# Patient Record
Sex: Male | Born: 1960 | Race: White | Hispanic: No | Marital: Married | Smoking: Never smoker
Health system: Southern US, Community
[De-identification: ages and names within clinical notes are randomized; demographics above are authoritative.]

## PROBLEM LIST (undated history)

## (undated) DIAGNOSIS — K2270 Barrett's esophagus without dysplasia: Secondary | ICD-10-CM

## (undated) DIAGNOSIS — I514 Myocarditis, unspecified: Secondary | ICD-10-CM

## (undated) DIAGNOSIS — F909 Attention-deficit hyperactivity disorder, unspecified type: Secondary | ICD-10-CM

## (undated) DIAGNOSIS — I10 Essential (primary) hypertension: Secondary | ICD-10-CM

## (undated) DIAGNOSIS — K449 Diaphragmatic hernia without obstruction or gangrene: Secondary | ICD-10-CM

## (undated) DIAGNOSIS — E785 Hyperlipidemia, unspecified: Secondary | ICD-10-CM

## (undated) DIAGNOSIS — D649 Anemia, unspecified: Secondary | ICD-10-CM

## (undated) HISTORY — PX: EYE SURGERY: SHX253

## (undated) HISTORY — PX: OTHER SURGICAL HISTORY: SHX169

## (undated) HISTORY — DX: Anemia, unspecified: D64.9

## (undated) HISTORY — PX: ABSCESS DRAIN LIVER PERC (ARMC HX): HXRAD1235

## (undated) HISTORY — PX: CARDIAC CATHETERIZATION: SHX172

## (undated) HISTORY — PX: RETINAL DETACHMENT SURGERY: SHX105

## (undated) HISTORY — DX: Hyperlipidemia, unspecified: E78.5

## (undated) HISTORY — DX: Myocarditis, unspecified: I51.4

## (undated) HISTORY — DX: Barrett's esophagus without dysplasia: K22.70

## (undated) HISTORY — DX: Diaphragmatic hernia without obstruction or gangrene: K44.9

## (undated) HISTORY — DX: Essential (primary) hypertension: I10

## (undated) HISTORY — PX: HYDROCELE EXCISION / REPAIR: SUR1145

## (undated) HISTORY — DX: Attention-deficit hyperactivity disorder, unspecified type: F90.9

---

## 1998-09-30 ENCOUNTER — Ambulatory Visit (HOSPITAL_BASED_OUTPATIENT_CLINIC_OR_DEPARTMENT_OTHER): Admission: RE | Admit: 1998-09-30 | Discharge: 1998-09-30 | Payer: Self-pay | Admitting: Urology

## 1999-04-28 ENCOUNTER — Inpatient Hospital Stay (HOSPITAL_COMMUNITY): Admission: EM | Admit: 1999-04-28 | Discharge: 1999-04-30 | Payer: Self-pay | Admitting: Emergency Medicine

## 1999-04-28 ENCOUNTER — Encounter: Payer: Self-pay | Admitting: Emergency Medicine

## 2003-10-31 ENCOUNTER — Ambulatory Visit (HOSPITAL_COMMUNITY): Admission: RE | Admit: 2003-10-31 | Discharge: 2003-11-01 | Payer: Self-pay | Admitting: Ophthalmology

## 2003-12-16 ENCOUNTER — Emergency Department (HOSPITAL_COMMUNITY): Admission: EM | Admit: 2003-12-16 | Discharge: 2003-12-16 | Payer: Self-pay | Admitting: Emergency Medicine

## 2003-12-26 ENCOUNTER — Encounter: Payer: Self-pay | Admitting: Internal Medicine

## 2004-01-14 ENCOUNTER — Encounter (INDEPENDENT_AMBULATORY_CARE_PROVIDER_SITE_OTHER): Payer: Self-pay | Admitting: *Deleted

## 2004-01-14 ENCOUNTER — Ambulatory Visit (HOSPITAL_COMMUNITY): Admission: RE | Admit: 2004-01-14 | Discharge: 2004-01-14 | Payer: Self-pay | Admitting: Internal Medicine

## 2004-01-14 ENCOUNTER — Encounter: Payer: Self-pay | Admitting: Internal Medicine

## 2004-03-31 ENCOUNTER — Ambulatory Visit: Payer: Self-pay | Admitting: Internal Medicine

## 2004-04-08 ENCOUNTER — Ambulatory Visit: Payer: Self-pay

## 2005-08-03 ENCOUNTER — Ambulatory Visit: Payer: Self-pay | Admitting: Internal Medicine

## 2006-05-09 ENCOUNTER — Ambulatory Visit: Payer: Self-pay | Admitting: Internal Medicine

## 2006-05-09 LAB — CONVERTED CEMR LAB
ALT: 31 units/L (ref 0–40)
AST: 24 units/L (ref 0–37)
Albumin: 3.5 g/dL (ref 3.5–5.2)
Alkaline Phosphatase: 51 units/L (ref 39–117)
BUN: 21 mg/dL (ref 6–23)
CO2: 31 meq/L (ref 19–32)
Calcium: 8.7 mg/dL (ref 8.4–10.5)
Chloride: 100 meq/L (ref 96–112)
Chol/HDL Ratio, serum: 4.9
Cholesterol: 138 mg/dL (ref 0–200)
Creatinine, Ser: 1 mg/dL (ref 0.4–1.5)
Creatinine,U: 168.1 mg/dL
GFR calc non Af Amer: 86 mL/min
Glomerular Filtration Rate, Af Am: 104 mL/min/{1.73_m2}
Glucose, Bld: 111 mg/dL — ABNORMAL HIGH (ref 70–99)
HCT: 41.1 % (ref 39.0–52.0)
HDL: 28.4 mg/dL — ABNORMAL LOW (ref >39.0)
Hemoglobin: 13.7 g/dL (ref 13.0–17.0)
Hgb A1c MFr Bld: 7.5 % — ABNORMAL HIGH (ref 4.6–6.0)
LDL Cholesterol: 82 mg/dL (ref 0–99)
MCHC: 33.2 g/dL (ref 30.0–36.0)
MCV: 90.6 fL (ref 78.0–100.0)
Microalb Creat Ratio: 2.4 mg/g (ref 0.0–30.0)
Microalb, Ur: 0.4 mg/dL (ref 0.0–1.9)
Platelets: 347 10*3/uL (ref 150–400)
Potassium: 3.9 meq/L (ref 3.5–5.1)
RBC: 4.54 M/uL (ref 4.22–5.81)
RDW: 13.1 % (ref 11.5–14.6)
Sodium: 139 meq/L (ref 135–145)
TSH: 1.48 microintl units/mL (ref 0.35–5.50)
Total Bilirubin: 0.5 mg/dL (ref 0.3–1.2)
Total Protein: 6.6 g/dL (ref 6.0–8.3)
Triglyceride fasting, serum: 136 mg/dL (ref 0–149)
VLDL: 27 mg/dL (ref 0–40)
WBC: 9.8 10*3/uL (ref 4.5–10.5)

## 2006-07-14 ENCOUNTER — Ambulatory Visit: Payer: Self-pay | Admitting: Internal Medicine

## 2006-11-02 ENCOUNTER — Telehealth: Payer: Self-pay | Admitting: Internal Medicine

## 2006-11-03 DIAGNOSIS — I10 Essential (primary) hypertension: Secondary | ICD-10-CM | POA: Insufficient documentation

## 2006-11-14 ENCOUNTER — Encounter (INDEPENDENT_AMBULATORY_CARE_PROVIDER_SITE_OTHER): Payer: Self-pay | Admitting: *Deleted

## 2007-01-13 ENCOUNTER — Telehealth: Payer: Self-pay | Admitting: Internal Medicine

## 2007-02-06 ENCOUNTER — Encounter (INDEPENDENT_AMBULATORY_CARE_PROVIDER_SITE_OTHER): Payer: Self-pay | Admitting: *Deleted

## 2007-03-13 ENCOUNTER — Ambulatory Visit: Payer: Self-pay | Admitting: Internal Medicine

## 2007-03-13 DIAGNOSIS — IMO0002 Reserved for concepts with insufficient information to code with codable children: Secondary | ICD-10-CM | POA: Insufficient documentation

## 2007-03-13 DIAGNOSIS — E1165 Type 2 diabetes mellitus with hyperglycemia: Secondary | ICD-10-CM

## 2007-03-13 DIAGNOSIS — D239 Other benign neoplasm of skin, unspecified: Secondary | ICD-10-CM | POA: Insufficient documentation

## 2007-03-13 DIAGNOSIS — E1151 Type 2 diabetes mellitus with diabetic peripheral angiopathy without gangrene: Secondary | ICD-10-CM

## 2007-03-31 ENCOUNTER — Encounter (INDEPENDENT_AMBULATORY_CARE_PROVIDER_SITE_OTHER): Payer: Self-pay | Admitting: *Deleted

## 2007-04-04 ENCOUNTER — Encounter: Payer: Self-pay | Admitting: Internal Medicine

## 2007-04-05 ENCOUNTER — Ambulatory Visit: Payer: Self-pay | Admitting: Internal Medicine

## 2007-04-05 DIAGNOSIS — I428 Other cardiomyopathies: Secondary | ICD-10-CM

## 2007-04-07 LAB — CONVERTED CEMR LAB
ALT: 32 units/L (ref 0–53)
AST: 22 units/L (ref 0–37)
Albumin: 3.4 g/dL — ABNORMAL LOW (ref 3.5–5.2)
Alkaline Phosphatase: 65 units/L (ref 39–117)
BUN: 16 mg/dL (ref 6–23)
Basophils Absolute: 0.1 10*3/uL (ref 0.0–0.1)
Basophils Relative: 0.8 % (ref 0.0–1.0)
Bilirubin, Direct: 0.1 mg/dL (ref 0.0–0.3)
CO2: 30 meq/L (ref 19–32)
Calcium: 9.2 mg/dL (ref 8.4–10.5)
Chloride: 104 meq/L (ref 96–112)
Cholesterol: 121 mg/dL (ref 0–200)
Creatinine, Ser: 0.9 mg/dL (ref 0.4–1.5)
Creatinine,U: 139.5 mg/dL
Eosinophils Absolute: 0.5 10*3/uL (ref 0.0–0.6)
Eosinophils Relative: 5.9 % — ABNORMAL HIGH (ref 0.0–5.0)
GFR calc Af Amer: 117 mL/min
GFR calc non Af Amer: 97 mL/min
Glucose, Bld: 164 mg/dL — ABNORMAL HIGH (ref 70–99)
HCT: 39.5 % (ref 39.0–52.0)
HDL: 25 mg/dL — ABNORMAL LOW (ref >39.0)
Hemoglobin: 13.5 g/dL (ref 13.0–17.0)
Hgb A1c MFr Bld: 8.3 % — ABNORMAL HIGH (ref 4.6–6.0)
Iron: 71 ug/dL (ref 42–165)
LDL Cholesterol: 77 mg/dL (ref 0–99)
Lymphocytes Relative: 24.4 % (ref 12.0–46.0)
MCHC: 34.1 g/dL (ref 30.0–36.0)
MCV: 88.3 fL (ref 78.0–100.0)
Microalb Creat Ratio: 2.9 mg/g (ref 0.0–30.0)
Microalb, Ur: 0.4 mg/dL (ref 0.0–1.9)
Monocytes Absolute: 0.6 10*3/uL (ref 0.2–0.7)
Monocytes Relative: 7.6 % (ref 3.0–11.0)
Neutro Abs: 4.9 10*3/uL (ref 1.4–7.7)
Neutrophils Relative %: 61.3 % (ref 43.0–77.0)
Platelets: 296 10*3/uL (ref 150–400)
Potassium: 4 meq/L (ref 3.5–5.1)
RBC: 4.47 M/uL (ref 4.22–5.81)
RDW: 14.2 % (ref 11.5–14.6)
Sodium: 143 meq/L (ref 135–145)
TSH: 1.36 microintl units/mL (ref 0.35–5.50)
Total Bilirubin: 0.5 mg/dL (ref 0.3–1.2)
Total CHOL/HDL Ratio: 4.8
Total Protein: 6.7 g/dL (ref 6.0–8.3)
Triglycerides: 94 mg/dL (ref 0–149)
VLDL: 19 mg/dL (ref 0–40)
WBC: 8.1 10*3/uL (ref 4.5–10.5)

## 2007-06-08 ENCOUNTER — Ambulatory Visit: Payer: Self-pay | Admitting: Internal Medicine

## 2007-06-20 ENCOUNTER — Encounter (INDEPENDENT_AMBULATORY_CARE_PROVIDER_SITE_OTHER): Payer: Self-pay | Admitting: *Deleted

## 2007-06-21 ENCOUNTER — Telehealth (INDEPENDENT_AMBULATORY_CARE_PROVIDER_SITE_OTHER): Payer: Self-pay | Admitting: *Deleted

## 2007-07-04 ENCOUNTER — Ambulatory Visit: Payer: Self-pay | Admitting: Internal Medicine

## 2007-07-07 ENCOUNTER — Telehealth (INDEPENDENT_AMBULATORY_CARE_PROVIDER_SITE_OTHER): Payer: Self-pay | Admitting: *Deleted

## 2007-07-07 LAB — CONVERTED CEMR LAB: Hgb A1c MFr Bld: 8.4 % — ABNORMAL HIGH (ref 4.6–6.0)

## 2007-07-19 ENCOUNTER — Telehealth (INDEPENDENT_AMBULATORY_CARE_PROVIDER_SITE_OTHER): Payer: Self-pay | Admitting: *Deleted

## 2007-07-26 ENCOUNTER — Encounter: Payer: Self-pay | Admitting: Internal Medicine

## 2007-08-10 ENCOUNTER — Telehealth (INDEPENDENT_AMBULATORY_CARE_PROVIDER_SITE_OTHER): Payer: Self-pay | Admitting: *Deleted

## 2007-08-10 ENCOUNTER — Emergency Department (HOSPITAL_COMMUNITY): Admission: EM | Admit: 2007-08-10 | Discharge: 2007-08-10 | Payer: Self-pay | Admitting: Emergency Medicine

## 2007-08-14 ENCOUNTER — Ambulatory Visit: Payer: Self-pay | Admitting: Internal Medicine

## 2007-08-14 DIAGNOSIS — F909 Attention-deficit hyperactivity disorder, unspecified type: Secondary | ICD-10-CM | POA: Insufficient documentation

## 2007-08-30 ENCOUNTER — Ambulatory Visit: Payer: Self-pay | Admitting: *Deleted

## 2007-08-30 ENCOUNTER — Encounter: Payer: Self-pay | Admitting: Internal Medicine

## 2007-09-07 ENCOUNTER — Telehealth: Payer: Self-pay | Admitting: Internal Medicine

## 2007-10-03 ENCOUNTER — Ambulatory Visit: Payer: Self-pay | Admitting: Internal Medicine

## 2007-10-10 LAB — CONVERTED CEMR LAB
BUN: 17 mg/dL (ref 6–23)
CO2: 30 meq/L (ref 19–32)
Calcium: 8.9 mg/dL (ref 8.4–10.5)
Chloride: 104 meq/L (ref 96–112)
Creatinine, Ser: 1.1 mg/dL (ref 0.4–1.5)
GFR calc Af Amer: 93 mL/min
GFR calc non Af Amer: 77 mL/min
Glucose, Bld: 193 mg/dL — ABNORMAL HIGH (ref 70–99)
Hgb A1c MFr Bld: 8 % — ABNORMAL HIGH (ref 4.6–6.0)
Potassium: 3.4 meq/L — ABNORMAL LOW (ref 3.5–5.1)
Sodium: 141 meq/L (ref 135–145)

## 2007-11-06 ENCOUNTER — Telehealth (INDEPENDENT_AMBULATORY_CARE_PROVIDER_SITE_OTHER): Payer: Self-pay | Admitting: *Deleted

## 2008-02-22 ENCOUNTER — Telehealth: Payer: Self-pay | Admitting: Internal Medicine

## 2008-02-22 ENCOUNTER — Telehealth (INDEPENDENT_AMBULATORY_CARE_PROVIDER_SITE_OTHER): Payer: Self-pay | Admitting: *Deleted

## 2008-03-19 ENCOUNTER — Telehealth (INDEPENDENT_AMBULATORY_CARE_PROVIDER_SITE_OTHER): Payer: Self-pay | Admitting: *Deleted

## 2008-04-29 ENCOUNTER — Telehealth (INDEPENDENT_AMBULATORY_CARE_PROVIDER_SITE_OTHER): Payer: Self-pay | Admitting: *Deleted

## 2008-04-29 ENCOUNTER — Ambulatory Visit: Payer: Self-pay | Admitting: Family Medicine

## 2008-07-01 ENCOUNTER — Telehealth (INDEPENDENT_AMBULATORY_CARE_PROVIDER_SITE_OTHER): Payer: Self-pay | Admitting: *Deleted

## 2008-07-25 ENCOUNTER — Encounter (INDEPENDENT_AMBULATORY_CARE_PROVIDER_SITE_OTHER): Payer: Self-pay | Admitting: *Deleted

## 2008-09-03 ENCOUNTER — Encounter (INDEPENDENT_AMBULATORY_CARE_PROVIDER_SITE_OTHER): Payer: Self-pay | Admitting: *Deleted

## 2008-09-10 ENCOUNTER — Telehealth (INDEPENDENT_AMBULATORY_CARE_PROVIDER_SITE_OTHER): Payer: Self-pay | Admitting: *Deleted

## 2008-10-07 ENCOUNTER — Encounter (INDEPENDENT_AMBULATORY_CARE_PROVIDER_SITE_OTHER): Payer: Self-pay | Admitting: *Deleted

## 2008-10-10 ENCOUNTER — Telehealth (INDEPENDENT_AMBULATORY_CARE_PROVIDER_SITE_OTHER): Payer: Self-pay | Admitting: *Deleted

## 2008-10-15 ENCOUNTER — Encounter (INDEPENDENT_AMBULATORY_CARE_PROVIDER_SITE_OTHER): Payer: Self-pay | Admitting: *Deleted

## 2008-10-22 ENCOUNTER — Encounter (INDEPENDENT_AMBULATORY_CARE_PROVIDER_SITE_OTHER): Payer: Self-pay | Admitting: *Deleted

## 2008-10-30 ENCOUNTER — Telehealth (INDEPENDENT_AMBULATORY_CARE_PROVIDER_SITE_OTHER): Payer: Self-pay | Admitting: *Deleted

## 2008-11-04 ENCOUNTER — Ambulatory Visit: Payer: Self-pay | Admitting: Internal Medicine

## 2008-11-04 DIAGNOSIS — E785 Hyperlipidemia, unspecified: Secondary | ICD-10-CM

## 2008-11-11 ENCOUNTER — Telehealth (INDEPENDENT_AMBULATORY_CARE_PROVIDER_SITE_OTHER): Payer: Self-pay | Admitting: *Deleted

## 2008-11-14 ENCOUNTER — Encounter: Payer: Self-pay | Admitting: Internal Medicine

## 2009-02-13 ENCOUNTER — Telehealth (INDEPENDENT_AMBULATORY_CARE_PROVIDER_SITE_OTHER): Payer: Self-pay | Admitting: *Deleted

## 2009-02-13 ENCOUNTER — Ambulatory Visit: Payer: Self-pay | Admitting: Internal Medicine

## 2009-05-20 ENCOUNTER — Telehealth: Payer: Self-pay | Admitting: Internal Medicine

## 2009-05-21 ENCOUNTER — Telehealth (INDEPENDENT_AMBULATORY_CARE_PROVIDER_SITE_OTHER): Payer: Self-pay | Admitting: *Deleted

## 2009-07-22 ENCOUNTER — Ambulatory Visit: Payer: Self-pay | Admitting: Internal Medicine

## 2009-07-24 ENCOUNTER — Ambulatory Visit: Payer: Self-pay | Admitting: Internal Medicine

## 2009-07-28 LAB — CONVERTED CEMR LAB
ALT: 25 units/L (ref 0–53)
AST: 25 units/L (ref 0–37)
BUN: 16 mg/dL (ref 6–23)
CO2: 30 meq/L (ref 19–32)
Calcium: 8.7 mg/dL (ref 8.4–10.5)
Chloride: 102 meq/L (ref 96–112)
Cholesterol: 153 mg/dL (ref 0–200)
Creatinine, Ser: 0.7 mg/dL (ref 0.4–1.5)
Creatinine,U: 293.3 mg/dL
GFR calc non Af Amer: 127.69 mL/min (ref >60)
Glucose, Bld: 134 mg/dL — ABNORMAL HIGH (ref 70–99)
HDL: 41.8 mg/dL (ref >39.00)
Hgb A1c MFr Bld: 7.1 % — ABNORMAL HIGH (ref 4.6–6.5)
LDL Cholesterol: 81 mg/dL (ref 0–99)
Microalb Creat Ratio: 7.8 mg/g (ref 0.0–30.0)
Microalb, Ur: 2.3 mg/dL — ABNORMAL HIGH (ref 0.0–1.9)
Potassium: 3.6 meq/L (ref 3.5–5.1)
Sodium: 138 meq/L (ref 135–145)
Total CHOL/HDL Ratio: 4
Triglycerides: 150 mg/dL — ABNORMAL HIGH (ref 0.0–149.0)
VLDL: 30 mg/dL (ref 0.0–40.0)

## 2009-10-22 ENCOUNTER — Ambulatory Visit: Payer: Self-pay | Admitting: Internal Medicine

## 2009-10-23 ENCOUNTER — Encounter (INDEPENDENT_AMBULATORY_CARE_PROVIDER_SITE_OTHER): Payer: Self-pay | Admitting: *Deleted

## 2009-11-04 ENCOUNTER — Telehealth (INDEPENDENT_AMBULATORY_CARE_PROVIDER_SITE_OTHER): Payer: Self-pay | Admitting: *Deleted

## 2009-11-06 ENCOUNTER — Encounter (INDEPENDENT_AMBULATORY_CARE_PROVIDER_SITE_OTHER): Payer: Self-pay | Admitting: *Deleted

## 2009-11-06 ENCOUNTER — Ambulatory Visit: Payer: Self-pay | Admitting: Internal Medicine

## 2009-11-07 ENCOUNTER — Ambulatory Visit: Payer: Self-pay | Admitting: Internal Medicine

## 2009-11-07 LAB — CONVERTED CEMR LAB
Basophils Absolute: 0.1 10*3/uL (ref 0.0–0.1)
Basophils Relative: 1 % (ref 0–1)
Eosinophils Absolute: 0.5 10*3/uL (ref 0.0–0.7)
Eosinophils Relative: 5 % (ref 0–5)
HCT: 36.2 % — ABNORMAL LOW (ref 39.0–52.0)
Hemoglobin: 11.4 g/dL — ABNORMAL LOW (ref 13.0–17.0)
Hgb A1c MFr Bld: 7.1 % — ABNORMAL HIGH (ref <5.7)
Lymphocytes Relative: 25 % (ref 12–46)
Lymphs Abs: 2.7 10*3/uL (ref 0.7–4.0)
MCHC: 31.5 g/dL (ref 30.0–36.0)
MCV: 81.3 fL (ref 78.0–100.0)
Monocytes Absolute: 0.6 10*3/uL (ref 0.1–1.0)
Monocytes Relative: 5 % (ref 3–12)
Neutro Abs: 6.8 10*3/uL (ref 1.7–7.7)
Neutrophils Relative %: 64 % (ref 43–77)
Platelets: 361 10*3/uL (ref 150–400)
RBC: 4.45 M/uL (ref 4.22–5.81)
RDW: 16.4 % — ABNORMAL HIGH (ref 11.5–15.5)
WBC: 10.5 10*3/uL (ref 4.0–10.5)

## 2009-11-12 ENCOUNTER — Telehealth (INDEPENDENT_AMBULATORY_CARE_PROVIDER_SITE_OTHER): Payer: Self-pay | Admitting: *Deleted

## 2009-11-13 ENCOUNTER — Ambulatory Visit: Payer: Self-pay | Admitting: Internal Medicine

## 2009-11-13 LAB — CONVERTED CEMR LAB
Basophils Absolute: 0 10*3/uL (ref 0.0–0.1)
Basophils Relative: 0.4 % (ref 0.0–3.0)
Eosinophils Absolute: 0.5 10*3/uL (ref 0.0–0.7)
Iron: 36 ug/dL — ABNORMAL LOW (ref 42–165)
Lymphocytes Relative: 34.4 % (ref 12.0–46.0)
MCHC: 32.7 g/dL (ref 30.0–36.0)
MCV: 80.3 fL (ref 78.0–100.0)
Monocytes Absolute: 0.7 10*3/uL (ref 0.1–1.0)
Neutrophils Relative %: 51.1 % (ref 43.0–77.0)
RBC: 4.34 M/uL (ref 4.22–5.81)
RDW: 17.5 % — ABNORMAL HIGH (ref 11.5–14.6)

## 2009-11-16 ENCOUNTER — Telehealth (INDEPENDENT_AMBULATORY_CARE_PROVIDER_SITE_OTHER): Payer: Self-pay | Admitting: *Deleted

## 2009-11-16 DIAGNOSIS — D509 Iron deficiency anemia, unspecified: Secondary | ICD-10-CM | POA: Insufficient documentation

## 2009-11-18 ENCOUNTER — Encounter (INDEPENDENT_AMBULATORY_CARE_PROVIDER_SITE_OTHER): Payer: Self-pay | Admitting: *Deleted

## 2009-12-11 ENCOUNTER — Encounter: Payer: Self-pay | Admitting: Internal Medicine

## 2009-12-30 ENCOUNTER — Telehealth: Payer: Self-pay | Admitting: Internal Medicine

## 2010-01-12 ENCOUNTER — Telehealth: Payer: Self-pay | Admitting: Internal Medicine

## 2010-02-09 ENCOUNTER — Telehealth: Payer: Self-pay | Admitting: Internal Medicine

## 2010-04-09 ENCOUNTER — Telehealth: Payer: Self-pay | Admitting: Internal Medicine

## 2010-06-23 NOTE — Procedures (Signed)
Summary: EGD   EGD  Procedure date:  01/14/2004  Findings:      Location: [facility]   Patient Name: Raymond Pugh, Raymond Pugh MRN: [id number] Procedure Procedures: Panendoscopy (EGD) CPT: 43235.    with multiple biopsies. CPT: T6462574. There were greater than 10 biopsies taken.    with [facility] Dilation of the Esophagus Personnel: Endoscopist: Iva Boop, MD, [facility].  Exam Location: Exam performed in Endoscopy Suite. Outpatient  Patient Consent: Procedure, Alternatives, Risks and Benefits discussed, consent obtained,  Indications Symptoms: Dysphagia.  History  Current Medications: Patient is not currently taking Coumadin.  Pre-Exam Physical: Performed Jan 14, 2004  Cardio-pulmonary exam, HEENT exam, Abdominal exam, Extremity exam, Mental status exam WNL.  Exam Exam Info: Maximum depth of insertion Duodenum, intended Duodenum. Patient position: on left side. Gastric retroflexion performed. Images taken. ASA Classification: III. Tolerance: excellent.  Sedation Meds: Patient assessed and found to be appropriate for moderate (conscious) sedation. Cetacaine Spray 2 sprays given aerosolized. Versed 8 mg. given IV. Fentanyl 100 mcg. given IV.  Monitoring: BP and pulse monitoring done. Oximetry used. Supplemental O2 given  Fluoroscopy: Fluoroscopy was not used.  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 Proximal margin 26 cm from mouth,  Z Line 38 cm from mouth, Length of Barrett's 12 cm. No inflammation present. A retroflex image was taken. Biopsy/Barrett's taken. ICD9: Barrett's: 530.2. Comment: 4 quadrant biopsies every 2 cm from 38 to 26 cm.  - Dilation: Distal Esophagus. for dysphagia without stricture. Maloney dilator used, Diameter: 18 mm, Minimal Resistance, Minimal Heme present on extraction. Patient tolerance excellent.  HIATAL HERNIA: Diaphragm 40 cm from mouth. Z-line/GE Junction 38 cm from mouth. 2 cms. in length. ICD9: Hernia, Hiatal: 553.3. -  Normal: Fundus to Duodenal 2nd Portion.   Assessment Abnormal examination, see findings above.  Diagnoses: 530.2: Barrett's.  553.3: Hernia, Hiatal.   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 NO OBVIOUS STRICTURE SEEN. HIS HEARTBURN IS GONE ON PROTONIX 40 MG/DAY Events  Unplanned Intervention: No unplanned interventions were required.  Plans Medication(s): Continue current medications.  Patient Education: Patient given standard instructions for: Barrett's. Reflux.  Comments: CONTINUE PROTONIX  Disposition: After procedure patient sent to recovery.  Scheduling: Call office for appointment, to Iva Boop, MD, [facility], 4 WEEKS    cc: The Patient     Delorse Lek, MD    This report was created from the original endoscopy report, which was reviewed and signed by the above listed endoscopist.

## 2010-06-23 NOTE — Progress Notes (Signed)
Summary: refill  Phone Note Refill Request Call back at [phone] Message from:  Fax from Pharmacy on [facility] [address]   Refills Requested: Medication #1:  ALPRAZOLAM 1 MG  TABS 1/2 to 1 by mouth once daily as needed anxiety   Last Refilled: 07/22/2009 Initial call taken by: Army Fossa CMA,  December 30, 2009 12:42 PM  Follow-up for Phone Call        ok 90 and 1 RF Jose E. Paz MD  December 30, 2009 4:01 PM     Prescriptions: ALPRAZOLAM 1 MG  TABS (ALPRAZOLAM) 1/2 to 1 by mouth once daily as needed anxiety  #90 x 1   Entered by:   Army Fossa CMA   Authorized by:   Nolon Rod. Paz MD   Signed by:   Army Fossa CMA on 12/30/2009   Method used:   Printed then faxed to ...       [facility] [address] #[id number]* (retail)       [address]       [place], [place]  [address]       Ph: [phone]       Fax: [phone]   RxID:   [id number]

## 2010-06-23 NOTE — Assessment & Plan Note (Signed)
Summary: roa per pt..lh   Vital Signs:  Patient profile:   50 year old male Height:      71 inches Weight:      249 pounds BMI:     34.85 Pulse rate:   76 / minute BP sitting:   122 / 80  Vitals Entered By: Shary Decamp (July 22, 2009 2:55 PM) CC: rov Comments  - blood sugar average at home 80-120  - ? increase adderall Shary Decamp  July 22, 2009 3:03 PM    History of Present Illness: we change several of his medications to generic at the time of the last office visit here for follow-up, doing well  Hypertension--  rarely gets ambulatory BPs but usually ok  High Cholesterol-- good medication compliance , no s/e that he can tell   Diabetes-- good medication compliance , diet improved, lost wt    ADHD-feel like his symptoms are not completely well controlled, still easily distracted; increase dose of medication?  Current Medications (verified): 1)  Fenofibrate Micronized 134 Mg Caps (Fenofibrate Micronized) .Marland Kitchen.. 1 By Mouth Once Daily 2)  Pravachol 40 Mg Tabs (Pravastatin Sodium) .Marland Kitchen.. 1 By Mouth Once Daily 3)  Glipizide 5 Mg Tb24 (Glipizide) .... Take 1 Tablet By Mouth Twice A Day 4)  Glucophage 500 Mg Tabs (Metformin Hcl) .... 2 By Mouth Two Times A Day 5)  Januvia 100 Mg Tabs (Sitagliptin Phosphate) .Marland Kitchen.. 1 By Mouth Qd 6)  Actos 45 Mg Tabs (Pioglitazone Hcl) .... Take 1 Tablet By Mouth Once A Day 7)  Altace 10 Mg Caps (Ramipril) .Marland Kitchen.. 1 By Mouth Two Times A Day 8)  Hydrochlorothiazide 25 Mg Tabs (Hydrochlorothiazide) .Marland Kitchen.. 1 By Mouth Once Daily 9)  Viagra 50 Mg Tabs (Sildenafil Citrate) 10)  Omeprazole 40 Mg Cpdr (Omeprazole) .Marland Kitchen.. 1 By Mouth Once Daily 11)  Alprazolam 1 Mg  Tabs (Alprazolam) .... 1/2 To 1 By Mouth Once Daily As Needed Anxiety 12)  Metoprolol Tartrate 100 Mg  Tabs (Metoprolol Tartrate) .Marland Kitchen.. 1 By Mouth Bid 13)  Adderall Xr 30 Mg  Cp24 (Amphetamine-Dextroamphetamine) .Marland Kitchen.. 1 By Mouth A Day  As Needed  Allergies (verified): No Known Drug Allergies  Past  History:  Past Medical History: Hypertension High Cholesterol Diabetes mellitus, type II Radiokeratotomy 50 y/o  retinal detachment , s/p repair 2008  Hyperlipidemia  Past Surgical History: Reviewed history from 11/04/2008 and no changes required. Hydrocele repair Radiokeratotomy 50 y/o  retinal detachment , s/p repair 2008   Family History:  Diabetes-- F  Hypertension-- F M  Family History Other cancer-Bone Family History of Cardiovascular disorder colon ca--no prostate ca--no  Social History: Reviewed history from 11/04/2008 and no changes required. Married Alcohol use--yes-occ Tobacco-- no 4  kids currently unemployed going to school school [employer]  Review of Systems CV:  Denies chest pain or discomfort and swelling of feet. Resp:  Denies cough and shortness of breath. GI:  Denies bloody stools.  Physical Exam  General:  alert and well-developed.   Lungs:  normal respiratory effort, no intercostal retractions, no accessory muscle use, and normal breath sounds.   Heart:  normal rate, regular rhythm, and no murmur.   Pulses:  normal pedal pulses bilaterally Extremities:  no extremity edema  Diabetes Management Exam:    Foot Exam (with socks and/or shoes not present):       Sensory-Pinprick/Light touch:          Left medial foot (L-4): normal  Left dorsal foot (L-5): normal          Left lateral foot (S-1): normal          Right medial foot (L-4): normal          Right dorsal foot (L-5): normal          Right lateral foot (S-1): normal       Sensory-Monofilament:          Left foot: normal          Right foot: normal       Inspection:          Left foot: normal          Right foot: normal       Nails:          Left foot: too long          Right foot: too long   Impression & Recommendations:  Problem # 1:  HYPERLIPIDEMIA (ICD-272.4) on generics, apparently doing well labs His updated medication list for this problem includes:    Fenofibrate  Micronized 134 Mg Caps (Fenofibrate micronized) .Marland Kitchen... 1 by mouth once daily    Pravachol 40 Mg Tabs (Pravastatin sodium) .Marland Kitchen... 1 by mouth once daily  Labs Reviewed: SGOT: 22 (04/05/2007)   SGPT: 32 (04/05/2007)   HDL:25.0 (04/05/2007), 28.4 (05/09/2006)  LDL:77 (04/05/2007), 82 (05/09/2006)  Chol:121 (04/05/2007), 138 (05/09/2006)  Trig:94 (04/05/2007), 136 (05/09/2006)  Problem # 2:  ADHD (ICD-314.01) increased dose from adderall 30 to  40 mg daily  Problem # 3:  DIABETES MELLITUS, TYPE II (ICD-250.00) apparently well controlled feet and eye care  discussed labs prraised for losing weight and eating better His updated medication list for this problem includes:    Glipizide 5 Mg Tb24 (Glipizide) .Marland Kitchen... Take 1 tablet by mouth twice a day    Glucophage 500 Mg Tabs (Metformin hcl) .Marland Kitchen... 2 by mouth two times a day    Januvia 100 Mg Tabs (Sitagliptin phosphate) .Marland Kitchen... 1 by mouth qd    Actos 45 Mg Tabs (Pioglitazone hcl) .Marland Kitchen... Take 1 tablet by mouth once a day    Altace 10 Mg Caps (Ramipril) .Marland Kitchen... 1 by mouth two times a day  Labs Reviewed: Creat: 1.1 (10/03/2007)    Reviewed HgBA1c results: 8.0 (10/03/2007)  8.4 (07/04/2007)  Problem # 4:  HYPERTENSION (ICD-401.9) well-controlled His updated medication list for this problem includes:    Altace 10 Mg Caps (Ramipril) .Marland Kitchen... 1 by mouth two times a day    Hydrochlorothiazide 25 Mg Tabs (Hydrochlorothiazide) .Marland Kitchen... 1 by mouth once daily    Metoprolol Tartrate 100 Mg Tabs (Metoprolol tartrate) .Marland Kitchen... 1 by mouth bid  BP today: 122/80 Prior BP: 112/70 (11/04/2008)  Labs Reviewed: K+: 3.4 (10/03/2007) Creat: : 1.1 (10/03/2007)   Chol: 121 (04/05/2007)   HDL: 25.0 (04/05/2007)   LDL: 77 (04/05/2007)   TG: 94 (04/05/2007)  Problem # 5:  he comes for office visit sporadically strongly encouraged to come back in 3 months and have his labs done   Complete Medication List: 1)  Fenofibrate Micronized 134 Mg Caps (Fenofibrate micronized) .Marland Kitchen.. 1 by  mouth once daily 2)  Pravachol 40 Mg Tabs (Pravastatin sodium) .Marland Kitchen.. 1 by mouth once daily 3)  Glipizide 5 Mg Tb24 (Glipizide) .... Take 1 tablet by mouth twice a day 4)  Glucophage 500 Mg Tabs (Metformin hcl) .... 2 by mouth two times a day 5)  Januvia 100 Mg Tabs (Sitagliptin phosphate) .Marland Kitchen.. 1 by mouth qd  6)  Actos 45 Mg Tabs (Pioglitazone hcl) .... Take 1 tablet by mouth once a day 7)  Altace 10 Mg Caps (Ramipril) .Marland Kitchen.. 1 by mouth two times a day 8)  Hydrochlorothiazide 25 Mg Tabs (Hydrochlorothiazide) .Marland Kitchen.. 1 by mouth once daily 9)  Viagra 50 Mg Tabs (Sildenafil citrate) 10)  Omeprazole 40 Mg Cpdr (Omeprazole) .Marland Kitchen.. 1 by mouth once daily 11)  Alprazolam 1 Mg Tabs (Alprazolam) .... 1/2 to 1 by mouth once daily as needed anxiety 12)  Metoprolol Tartrate 100 Mg Tabs (Metoprolol tartrate) .Marland Kitchen.. 1 by mouth bid 13)  Adderall Xr 20 Mg Xr24h-cap (Amphetamine-dextroamphetamine) .... 2  by mouth daily  Patient Instructions: 1)  came back fasting: 2)  FLP AST ALT  Dx High chol 3)  A1C microalb.  dx DM 4)  BMP  dx HTN 5)  Please schedule a follow-up appointment in 3 months .  Prescriptions: ADDERALL XR 20 MG XR24H-CAP (AMPHETAMINE-DEXTROAMPHETAMINE) 2  by mouth daily  #60 x 0   Entered and Authorized by:   Nolon Rod. Keyontay Stolz MD   Signed by:   Nolon Rod. Aleysha Meckler MD on 07/22/2009   Method used:   Print then Give to Patient   RxID:   [id number] METOPROLOL TARTRATE 100 MG  TABS (METOPROLOL TARTRATE) 1 by mouth bid  #180 x 1   Entered by:   Shary Decamp   Authorized by:   Nolon Rod. Curley Hogen MD   Signed by:   Shary Decamp on 07/22/2009   Method used:   Print then Give to Patient   RxID:   [id number] ALPRAZOLAM 1 MG  TABS (ALPRAZOLAM) 1/2 to 1 by mouth once daily as needed anxiety  #90 x 0   Entered by:   Shary Decamp   Authorized by:   Nolon Rod. Nolin Grell MD   Signed by:   Shary Decamp on 07/22/2009   Method used:   Print then Give to Patient   RxID:   [id number] OMEPRAZOLE 40 MG CPDR (OMEPRAZOLE) 1 by mouth  once daily  #90 x 1   Entered by:   Shary Decamp   Authorized by:   Nolon Rod. Emre Stock MD   Signed by:   Shary Decamp on 07/22/2009   Method used:   Print then Give to Patient   RxID:   [id number] HYDROCHLOROTHIAZIDE 25 MG TABS (HYDROCHLOROTHIAZIDE) 1 by mouth once daily  #90 x 1   Entered by:   Shary Decamp   Authorized by:   Nolon Rod. Liesl Simons MD   Signed by:   Shary Decamp on 07/22/2009   Method used:   Print then Give to Patient   RxID:   [id number] ALTACE 10 MG CAPS (RAMIPRIL) 1 by mouth two times a day  #180 x 1   Entered by:   Shary Decamp   Authorized by:   Nolon Rod. Pacey Altizer MD   Signed by:   Shary Decamp on 07/22/2009   Method used:   Print then Give to Patient   RxID:   [id number] ACTOS 45 MG TABS (PIOGLITAZONE HCL) Take 1 tablet by mouth once a day  #90 x 1   Entered by:   Shary Decamp   Authorized by:   Nolon Rod. Laylynn Campanella MD   Signed by:   Shary Decamp on 07/22/2009   Method used:   Print then Give to Patient   RxID:   [id number] JANUVIA 100 MG TABS (SITAGLIPTIN PHOSPHATE) 1 by mouth qd  #90 x 1   Entered by:  Shary Decamp   Authorized by:   Nolon Rod. Vignesh Willert MD   Signed by:   Shary Decamp on 07/22/2009   Method used:   Print then Give to Patient   RxID:   [id number] GLUCOPHAGE 500 MG TABS (METFORMIN HCL) 2 by mouth two times a day  #360 x 1   Entered by:   Shary Decamp   Authorized by:   Nolon Rod. Laneya Gasaway MD   Signed by:   Shary Decamp on 07/22/2009   Method used:   Print then Give to Patient   RxID:   [id number] GLIPIZIDE 5 MG TB24 (GLIPIZIDE) Take 1 tablet by mouth twice a day  #180 x 1   Entered by:   Shary Decamp   Authorized by:   Nolon Rod. Ladaysha Soutar MD   Signed by:   Shary Decamp on 07/22/2009   Method used:   Print then Give to Patient   RxID:   [id number] PRAVACHOL 40 MG TABS (PRAVASTATIN SODIUM) 1 by mouth once daily  #90 x 1   Entered by:   Shary Decamp   Authorized by:   Nolon Rod. Claudius Mich MD   Signed by:   Shary Decamp on 07/22/2009   Method used:   Print  then Give to Patient   RxID:   [id number] FENOFIBRATE MICRONIZED 134 MG CAPS (FENOFIBRATE MICRONIZED) 1 by mouth once daily  #90 x 1   Entered by:   Shary Decamp   Authorized by:   Nolon Rod. Jlynn Langille MD   Signed by:   Shary Decamp on 07/22/2009   Method used:   Print then Give to Patient   RxID:   [id number]

## 2010-06-23 NOTE — Progress Notes (Signed)
Summary: Labs  Phone Note Outgoing Call   Summary of Call: advise patient He has developed iron deficiency anemia, definitely needs to be evaluated by GI.  Please arrange a referral His diabetes is almost at goal.  Increase glipizide 5 mg to 2 tablets twice a day Jose E. Paz MD  November 16, 2009 10:51 AM   left message to call office................Marland KitchenFelecia Deloach CMA  November 17, 2009 11:58 AM   Follow-up for Phone Call        Pt is aware. Army Fossa CMA  November 18, 2009 3:54 PM   New Problems: ANEMIA (ICD-285.9)   New Problems: ANEMIA (ICD-285.9) New/Updated Medications: GLIPIZIDE 5 MG TB24 (GLIPIZIDE) 2 tabs by mouth two times a day. Prescriptions: GLIPIZIDE 5 MG TB24 (GLIPIZIDE) 2 tabs by mouth two times a day.  #120 x 0   Entered by:   Army Fossa CMA   Authorized by:   Nolon Rod. Paz MD   Signed by:   Army Fossa CMA on 11/18/2009   Method used:   Electronically to        [facility] #[id number]* (retail)       [address]       [place], [place]  [address]       Ph: [phone]       Fax: [phone]   RxID:   [id number]

## 2010-06-23 NOTE — Letter (Signed)
Summary: [place] No Show Letter  [facility] [place]  [address] [place], [place] [address]   Phone: [phone]  Fax: [phone]    10/23/2009 MRN: [id number]  [facility] [address] [place], [place]  [address]   Dear Mr. Temples,   Our records indicate that you missed your scheduled appointment with Dr. Drue Novel on 10-22-09 @ 9:20am.  Please contact this office to reschedule your appointment as soon as possible.  It is important that you keep your scheduled appointments with your physician, so we can provide you the best care possible.  Please be advised that there may be a charge for "no show" appointments.    Sincerely,    at [facility]

## 2010-06-23 NOTE — Progress Notes (Signed)
Summary: Alternative med-cannot afford  Phone Note Call from Patient Call back at Home Phone [phone] Call back at [phone](wife)   Caller: Spouse Summary of Call: Pts wife called and states that he is unemployed and they cannot afford the Actos. Would like to know if there is an alternative for him? Army Fossa CMA  February 09, 2010 10:09 AM   Follow-up for Phone Call        discontinue Actos increase glipizide to 10 mg ---2 tablets in the morning one in the afternoon , call #90, 3 refills Watch for low sugars office visit in 2 months Jose E. Paz MD  February 09, 2010 4:41 PM   Additional Follow-up for Phone Call Additional follow up Details #1::        Pt is aware of change, aware to watch for low BS.  Additional Follow-up by: Army Fossa CMA,  February 09, 2010 4:47 PM    New/Updated Medications: GLIPIZIDE 10 MG TABS (GLIPIZIDE) 2 tablets by mouth in the am, 1 tablet by mouth in the afternoon. Prescriptions: GLIPIZIDE 10 MG TABS (GLIPIZIDE) 2 tablets by mouth in the am, 1 tablet by mouth in the afternoon.  #90 x 3   Entered by:   Army Fossa CMA   Authorized by:   Nolon Rod. Paz MD   Signed by:   Army Fossa CMA on 02/09/2010   Method used:   Electronically to        [facility] #[id number]* (retail)       [address]       [place], [place]  [address]       Ph: [phone]       Fax: [phone]   RxID:   [id number]

## 2010-06-23 NOTE — Progress Notes (Signed)
Summary: REFILL   Phone Note Refill Request Message from:  Fax from Pharmacy on January 12, 2010 4:39 PM  Refills Requested: Medication #1:  ALPRAZOLAM 1 MG  TABS 1/2 to 1 by mouth once daily as needed anxiety [facility] [address] [facility] [id number]  Initial call taken by: Okey Regal [place],  January 12, 2010 4:39 PM  Follow-up for Phone Call        we just did [place] E. Paz MD  January 13, 2010 12:43 PM   Additional Follow-up for Phone Call Additional follow up Details #1::        Left message for pt to call back. Army Fossa CMA  January 13, 2010 12:52 PM  [email or web address] Barban  January 14, 2010 11:38 AM     Additional Follow-up for Phone Call Additional follow up Details #2::    Pt is aware- he will pick up rx from Pharmacy. Army Fossa CMA  January 15, 2010 11:51 AM  he also states he lost his Glipized bottle will send in new rx. Army Fossa CMA  January 15, 2010 11:52 AM    Prescriptions: GLIPIZIDE 5 MG TB24 (GLIPIZIDE) 2 tabs by mouth two times a day.  #120 x 0   Entered by:   Army Fossa CMA   Authorized by:   Nolon Rod. Paz MD   Signed by:   Army Fossa CMA on 01/15/2010   Method used:   Electronically to        [facility] #[id number]* (retail)       [address]       [place], [place]  [address]       Ph: [phone]       Fax: [phone]   RxID:   [id number]

## 2010-06-23 NOTE — Letter (Signed)
Summary: New Patient letter  [facility]  [address] [place], [place] [address]   Phone: [phone]  Fax: [phone]       11/18/2009 MRN: [id number]  Raymond Pugh [address] [place], [place]  [address]  Dear Raymond Pugh,  Welcome to the Gastroenterology Division at [facility].    You are scheduled to see Dr.  Jarold Motto on 12-12-09 at 10:00a.m. on the 3rd floor at [facility], [address]  We ask that you try to arrive at our office 15 minutes prior to your appointment time to allow for check-in.  We would like you to complete the enclosed self-administered evaluation form prior to your visit and bring it with you on the day of your appointment.  We will review it with you.  Also, please bring a complete list of all your medications or, if you prefer, bring the medication bottles and we will list them.  Please bring your insurance card so that we may make a copy of it.  If your insurance requires a referral to see a specialist, please bring your referral form from your primary care physician.  Co-payments are due at the time of your visit and may be paid by cash, check or credit card.     Your office visit will consist of a consult with your physician (includes a physical exam), any laboratory testing he/she may order, scheduling of any necessary diagnostic testing (e.g. x-ray, ultrasound, CT-scan), and scheduling of a procedure (e.g. Endoscopy, Colonoscopy) if required.  Please allow enough time on your schedule to allow for any/all of these possibilities.    If you cannot keep your appointment, please call [phone] to cancel or reschedule prior to your appointment date.  This allows [place] the opportunity to schedule an appointment for another patient in need of care.  If you do not cancel or reschedule by 5 p.m. the business day prior to your appointment date, you will be charged a $50.00 late cancellation/no-show fee.    Thank you for  choosing [facility] for your medical needs.  We appreciate the opportunity to care for you.  Please visit [place] at our website  to learn more about our practice.                     Sincerely,                                                             The Gastroenterology Division

## 2010-06-23 NOTE — Assessment & Plan Note (Signed)
Summary: 3 month roa/cdj   Vital Signs:  Patient profile:   50 year old male Height:      71 inches Weight:      249 pounds Temp:     97.8 degrees F oral Pulse rate:   83 / minute BP sitting:   110 / 68  (left arm)  Vitals Entered By: Jeremy Johann CMA (November 07, 2009 4:02 PM) CC: 3 month Comments --refills REVIEWED MED LIST, PATIENT AGREED DOSE AND INSTRUCTION CORRECT    History of Present Illness: routine office visit feels well  diet still good active , still going to school    Allergies (verified): No Known Drug Allergies  Past History:  Past Medical History: Reviewed history from 07/22/2009 and no changes required. Hypertension High Cholesterol Diabetes mellitus, type II Radiokeratotomy 50 y/o  retinal detachment , s/p repair 2008  Hyperlipidemia  Past Surgical History: Reviewed history from 11/04/2008 and no changes required. Hydrocele repair Radiokeratotomy 50 y/o  retinal detachment , s/p repair 2008   Social History: Reviewed history from 07/22/2009 and no changes required. Married Alcohol use--yes-occ Tobacco-- no 4  kids currently unemployed going to school school [employer]  Review of Systems       Hypertension-- ambulatory BPs great  High Cholesterol-- good medication compliance  Diabetes-- no recent ambulatory CBGs , one time he took double meds (by mistake) and his CBG was 65 , he was symptomatic. no other episodes     Physical Exam  General:  alert, well-developed, and overweight-appearing.   Lungs:  normal respiratory effort, no intercostal retractions, no accessory muscle use, and normal breath sounds.   Heart:  normal rate, regular rhythm, and no murmur.   Extremities:  no extremity edema   Impression & Recommendations:  Problem # 1:  ADHD (ICD-314.01) symptoms well controlled, he takes Adderall XR 20 twice a day, on the weekends usually takes one. I did a prescription for 60 but then he asked for a 90 day supply. I destroy the  prescription for 60 tablets.  Problem # 2:  DIABETES MELLITUS, TYPE II (ICD-250.00) due for labs Samples of Januvia and Actos provided His updated medication list for this problem includes:    Glipizide 5 Mg Tb24 (Glipizide) .Marland Kitchen... 2 tablets every am, 1 tablet every pm    Glucophage 500 Mg Tabs (Metformin hcl) .Marland Kitchen... 2 by mouth two times a day    Januvia 100 Mg Tabs (Sitagliptin phosphate) .Marland Kitchen... 1 by mouth qd    Actos 45 Mg Tabs (Pioglitazone hcl) .Marland Kitchen... Take 1 tablet by mouth once a day    Altace 10 Mg Caps (Ramipril) .Marland Kitchen... 1 by mouth two times a day  Labs Reviewed: Creat: 0.7 (07/24/2009)    Reviewed HgBA1c results: 7.1 (07/24/2009)  8.0 (10/03/2007)  Problem # 3:  HYPERLIPIDEMIA (ICD-272.4) well controlled the last time we checked His updated medication list for this problem includes:    Fenofibrate Micronized 134 Mg Caps (Fenofibrate micronized) .Marland Kitchen... 1 by mouth once daily    Pravachol 40 Mg Tabs (Pravastatin sodium) .Marland Kitchen... 1 by mouth once daily  Labs Reviewed: SGOT: 25 (07/24/2009)   SGPT: 25 (07/24/2009)   HDL:41.80 (07/24/2009), 25.0 (04/05/2007)  LDL:81 (07/24/2009), 77 (04/05/2007)  Chol:153 (07/24/2009), 121 (04/05/2007)  Trig:150.0 (07/24/2009), 94 (04/05/2007)  Problem # 4:  HYPERTENSION (ICD-401.9)  under excellent control His updated medication list for this problem includes:    Altace 10 Mg Caps (Ramipril) .Marland Kitchen... 1 by mouth two times a day    Hydrochlorothiazide 25  Mg Tabs (Hydrochlorothiazide) .Marland Kitchen... 1 by mouth once daily    Metoprolol Tartrate 100 Mg Tabs (Metoprolol tartrate) .Marland Kitchen... 1 by mouth bid  BP today: 110/68 Prior BP: 122/80 (07/22/2009)  Labs Reviewed: K+: 3.6 (07/24/2009) Creat: : 0.7 (07/24/2009)   Chol: 153 (07/24/2009)   HDL: 41.80 (07/24/2009)   LDL: 81 (07/24/2009)   TG: 150.0 (07/24/2009)  Orders: Venipuncture (06269)  Complete Medication List: 1)  Fenofibrate Micronized 134 Mg Caps (Fenofibrate micronized) .Marland Kitchen.. 1 by mouth once daily 2)   Pravachol 40 Mg Tabs (Pravastatin sodium) .Marland Kitchen.. 1 by mouth once daily 3)  Glipizide 5 Mg Tb24 (Glipizide) .... 2 tablets every am, 1 tablet every pm 4)  Glucophage 500 Mg Tabs (Metformin hcl) .... 2 by mouth two times a day 5)  Januvia 100 Mg Tabs (Sitagliptin phosphate) .Marland Kitchen.. 1 by mouth qd 6)  Actos 45 Mg Tabs (Pioglitazone hcl) .... Take 1 tablet by mouth once a day 7)  Altace 10 Mg Caps (Ramipril) .Marland Kitchen.. 1 by mouth two times a day 8)  Hydrochlorothiazide 25 Mg Tabs (Hydrochlorothiazide) .Marland Kitchen.. 1 by mouth once daily 9)  Viagra 50 Mg Tabs (Sildenafil citrate) 10)  Omeprazole 40 Mg Cpdr (Omeprazole) .Marland Kitchen.. 1 by mouth once daily 11)  Alprazolam 1 Mg Tabs (Alprazolam) .... 1/2 to 1 by mouth once daily as needed anxiety 12)  Metoprolol Tartrate 100 Mg Tabs (Metoprolol tartrate) .Marland Kitchen.. 1 by mouth bid 13)  Adderall Xr 20 Mg Xr24h-cap (Amphetamine-dextroamphetamine) .... 2  by mouth daily And  Patient Instructions: 1)  Please schedule a follow-up appointment in 4 months .  Prescriptions: ADDERALL XR 20 MG XR24H-CAP (AMPHETAMINE-DEXTROAMPHETAMINE) 2  by mouth daily  #180 x 0   Entered and Authorized by:   Nolon Rod. Mahkai Fangman MD   Signed by:   Nolon Rod. Sahasra Belue MD on 11/07/2009   Method used:   Print then Give to Patient   RxID:   [id number] ADDERALL XR 20 MG XR24H-CAP (AMPHETAMINE-DEXTROAMPHETAMINE) 2  by mouth daily  #60 x 0   Entered and Authorized by:   Nolon Rod. Refugia Laneve MD   Signed by:   Nolon Rod. Wallis Vancott MD on 11/07/2009   Method used:   Print then Give to Patient   RxID:   [id number]

## 2010-06-23 NOTE — Progress Notes (Signed)
Summary: ADD LABS  Phone Note Outgoing Call   Details for Reason: PLEASE CALL PT TO SCHEDULE LAB  APPT FOR LABS IRON AND FERRITIN DX ANEMIA.Marland KitchenFelecia Deloach CMA  November 12, 2009 3:13 PM    Follow-up for Phone Call        Appointment scheduled 11/13/09 @ 2PM. Follow-up by: Lavell Islam,  November 12, 2009 3:45 PM

## 2010-06-23 NOTE — Progress Notes (Signed)
Summary: RX  Phone Note Refill Request Call back at [phone]--PT Message from:  Patient on April 09, 2010 11:45 AM  Refills Requested: Medication #1:  ALPRAZOLAM 1 MG  TABS 1/2 to 1 by mouth once daily as needed anxiety   Dosage confirmed as above?Dosage Confirmed   Supply Requested: 3 months [facility] [address] PLEASE FILL TODAY PT IS OUT.  Initial call taken by: Freddy Jaksch,  April 09, 2010 11:46 AM  Follow-up for Phone Call        [facility] patient, please advise. Lucious Groves CMA  April 09, 2010 2:19 PM   Additional Follow-up for Phone Call Additional follow up Details #1::        I can only Rx 30 pills , not 90 in Dr Leta Jungling absence Additional Follow-up by: Marga Melnick MD,  April 09, 2010 2:37 PM    Additional Follow-up for Phone Call Additional follow up Details #2::    Patient notified. Lucious Groves CMA  April 09, 2010 3:41 PM   Prescriptions: ALPRAZOLAM 1 MG  TABS (ALPRAZOLAM) 1/2 to 1 by mouth once daily as needed anxiety  #30 x 0   Entered by:   Lucious Groves CMA   Authorized by:   Marga Melnick MD   Signed by:   Lucious Groves CMA on 04/09/2010   Method used:   Telephoned to ...       [facility] [address] #[id number]* (retail)       [address]       [place], [place]  [address]       Ph: [phone]       Fax: [phone]   RxID:   [id number]

## 2010-06-23 NOTE — Letter (Signed)
Summary: [place] No Show Letter  [facility] [place]  [address] [place], [place] [address]   Phone: [phone]  Fax: [phone]    11/06/2009 MRN: [id number]  Raymond Pugh [address] [place], [place]  [address]   Dear [name],   Our records indicate that you missed your scheduled appointment with __Dr. Paz__ on ___6-16-11.  Please contact this office to reschedule your appointment as soon as possible.  It is important that you keep your scheduled appointments with your physician, so we can provide you the best care possible.  Please be advised that there may be a charge for "no show" appointments.    Sincerely,   [facility] [place] [facility]

## 2010-06-23 NOTE — Progress Notes (Signed)
Summary: blood sugar  Phone Note Call from Patient   Caller: Patient Summary of Call: spoke w/ patient wife says he was having some dizziness and weakness bp was 149/66 and blood sugar was at 300 patient says he hadn't taken medication but has since taken meds and is feeling better now has f/u appt scheduled thurs. to f/u ........Marland KitchenDoristine Devoid  November 04, 2009 3:36 PM

## 2010-06-29 ENCOUNTER — Telehealth: Payer: Self-pay | Admitting: Internal Medicine

## 2010-07-03 ENCOUNTER — Ambulatory Visit (INDEPENDENT_AMBULATORY_CARE_PROVIDER_SITE_OTHER): Payer: Self-pay | Admitting: Internal Medicine

## 2010-07-03 ENCOUNTER — Telehealth: Payer: Self-pay | Admitting: Family Medicine

## 2010-07-03 ENCOUNTER — Encounter: Payer: Self-pay | Admitting: Internal Medicine

## 2010-07-03 DIAGNOSIS — K2270 Barrett's esophagus without dysplasia: Secondary | ICD-10-CM | POA: Insufficient documentation

## 2010-07-03 DIAGNOSIS — D649 Anemia, unspecified: Secondary | ICD-10-CM

## 2010-07-03 DIAGNOSIS — E119 Type 2 diabetes mellitus without complications: Secondary | ICD-10-CM

## 2010-07-03 DIAGNOSIS — I10 Essential (primary) hypertension: Secondary | ICD-10-CM

## 2010-07-06 ENCOUNTER — Encounter: Payer: Self-pay | Admitting: Internal Medicine

## 2010-07-09 LAB — CONVERTED CEMR LAB
ALT: 34 units/L (ref 0–53)
BUN: 15 mg/dL (ref 6–23)
CO2: 25 meq/L (ref 19–32)
Calcium: 8.9 mg/dL (ref 8.4–10.5)
Chloride: 101 meq/L (ref 96–112)
Creatinine, Ser: 0.85 mg/dL (ref 0.40–1.50)
Hemoglobin: 11.5 g/dL — ABNORMAL LOW (ref 13.0–17.0)

## 2010-07-09 NOTE — Assessment & Plan Note (Signed)
Summary: followup---med refill///sph   Vital Signs:  Patient profile:   50 year old male Weight:      257.8 pounds BMI:     36.09 Pulse rate:   80 / minute BP sitting:   130 / 80  (left arm) Cuff size:   large  Vitals Entered By: Shonna Chock CMA (July 03, 2010 2:17 PM) CC: Renew medications    History of Present Illness: ROV needs Rfs ,feeling well  ROS Good medication compliance except for the last few days when he ran out of some of his medicines amb CBGs  from  80 to 120 blood pressure well controlled, with a normal when checked Denies any nausea, vomiting, diarrhea No dysphagia or odynophagia  Current Medications (verified): 1)  Fenofibrate Micronized 134 Mg Caps (Fenofibrate Micronized) .Marland Kitchen.. 1 By Mouth Once Daily 2)  Pravachol 40 Mg Tabs (Pravastatin Sodium) .Marland Kitchen.. 1 By Mouth Once Daily. Due For Office Visit Before Additional Refills. 3)  Glipizide 10 Mg Tabs (Glipizide) .... 2 Tablets By Mouth in The Am, 1 Tablet By Mouth in The Afternoon. 4)  Glucophage 500 Mg Tabs (Metformin Hcl) .... 2 By Mouth Two Times A Day. Due For Office Visit. 5)  Altace 10 Mg Caps (Ramipril) .Marland Kitchen.. 1 By Mouth Two Times A Day. Due For Office Visit Before Additional Refills. 6)  Hydrochlorothiazide 25 Mg Tabs (Hydrochlorothiazide) .Marland Kitchen.. 1 By Mouth Once Daily. Due For Office Visit Before Additional Refills. 7)  Viagra 50 Mg Tabs (Sildenafil Citrate) 8)  Omeprazole 40 Mg Cpdr (Omeprazole) .Marland Kitchen.. 1 By Mouth Once Daily 9)  Alprazolam 1 Mg  Tabs (Alprazolam) .... 1/2 To 1 By Mouth Once Daily As Needed Anxiety 10)  Metoprolol Tartrate 100 Mg  Tabs (Metoprolol Tartrate) .Marland Kitchen.. 1 By Mouth Bid 11)  Adderall Xr 20 Mg Xr24h-Cap (Amphetamine-Dextroamphetamine) .... 2  By Mouth Daily  Allergies (verified): No Known Drug Allergies  Past History:  Past Medical History: Hypertension High Cholesterol Diabetes mellitus, type II Radiokeratotomy 50 y/o  retinal detachment , s/p repair 2008  Hyperlipidemia  Barrett's esophagus (by EGD ,  biopsy 2005)   Past Surgical History: Reviewed history from 11/04/2008 and no changes required. Hydrocele repair Radiokeratotomy 50 y/o  retinal detachment , s/p repair 2008   Physical Exam  General:  alert, well-developed, and overweight-appearing.   Lungs:  normal respiratory effort, no intercostal retractions, no accessory muscle use, and normal breath sounds.   Heart:  normal rate, regular rhythm, and no murmur.   Abdomen:  soft, non-tender, no distention, no masses, no guarding, and no rigidity.     Impression & Recommendations:  Problem # 1:  ANEMIA (ICD-285.9) the patient was found to have iron deficiency anemia, was referred to GI but did not go He is currently uninsure He has a history of Barrett's esophagus by EGD 2005 I told the patient that I'm very concerned, this could  represent cancer some were in he is GI tract, he definitely needs a GI workup. I will refer him again to GI, at the same time, asked him to call me if he is unable to go to our GI Department, I will try to find another way to help him  Orders: TLB-Hemoglobin (Hgb) (85018-HGB)  Hgb: 11.4 (11/13/2009)   Hct: 34.9 (11/13/2009)   Platelets: 315.0 (11/13/2009) RBC: 4.34 (11/13/2009)   RDW: 17.5 (11/13/2009)   WBC: 8.7 (11/13/2009) MCV: 80.3 (11/13/2009)   MCHC: 32.7 (11/13/2009) Ferritin: 8.5 (11/13/2009) Iron: 36 (11/13/2009)   TSH: 1.36 (04/05/2007)  Problem #  2:  HYPERLIPIDEMIA (ICD-272.4)  His updated medication list for this problem includes:    Fenofibrate Micronized 134 Mg Caps (Fenofibrate micronized) .Marland Kitchen... 1 by mouth once daily    Pravachol 40 Mg Tabs (Pravastatin sodium) .Marland Kitchen... 1 by mouth once daily  Labs Reviewed: SGOT: 25 (07/24/2009)   SGPT: 25 (07/24/2009)   HDL:41.80 (07/24/2009), 25.0 (04/05/2007)  LDL:81 (07/24/2009), 77 (04/05/2007)  Chol:153 (07/24/2009), 121 (04/05/2007)  Trig:150.0 (07/24/2009), 94 (04/05/2007)  Problem # 3:  DIABETES MELLITUS, TYPE  II (ICD-250.00)  The following medications were removed from the medication list:    Januvia 100 Mg Tabs (Sitagliptin phosphate) .Marland Kitchen... 1 by mouth qd His updated medication list for this problem includes:    Glipizide 10 Mg Tabs (Glipizide) .Marland Kitchen... 2 tablets by mouth in the am, 1 tablet by mouth in the afternoon.    Glucophage 500 Mg Tabs (Metformin hcl) .Marland Kitchen... 2 by mouth two times a day    Altace 10 Mg Caps (Ramipril) .Marland Kitchen... 1 by mouth two times a day.  Orders: TLB-ALT (SGPT) (84460-ALT) TLB-AST (SGOT) (84450-SGOT) TLB-A1C / Hgb A1C (Glycohemoglobin) (83036-A1C) Specimen Handling (78295)  Labs Reviewed: Creat: 0.7 (07/24/2009)    Reviewed HgBA1c results: 7.1 (11/07/2009)  7.1 (07/24/2009)  Problem # 4:  HYPERTENSION (ICD-401.9)  His updated medication list for this problem includes:    Altace 10 Mg Caps (Ramipril) .Marland Kitchen... 1 by mouth two times a day.    Hydrochlorothiazide 25 Mg Tabs (Hydrochlorothiazide) .Marland Kitchen... 1 by mouth once daily    Metoprolol Tartrate 100 Mg Tabs (Metoprolol tartrate) .Marland Kitchen... 1 by mouth bid  Orders: Venipuncture (62130) TLB-BMP (Basic Metabolic Panel-BMET) (80048-METABOL) Specimen Handling (86578)  BP today: 130/80 Prior BP: 110/68 (11/07/2009)  Labs Reviewed: K+: 3.6 (07/24/2009) Creat: : 0.7 (07/24/2009)   Chol: 153 (07/24/2009)   HDL: 41.80 (07/24/2009)   LDL: 81 (07/24/2009)   TG: 150.0 (07/24/2009)  Complete Medication List: 1)  Fenofibrate Micronized 134 Mg Caps (Fenofibrate micronized) .Marland Kitchen.. 1 by mouth once daily 2)  Pravachol 40 Mg Tabs (Pravastatin sodium) .Marland Kitchen.. 1 by mouth once daily 3)  Glipizide 10 Mg Tabs (Glipizide) .... 2 tablets by mouth in the am, 1 tablet by mouth in the afternoon. 4)  Glucophage 500 Mg Tabs (Metformin hcl) .... 2 by mouth two times a day 5)  Altace 10 Mg Caps (Ramipril) .Marland Kitchen.. 1 by mouth two times a day. 6)  Hydrochlorothiazide 25 Mg Tabs (Hydrochlorothiazide) .Marland Kitchen.. 1 by mouth once daily 7)  Viagra 50 Mg Tabs (Sildenafil  citrate) 8)  Omeprazole 40 Mg Cpdr (Omeprazole) .Marland Kitchen.. 1 by mouth once daily 9)  Alprazolam 1 Mg Tabs (Alprazolam) .... 1/2 to 1 by mouth once daily as needed anxiety 10)  Metoprolol Tartrate 100 Mg Tabs (Metoprolol tartrate) .Marland Kitchen.. 1 by mouth bid 11)  Adderall Xr 20 Mg Xr24h-cap (Amphetamine-dextroamphetamine) .... 2  by mouth daily  Other Orders: Gastroenterology Referral (GI)  Patient Instructions: 1)  Please schedule a follow-up appointment in 4 months .  Prescriptions: ADDERALL XR 20 MG XR24H-CAP (AMPHETAMINE-DEXTROAMPHETAMINE) 2  by mouth daily  #60 x 0   Entered and Authorized by:   Nolon Rod. Yussef Jorge MD   Signed by:   Nolon Rod. Earnestine Tuohey MD on 07/03/2010   Method used:   Print then Give to Patient   RxID:   [id number] METOPROLOL TARTRATE 100 MG  TABS (METOPROLOL TARTRATE) 1 by mouth bid  #60 x 4   Entered and Authorized by:   Nolon Rod. Trigger Frasier MD   Signed by:   Elita Quick  Elisabeth Cara MD on 07/03/2010   Method used:   Print then Give to Patient   RxID:   [id number] ALPRAZOLAM 1 MG  TABS (ALPRAZOLAM) 1/2 to 1 by mouth once daily as needed anxiety  #30 x 1   Entered and Authorized by:   Elita Quick E. Shaunae Sieloff MD   Signed by:   Nolon Rod. Kalev Temme MD on 07/03/2010   Method used:   Print then Give to Patient   RxID:   [id number] OMEPRAZOLE 40 MG CPDR (OMEPRAZOLE) 1 by mouth once daily  #30 x 4   Entered and Authorized by:   Nolon Rod. Sabas Frett MD   Signed by:   Nolon Rod. Jarriel Papillion MD on 07/03/2010   Method used:   Print then Give to Patient   RxID:   [id number] HYDROCHLOROTHIAZIDE 25 MG TABS (HYDROCHLOROTHIAZIDE) 1 by mouth once daily  #30 x 4   Entered and Authorized by:   Nolon Rod. Inga Noller MD   Signed by:   Nolon Rod. Shandale Malak MD on 07/03/2010   Method used:   Print then Give to Patient   RxID:   [id number] ALTACE 10 MG CAPS (RAMIPRIL) 1 by mouth two times a day.  #30 x 4   Entered and Authorized by:   Nolon Rod. Makenize Messman MD   Signed by:   Nolon Rod. Mehmet Scally MD on 07/03/2010   Method used:   Print then Give to Patient   RxID:    [id number] GLUCOPHAGE 500 MG TABS (METFORMIN HCL) 2 by mouth two times a day  #120 x 4   Entered and Authorized by:   Nolon Rod. Jorene Kaylor MD   Signed by:   Nolon Rod. Danaja Lasota MD on 07/03/2010   Method used:   Print then Give to Patient   RxID:   [id number] GLIPIZIDE 10 MG TABS (GLIPIZIDE) 2 tablets by mouth in the am, 1 tablet by mouth in the afternoon.  #90 x 4   Entered and Authorized by:   Nolon Rod. Candee Hoon MD   Signed by:   Nolon Rod. Benjiman Sedgwick MD on 07/03/2010   Method used:   Print then Give to Patient   RxID:   [id number] PRAVACHOL 40 MG TABS (PRAVASTATIN SODIUM) 1 by mouth once daily  #30 x 4   Entered and Authorized by:   Nolon Rod. Monicia Tse MD   Signed by:   Nolon Rod. Delmi Fulfer MD on 07/03/2010   Method used:   Print then Give to Patient   RxID:   [id number] FENOFIBRATE MICRONIZED 134 MG CAPS (FENOFIBRATE MICRONIZED) 1 by mouth once daily  #30 x 4   Entered and Authorized by:   Nolon Rod. Nyxon Strupp MD   Signed by:   Nolon Rod. Sheresa Cullop MD on 07/03/2010   Method used:   Print then Give to Patient   RxID:   [id number]    Orders Added: 1)  Venipuncture [09381] 2)  TLB-BMP (Basic Metabolic Panel-BMET) [80048-METABOL] 3)  TLB-ALT (SGPT) [84460-ALT] 4)  TLB-AST (SGOT) [84450-SGOT] 5)  TLB-A1C / Hgb A1C (Glycohemoglobin) [83036-A1C] 6)  TLB-Hemoglobin (Hgb) [85018-HGB] 7)  Specimen Handling [99000] 8)  Gastroenterology Referral [GI]

## 2010-07-09 NOTE — Progress Notes (Signed)
Summary: rx  Phone Note Refill Request   Refills Requested: Medication #1:  PRAVACHOL 40 MG TABS 1 by mouth once daily. DUE FOR OFFICE VISIT BEFORE ADDITIONAL REFILLS.  Medication #2:  GLIPIZIDE 10 MG TABS 2 tablets by mouth in the am  Medication #3:  GLUCOPHAGE 500 MG TABS 2 by mouth two times a day. DUE FOR OFFICE VISIT.  Medication #4:  METOPROLOL TARTRATE 100 MG  TABS 1 by mouth bid hctz 25 mg,alprazolam, fenofibrate,omeprazole-- [facility] on [address]  Initial call taken by: Freddy Jaksch,  July 03, 2010 2:01 PM  Follow-up for Phone Call        Okay to feel Xanax for 1 month- pts appt got bumped due to Dr.Paz not being here.  Follow-up by: Army Fossa CMA,  July 03, 2010 2:06 PM    Prescriptions: ALPRAZOLAM 1 MG  TABS (ALPRAZOLAM) 1/2 to 1 by mouth once daily as needed anxiety  #30 x 0   Entered and Authorized by:   Neena Rhymes MD   Signed by:   Neena Rhymes MD on 07/03/2010   Method used:   Print then Give to Patient   RxID:   [id number] OMEPRAZOLE 40 MG CPDR (OMEPRAZOLE) 1 by mouth once daily  #30 x 0   Entered by:   Army Fossa CMA   Authorized by:   Nolon Rod. Paz MD   Signed by:   Army Fossa CMA on 07/03/2010   Method used:   Electronically to        [facility] #[id number]* (retail)       [address]       [place], [place]  [address]       Ph: [phone]       Fax: [phone]   RxID:   [id number] FENOFIBRATE MICRONIZED 134 MG CAPS (FENOFIBRATE MICRONIZED) 1 by mouth once daily  #30 x 0   Entered by:   Army Fossa CMA   Authorized by:   Nolon Rod. Paz MD   Signed by:   Army Fossa CMA on 07/03/2010   Method used:   Electronically to        [facility] #[id number]* (retail)       [address]       [place], [place]  [address]       Ph: [phone]       Fax: [phone]   RxID:   [id number] HYDROCHLOROTHIAZIDE 25 MG TABS  (HYDROCHLOROTHIAZIDE) 1 by mouth once daily. DUE FOR OFFICE VISIT BEFORE ADDITIONAL REFILLS.  #30 x 0   Entered by:   Army Fossa CMA   Authorized by:   Nolon Rod. Paz MD   Signed by:   Army Fossa CMA on 07/03/2010   Method used:   Electronically to        [facility] #[id number]* (retail)       [address]       [place], [place]  [address]       Ph: [phone]       Fax: [phone]   RxID:   [id number] GLUCOPHAGE 500 MG TABS (METFORMIN HCL) 2 by mouth two times a day. DUE FOR OFFICE VISIT.  #120 x 0   Entered by:   Army Fossa CMA   Authorized by:   Wanda Plump  MD   Signed by:   Army Fossa CMA on 07/03/2010   Method used:   Electronically to        [facility] [phone]* (retail)       [address]       [place], [place]  [address]       Ph: [phone]       Fax: [phone]   RxID:   [id number] GLIPIZIDE 10 MG TABS (GLIPIZIDE) 2 tablets by mouth in the am, 1 tablet by mouth in the afternoon.  #90 x 0   Entered by:   Army Fossa CMA   Authorized by:   Nolon Rod. Paz MD   Signed by:   Army Fossa CMA on 07/03/2010   Method used:   Electronically to        [facility] #[id number]* (retail)       [address]       [place], [place]  [address]       Ph: [phone]       Fax: [phone]   RxID:   [id number] PRAVACHOL 40 MG TABS (PRAVASTATIN SODIUM) 1 by mouth once daily. DUE FOR OFFICE VISIT BEFORE ADDITIONAL REFILLS.  #30 x 0   Entered by:   Army Fossa CMA   Authorized by:   Nolon Rod. Paz MD   Signed by:   Army Fossa CMA on 07/03/2010   Method used:   Electronically to        [facility] #[id number]* (retail)       [address]       [place], [place]  [address]       Ph: [phone]       Fax: [phone]   RxID:   [id number]

## 2010-07-09 NOTE — Progress Notes (Signed)
Summary: 3 refills ---pt has appt on 2/10  Phone Note Refill Request Message from:  Patient on June 29, 2010 10:07 AM  Refills Requested: Medication #1:  ALTACE 10 MG CAPS 1 by mouth two times a day. DUE FOR OFFICE VISIT BEFORE ADDITIONAL REFILLS.  Medication #2:  GLIPIZIDE 10 MG TABS 2 tablets by mouth in the am  Medication #3:  ALPRAZOLAM 1 MG  TABS 1/2 to 1 by mouth once daily as needed anxiety [facility] ---[address]---patient has appointment for Fri 20/02/2011 AT 2:00  Next Appointment Scheduled: Fri 2/10     Yianna Tersigni Initial call taken by: Jerolyn Shin,  June 29, 2010 10:10 AM  Follow-up for Phone Call        Okay to refill? Pt has pending appt for Friday the 10th? Follow-up by: Army Fossa CMA,  June 29, 2010 10:16 AM  Additional Follow-up for Phone Call Additional follow up Details #1::        alprazolam 30 tablets, zero refills Other meds one month supply, zero refills Additional Follow-up by: Treysean Petruzzi E. Lavontay Kirk MD,  June 29, 2010 5:21 PM    Prescriptions: ALPRAZOLAM 1 MG  TABS (ALPRAZOLAM) 1/2 to 1 by mouth once daily as needed anxiety  #30 x 0   Entered by:   Army Fossa CMA   Authorized by:   Nolon Rod. Darryle Dennie MD   Signed by:   Army Fossa CMA on 06/30/2010   Method used:   Printed then faxed to ...       [facility] [address] [id number]* (retail)       [address]       [place], [place]  [address]       Ph: [phone]       Fax: [phone]   RxID:   [id number] ALTACE 10 MG CAPS (RAMIPRIL) 1 by mouth two times a day. DUE FOR OFFICE VISIT BEFORE ADDITIONAL REFILLS.  #60 x 0   Entered by:   Army Fossa CMA   Authorized by:   Nolon Rod. Horrace Hanak MD   Signed by:   Army Fossa CMA on 06/30/2010   Method used:   Printed then faxed to ...       [facility] [address] [phone]* (retail)       [address]       [place], [place]  [address]       Ph: [phone]    Fax: [phone]   RxID:   [id number] GLIPIZIDE 10 MG TABS (GLIPIZIDE) 2 tablets by mouth in the am, 1 tablet by mouth in the afternoon.  #90 x 0   Entered by:   Army Fossa CMA   Authorized by:   Nolon Rod. Pecolia Marando MD   Signed by:   Army Fossa CMA on 06/30/2010   Method used:   Printed then faxed to ...       [facility] [address] #[id number]* (retail)       [address]       [place], [place]  [address]       Ph: [phone]       Fax: [phone]   RxID:   [id number]

## 2010-07-09 NOTE — Progress Notes (Signed)
Summary: Call a nurse/RX  Phone Note Outgoing Call   Details for Reason: [facility] Triage Call Report Triage Record Num: [id number] Operator: Martie Lee Long Patient Name: Raymond Pugh Call Date & Time: 06/26/2010 9:31:47PM Patient Phone: [phone] PCP: Nolon Rod. Judit Awad Patient Gender: Male PCP Fax : Patient DOB: Dec 31, 1960 Practice Name: [facility] Reason for Call: [place]/Patient calling because he is out of his Pravastatin 40 mg, HCTZ 25mg , Fenofibrate , Alaprazam. Pt is calling for a refill. Pt says that he called in the Pravastatin and Fenofibrate and HCTZ but it was not filled. Pt has not taken his medication today. Called 3 days supply(not alprazolam) into [facility] spoke with Chris/Pharmacist [facility] [phone]).Instructed patient to call offcie during businss hrs to follow up. No triage. Protocol(s) Used: Office Note Recommended Outcome per Protocol: Information Noted and Sent to Office Reason for Outcome: Caller information to office Care Advice:  ~ 02 Summary of Call: I left message for pt to call back- per last rx Dr Drue Novel stated he needed a f/u appt before addtional refills. Army Fossa CMA  June 29, 2010 8:59 AM   Follow-up for Phone Call        This is being handled via other phone note. Follow-up by: Lucious Groves CMA,  June 29, 2010 11:50 AM

## 2010-07-10 ENCOUNTER — Telehealth: Payer: Self-pay | Admitting: Internal Medicine

## 2010-07-15 NOTE — Letter (Signed)
Summary: New Patient letter  [facility]  [address] [place], [place] [address]   Phone: [phone]  Fax: [phone]       07/06/2010 MRN: [id number]  Raymond Pugh [address] [place], [place]  [address]  [place]  Dear Raymond Pugh,  Welcome to the Gastroenterology Division at [facility].    You are scheduled to see Dr.  Leone Payor on 08-11-2010 at 2:30pm on the 3rd floor at [facility], [address]  We ask that you try to arrive at our office 15 minutes prior to your appointment time to allow for check-in.  We would like you to complete the enclosed self-administered evaluation form prior to your visit and bring it with you on the day of your appointment.  We will review it with you.  Also, please bring a complete list of all your medications or, if you prefer, bring the medication bottles and we will list them.  Please bring your insurance card so that we may make a copy of it.  If your insurance requires a referral to see a specialist, please bring your referral form from your primary care physician.  Co-payments are due at the time of your visit and may be paid by cash, check or credit card.     Your office visit will consist of a consult with your physician (includes a physical exam), any laboratory testing he/she may order, scheduling of any necessary diagnostic testing (e.g. x-ray, ultrasound, CT-scan), and scheduling of a procedure (e.g. Endoscopy, Colonoscopy) if required.  Please allow enough time on your schedule to allow for any/all of these possibilities.    If you cannot keep your appointment, please call [phone] to cancel or reschedule prior to your appointment date.  This allows [place] the opportunity to schedule an appointment for another patient in need of care.  If you do not cancel or reschedule by 5 p.m. the business day prior to your appointment date, you will be charged a $50.00 late cancellation/no-show fee.    Thank you for  choosing [facility] for your medical needs.  We appreciate the opportunity to care for you.  Please visit [place] at our website  to learn more about our practice.                     Sincerely,                                                             The Gastroenterology Division

## 2010-07-16 ENCOUNTER — Encounter: Payer: Self-pay | Admitting: Internal Medicine

## 2010-07-21 NOTE — Letter (Signed)
Summary: Generic Letter  [facility]  [address] [place], [place] [address]   Phone: [phone]  Fax: [phone]       07/16/2010     [name] [address] [place], [place]  [address]  [place]    TO WHOM IT MAY CONCERN   this letter is in reference to one of my patients Mr Raymond Pugh ,  he has a number of medical problems  including the following   Hypertension High Cholesterol Diabetes mellitus, type II Radiokeratotomy 50 y/o  retinal detachment , s/p repair 2008  Hyperlipidemia  Barrett's esophagus   he needs to take a number of medicines to control his medical problems and some of them are very expensive.  I think he will benefit tremendously from any medical assistance that he could get.  Sincerely,        Willow Ora MD

## 2010-07-30 NOTE — Progress Notes (Signed)
Summary: needs letter from doctor   Phone Note Call from Patient   Caller: spouse Misty Stanley cell [phone] Summary of Call: Patient needs a letter from Dr Drue Novel confirming his high risk diagnosis for diabetes and high blood pressure---this will allow him to qualify for Federal program for insurance called "[employer]"  please call Misty Stanley on her cell at [phone] when letter is ready Initial call taken by: Jerolyn Shin,  July 10, 2010 9:05 AM  Follow-up for Phone Call        Please advise. Lucious Groves CMA  July 10, 2010 9:20 AM  Follow-up by: Lucious Groves CMA,  July 10, 2010 9:20 AM  Additional Follow-up for Phone Call Additional follow up Details #1::        letter done, please be sure we get consent from the patient to release the letter  Additional Follow-up by: [facility] E. Thijs Brunton MD,  July 16, 2010 5:19 PM    Additional Follow-up for Phone Call Additional follow up Details #2::    Left message for pt to call back-- waiting on him before calling Misty Stanley. Army Fossa CMA  July 17, 2010 8:55 AM   Left pts spouse a message stating we needed pts constent to release letter. Army Fossa CMA  July 17, 2010 1:03 PM  Left message for pt on home # to call back.Army Fossa CMA  July 20, 2010 10:14 AM   Additional Follow-up for Phone Call Additional follow up Details #3:: Details for Additional Follow-up Action Taken: Pt stopped by and pick up letter-- [place] spoke w/ pt and gave to pt. Army Fossa CMA  July 20, 2010 3:53 PM

## 2010-08-03 ENCOUNTER — Encounter: Payer: Self-pay | Admitting: Internal Medicine

## 2010-08-03 ENCOUNTER — Telehealth: Payer: Self-pay | Admitting: Internal Medicine

## 2010-08-03 ENCOUNTER — Ambulatory Visit: Payer: Self-pay | Admitting: Internal Medicine

## 2010-08-04 NOTE — Procedures (Signed)
Summary: EGD   EGD  Procedure date:  01/14/2004  Findings:      Location: [facility]   Patient Name: Raymond Pugh, Hackman MRN: [id number] Procedure Procedures: Panendoscopy (EGD) CPT: 43235.    with multiple biopsies. CPT: T6462574. There were greater than 10 biopsies taken.    with [facility] Dilation of the Esophagus Personnel: Endoscopist: Iva Boop, MD, [facility]  Exam Location: Exam performed in Endoscopy Suite. Outpatient  Patient Consent: Procedure, Alternatives, Risks and Benefits discussed, consent obtained,  Indications Symptoms: Dysphagia.  History  Current Medications: Patient is not currently taking Coumadin.  Pre-Exam Physical: Performed Jan 14, 2004  Cardio-pulmonary exam, HEENT exam, Abdominal exam, Extremity exam, Mental status exam WNL.  Exam Exam Info: Maximum depth of insertion Duodenum, intended Duodenum. Patient position: on left side. Gastric retroflexion performed. Images taken. ASA Classification: III. Tolerance: excellent.  Sedation Meds: Patient assessed and found to be appropriate for moderate (conscious) sedation. Cetacaine Spray 2 sprays given aerosolized. Versed 8 mg. given IV. Fentanyl 100 mcg. given IV.  Monitoring: BP and pulse monitoring done. Oximetry used. Supplemental O2 given  Fluoroscopy: Fluoroscopy was not used.  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 Proximal margin 26 cm from mouth,  Z Line 38 cm from mouth, Length of Barrett's 12 cm. No inflammation present. A retroflex image was taken. Biopsy/Barrett's taken. ICD9: Barrett's: 530.2. Comment: 4 quadrant biopsies every 2 cm from 38 to 26 cm.  - Dilation: Distal Esophagus. for dysphagia without stricture. Maloney dilator used, Diameter: 18 mm, Minimal Resistance, Minimal Heme present on extraction. Patient tolerance excellent.  HIATAL HERNIA: Diaphragm 40 cm from mouth. Z-line/GE Junction 38 cm from mouth. 2 cms. in length. ICD9: Hernia, Hiatal: 553.3. -  Normal: Fundus to Duodenal 2nd Portion.   Assessment Abnormal examination, see findings above.  Diagnoses: 530.2: Barrett's.  553.3: Hernia, Hiatal.   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 NO OBVIOUS STRICTURE SEEN. HIS HEARTBURN IS GONE ON PROTONIX 40 MG/DAY Events  Unplanned Intervention: No unplanned interventions were required.  Plans Medication(s): Continue current medications.  Patient Education: Patient given standard instructions for: Barrett's. Reflux.  Comments: CONTINUE PROTONIX  Disposition: After procedure patient sent to recovery.  Scheduling: Call office for appointment, to Iva Boop, MD, [facility], 4 WEEKS   cc: The Patient     Fuller Mandril, MD  This report was created from the original endoscopy report, which was reviewed and signed by the above listed endoscopist.

## 2010-08-04 NOTE — Assessment & Plan Note (Signed)
Summary: Gastroenterology  [place]   MR#:  [id number] Page #  NAME:  Raymond Pugh, Raymond Pugh    OFFICE NO:  [id number]  DATE:  12/26/03  DOB:  10-26-2060  REFERRING PHYSICIAN:  [facility]   PRIMARY CARE PHYSICIAN:  [facility]   PROBLEMS:  Intermittent choking.  HISTORY: The patient is a pleasant 50 year old white male with a history of non-insulin-dependent diabetes mellitus and hypertension. He also has a diagnosis of a nonischemic cardiomyopathy with ejection fraction of 25% to 30% in 2000. He has had cardiac catheterization with normal coronaries.   The patient reports that he has been having intermittent episodes of dysphagia for several years and had an episode about 6 months ago, at which time he choked on some hamburger. He says he was not able to regurgitate or cough the hamburger up, and one of his friends had to call EMS. By the time they arrived, he actually was able to regurgitate it. He was told at that time that he should have GI evaluation. He had another episode December 16, 2003, which took him to the emergency room. At that time, he had made some pork ribs and had a piece of meat lodge. Again, he has a sensation of choking higher up in his esophagus but says that he tried to choke, cough, and regurgitate but was unable to dislodge the meat. He was given glucagon in the emergency room, at which time it passed into his stomach. He says since that time he has been afraid to eat meat and has been eating a lot of soft foods, toasted cheese, etc.  He has had problems with heartburn intermittently for years. Does sleep with the head of his bed elevated. Tries to have an empty stomach at bedtime, etc. He had been placed on Protonix at one point, which did help his heartburn symptoms, but he has not been on any proton pump inhibitor chronically. He complains of a lot of belching and gas, which he attributes to his diabetic medications. He has no current complaint of abdominal  pain. No problems with his bowels.  CURRENT MEDICATIONS: Hydrochlorothiazide 25 mg q.d.; TriCor 145 mg p.o. q.d.; metformin 500 mg b.i.d.; glipizide 1 p.o.  b.i.d.; Lanoxin 0.125 mg q.d.; Altace 10 mg b.i.d.; Toprol XL 1 p.o. q.d. (uncertain of mg); Avandia 4 mg  1 p.o. b.i.d.; Ecotrin daily, which he says that he often forgets.  ALLERGIES:  No known drug allergies.  PAST MEDICAL HISTORY:  As outlined above with non-insulin-dependent diabetes mellitus, hypertension, nonischemic cardiomyopathy.  FAMILY HISTORY:  Pertinent for diabetes in his father. Father also with colon polyps in his 78s and history of bone cancer.  SOCIAL HISTORY:  The patient is married. Has 4 children. He is employed as a job Glass blower/designer. He is a nonsmoker. Drinks 4 to 5 beers 2 to 3 times per month.  REVIEW OF SYSTEMS:  Negative for chest pain or anginal symptoms. He denies any cough, shortness of breath, or sputum production. He says he has been working with exercise to strengthen his heart. He has noticed some vision changes over the past year or so.  PHYSICAL EXAMINATION:  This is a well-developed white male in no acute distress.  Height is 5 feet 11 inches. Weight is 225. Blood pressure 104/68. Pulse is 68. HEENT:  Nontraumatic; normocephalic.  Extraocular movements are intact.  Pupils are equal, round and reactive to light and accommodation.  Sclerae are anicteric.  Neck is supple without nodes.  Cardiovascular:  Regular rate and rhythm with S1 and S2; no significant murmur, rub, or gallop.  Pulmonary:  Clear to auscultation and percussion.  Abdomen:  Soft. Bowel sounds are active. He is nontender.  There is no palpable mass or hepatosplenomegaly.  Rectal exam is not done at this time.  IMPRESSION:   1. A 50 year old male with 2 episodes of food impactions associated with frequent heartburn and intermittent solid-food dysphagia, all consistent with chronic gastroesophageal reflux disease and peptic stricture.  2.  Nonischemic cardiomyopathy, non-insulin-dependent diabetes mellitus, and hypertension.  PLAN:   1. Schedule esophagogastroduodenoscopy with [facility]. 2. Protonix 40 mg p.o. q. a.m. 3. Also discussed and provided him with information regarding an antireflux regimen.     Sammuel Cooper, P.A.-C. Iva Boop, M.D.   ASE/cal002/lw cc:  [facility]  D:  12/26/03; T:  ; Job [phone]

## 2010-08-11 ENCOUNTER — Ambulatory Visit: Payer: Self-pay | Admitting: Internal Medicine

## 2010-08-11 NOTE — Letter (Signed)
Summary: Appt Reminder 2  [facility]  [address] [place], [place] [address]   Phone: [phone]  Fax: [phone]        August 03, 2010 MRN: [id number]    PEDRAM GOODCHILD [address] [place], [place]  [address]    Dear Mr. Laviolette,   You have a return appointment with Dr. Leone Payor on April 3,2012 at 1130am. Please bring medicines you are taking, your insurance card and your co-pay.  If you have to cancel or reschedule this appointment, please call before 5:00 pm the evening before to avoid a cancellation fee.  If you have any questions or concerns, please call [phone].    Sincerely,    Graciella Freer RN

## 2010-08-11 NOTE — Progress Notes (Signed)
Summary: Appt "switch"   Phone Note Call from Patient Call back at Home Phone [phone]   Caller: Patient Call For: Dr. Leone Payor Reason for Call: Talk to Nurse Summary of Call: Patient had an appt scheduled for today but he had a meeting at work so his wife came in his place to be seen by Dr. Leone Payor, the wife had an appt 08/11/10 and wanted to switch appts but the appt was taken before I could "switch" these appt times, there are no more new patient appts open for Jazarah Capili at this time and wife wants husbands appt rescheduled, his symptoms are BARRETTS ESOPHAGUS,IRON DEF ANEMIA this appt was originally scheduled by his primary care provider  Initial call taken by: [place] Johnson,  August 03, 2010 10:38 AM  Follow-up for Phone Call        Patient needed a Tuesday or Thursday appointment so he was given August 25, 2010 at 1130am. Follow-up by: Graciella Freer RN,  August 03, 2010 1:30 PM

## 2010-08-13 ENCOUNTER — Other Ambulatory Visit: Payer: Self-pay | Admitting: Internal Medicine

## 2010-08-13 NOTE — Telephone Encounter (Signed)
Plan does not cover this medication.  Please call plan at [phone] to initiate prior authorization or call/fax pharmacy to change medication.  Patient ID# [id number]

## 2010-08-14 ENCOUNTER — Other Ambulatory Visit: Payer: Self-pay | Admitting: Internal Medicine

## 2010-08-14 MED ORDER — AMPHETAMINE-DEXTROAMPHET ER 20 MG PO CP24: ORAL_CAPSULE | ORAL | Status: DC

## 2010-08-14 NOTE — Telephone Encounter (Signed)
Call insurance awaiting paperwork to be faxed.

## 2010-08-14 NOTE — Telephone Encounter (Signed)
Duplicate request already in process

## 2010-08-14 NOTE — Telephone Encounter (Signed)
Plan does not cover this medication.  Please call plan at [phone] to initiate prior authorization or call/fax pharmacy to change medication.  Patient ID# [id number]

## 2010-08-17 ENCOUNTER — Telehealth: Payer: Self-pay | Admitting: Internal Medicine

## 2010-08-17 NOTE — Telephone Encounter (Signed)
Error/ Duplicate

## 2010-08-19 NOTE — Telephone Encounter (Signed)
Form completed and faxed awaiting approval status.

## 2010-08-19 NOTE — Telephone Encounter (Signed)
Medication has been approved

## 2010-08-25 ENCOUNTER — Other Ambulatory Visit (INDEPENDENT_AMBULATORY_CARE_PROVIDER_SITE_OTHER): Payer: PRIVATE HEALTH INSURANCE

## 2010-08-25 ENCOUNTER — Encounter: Payer: Self-pay | Admitting: Internal Medicine

## 2010-08-25 ENCOUNTER — Ambulatory Visit (INDEPENDENT_AMBULATORY_CARE_PROVIDER_SITE_OTHER): Payer: PRIVATE HEALTH INSURANCE | Admitting: Internal Medicine

## 2010-08-25 VITALS — BP 124/76 | HR 60 | Ht 71.0 in | Wt 245.0 lb

## 2010-08-25 DIAGNOSIS — E669 Obesity, unspecified: Secondary | ICD-10-CM

## 2010-08-25 DIAGNOSIS — D509 Iron deficiency anemia, unspecified: Secondary | ICD-10-CM

## 2010-08-25 DIAGNOSIS — E6609 Other obesity due to excess calories: Secondary | ICD-10-CM | POA: Insufficient documentation

## 2010-08-25 DIAGNOSIS — D649 Anemia, unspecified: Secondary | ICD-10-CM

## 2010-08-25 DIAGNOSIS — K2270 Barrett's esophagus without dysplasia: Secondary | ICD-10-CM

## 2010-08-25 MED ORDER — PEG-KCL-NACL-NASULF-NA ASC-C 100 G PO SOLR: 1.0000 | Freq: Once | ORAL | Status: AC

## 2010-08-25 NOTE — Assessment & Plan Note (Signed)
He has not had a followup exam since his initial diagnosis 2005. Extensive disease from 38 cm to 26 cm but no dysplasia at that time. He was without insurance and did not followup until now. He's had only one brief episode of dysphagia.  EGD will be performed. Risks benefits and indications explained he understands and agrees to proceed. I have advised him again that there is a potential cancer risk in Barrett's esophagus. He is without heartburn on a PPI, currently using omeprazole 40 mg daily.

## 2010-08-25 NOTE — Assessment & Plan Note (Addendum)
Etiology is not clear at this time. He has not had visible bleeding but occult blood loss is certainly possible from the Barrett's esophagus were occult colonic lesion. Colonoscopy and EGD will be performed. Risks benefits and indications explained he understands and agrees to proceed.  He is also on metformin long-term which puts him at risk for B12 deficiency, the chronic PPI therapy may do the same and I'm going to check a B12 level as well.

## 2010-08-25 NOTE — Progress Notes (Signed)
Subjective:    Patient ID: Raymond Pugh, male    DOB: 04-Feb-1961, 50 y.o.   MRN: [id number]  HPI Comments: 50 year old white man not seen since 2005, when he was diagnosed with Barrett's esophagus, 12 cm. Base he did not followup, it he indicates he was uninsured and unable to do so. He has had one brief episode of dysphagia this states in the interim and that was recently. As long as he takes a PPI he says he has no heartburn or indigestion or swallowing problems except as noted. He is intentionally losing some weight lately about 10 pounds in [facility] He was found last summer with a hemoglobin in the 11 range and an MCV of about 80 and his ferritin was 8. Recent followup labs show a similar hemoglobin. He has not noted any rectal bleeding or hematochezia except for a brief spell where he had a swollen protruding hemorrhoid that bled but that was transient and self-limited. That was last year, it seems. Bowel habits are regular without abdominal pain nausea vomiting or other GI symptoms. He has not felt weak or tired at work particularly fatigued. He is not on iron therapy at this point.  Anemia   Current outpatient prescriptions:ALPRAZolam (XANAX) 1 MG tablet, Take 1 mg by mouth at bedtime as needed. 1/2 to one by mouth as needed , Disp: , Rfl: ;  amphetamine-dextroamphetamine (ADDERALL XR, 20MG ,) 20 MG 24 hr capsule, Take 2 Capsules by mouth every day, Disp: 60 capsule, Rfl: 0;  fenofibrate micronized (LOFIBRA) 134 MG capsule, Take 134 mg by mouth daily before breakfast.  , Disp: , Rfl:  glipiZIDE (GLUCOTROL) 10 MG tablet, Take 10 mg by mouth 2 (two) times daily before a meal.  , Disp: , Rfl: ;  hydrochlorothiazide 25 MG tablet, Take 25 mg by mouth daily.  , Disp: , Rfl: ;  metFORMIN (GLUCOPHAGE) 500 MG tablet, Take 500 mg by mouth. Take 2 tablets twice daily, Disp: , Rfl: ;  metoprolol (TOPROL-XL) 100 MG 24 hr tablet, Take 100 mg by mouth 2 (two) times daily.  , Disp: , Rfl:   omeprazole (PRILOSEC) 40 MG capsule, Take 40 mg by mouth daily.  , Disp: , Rfl: ;  pravastatin (PRAVACHOL) 40 MG tablet, Take 40 mg by mouth daily.  , Disp: , Rfl: ;  ramipril (ALTACE) 10 MG capsule, Take 10 mg by mouth 2 (two) times daily.  , Disp: , Rfl: ;  sildenafil (VIAGRA) 50 MG tablet, Take 50 mg by mouth daily as needed.  , Disp: , Rfl:  Past Medical History  Diagnosis Date  . Hypertension   . Hyperlipemia   . Diabetes mellitus   . Barrett's esophagus   . Hiatal hernia   . CAD (coronary artery disease)    Past Surgical History  Procedure Date  . Hydrocele excision / repair   . Retinal detachment surgery   . Radiokeratotomy     reports that he has never smoked. He has never used smokeless tobacco. He reports that he drinks alcohol. He reports that he does not use illicit drugs. family history includes Asthma in his mother; Colon polyps in his father; Diabetes in his father; and Hypertension in his father and mother. No Known Allergies      Review of Systems [facility] [employer] and is losing some weight intentionally. The remainder of the of the entire review of systems is reviewed and is negative.  Objective:   Physical Exam  Constitutional: He is oriented to person, place, and time. He appears well-developed and well-nourished.       Obese  HENT:  Head: Normocephalic and atraumatic.  Mouth/Throat: Oropharynx is clear and moist. No oropharyngeal exudate.  Eyes: Conjunctivae are normal. Pupils are equal, round, and reactive to light. No scleral icterus.  Neck: Normal range of motion. Neck supple. No thyromegaly present.       No mass or supraclavicular adenopathy  Cardiovascular: Normal rate, regular rhythm and normal heart sounds.  Exam reveals no gallop and no friction rub.   No murmur heard. Pulmonary/Chest: Effort normal and breath sounds normal. He has no wheezes. He has no rales.  Abdominal: Soft. Bowel sounds are normal. He exhibits no  distension and no mass. There is no tenderness.       No hernia, hepatosplenomegaly  Genitourinary:       Rectal exam is deferred at this time until colonoscopy  Musculoskeletal: He exhibits no edema.  Lymphadenopathy:    He has no cervical adenopathy.  Neurological: He is alert and oriented to person, place, and time.  Skin: Skin is warm and dry.  Psychiatric: He has a normal mood and affect.          Assessment & Plan:

## 2010-08-25 NOTE — Patient Instructions (Signed)
Colonoscopy A colonoscopy is an exam to evaluate your entire colon. In this exam, your colon is cleansed. A long fiberoptic tube is inserted through your rectum and into your colon. The fiberoptic scope (endoscope) is a long bundle of enclosed and very flexible fibers. These fibers transmit light to the area examined and send images from that area to your caregiver. Discomfort is usually minimal. You may be given a drug to help you sleep (sedative) during or prior to the procedure. This exam helps to detect lumps (tumors), polyps, inflammation, and areas of bleeding. Your caregiver may also take a small piece of tissue (biopsy) that will be examined under a microscope. BEFORE THE PROCEDURE  A clear liquid diet may be required for 2 days before the exam.   Liquid injections (enemas) or laxatives may be required.   A large amount of electrolyte solution may be given to you to drink over a short period of time. This solution is used to clean out your colon.   You should be present 1  prior to your procedure or as directed by your caregiver.   Check in at the admissions desk to fill out necessary forms if not preregistered. There will be consent forms to sign prior to the procedure. If accompanied by friends or family, there is a waiting area for them while you are having your procedure.  LET YOUR CAREGIVER KNOW ABOUT:  Allergies to food or medicine.  Medicines taken, including vitamins, herbs, eyedrops, over-the-counter medicines, and creams.   Use of steroids (by mouth or creams).   Previous problems with anesthetics or numbing medicines.   History of bleeding problems or blood clots.  Previous surgery.   Other health problems, including diabetes and kidney problems.   Possibility of pregnancy, if this applies.   AFTER THE PROCEDURE  If you received a sedative and/or pain medicine, you will need to arrange for someone to drive you home.   Occasionally, there is a little blood passed  with the first bowel movement. DO NOT be concerned.  HOME CARE INSTRUCTIONS  It is not unusual to pass moderate amounts of gas and experience mild abdominal cramping following the procedure. This is due to air being used to inflate your colon during the exam. Walking or a warm pack on your belly (abdomen) may help.   You may resume all normal meals and activities after sedatives and medicines have worn off.   Only take over-the-counter or prescription medicines for pain, discomfort, or fever as directed by your caregiver. DO NOT use aspirin or blood thinners if a biopsy was taken. Consult your caregiver for medicine usage if biopsies were taken.  FINDING OUT THE RESULTS OF YOUR TEST Not all test results are available during your visit. If your test results are not back during the visit, make an appointment with your caregiver to find out the results. Do not assume everything is normal if you have not heard from your caregiver or the medical facility. It is important for you to follow up on all of your test results. SEEK IMMEDIATE MEDICAL CARE IF:  You pass large blood clots or fill a toilet with blood following the procedure. This may also occur 10 to 14 days following the procedure. This is more likely if a biopsy was taken.   You develop abdominal pain that keeps getting worse and cannot be relieved with medicine.  Document Released: 05/07/2000 Document Re-Released: 08/04/2009 [facility] Patient Information 2011 [place], [place]. Your Colon/Endo is scheduled on 09/17/2010  at 3:30pm We will send your MoviPrep to your pharmacy today Go to the basement for labs today

## 2010-08-26 ENCOUNTER — Encounter: Payer: Self-pay | Admitting: Internal Medicine

## 2010-08-26 ENCOUNTER — Telehealth: Payer: Self-pay

## 2010-08-26 NOTE — Progress Notes (Signed)
Quick Note:  Let him know B12 level ok ______

## 2010-08-26 NOTE — Telephone Encounter (Signed)
Pt aware of lab results per Dr. Leone Payor.

## 2010-08-26 NOTE — Telephone Encounter (Signed)
Message copied by Chrystie Nose on Wed Aug 26, 2010  2:35 PM ------      Message from: Stan Head      Created: Wed Aug 26, 2010  1:08 PM       Let him know B12 level ok

## 2010-09-07 ENCOUNTER — Other Ambulatory Visit: Payer: Self-pay | Admitting: *Deleted

## 2010-09-07 MED ORDER — RAMIPRIL 10 MG PO CAPS: 10.0000 mg | ORAL_CAPSULE | Freq: Two times a day (BID) | ORAL | Status: DC

## 2010-09-07 NOTE — Telephone Encounter (Signed)
Pt needs a 90 day supply

## 2010-09-16 ENCOUNTER — Encounter: Payer: Self-pay | Admitting: Internal Medicine

## 2010-09-17 ENCOUNTER — Ambulatory Visit (AMBULATORY_SURGERY_CENTER): Payer: PRIVATE HEALTH INSURANCE | Admitting: Internal Medicine

## 2010-09-17 ENCOUNTER — Encounter: Payer: Self-pay | Admitting: Internal Medicine

## 2010-09-17 DIAGNOSIS — Z1211 Encounter for screening for malignant neoplasm of colon: Secondary | ICD-10-CM

## 2010-09-17 DIAGNOSIS — K2270 Barrett's esophagus without dysplasia: Secondary | ICD-10-CM

## 2010-09-17 DIAGNOSIS — K219 Gastro-esophageal reflux disease without esophagitis: Secondary | ICD-10-CM

## 2010-09-17 DIAGNOSIS — D509 Iron deficiency anemia, unspecified: Secondary | ICD-10-CM

## 2010-09-17 DIAGNOSIS — K449 Diaphragmatic hernia without obstruction or gangrene: Secondary | ICD-10-CM

## 2010-09-17 MED ORDER — PANTOPRAZOLE SODIUM 40 MG PO TBEC: 40.0000 mg | DELAYED_RELEASE_TABLET | Freq: Every day | ORAL | Status: DC

## 2010-09-17 MED ORDER — SODIUM CHLORIDE 0.9 % IV SOLN: 500.0000 mL | INTRAVENOUS | Status: DC

## 2010-09-17 NOTE — Patient Instructions (Signed)
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 If needs any further anemia evaluation--will decide after biopsy reviewed You will receive a letter from Dr Leone Payor in 1-2 weeks and this will have your pathology results and his recommendations for further evaluation and procedures 10 year recall for colonoscopy--we will send you a letter to remind you to schedule this  Blue and green sheets discussed with pt and care partner, copies made and original to care partner

## 2010-09-18 ENCOUNTER — Telehealth: Payer: Self-pay

## 2010-09-18 NOTE — Telephone Encounter (Signed)
Left message on pt's hm# to call if any questions or concerns.  It was the same # for work and cell. MAW

## 2010-09-23 NOTE — Progress Notes (Signed)
Quick Note:  Call from office 1) biopsies show Barrett's but stable, no dysplasia (not changing toward cancer) - routine repeat 3 years 2) anemia not clearly explained - do home guaiac cards x 3 , wait another 2 weeks to do to make sure biopsies all healed  [facility] 3 year egd recall Barrett's ______

## 2010-09-24 ENCOUNTER — Telehealth: Payer: Self-pay | Admitting: Internal Medicine

## 2010-09-24 NOTE — Telephone Encounter (Signed)
Left message for patient to call back  

## 2010-09-25 NOTE — Telephone Encounter (Signed)
Reviewed results of pathology and Dr Marvell Fuller recommendations for hemoccult cards.  He will wait two weeks to do cards.

## 2010-10-06 NOTE — Assessment & Plan Note (Signed)
[facility]                         ELECTROPHYSIOLOGY OFFICE NOTE   NAME:Raymond Pugh, Raymond Pugh                      MRN:          [id number]  DATE:06/08/2007                            DOB:          May 03, 1961    HISTORY:  Mr. Oehlert returns today for followup.  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  The patient over  the last several years has gradually gained weight.  When I  initially  met him back in 2000, he weighed 210 pounds and now he weighs almost  270.  The patient has recently developed an abdominal hernia and has  seen a surgeon, and is considering having surgery, but has been  recommended to lose weight before any surgical therapy has been  recommended.  To this end[facility] and is [facility]  several times a week.  He is also trying to cut back on his p.o. intake.   PHYSICAL EXAMINATION:  GENERAL:  He is a pleasant, middle-aged, obese  man in no acute distress.  VITAL SIGNS:  Blood pressure was 125/78, pulse 90 and regular,  respirations were 18.  Weight was 267 pounds.  NECK:  Revealed no jugular venous distention.  LUNGS:  Clear bilaterally to auscultation.  There were no wheezes, rales  or rhonchi.  CARDIOVASCULAR:  Revealed a regular rate and rhythm.  Normal S1-S2.  There are no murmurs, rubs or gallops.  ABDOMEN:  Obese, nontender, nondistended.  EXTREMITIES:  Demonstrate no edema.   IMPRESSION:  1. Diabetes.  2. Hypertension.  3. 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  4. 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  5. Diabetes.  6. Hypertension.   DISCUSSION:  The patient is stable.  I have recommended that he continue  exercising.  Overall if he requires surgery, he would be at low risk  unless symptoms change.  We will plan to see him back in the office in  several  months.     Doylene Canning. Ladona Ridgel, MD  Electronically Signed    GWT/MedQ  DD: 06/08/2007  DT: 06/08/2007  Job #: [id number]

## 2010-10-09 NOTE — Assessment & Plan Note (Signed)
HEALTHCARE                        [facility] OFFICE NOTE   NAME:Raymond Pugh                      MRN:          [id number]  DATE:05/09/2006                            DOB:          [birth year]    CHIEF COMPLAINT:  Here to establish.   HISTORY OF PRESENT ILLNESS:  Mr. Raymond Pugh is a 50 year old white male who  came to the office for the first time to establish and to get a complete  physical examination.  He used to see a doctor in another town but this  location is more convenient.  In general, he is doing well.  He did  complain of anxiety.  He states that he has 3 teenage kids at home and  he is getting very stressed and he is losing him temper very often.  He  self uses some Xanax that a friend gave him, took it once a day and it  worked very well for him, without causing excessive drowsiness.  In  addition to that, he complains of pain at the right foot.  He points to  the heel.   PAST MEDICAL HISTORY:  1. Hypertension diagnosed in 1997.  2. Apparently at some point in his life he was told to have CHF back      in 1997.  He sees Dr. Ladona Ridgel ever since routinely.  3. Diabetes diagnosed in 2002.  4. High cholesterol diagnosed in 2005.   FAMILY HISTORY:  1. No history of prostate or colon cancer.  2. Father has diabetes, hypertension, and heart disease, as well as a      stroke and bone cancer.  3. Mother has hypertension.   SOCIAL HISTORY:  Does not smoke.  He drinks very seldom.  He is married  and has again 3 teenager kids at home.   REVIEW OF SYSTEMS:  Denies any chest pain, shortness of breath, nausea  or diarrhea, blood in the stools, no dysuria or hematuria.  He admits  that he has not been active lately and when asked about his diet he said  that is my biggest problem.  He admits that he has seen a dietitian  once and he knows what to do but it is very hard for him to stick to a  diet.  When he checks his sugars, they are  within normal limits.Marland Kitchen   MEDICATION LIST:  1. The patient knows all the names of his medicines but does not know      the strength.  2. Toprol.  3. Glipizide.  4. Altace.  5. Metformin.  6. Hydrochlorothiazide.  7. Avandia.  8. Crestor.   ALLERGIES:  No known drug allergies.   PHYSICAL EXAMINATION:  The patient is alert, oriented and not in  distress.  He is 5 feet 8.5 inches tall.  He weighs 253 pounds.  Afebrile.  Pulse 60, respirations 16, blood pressure 110/80.  NECK:  No thyromegaly.  LUNGS:  Clear to auscultation bilaterally, without JVD or bruit.  HEART:  Regular rate and rhythm, without murmur.  ABDOMEN:  Obese, soft, nontender, no mass.  EXTREMITIES:  No edema.  There is a slight tenderness at the right heel  plantar area.  No mass or inflammation.  NEUROLOGIC:  Speech clear, strength and motor are intact.   LABORATORY DATA AND X-RAYS:  EKG shows sinus rhythm.  There is poor R-  wave progression and incomplete RBBB.  I have no old EKGs to compare.   ASSESSMENT AND PLAN:  1. The patient is here for a complete physical examination.  I went      ahead and gave him a flu shot.  We also got an EKG.  He was      counseled extensively about diet, exercise as well as his blood      sugar goals.  We will also do blood work.  2. The patient has diabetes, hypertension, high cholesterol.  Again, I      reviewed with the patient the need of diet and exercise and will      check a CBC, comprehensive metabolic panel, A1c, micro albumin and      TSH.  3. The patient has anxiety and I think at this point it is reasonable      to supply Xanax 0.5 1/2 to 1 p.o. daily, #30 with 3 refills.  4. He does have heel pain that sounds like plantar fasciatus.  I went      ahead and showed him how to do stretchings.  5. We will get the records from Dr. Ladona Ridgel.  6. I asked the patient to get the strength of the medications that he      is on.  7. Office visit in 3 months or p.r.n.     Willow Ora, MD  Electronically Signed    JP/MedQ  DD: 05/09/2006  DT: 05/09/2006  Job #: [id number]

## 2010-10-09 NOTE — Assessment & Plan Note (Signed)
[facility]                                 ON-CALL NOTE   NAME:Raymond Pugh, Raymond Pugh                        MRN:          [id number]  DATE:11/13/2006                            DOB:          May 01, 1961    A patient of Dr. Drue Novel.  His wife, Misty Stanley, called from [phone] stating he  has been trying to get his metformin since the 11th of June and the  pharmacist states they have not received anything from Dr. Atlee Abide  regarding the prescription and that he is totally out.  Metformin 500 mg  two twice a day, one month supply, was called in to [facility] at [phone].  The patient called on November 13, 2006.     Lelon Perla, DO  Electronically Signed    Shawnie Dapper  DD: 11/13/2006  DT: 11/13/2006  Job #: [id number]   cc:   Willow Ora, MD

## 2010-10-09 NOTE — Op Note (Signed)
NAME:  Raymond Pugh, Raymond Pugh                         ACCOUNT NO.:  [id number]   MEDICAL RECORD NO.:  [id number]                   PATIENT TYPE:  OIB   LOCATION:  2899                                 FACILITY:  [facility]   PHYSICIAN:  Beulah Gandy. Ashley Royalty, M.D.              DATE OF BIRTH:  03-21-1961   DATE OF PROCEDURE:  10/31/2003  DATE OF DISCHARGE:                                 OPERATIVE REPORT   PREOPERATIVE DIAGNOSIS:  Retinal break, right eye, rhegmatogenous retinal  detachment, left eye.   POSTOPERATIVE DIAGNOSIS:  Retinal break, right eye, rhegmatogenous retinal  detachment, left eye.   OPERATION PERFORMED:  Laser for retinal break, right eye.  Scleral buckle,  left eye.  Retinal photocoagulation, left eye.  Perfluoropropane injections,  left eye.   SURGEON:  Beulah Gandy. Ashley Royalty, M.D.   ASSISTANT:  Bryan Lemma. Lundquist, P.A.   ANESTHESIA:  General.   DESCRIPTION OF PROCEDURE:  After proper endotracheal anesthesia was  instituted, the indirect ophthalmoscope laser was moved into place.  The  power was 400 mW, 1000 microns and 0.1 seconds each.  281 burns were placed  around lesions at 12 o'clock in the superior retinal periphery of the right  eye.  Ointment was placed in the eye and the eye was closed.  Attention was  carried to the left eye where usual prep and drape was performed.  360  degree limbal peritomy, isolation of four rectus muscles on 2-0 silk.  Localization of break at 12 o'clock.  Scleral dissection for 360 degrees to  admit a #279 intrascleral implant.  Diathermy placed in the bed.  A 279  implant was placed around the eye with the joint at 8 o'clock.  A 240 band  was placed around the eye with a 270 sleeve at 8 o'clock.  The perforation  site was chosen at 10 o'clock and a large amount of thick, clear, colorless  subretinal fluid came forth.  It stopped suddenly and indirect  ophthalmoscopy showed there to be some fluid in the 2 o'clock region.  A  second perforation  site was chosen at 2 o'clock and a moderate amount of  clear, colorless subretinal fluid came forth.  A 508 G radial segment was  placed beneath the break at 12 o'clock.  Indirect ophthalmoscopy showed the  retina to be lying nicely on the scleral buckle.  The indirect  ophthalmoscope laser was moved into place and 570 burns were placed on the  scleral buckle around the retinal breaks.  Power was 600 mW, 1000 microns  each and 0.07 seconds each.  Once this was completed, the instruments were  removed from the eye.  The buckle was adjusted and trimmed.  The band was  adjusted and trimmed.  The band was adjusted and trimmed.  Sutures were  knotted and the free ends trimmed.  The conjunctiva was reposited with 7-0  chromic suture.  Polymyxin and gentamicin  were irrigated into Tenon's space.  Atropine solution was applied.  Decadron 10 mg was injected into the lower  subconjunctival space.  Marcaine was injected around the globe for  postoperative pain.  The closing tension was 15 with a Barraquer tonometer.   COMPLICATIONS:  None.   DURATION:  Two hours.   Polysporin, a patch and shield were placed.  The patient was awakened and  taken to recovery in satisfactory condition.                                               Beulah Gandy. Ashley Royalty, M.D.    JDM/MEDQ  D:  10/31/2003  T:  11/01/2003  Job:  [id number]

## 2010-10-14 ENCOUNTER — Telehealth: Payer: Self-pay | Admitting: Internal Medicine

## 2010-10-14 MED ORDER — AMPHETAMINE-DEXTROAMPHET ER 20 MG PO CP24: ORAL_CAPSULE | ORAL | Status: DC

## 2010-10-14 MED ORDER — ALPRAZOLAM 1 MG PO TABS: ORAL_TABLET | ORAL | Status: DC

## 2010-10-14 NOTE — Telephone Encounter (Signed)
Please advise 

## 2010-10-14 NOTE — Telephone Encounter (Signed)
Ok 90 days: adderrall  # 180, no RF Alprazolam #90, no RF

## 2010-10-14 NOTE — Telephone Encounter (Signed)
Needs to pick up prescription for dextro-amphet (generic Adderall XR 20mg ), would also like to pick up  prescription for Alprazolam at same time---would like this to be a 90 day prescription if possible--  please call [phone] when ready for pickup  Going out of town--can he pick this up today??

## 2010-10-14 NOTE — Telephone Encounter (Signed)
Pt is aware.  

## 2010-10-30 ENCOUNTER — Encounter: Payer: Self-pay | Admitting: Internal Medicine

## 2010-11-02 ENCOUNTER — Ambulatory Visit: Payer: Self-pay | Admitting: Internal Medicine

## 2010-11-02 DIAGNOSIS — Z0289 Encounter for other administrative examinations: Secondary | ICD-10-CM

## 2010-12-11 ENCOUNTER — Other Ambulatory Visit: Payer: Self-pay | Admitting: Internal Medicine

## 2010-12-11 MED ORDER — AMPHETAMINE-DEXTROAMPHET ER 20 MG PO CP24: ORAL_CAPSULE | ORAL | Status: DC

## 2010-12-11 NOTE — Telephone Encounter (Signed)
Ok 90, no RF 

## 2010-12-14 MED ORDER — ALPRAZOLAM 1 MG PO TABS: ORAL_TABLET | ORAL | Status: DC

## 2010-12-28 ENCOUNTER — Other Ambulatory Visit: Payer: Self-pay | Admitting: Internal Medicine

## 2011-01-01 ENCOUNTER — Other Ambulatory Visit: Payer: Self-pay | Admitting: Internal Medicine

## 2011-01-01 MED ORDER — METOPROLOL SUCCINATE ER 100 MG PO TB24: 100.0000 mg | ORAL_TABLET | Freq: Two times a day (BID) | ORAL | Status: DC

## 2011-01-01 NOTE — Telephone Encounter (Signed)
Request for Metoprolol [Lopresssor] 100 mg EMR/Epic chart shows patient taking Metoprolol [Toprol] 100 mg Please advise.

## 2011-01-01 NOTE — Telephone Encounter (Signed)
Rx Done . 

## 2011-01-06 ENCOUNTER — Other Ambulatory Visit: Payer: Self-pay | Admitting: Internal Medicine

## 2011-01-07 NOTE — Telephone Encounter (Signed)
Rx Done . 

## 2011-01-15 ENCOUNTER — Telehealth: Payer: Self-pay | Admitting: Internal Medicine

## 2011-01-15 NOTE — Telephone Encounter (Signed)
Patient wants a  

## 2011-01-15 NOTE — Telephone Encounter (Signed)
error 

## 2011-01-15 NOTE — Telephone Encounter (Signed)
Patient wants a note stating it is ok for him to take a swimming class & that he is diabetic - he would like to pick up today

## 2011-01-15 NOTE — Telephone Encounter (Signed)
LMOM to inform patient that we would need more information: who the letter is going to [i.e. Employer,etc] and what needs to be stated [i.e. Why it is [facility] for swimming; what is needed about his diabetic Dx]

## 2011-01-18 ENCOUNTER — Encounter: Payer: Self-pay | Admitting: *Deleted

## 2011-01-18 NOTE — Telephone Encounter (Signed)
Note Done. LMOM ready to p/u.

## 2011-01-19 ENCOUNTER — Other Ambulatory Visit: Payer: Self-pay | Admitting: Internal Medicine

## 2011-01-19 NOTE — Telephone Encounter (Signed)
Rx Done . 

## 2011-01-21 ENCOUNTER — Other Ambulatory Visit: Payer: Self-pay | Admitting: Internal Medicine

## 2011-02-05 ENCOUNTER — Other Ambulatory Visit: Payer: Self-pay | Admitting: Internal Medicine

## 2011-02-05 NOTE — Telephone Encounter (Signed)
Last OV 07/03/10

## 2011-02-05 NOTE — Telephone Encounter (Signed)
Ok 1 month, 1 RF Tell pt no further FR w/o OV

## 2011-03-01 ENCOUNTER — Other Ambulatory Visit: Payer: Self-pay | Admitting: Internal Medicine

## 2011-03-01 NOTE — Telephone Encounter (Signed)
Done

## 2011-04-09 ENCOUNTER — Other Ambulatory Visit: Payer: Self-pay | Admitting: Internal Medicine

## 2011-04-09 NOTE — Telephone Encounter (Signed)
Needs office visit.

## 2011-04-21 ENCOUNTER — Other Ambulatory Visit: Payer: Self-pay | Admitting: Internal Medicine

## 2011-04-22 NOTE — Telephone Encounter (Signed)
Last OV 07/03/2010. Last filled 03/15/11

## 2011-04-26 ENCOUNTER — Other Ambulatory Visit: Payer: Self-pay | Admitting: Internal Medicine

## 2011-04-26 NOTE — Telephone Encounter (Signed)
Rx refill denied office visit needed. 

## 2011-04-26 NOTE — Telephone Encounter (Signed)
Denied, needs OV 

## 2011-04-27 NOTE — Telephone Encounter (Signed)
Last OV 07/03/10. Last fill date 03/15/11

## 2011-04-28 ENCOUNTER — Other Ambulatory Visit: Payer: Self-pay | Admitting: Internal Medicine

## 2011-04-28 MED ORDER — RAMIPRIL 10 MG PO CAPS: 10.0000 mg | ORAL_CAPSULE | Freq: Two times a day (BID) | ORAL | Status: DC

## 2011-05-07 ENCOUNTER — Ambulatory Visit (INDEPENDENT_AMBULATORY_CARE_PROVIDER_SITE_OTHER): Payer: PRIVATE HEALTH INSURANCE | Admitting: Internal Medicine

## 2011-05-07 ENCOUNTER — Ambulatory Visit: Payer: PRIVATE HEALTH INSURANCE | Admitting: Internal Medicine

## 2011-05-07 VITALS — BP 110/62 | HR 87 | Temp 98.0°F | Ht 69.0 in | Wt 230.0 lb

## 2011-05-07 DIAGNOSIS — I10 Essential (primary) hypertension: Secondary | ICD-10-CM

## 2011-05-07 DIAGNOSIS — K2270 Barrett's esophagus without dysplasia: Secondary | ICD-10-CM

## 2011-05-07 DIAGNOSIS — E785 Hyperlipidemia, unspecified: Secondary | ICD-10-CM

## 2011-05-07 DIAGNOSIS — D509 Iron deficiency anemia, unspecified: Secondary | ICD-10-CM

## 2011-05-07 DIAGNOSIS — E119 Type 2 diabetes mellitus without complications: Secondary | ICD-10-CM

## 2011-05-07 NOTE — Assessment & Plan Note (Signed)
EGD 4-12, stable

## 2011-05-07 NOTE — Assessment & Plan Note (Signed)
S/p EGD (Barrett's) and Cscope (normal ) 4-12 Was recommended to do hemocults, done? Labs  iFOB provided

## 2011-05-07 NOTE — Assessment & Plan Note (Signed)
Seems  well-controlled, labs 

## 2011-05-07 NOTE — Progress Notes (Signed)
  Subjective:    Patient ID: Raymond Pugh, male    DOB: June 13, 1960, 50 y.o.   MRN: [id number]  HPI Routine followup Diabetes--patient gave up sodas, has lost 25 pounds, he is swimming and is more active. Hypertension--good medication compliance infrequent ambulatory BPs but usually normal Hyperlipidemia--good medication compliance Anemia--status post GI evaluation, chart reviewed. See assessment and plan.  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     Objective:   Physical Exam  Constitutional: He is oriented to person, place, and time. He appears well-developed. No distress.  Cardiovascular: Normal rate, regular rhythm and normal heart sounds.   No murmur heard. Pulmonary/Chest: Effort normal and breath sounds normal. No respiratory distress. He has no wheezes. He has no rales.  Musculoskeletal:       DIABETIC FEET EXAM: No lower extremity edema Normal pedal pulses bilaterally Skin and nails are normal without calluses Pinprick examination of the feet normal.   Neurological: He is alert and oriented to person, place, and time.  Skin: He is not diaphoretic.  Psychiatric: He has a normal mood and affect. His behavior is normal. Judgment and thought content normal.       Assessment & Plan:  Declined to get a flu shot

## 2011-05-07 NOTE — Assessment & Plan Note (Signed)
Due for labs, reports good med compliance

## 2011-05-07 NOTE — Patient Instructions (Signed)
Came back fasting: FLP AST ALT---- dx hyperlipidemia A1C--- dx DM CBC, iron, ferritin--  Dx anemia Mail the iFOB back

## 2011-05-07 NOTE — Assessment & Plan Note (Signed)
Doing great! Has lost 25 pounds Labs

## 2011-05-10 ENCOUNTER — Other Ambulatory Visit: Payer: Self-pay | Admitting: Internal Medicine

## 2011-05-10 DIAGNOSIS — Z0000 Encounter for general adult medical examination without abnormal findings: Secondary | ICD-10-CM

## 2011-05-10 NOTE — Telephone Encounter (Signed)
Last OV 05-07-11, last filled 12-11-10 #90

## 2011-05-11 ENCOUNTER — Other Ambulatory Visit: Payer: PRIVATE HEALTH INSURANCE

## 2011-05-11 ENCOUNTER — Encounter: Payer: Self-pay | Admitting: *Deleted

## 2011-05-12 ENCOUNTER — Other Ambulatory Visit: Payer: PRIVATE HEALTH INSURANCE

## 2011-05-28 ENCOUNTER — Other Ambulatory Visit: Payer: Self-pay | Admitting: Internal Medicine

## 2011-05-28 NOTE — Telephone Encounter (Signed)
rx sent to pharmacy by e-script  

## 2011-06-13 ENCOUNTER — Other Ambulatory Visit: Payer: Self-pay | Admitting: Internal Medicine

## 2011-06-14 NOTE — Telephone Encounter (Signed)
rx sent to pharmacy by e-script  

## 2011-06-16 ENCOUNTER — Other Ambulatory Visit: Payer: Self-pay | Admitting: Internal Medicine

## 2011-06-16 NOTE — Telephone Encounter (Signed)
Done

## 2011-07-19 ENCOUNTER — Other Ambulatory Visit: Payer: Self-pay | Admitting: Internal Medicine

## 2011-08-16 ENCOUNTER — Other Ambulatory Visit: Payer: Self-pay | Admitting: *Deleted

## 2011-08-16 MED ORDER — RAMIPRIL 10 MG PO CAPS: ORAL_CAPSULE | ORAL | Status: DC

## 2011-09-06 ENCOUNTER — Ambulatory Visit: Payer: PRIVATE HEALTH INSURANCE | Admitting: Internal Medicine

## 2011-09-06 DIAGNOSIS — Z0289 Encounter for other administrative examinations: Secondary | ICD-10-CM

## 2011-09-08 ENCOUNTER — Other Ambulatory Visit: Payer: Self-pay | Admitting: Internal Medicine

## 2011-09-08 NOTE — Telephone Encounter (Signed)
Refill done.  

## 2011-10-10 ENCOUNTER — Other Ambulatory Visit: Payer: Self-pay | Admitting: Internal Medicine

## 2011-10-11 NOTE — Telephone Encounter (Signed)
Refill done.  

## 2011-10-13 ENCOUNTER — Other Ambulatory Visit: Payer: Self-pay | Admitting: *Deleted

## 2011-10-13 NOTE — Telephone Encounter (Signed)
Pt wife left VM that he has been trying to get refill on Protonix for several days and he is leaving to go out of town in morning. No request on system for refill. Med was last filled by GI ok to refill med..Please advise

## 2011-10-13 NOTE — Telephone Encounter (Signed)
Please send him a letter that we have noticed he did not follow-up with testing as advised. The letter should advise him to call back and arrange for lab tests that are pending and to follow-up in the office.

## 2011-10-14 ENCOUNTER — Other Ambulatory Visit: Payer: Self-pay

## 2011-10-14 DIAGNOSIS — D500 Iron deficiency anemia secondary to blood loss (chronic): Secondary | ICD-10-CM

## 2011-10-14 MED ORDER — PANTOPRAZOLE SODIUM 40 MG PO TBEC: 40.0000 mg | DELAYED_RELEASE_TABLET | Freq: Every day | ORAL | Status: DC

## 2011-10-14 NOTE — Telephone Encounter (Signed)
Yes, RF meds

## 2011-10-14 NOTE — Telephone Encounter (Signed)
Rx sent 

## 2011-10-20 ENCOUNTER — Other Ambulatory Visit: Payer: Self-pay | Admitting: Internal Medicine

## 2011-10-20 NOTE — Telephone Encounter (Signed)
Refill done.  

## 2012-01-08 ENCOUNTER — Other Ambulatory Visit: Payer: Self-pay | Admitting: Internal Medicine

## 2012-01-09 ENCOUNTER — Other Ambulatory Visit: Payer: Self-pay | Admitting: Internal Medicine

## 2012-01-10 NOTE — Telephone Encounter (Signed)
Ok to refill 

## 2012-01-10 NOTE — Telephone Encounter (Signed)
Refill done.  

## 2012-01-10 NOTE — Telephone Encounter (Signed)
Refill xanax x1 only

## 2012-01-21 ENCOUNTER — Other Ambulatory Visit: Payer: Self-pay | Admitting: Internal Medicine

## 2012-01-25 NOTE — Telephone Encounter (Signed)
Refill done.  

## 2012-02-09 ENCOUNTER — Other Ambulatory Visit: Payer: Self-pay | Admitting: Internal Medicine

## 2012-02-10 NOTE — Telephone Encounter (Signed)
Refill done.  

## 2012-03-10 ENCOUNTER — Other Ambulatory Visit: Payer: Self-pay | Admitting: Internal Medicine

## 2012-03-10 NOTE — Telephone Encounter (Signed)
Refill done.  

## 2012-04-01 ENCOUNTER — Other Ambulatory Visit: Payer: Self-pay | Admitting: Internal Medicine

## 2012-04-03 NOTE — Telephone Encounter (Signed)
Refill done.  

## 2012-04-13 ENCOUNTER — Other Ambulatory Visit: Payer: Self-pay | Admitting: Internal Medicine

## 2012-04-13 NOTE — Telephone Encounter (Signed)
Refill done.  

## 2012-05-10 ENCOUNTER — Telehealth: Payer: Self-pay | Admitting: Internal Medicine

## 2012-05-10 NOTE — Telephone Encounter (Signed)
Call pt: Schedule a ROV within 1 month. OK RF x 1 month No further RF w/o OV.

## 2012-05-10 NOTE — Telephone Encounter (Signed)
Pt has not been seen within a year. OK to refill? 

## 2012-05-11 NOTE — Telephone Encounter (Signed)
Discussed with pt & scheduled appt.  Refill done.   [place], please scheduled pt for Monday 1.6.13 @3pm  for CPE.

## 2012-05-12 NOTE — Telephone Encounter (Signed)
Pt scheduled  

## 2012-05-14 ENCOUNTER — Other Ambulatory Visit: Payer: Self-pay | Admitting: Internal Medicine

## 2012-05-15 NOTE — Telephone Encounter (Signed)
Refill done.  

## 2012-05-26 ENCOUNTER — Encounter: Payer: Self-pay | Admitting: Lab

## 2012-05-29 ENCOUNTER — Ambulatory Visit (INDEPENDENT_AMBULATORY_CARE_PROVIDER_SITE_OTHER): Payer: BC Managed Care – PPO | Admitting: Internal Medicine

## 2012-05-29 ENCOUNTER — Encounter: Payer: Self-pay | Admitting: Internal Medicine

## 2012-05-29 VITALS — BP 128/78 | HR 68 | Temp 97.5°F | Ht 68.0 in | Wt 225.0 lb

## 2012-05-29 DIAGNOSIS — F909 Attention-deficit hyperactivity disorder, unspecified type: Secondary | ICD-10-CM

## 2012-05-29 DIAGNOSIS — D509 Iron deficiency anemia, unspecified: Secondary | ICD-10-CM

## 2012-05-29 DIAGNOSIS — E785 Hyperlipidemia, unspecified: Secondary | ICD-10-CM

## 2012-05-29 DIAGNOSIS — K2270 Barrett's esophagus without dysplasia: Secondary | ICD-10-CM

## 2012-05-29 DIAGNOSIS — E119 Type 2 diabetes mellitus without complications: Secondary | ICD-10-CM

## 2012-05-29 DIAGNOSIS — I10 Essential (primary) hypertension: Secondary | ICD-10-CM

## 2012-05-29 DIAGNOSIS — H029 Unspecified disorder of eyelid: Secondary | ICD-10-CM | POA: Insufficient documentation

## 2012-05-29 MED ORDER — AMPHETAMINE-DEXTROAMPHET ER 20 MG PO CP24: ORAL_CAPSULE | ORAL | Status: DC

## 2012-05-29 MED ORDER — ALPRAZOLAM 1 MG PO TABS: 1.0000 mg | ORAL_TABLET | Freq: Every day | ORAL | Status: DC | PRN

## 2012-05-29 NOTE — Assessment & Plan Note (Signed)
Has not been seen in a year, last A1c 11. Only in the last 3 months he has been taking medications regularly. I notice that he is taking glipizide 10 mg 3 tablets daily. He is doing better with diet, has lost weight. Plan: Decreased glipizide 10 mg to 2 tablets daily Labs

## 2012-05-29 NOTE — Patient Instructions (Addendum)
Please go to the lab and sign a controlled substance agreement --- Please come back fasting for labs : Diabetes --- A1c Hypertension --- CMP, TSH High cholesterol --- FLP Anemia --- CBC, iron, ferritin ----- Next visit with me in 3 months

## 2012-05-29 NOTE — Assessment & Plan Note (Signed)
Reports good medication compliance in the last few months, labs

## 2012-05-29 NOTE — Assessment & Plan Note (Addendum)
Needs a refill on adderal, has been out of medications for months due to  cost.

## 2012-05-29 NOTE — Assessment & Plan Note (Signed)
Next EGD 08/2013

## 2012-05-29 NOTE — Assessment & Plan Note (Signed)
Colonoscopy 08-2010 was negative. EGD 08/2010 showed Barrett esophagus. GI liked to do Hemoccults, I don't think that ever happened. Plan: Redo labs, if he still has anemia will need further workup.

## 2012-05-29 NOTE — Progress Notes (Signed)
  Subjective:    Patient ID: Raymond Pugh, male    DOB: Oct 01, 1960, 52 y.o.   MRN: [id number]  HPI Here today for a checkup,. i haven't seen him in a year. For a while had a difficulty time getting  his medication but in the last 3 months he has been taking them routinely.  Diabetes, good medication compliance for the last 3 months, has been loosing some weight on purpose, better diet. No ambulatory CBGs. ADD, has been unable to afford his medication but at this point he has insurance I likes a refill. Anxiety, on Xanax when necessary, hardly ever uses it. Hypertension, reports good medication compliance. Has an eye lesions likes to be removed.  Past Medical History  Diagnosis Date  . Hypertension   . Hyperlipemia   . Diabetes mellitus   . Barrett's esophagus   . Hiatal hernia   . ADHD (attention deficit hyperactivity disorder)   . Anemia   . Myocarditis     h/o mypocarditis, caused cardiomyopathy-- resolved    Past Surgical History  Procedure Date  . Hydrocele excision / repair   . Retinal detachment surgery   . Radiokeratotomy    History   Social History  . Marital Status: Married    Spouse Name: N/A    Number of Children: 2  . Years of Education: N/A   Occupational History  . student, part time job     [employer], Holiday representative   Social History Main Topics  . Smoking status: Never Smoker   . Smokeless tobacco: Never Used  . Alcohol Use: 0.6 oz/week    1 Cans of beer per week     Comment: socially   . Drug Use: No  . Sexually Active: Not on file     Comment: not asked   Other Topics Concern  . Not on file   Social History Narrative   2 children + 2 step children --- Has a part time job, student     Review of Systems No chest pain or shortness of breath No nausea, vomiting, diarrhea. Admits to feeling thirsty sometimes and also increase appetite. GERD symptoms well controlled as long as he takes PPIs. No dysphagia or odynophagia    Objective:   Physical  Exam  Eyes:     General -- alert, well-developed,  BMI 34  Lungs -- normal respiratory effort, no intercostal retractions, no accessory muscle use, and normal breath sounds.   Heart-- normal rate, regular rhythm, no murmur, and no gallop.   Extremities-- no pretibial edema bilaterally Neurologic-- alert & oriented X3 and strength normal in all extremities. Psych-- Cognition and judgment appear intact. Alert and cooperative with normal attention span and concentration.  not anxious appearing and not depressed appearing.       Assessment & Plan:

## 2012-05-29 NOTE — Assessment & Plan Note (Signed)
Symptoms well controlled 

## 2012-05-29 NOTE — Assessment & Plan Note (Signed)
-   Refer to ophthalmology

## 2012-06-06 ENCOUNTER — Other Ambulatory Visit (INDEPENDENT_AMBULATORY_CARE_PROVIDER_SITE_OTHER): Payer: BC Managed Care – PPO

## 2012-06-06 DIAGNOSIS — E119 Type 2 diabetes mellitus without complications: Secondary | ICD-10-CM

## 2012-06-06 DIAGNOSIS — D500 Iron deficiency anemia secondary to blood loss (chronic): Secondary | ICD-10-CM

## 2012-06-06 DIAGNOSIS — I10 Essential (primary) hypertension: Secondary | ICD-10-CM

## 2012-06-06 DIAGNOSIS — D649 Anemia, unspecified: Secondary | ICD-10-CM

## 2012-06-06 DIAGNOSIS — E7800 Pure hypercholesterolemia, unspecified: Secondary | ICD-10-CM

## 2012-06-06 LAB — COMPREHENSIVE METABOLIC PANEL
ALT: 54 U/L — ABNORMAL HIGH (ref 0–53)
CO2: 27 mEq/L (ref 19–32)
Calcium: 8.8 mg/dL (ref 8.4–10.5)
Chloride: 97 mEq/L (ref 96–112)
GFR: 95.66 mL/min (ref >60.00)
Sodium: 134 mEq/L — ABNORMAL LOW (ref 135–145)
Total Bilirubin: 0.6 mg/dL (ref 0.3–1.2)
Total Protein: 7.1 g/dL (ref 6.0–8.3)

## 2012-06-06 LAB — CBC WITH DIFFERENTIAL/PLATELET
Basophils Absolute: 0 10*3/uL (ref 0.0–0.1)
Basophils Relative: 0.5 % (ref 0.0–3.0)
Eosinophils Absolute: 0.4 10*3/uL (ref 0.0–0.7)
HCT: 44.6 % (ref 39.0–52.0)
Lymphocytes Relative: 33.1 % (ref 12.0–46.0)
Monocytes Absolute: 0.6 10*3/uL (ref 0.1–1.0)
Monocytes Relative: 6.5 % (ref 3.0–12.0)
Neutro Abs: 5.5 10*3/uL (ref 1.4–7.7)
Platelets: 239 10*3/uL (ref 150.0–400.0)
WBC: 9.9 10*3/uL (ref 4.5–10.5)

## 2012-06-06 LAB — LIPID PANEL
HDL: 37.8 mg/dL — ABNORMAL LOW (ref >39.00)
LDL Cholesterol: 89 mg/dL (ref 0–99)
Total CHOL/HDL Ratio: 4

## 2012-06-06 LAB — HEMOGLOBIN A1C: Hgb A1c MFr Bld: 13.3 % — ABNORMAL HIGH (ref 4.6–6.5)

## 2012-06-07 ENCOUNTER — Telehealth: Payer: Self-pay | Admitting: *Deleted

## 2012-06-07 NOTE — Telephone Encounter (Signed)
Prior Auth approved 05-08-12 until 06-07-13, pharmacy faxed Pt notified via call left VM.

## 2012-06-15 ENCOUNTER — Other Ambulatory Visit: Payer: Self-pay | Admitting: Internal Medicine

## 2012-06-16 NOTE — Telephone Encounter (Signed)
Refill done.  

## 2012-06-27 ENCOUNTER — Other Ambulatory Visit: Payer: Self-pay | Admitting: Internal Medicine

## 2012-06-27 NOTE — Telephone Encounter (Signed)
Refill done.  

## 2012-06-28 ENCOUNTER — Ambulatory Visit: Payer: BC Managed Care – PPO | Admitting: Internal Medicine

## 2012-06-29 ENCOUNTER — Ambulatory Visit: Payer: BC Managed Care – PPO | Admitting: Internal Medicine

## 2012-07-03 ENCOUNTER — Other Ambulatory Visit: Payer: Self-pay | Admitting: *Deleted

## 2012-07-05 ENCOUNTER — Ambulatory Visit (INDEPENDENT_AMBULATORY_CARE_PROVIDER_SITE_OTHER): Payer: BC Managed Care – PPO | Admitting: Internal Medicine

## 2012-07-05 VITALS — BP 124/76 | HR 80 | Ht 70.0 in | Wt 219.0 lb

## 2012-07-05 DIAGNOSIS — E119 Type 2 diabetes mellitus without complications: Secondary | ICD-10-CM

## 2012-07-05 MED ORDER — "PEN NEEDLES 5/16"" 31G X 8 MM MISC": 1.0000 | Freq: Every day | Status: DC

## 2012-07-05 MED ORDER — INSULIN GLARGINE 100 UNIT/ML ~~LOC~~ SOLN: 15.0000 [IU] | Freq: Every day | SUBCUTANEOUS | Status: DC

## 2012-07-05 NOTE — Progress Notes (Signed)
Subjective:     Patient ID: Raymond Pugh, male   DOB: Oct 22, 1960, 52 y.o.   MRN: [id number]  HPI Raymond Pugh is a pleasant 52 year old man, referred by his PCP, Dr. Drue Novel, for management of DM2, uncontrolled, non-insulin-dependent, with complications (peripheral neuropathy).  Dx with DM2 in 2008. He has not been on Insulin in the past. He has been dieting, mentions he lost ~60 lbs in the last few years. Pt has been absent from Dr. Leta Jungling office for the last 2 years, the last hemoglobin A1c being 11.6% (07/03/2010). At that time he did not have insurance. He was not compliant with his diabetes medication since his last visit, however he became compliant in the last 4 months. At his last visit with Dr. Drue Novel, in 06/06/2012, she was taking metformin 1000 mg po bid and glipizide 20 mg in a.m. and 10 mg in p.m. A hemoglobin A1c returned elevated, as 13.3%. His glipizide was changed to 10 mg bid. He was also referred to me. He now has insurance, and would like to continue to follow with me.   He is now on: - Metformin 1000 mg bid - Glipizide 10 mg bid No lows. He has hypoglycemia awareness at <90. He started to check his sugars at home. He did not check before as he thought he was doing well. Checks in am: low 200, but approximately twice a week. Last time he checked was 4 days ago. He sometimes checks immediately after a meal and noticed sugars in the high 200s. [facility] [employer] and [employer]" - both exercise and education classes. He lost 7 lbs in last 6 weeks. In this class, he is escalating the degree of exercise. He tells me he was "addicted" to regular Coca Cola, drinking 2-3 a day, now only drinks Seltzer water.   He has numbness in his toes >> improved since running and exercising. No kidney dysfunction. Last eye exam: 2007, at the time of his retinal detachment surgery. He is going to see ophthalmology again next week - Dr. Hazle Quant.  He is taking classes -  Printmaker, Ventilation, Sales executive. 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  I reviewed his chart including office notes, telephone notes, labs, and available scanned records. His last TSH obtained on 06/06/2012 was normal. A CMP obtained at the same time was normal, except an increased ALT of 54 (upper limit of normal 53), and the sugar of 393. His kidney function was normal. His CBC was normal, lipid panel 164/184/38/89. He is on pravastatin and fenofibrate.  Review of Systems Constitutional: no weight gain/loss, no fatigue, no subjective hyperthermia/hypothermia, increased thirst Eyes: no blurry vision, no xerophthalmia ENT: no sore throat, no nodules palpated in throat, no dysphagia/odynophagia, no hoarseness Cardiovascular: no CP/SOB/palpitations/leg swelling Respiratory: no cough/SOB Gastrointestinal: no N/V/D/C Musculoskeletal: no muscle/+ joint aches Skin: no rashes Neurological: no tremors/numbness/tingling/dizziness Psychiatric: no depression/anxiety  Past Medical History  Diagnosis Date  . Hypertension   . Hyperlipemia   . Diabetes mellitus   . Barrett's esophagus   . Hiatal hernia   . ADHD (attention deficit hyperactivity disorder)   . Anemia   . Myocarditis     h/o mypocarditis, caused cardiomyopathy-- resolved    Past Surgical History  Procedure Laterality Date  . Hydrocele excision / repair    . Retinal detachment surgery    . Radiokeratotomy     History   Social  History  . Marital Status: Married    Spouse Name: N/A    Number of Children: 2  . Years of Education: N/A   Occupational History  . student, part time job     [employer], Holiday representative   Social History Main Topics  . Smoking status: Never Smoker   . Smokeless tobacco: Never Used  . Alcohol Use: 0.6 oz/week    1 Cans of beer per week     Comment: socially   . Drug Use: No  . Sexually Active: Not on file     Comment: not asked   Other Topics  Concern  . Not on file   Social History Narrative   2 children + 2 step children ---    Has a part time job, student     Current Outpatient Prescriptions on File Prior to Visit  Medication Sig Dispense Refill  . ALPRAZolam (XANAX) 1 MG tablet Take 1 tablet (1 mg total) by mouth daily as needed (for anxiety).  30 tablet  0  . amphetamine-dextroamphetamine (ADDERALL XR) 20 MG 24 hr capsule Take 2 Capsules by mouth every day  60 capsule  0  . fenofibrate micronized (LOFIBRA) 134 MG capsule TAKE ONE CAPSULE BY MOUTH ONCE DAILY  30 capsule  0  . fenofibrate micronized (LOFIBRA) 134 MG capsule TAKE 1 CAPSULE BY MOUTH DAILY  30 capsule  6  . glipiZIDE (GLUCOTROL) 10 MG tablet       . glipiZIDE (GLUCOTROL) 10 MG tablet TAKE 2 TABLETS BY MOUTH EVERY MORNING AND TAKE 1 TABLET BY MOUTH EVERY DAY IN THE AFTERNOON  90 tablet  3  . hydrochlorothiazide (HYDRODIURIL) 25 MG tablet TAKE 1 TABLET BY MOUTH DAILY  30 tablet  6  . metFORMIN (GLUCOPHAGE) 500 MG tablet TAKE 2 TABLETS BY MOUTH TWICE DAILY  120 tablet  3  . metoprolol (LOPRESSOR) 100 MG tablet TAKE 1 TABLET BY MOUTH TWICE DAILY  60 tablet  1  . metoprolol (LOPRESSOR) 100 MG tablet TAKE 1 TABLET BY MOUTH TWICE DAILY  60 tablet  6  . pantoprazole (PROTONIX) 40 MG tablet TAKE 1 TABLET BY MOUTH DAILY 30 MINUTES BEFORE BREAKFAST  30 tablet  6  . pravastatin (PRAVACHOL) 40 MG tablet TAKE 1 TABLET BY MOUTH DAILY  30 tablet  6  . ramipril (ALTACE) 10 MG capsule TAKE 1 CAPSULE BY MOUTH TWICE DAILY  60 capsule  3  . sildenafil (VIAGRA) 50 MG tablet Take 50 mg by mouth daily as needed.         No current facility-administered medications on file prior to visit.   No Known Allergies  Family History  Problem Relation Age of Onset  . Diabetes Father   . Hypertension Father   . Hypertension Mother   . Colon polyps Father   . Asthma Mother   . Prostate cancer Neg Hx     Objective:   Physical Exam BP 124/76  Pulse 80  Ht 5\' 10"  (1.778 m)  Wt 219 lb  (99.338 kg)  BMI 31.42 kg/m2  SpO2 96% Wt Readings from Last 3 Encounters:  07/05/12 219 lb (99.338 kg)  05/29/12 225 lb (102.059 kg)  05/07/11 230 lb (104.327 kg)   Constitutional: overweight, in NAD Eyes: PERRLA, EOMI, no exophthalmos ENT: moist mucous membranes, no thyromegaly, no cervical lymphadenopathy Cardiovascular: RRR, No MRG Respiratory: CTA B Gastrointestinal: abdomen soft, NT, ND, BS+ Musculoskeletal: no deformities, strength intact in all 4 Skin: moist, warm, no rashes Neurological: no tremor with  outstretched hands, DTR normal in all 4     Assessment:     1.  DM2, uncontrolled, non-insulin-dependent, with complications - peripheral neuropathy     Plan:     1. Pt with long-standing diabetes, with increasing HbA1c in the last 2 years, and with medication noncompliance during this period due to finances. He started to institute healthy dietary changes and also to exercise consistently and to take his medications as prescribed. He lost 6 lbs since last visit with PCP and I congratulated him for this. Despite these changes, his sugars are still in the 200s, with the sugar checked today in the office (approximately 4 hours after breakfast) of 250. -  We discussed about the importance of checking his sugars every day, twice a day, both fasting and before lunch dinner and bedtime, rotating times. I explained how to do this, and I gave him CBG logs. - given general instructions for DM2, proper foot care handout, hypoglycemia management handout and brochures about healthy eating, injection sites, sick day rules. - we'll continue his metformin at 1000 mg twice a day, and continue glipizide at 10 mg twice a day - however, he has been on this regimen for a month and his sugars are still in the 200s, while improving his diet and exercising pattern; therefore, I believe that he needs the addition of basal insulin at this time, and I will start him on Lantus 15 units at bedtime. I gave  him 2 Lantus pens and I advised him how to do the injections. He tells me that he will switch from getting his prescription from a regular pharmacy to a mail-order pharmacy, he does not have all the details about this change yet, but I would see him back in a month and we'll send a prescription for Lantus to his pharmacy then. For the next month, he should be fine on his 2 Lantus pens. - He agrees with the plan, and he will return in a month with his log for reevaluation - He tells me that his goal is to get off the insulin, which is a reasonable goal, but for now we'll continue with this - to help him with planning his meals and understanding nutrition goals, we'll refer him to the diabetes education [facility]ieger Reavis - nutrition) - he will see ophthalmology next week - no labs today - RTC in 1 month

## 2012-07-05 NOTE — Patient Instructions (Addendum)
Please return in 1 month with your log. Please start Lantus 15 units under skin each night.  Patient instructions for Diabetes mellitus type 2:  DIET AND EXERCISE Diet and exercise is an important part of diabetic treatment.  We recommended aerobic exercise in the form of brisk walking (working between 40-60% of maximal aerobic capacity, similar to brisk walking) for 150 minutes per week (such as 30 minutes five days per week) along with 3 times per week performing 'resistance' training (using various gauge rubber tubes with handles) 5-10 exercises involving the major muscle groups (upper body, lower body and core) performing 10-15 repetitions (or near fatigue) each exercise. Start at half the above goal but build slowly to reach the above goals. If limited by weight, joint pain, or disability, we recommend daily walking in a swimming pool with water up to waist to reduce pressure from joints while allow for adequate exercise.    BLOOD GLUCOSES Monitoring your blood glucoses is important for continued management of your diabetes. Please check your blood glucoses 3-4 times a day: fasting, before meals and at bedtime (you can rotate these measurements - e.g. one day check before the 3 meals, the next day check before 2 of the meals and before bedtime, etc.   HYPOGLYCEMIA (low blood sugar) Hypoglycemia is usually a reaction to not eating, exercising, or taking too much insulin/ other diabetes drugs.  Symptoms include tremors, sweating, hunger, confusion, headache, etc. Treat IMMEDIATELY with 15 grams of Carbs:   4 glucose tablets    cup regular juice/soda   2 tablespoons raisins   4 teaspoons sugar   1 tablespoon honey Recheck blood glucose in 15 mins and repeat above if still symptomatic/blood glucose <100. Please contact our office at [phone] if you have questions about how to next handle your insulin.  RECOMMENDATIONS TO REDUCE YOUR RISK OF DIABETIC COMPLICATIONS: * Take your  prescribed MEDICATION(S). * Follow a DIABETIC diet: Complex carbs, fiber rich foods, heart healthy fish twice weekly, (monounsaturated and polyunsaturated) fats * AVOID saturated/trans fats, high fat foods, >2,300 mg salt per day. * EXERCISE at least 5 times a week for 30 minutes or preferably daily.  * DO NOT SMOKE OR DRINK more than 1 drink a day. * Check your FEET every day. Do not wear tightfitting shoes. Contact us if you develop an ulcer * See your EYE doctor once a year or more if needed * Get a FLU shot once a year * Get a PNEUMONIA vaccine every 5 years and once after age 36 years  GOALS:  * Your Hemoglobin A1c of 7%  * Your Systolic BP should be 130 or lower  * Your Diastolic BP should be 80 or lower  * Your HDL (Good Cholesterol) should be 40 or higher  * Your LDL (Bad Cholesterol) should be 100 or lower  * Your Triglycerides should be 150 or lower  * Your Urine microalbumin (kidney function) of <30 * Your Body Mass Index should be 25 or lower  We will be glad to help you achieve these goals. Our telephone number is: [phone].

## 2012-07-08 ENCOUNTER — Encounter: Payer: Self-pay | Admitting: Internal Medicine

## 2012-07-12 ENCOUNTER — Telehealth: Payer: Self-pay | Admitting: *Deleted

## 2012-07-12 ENCOUNTER — Other Ambulatory Visit: Payer: Self-pay | Admitting: *Deleted

## 2012-07-12 MED ORDER — GLUCOSE BLOOD VI STRP: ORAL_STRIP | Status: DC

## 2012-07-12 NOTE — Telephone Encounter (Signed)
Patient notified of Freestyle Freedom Test Strips sent to  [facility].

## 2012-07-12 NOTE — Telephone Encounter (Signed)
Pt states that he is now using [facility] for some of his med and we should have received some forms from them.

## 2012-07-13 MED ORDER — METOPROLOL TARTRATE 100 MG PO TABS: ORAL_TABLET | ORAL | Status: DC

## 2012-07-13 MED ORDER — METFORMIN HCL 500 MG PO TABS: ORAL_TABLET | ORAL | Status: DC

## 2012-07-13 MED ORDER — HYDROCHLOROTHIAZIDE 25 MG PO TABS: ORAL_TABLET | ORAL | Status: DC

## 2012-07-13 MED ORDER — FENOFIBRATE MICRONIZED 134 MG PO CAPS: ORAL_CAPSULE | ORAL | Status: DC

## 2012-07-13 MED ORDER — GLIPIZIDE 10 MG PO TABS: ORAL_TABLET | ORAL | Status: DC

## 2012-07-13 MED ORDER — PRAVASTATIN SODIUM 40 MG PO TABS: ORAL_TABLET | ORAL | Status: DC

## 2012-07-13 MED ORDER — RAMIPRIL 10 MG PO CAPS: ORAL_CAPSULE | ORAL | Status: DC

## 2012-07-13 MED ORDER — PANTOPRAZOLE SODIUM 40 MG PO TBEC: DELAYED_RELEASE_TABLET | ORAL | Status: DC

## 2012-07-13 NOTE — Telephone Encounter (Signed)
Refill request for: Metformin hcl 500mg  Fenofibrate 134mg  Ramipril 10mg  Metoprolol tart 100mg  Pravastatin 40mg  Glipizide 10mg  hctz 25mg   pantoprazole 40mg   Refills sent to new pharmacy.

## 2012-07-20 ENCOUNTER — Encounter: Payer: Self-pay | Admitting: Internal Medicine

## 2012-07-25 ENCOUNTER — Ambulatory Visit: Payer: BC Managed Care – PPO | Admitting: *Deleted

## 2012-08-03 ENCOUNTER — Ambulatory Visit: Payer: BC Managed Care – PPO | Admitting: Internal Medicine

## 2012-08-15 ENCOUNTER — Ambulatory Visit: Payer: BC Managed Care – PPO | Admitting: *Deleted

## 2012-08-24 ENCOUNTER — Other Ambulatory Visit: Payer: Self-pay | Admitting: *Deleted

## 2012-08-24 DIAGNOSIS — E119 Type 2 diabetes mellitus without complications: Secondary | ICD-10-CM

## 2012-08-24 MED ORDER — INSULIN GLARGINE 100 UNIT/ML ~~LOC~~ SOLN: 15.0000 [IU] | Freq: Every day | SUBCUTANEOUS | Status: DC

## 2012-08-24 NOTE — Telephone Encounter (Signed)
Patient missed his appt on 08/03/12. Needs a refill of his Lantus solostar sent to [facility]. In the meantime, pt states he is going to run out of insulin. I advised pt to come by the office and pick up a couple of samples to get by until his rx arrives in the mail.  Pt's appt is re-scheduled for 08/28/12 at 4:00 pm.

## 2012-08-28 ENCOUNTER — Ambulatory Visit (INDEPENDENT_AMBULATORY_CARE_PROVIDER_SITE_OTHER): Payer: BC Managed Care – PPO | Admitting: Internal Medicine

## 2012-08-28 ENCOUNTER — Ambulatory Visit: Payer: BC Managed Care – PPO | Admitting: Internal Medicine

## 2012-08-28 ENCOUNTER — Encounter: Payer: Self-pay | Admitting: Internal Medicine

## 2012-08-28 VITALS — BP 118/64 | HR 72 | Temp 98.4°F | Resp 10 | Wt 226.0 lb

## 2012-08-28 DIAGNOSIS — E119 Type 2 diabetes mellitus without complications: Secondary | ICD-10-CM

## 2012-08-28 DIAGNOSIS — Z0289 Encounter for other administrative examinations: Secondary | ICD-10-CM

## 2012-08-28 MED ORDER — INSULIN GLARGINE 100 UNIT/ML ~~LOC~~ SOLN: 20.0000 [IU] | Freq: Every day | SUBCUTANEOUS | Status: DC

## 2012-08-28 NOTE — Patient Instructions (Addendum)
Please return in 2 months with your sugar log.  Please increase the Lantus to 20 units. Please join MyChart and you can send me your sugars through there.

## 2012-08-28 NOTE — Progress Notes (Signed)
Subjective:     Patient ID: Raymond Pugh, male   DOB: [birth year], 52 y.o.   MRN: [id number]  HPI Raymond Pugh is a pleasant 52 year old man,52 returning for f/u for management of DM2, dx 2008, uncontrolled, non-insulin-dependent, with complications (peripheral neuropathy).  He has been dieting, mentions he lost ~60 lbs in the last few years. Pt has been absent from Raymond Pugh office for the last 2 years, the last hemoglobin A1c being 11.6% (07/03/2010). At that time he did not have insurance. He was not compliant with his diabetes medication since his last visit, however he became compliant in the last 4 months. At his last visit with Raymond Pugh, in 06/06/2012, she was taking metformin 1000 mg po bid and glipizide 20 mg in a.m. and 10 mg in p.m. A hemoglobin A1c returned elevated, as 13.3%. His glipizide was changed to 10 mg bid. He was also referred to me. At last visit, we added Lantus 15 units in at bedtime, as his sugars were still in the 200 despite increased exercise and instituting healthy changes in his diet. E will see Raymond Pugh with Nutrition tomorrow.   He is now on: - Metformin 1000 mg bid - Glipizide 10 mg bid - Lantus 15 units in at bedtime No lows. He has hypoglycemia awareness at <90.  He checks his sugars approximately one time a day, some days with more measurements but also he has days in a row without taking, which he attributes to winter storm. His sugars in a.m. are between 190 and 250 and stay high throughout the day, usually <250.  He has numbness in his toes >> improved since running and exercising. No kidney dysfunction. Last eye exam: 2007, at the time of his retinal detachment surgery. He saw ophthalmology - Raymond Pugh on 08/21/2012.  [facility] [employer] and [employer]" - both exercise and education classes. He lost 7 lbs in last 6 weeks. In this class, he is escalating the degree of exercise. He goes there 2-3 x a week, but tries to  go every morning. He is also following the [email or web address] recommendations.   He is taking classes - Printmaker, Ventilation, Sales executive. 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  His last TSH obtained on 06/06/2012 was normal. A CMP obtained at the same time was normal, except an increased ALT of 54 (upper limit of normal 53), and the sugar of 393. His kidney function was normal. His CBC was normal, lipid panel 164/184/38/89. He is on pravastatin and fenofibrate.  Review of Systems Constitutional: + weight gain, no fatigue, no subjective hyperthermia/hypothermia, increased thirst Eyes: no blurry vision, no xerophthalmia ENT: no sore throat, no nodules palpated in throat, no dysphagia/odynophagia, no hoarseness Cardiovascular: no CP/SOB/palpitations/leg swelling Respiratory: no cough/SOB Gastrointestinal: no N/V/D/C Musculoskeletal: no muscle/+ joint aches Skin: no rashes Neurological: no tremors/numbness/tingling/dizziness Psychiatric: no depression/anxiety  I reviewed pt's medications, allergies, PMH, social hx, family hx and no changes required.  Objective:   Physical Exam BP 118/64  Pulse 72  Temp(Src) 98.4 F (36.9 C) (Oral)  Resp 10  Wt 226 lb (102.513 kg)  BMI 32.43 kg/m2  SpO2 96% Wt Readings from Last 3 Encounters:  08/28/12 226 lb (102.513 kg)  07/05/12 219 lb (99.338 kg)  05/29/12 225 lb (102.059 kg)  Constitutional: overweight, in NAD Eyes: PERRLA, EOMI, no exophthalmos ENT: moist mucous membranes, no thyromegaly, no cervical lymphadenopathy Cardiovascular: RRR, No  MRG Respiratory: CTA B Gastrointestinal: abdomen soft, NT, ND, BS+ Musculoskeletal: no deformities, strength intact in all 4 Skin: moist, warm, no rashes Neurological: no tremor with outstretched hands, DTR normal in all 4   Assessment:     1.  DM2, uncontrolled, non-insulin-dependent, with complications - peripheral neuropathy  Plan:     1. Pt  with long-standing diabetes, with increasing HbA1c in the last 2 years, and with medication noncompliance during this period due to finances. He started to institute healthy dietary changes and also to exercise consistently and to take his medications as prescribed. Despite these changes, his sugars were still in the 200s at last visit, so we had to add Lantus 15 units at bedtime. Now, his sugars are a little better, but still high in a.m., between 190 and 250, and they stay high throughout the day. - I discussed with the patient to increase the number of CBG checks a day, as he only has a few in the last 4 weeks - we'll increase the Lantus to 20 units at bedtime - given sample Lantus pen - we'll continue his metformin at 1000 mg twice a day, and continue glipizide at 10 mg twice a day - I advised him to join my chart, and he appears interested in this - I advised him to send me his sugars through my chart in 1 or 2 weeks - he has an appointment with nutrition tomorrow - no labs today - RTC in 2 months with his sugar log

## 2012-08-29 ENCOUNTER — Ambulatory Visit: Payer: BC Managed Care – PPO | Admitting: *Deleted

## 2012-10-09 ENCOUNTER — Telehealth: Payer: Self-pay | Admitting: Internal Medicine

## 2012-10-09 NOTE — Telephone Encounter (Signed)
Patient called requesting new rx for adderall 20mg  XR and alprazolam 1mg . Patient states he takes both of these prn but would like the same qty as last time. Patient uses [facility] on [address] Call [phone] when ready for pick up.

## 2012-10-10 ENCOUNTER — Other Ambulatory Visit: Payer: Self-pay | Admitting: *Deleted

## 2012-10-10 DIAGNOSIS — E119 Type 2 diabetes mellitus without complications: Secondary | ICD-10-CM

## 2012-10-10 MED ORDER — INSULIN GLARGINE 100 UNIT/ML ~~LOC~~ SOLN: 20.0000 [IU] | Freq: Every day | SUBCUTANEOUS | Status: DC

## 2012-10-10 NOTE — Telephone Encounter (Signed)
Last OV 05-29-12, last refilled # 30 alprazolam, #60 adderall

## 2012-10-10 NOTE — Telephone Encounter (Signed)
Okay adderall #60 and alprazolam #30

## 2012-10-10 NOTE — Telephone Encounter (Signed)
Rx did not go through electronically to [facility]. Rx sent to [facility], too.

## 2012-10-11 MED ORDER — AMPHETAMINE-DEXTROAMPHET ER 20 MG PO CP24: ORAL_CAPSULE | ORAL | Status: DC

## 2012-10-11 MED ORDER — ALPRAZOLAM 1 MG PO TABS: 1.0000 mg | ORAL_TABLET | Freq: Every day | ORAL | Status: DC | PRN

## 2012-10-11 NOTE — Telephone Encounter (Signed)
Refill done.  

## 2012-10-12 ENCOUNTER — Other Ambulatory Visit: Payer: Self-pay | Admitting: *Deleted

## 2012-10-12 MED ORDER — INSULIN GLARGINE 100 UNIT/ML SOLOSTAR PEN: PEN_INJECTOR | SUBCUTANEOUS | Status: DC

## 2012-10-12 NOTE — Telephone Encounter (Signed)
Rx sent for vials. Pt gets pens. Resending to [facility].

## 2012-10-30 ENCOUNTER — Encounter: Payer: Self-pay | Admitting: Internal Medicine

## 2012-10-30 ENCOUNTER — Ambulatory Visit (INDEPENDENT_AMBULATORY_CARE_PROVIDER_SITE_OTHER): Payer: BC Managed Care – PPO | Admitting: Internal Medicine

## 2012-10-30 VITALS — BP 112/76 | HR 92 | Temp 97.8°F | Resp 12 | Ht 70.0 in | Wt 221.0 lb

## 2012-10-30 DIAGNOSIS — E119 Type 2 diabetes mellitus without complications: Secondary | ICD-10-CM

## 2012-10-30 MED ORDER — GLUCOSE BLOOD VI STRP: ORAL_STRIP | Status: DC

## 2012-10-30 MED ORDER — ONETOUCH ULTRASOFT LANCETS MISC: Status: DC

## 2012-10-30 MED ORDER — "INSULIN SYRINGE-NEEDLE U-100 30G X 1/2"" 0.5 ML MISC": Status: DC

## 2012-10-30 MED ORDER — ONETOUCH ULTRA SYSTEM W/DEVICE KIT: PACK | Status: DC

## 2012-10-30 NOTE — Patient Instructions (Addendum)
Please return in 1 month with your sugar log.

## 2012-10-30 NOTE — Progress Notes (Signed)
Subjective:     Patient ID: Raymond Pugh, male   DOB: [birth year], 52 y.o.   MRN: [id number]  HPI Raymond Pugh is a pleasant 52 year old man,returning for f/u for management of DM2, dx 2008, uncontrolled, insulin-dependent, with complications (peripheral neuropathy). Last visit 2 mo ago.  Last A hemoglobin A1c: Lab Results  Component Value Date   HGBA1C 13.3* 06/06/2012   In 06/2012 we added Lantus 15 units  at bedtime, as his sugars were still in the 200 despite increased exercise and instituting healthy changes in his diet. He had an appt with Denny Levy with Nutrition, but he missed it.  He is now on: - Metformin 1000 mg bid - Glipizide 10 mg bid - Lantus 20 units in at bedtime, increased form 15 at last visit No lows. He has hypoglycemia awareness at <90.  He was checking his sugars approximately one time a day, but not in the last 2 months as he lost his meter. At last visit, his sugars were: - a.m.:190-250  - later in the day: <250.  He has numbness in his toes >> improved since running and exercising. No kidney dysfunction. He is on Ramipril. Last eye exam: 09/2012, Dr. Hazle Quant. He has had retinal detachment surgery.   [facility] [employer] - both exercise and education classes. He lost 7 lbs in last 6 weeks. He was going there 2-3 x a week, not lately, since he worked out of town and was very busy. 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  His last TSH obtained on 06/06/2012 was normal. A CMP obtained at the same time was normal, except an increased ALT of 54 (upper limit of normal 53), and the sugar of 393. His kidney function was normal. His CBC was normal, lipid panel 164/184/38/89. He is on pravastatin and fenofibrate.  Review of Systems Constitutional: + weight gain, no fatigue, no subjective hyperthermia/hypothermia, increased thirst Eyes: no blurry vision, no xerophthalmia, anisocoria - surgical pupil on the  left ENT: no sore throat, no nodules palpated in throat, no dysphagia/odynophagia, no hoarseness Cardiovascular: no CP/SOB/palpitations/leg swelling Respiratory: no cough/SOB Gastrointestinal: no N/V/D/C Musculoskeletal: no muscle/+ joint aches Skin: no rashes Neurological: no tremors/numbness/tingling/dizziness Psychiatric: no depression/anxiety  I reviewed pt's medications, allergies, PMH, social hx, family hx and no changes required.  Objective:   Physical Exam BP 112/76  Pulse 92  Temp(Src) 97.8 F (36.6 C) (Oral)  Resp 12  Ht 5\' 10"  (1.778 m)  Wt 221 lb (100.245 kg)  BMI 31.71 kg/m2  SpO2 97% Wt Readings from Last 3 Encounters:  10/30/12 221 lb (100.245 kg)  08/28/12 226 lb (102.513 kg)  07/05/12 219 lb (99.338 kg)  Constitutional: overweight, in NAD Eyes: PERRLA, EOMI, no exophthalmos ENT: moist mucous membranes, no thyromegaly, no cervical lymphadenopathy Cardiovascular: RRR, No MRG Respiratory: CTA B Gastrointestinal: abdomen soft, NT, ND, BS+ Musculoskeletal: no deformities, strength intact in all 4 Skin: moist, warm, no rashes Neurological: no tremor with outstretched hands, DTR normal in all 4   Assessment:     1.  DM2, uncontrolled, insulin-dependent, with complications - peripheral neuropathy  Plan:     1. Pt with long-standing diabetes, with increasing HbA1c in the last 2 years, and with medication noncompliance during this period due to finances. He was doing well this spring, however for the last 2 visits, he fell off the widened for his diet and exercise and also in the last  2 months he did not write his sugars down as he was working out of town and lost his meter. He continues on Lantus, increased to 20 units at last visit. - I discussed with the patient that we absolutely need CBG checks to adjust his insulin doses - Given your sugar logs - centimeters in for a meter, lancets, and strips (One Touch ultra) to his pharmacy - Given Lantus sample pens  x2 - he got Lantus vials for 3 months from his mail-order pharmacy, so I sent syringes to his pharmacy and will resume the pens after he runs out of his Lantus vials - we'll continue the Lantus to 20 units at bedtime - given sample Lantus pen - we'll continue his metformin at 1000 mg twice a day, and continue glipizide at 10 mg twice a day - I advised him to join my chart, given new code - I advised him to send me his sugars through my chart - Will check Hba1C today - RTC in 3 months with his sugar log  Office Visit on 10/30/2012  Component Date Value Range Status  . Hemoglobin A1C 10/30/2012 10.9* 4.6 - 6.5 % Final   Glycemic Control Guidelines for People with Diabetes:Non Diabetic:  <6%Goal of Therapy: <7%Additional Action Suggested:  >8%    Improved, but still far from goal. Will call pt.

## 2012-10-31 ENCOUNTER — Telehealth: Payer: Self-pay | Admitting: *Deleted

## 2012-10-31 ENCOUNTER — Encounter: Payer: Self-pay | Admitting: Internal Medicine

## 2012-10-31 LAB — HEMOGLOBIN A1C: Hgb A1c MFr Bld: 10.9 % — ABNORMAL HIGH (ref 4.6–6.5)

## 2012-10-31 NOTE — Telephone Encounter (Signed)
Called pt and lvm that his HgbA1C is better, down to 10.9 from 13.3, but still far from goal. Continue on the same medication regiment and watch your diet. If you have any problems or questions give [place] a call.

## 2012-11-28 ENCOUNTER — Ambulatory Visit: Payer: BC Managed Care – PPO | Admitting: Internal Medicine

## 2012-11-28 DIAGNOSIS — Z0289 Encounter for other administrative examinations: Secondary | ICD-10-CM

## 2012-12-01 ENCOUNTER — Other Ambulatory Visit: Payer: Self-pay | Admitting: *Deleted

## 2012-12-01 ENCOUNTER — Other Ambulatory Visit: Payer: Self-pay | Admitting: Internal Medicine

## 2012-12-01 ENCOUNTER — Encounter: Payer: Self-pay | Admitting: *Deleted

## 2012-12-01 MED ORDER — RAMIPRIL 10 MG PO CAPS: ORAL_CAPSULE | ORAL | Status: DC

## 2012-12-01 MED ORDER — PANTOPRAZOLE SODIUM 40 MG PO TBEC: DELAYED_RELEASE_TABLET | ORAL | Status: DC

## 2012-12-01 MED ORDER — FENOFIBRATE MICRONIZED 134 MG PO CAPS: ORAL_CAPSULE | ORAL | Status: DC

## 2012-12-01 NOTE — Telephone Encounter (Signed)
Pt left VM that he is leaving to go out of town and his mail order will not get to him in time. Pt request Rx sent into local pharmacy. Pt aware.

## 2012-12-01 NOTE — Telephone Encounter (Signed)
Refill done.  Letter mailed to pt to make aware he is due for an OV.

## 2012-12-28 ENCOUNTER — Ambulatory Visit: Payer: BC Managed Care – PPO | Admitting: Internal Medicine

## 2013-01-06 ENCOUNTER — Other Ambulatory Visit: Payer: Self-pay | Admitting: Internal Medicine

## 2013-01-08 NOTE — Telephone Encounter (Signed)
Ok to refill glipizide, metformin and metoprolol for 6 months per protocol? Last OV with Dr.Paz. 1.6.14 due for OV with you per protocol however,  Pt. Has been followed by Dr. Lafe Garin with Endo last OV 6.9.14 and had A1c checked at that time.  We last sent 6 month supplies of all three medications on 07/13/2012 Please advise.

## 2013-01-08 NOTE — Telephone Encounter (Signed)
Okay metoprolol 90 days Glipizide and metformin to be refilled by  endocrinology. Needs to schedule a  office visit

## 2013-01-09 NOTE — Telephone Encounter (Signed)
Will forward glipizide and metformin to endocrinology per MD request.

## 2013-01-11 ENCOUNTER — Other Ambulatory Visit: Payer: Self-pay | Admitting: Internal Medicine

## 2013-01-11 NOTE — Telephone Encounter (Signed)
Ok 90, no RF, no further RF w/o OV

## 2013-01-11 NOTE — Telephone Encounter (Signed)
Please advise if ok to refill Hydrochlorothiazide? Last OV 1.6.14 Last filled 07/13/12 #90 and 1 refill.  Pt was mailed letter on 12/01/12 to make pt. Aware of need for appt. Pt. Canceled appointment on 12/28/12. No Pending appointment scheduled.  Pt. Requests 90 days supply

## 2013-01-11 NOTE — Telephone Encounter (Signed)
Refill done per order. 

## 2013-01-15 ENCOUNTER — Other Ambulatory Visit: Payer: Self-pay | Admitting: *Deleted

## 2013-01-15 MED ORDER — INSULIN GLARGINE 100 UNIT/ML SOLOSTAR PEN: PEN_INJECTOR | SUBCUTANEOUS | Status: DC

## 2013-01-15 NOTE — Telephone Encounter (Signed)
Rx refill

## 2013-01-23 ENCOUNTER — Other Ambulatory Visit: Payer: Self-pay | Admitting: Endocrinology

## 2013-01-23 ENCOUNTER — Telehealth: Payer: Self-pay | Admitting: Internal Medicine

## 2013-01-23 NOTE — Telephone Encounter (Signed)
Opened in error

## 2013-01-24 ENCOUNTER — Telehealth: Payer: Self-pay | Admitting: Internal Medicine

## 2013-01-24 NOTE — Telephone Encounter (Signed)
[facility], please call about Glipizide tabs 10mg . CB# [phone]. Reff# [id number]

## 2013-01-24 NOTE — Telephone Encounter (Signed)
Called [facility], per pt. Clarified the sig for the pharmacist.

## 2013-01-25 ENCOUNTER — Other Ambulatory Visit: Payer: Self-pay | Admitting: Internal Medicine

## 2013-01-25 NOTE — Telephone Encounter (Signed)
Med filled. Pt NEEDs an OV>

## 2013-02-15 ENCOUNTER — Other Ambulatory Visit: Payer: Self-pay | Admitting: General Practice

## 2013-02-15 NOTE — Telephone Encounter (Signed)
Alprazolam Last filled 10-11-12 #30 with 0 refills Adderall last filled 10-11-12 #60 with 0 refills Last OV 05/2012 (appt made for 03/12/13)  Moderate Risk

## 2013-02-20 ENCOUNTER — Telehealth: Payer: Self-pay | Admitting: General Practice

## 2013-02-20 MED ORDER — ALPRAZOLAM 1 MG PO TABS: 1.0000 mg | ORAL_TABLET | Freq: Every day | ORAL | Status: DC | PRN

## 2013-02-20 MED ORDER — AMPHETAMINE-DEXTROAMPHET ER 20 MG PO CP24: ORAL_CAPSULE | ORAL | Status: DC

## 2013-02-20 NOTE — Telephone Encounter (Signed)
Ok RF x 1 month (xanax #30, no RF, adderall #60, no RF) Needs UDS ASAP Needs OV--- please arrange, no further Rx w/o visit be sure pt knows

## 2013-02-20 NOTE — Telephone Encounter (Signed)
Med filled and placed on Paz's ledge for signature. Pt notified will be available today.

## 2013-02-20 NOTE — Telephone Encounter (Signed)
Pt called today to check on the status of his adderall and alprazolam refills. Request was sent to paz on 02/15/2013.    Last OV 05-29-12 (appt for Oct) Med filled: alprazolam- 10-11-2012 #30 with 0 refills                  adderall- 10-11-2012 #60 with 0 refills   Moderate Risk, +THC

## 2013-02-27 ENCOUNTER — Telehealth: Payer: Self-pay | Admitting: Internal Medicine

## 2013-02-27 NOTE — Telephone Encounter (Signed)
Call was escalated due to patient's concerns and inability to calm down with the CMA. Patient was using inappropriate language to staff.  When spoken with the patient was loud in tone and was denying any use of illicit drugs. Patient states that he wants it removed from his record.  Advised patient that we could not remove. Advised patient that if he felt that his results were in error that he should have a repeat UDS. States -that he is not avilable to come by and have the test repeated. Referred patient to [facility] and gave the hours of  M-TH 8-830 and F-Su 8-6 to repeat the UDS. Patient states that he will go by and have it done. Apologized for his behavior because he does not use illegal drugs and  he is very upset that this could happen. Requested to speak with Dr. Drue Novel but advised that he was unavailable.

## 2013-02-27 NOTE — Telephone Encounter (Signed)
Spoke with patient concerning his UDS results from 02/22/2013. Patient was very upset when I told him that the UDS was positive for [facility] and cocaine. Patient said he wanted it removed from his medical record because "it was not correct". He became hostile and began to use profanity. Call was handle by RN.

## 2013-02-27 NOTE — Telephone Encounter (Signed)
Called and left message for patient to return our call at his earliest convenience.

## 2013-02-27 NOTE — Telephone Encounter (Signed)
UDS from 02/22/2013 was negative for xanax-adderall, positive for THC and cocaine. Although it is possible that he is taking Adderall and Xanax as needed, I feel very uncomfortable prescribing such medications then given results. Please call patient: I won't Rx any other controlled substances . The testing showed illegal drugs in his systems, I recommend counseling for substance abuse --->  if the patient is agreeable please let me know

## 2013-03-12 ENCOUNTER — Ambulatory Visit: Payer: BC Managed Care – PPO | Admitting: Internal Medicine

## 2013-03-12 DIAGNOSIS — Z0289 Encounter for other administrative examinations: Secondary | ICD-10-CM

## 2013-03-27 ENCOUNTER — Other Ambulatory Visit: Payer: Self-pay | Admitting: Internal Medicine

## 2013-04-09 ENCOUNTER — Other Ambulatory Visit: Payer: Self-pay | Admitting: Internal Medicine

## 2013-04-09 ENCOUNTER — Telehealth: Payer: Self-pay | Admitting: Internal Medicine

## 2013-04-09 NOTE — Telephone Encounter (Signed)
Patient wife called and requested refill's for her husband Medication List 1)Metoprolol (100mg ) 2)Fenofilbrate (134mg ) 3)Pantoprazole (40mg ) 4)hydrochlorothiazide 5)ramipril (40mg ) 6)metformin (500mg ) if possible to change to (1000mg ) because he takes them twice a day. 7)Glipizide (10mg )  Pharmacy [facility] home delivery

## 2013-04-09 NOTE — Telephone Encounter (Signed)
Hydrochlorothiazide and Metoprolol refilled per protocol. OV needed

## 2013-04-10 ENCOUNTER — Other Ambulatory Visit: Payer: Self-pay | Admitting: *Deleted

## 2013-04-10 MED ORDER — PANTOPRAZOLE SODIUM 40 MG PO TBEC: DELAYED_RELEASE_TABLET | ORAL | Status: DC

## 2013-04-10 MED ORDER — GLIPIZIDE 10 MG PO TABS: 10.0000 mg | ORAL_TABLET | Freq: Two times a day (BID) | ORAL | Status: DC

## 2013-04-10 MED ORDER — FENOFIBRATE MICRONIZED 134 MG PO CAPS: 134.0000 mg | ORAL_CAPSULE | Freq: Every day | ORAL | Status: DC

## 2013-04-10 MED ORDER — RAMIPRIL 10 MG PO CAPS: ORAL_CAPSULE | ORAL | Status: DC

## 2013-04-10 MED ORDER — METFORMIN HCL 1000 MG PO TABS: 1000.0000 mg | ORAL_TABLET | Freq: Every day | ORAL | Status: DC

## 2013-04-10 NOTE — Telephone Encounter (Signed)
Fenofibrate, pantoprazole, Ramipril, Metformin, and Glipizide refilled per protocol. OV due.

## 2013-04-10 NOTE — Telephone Encounter (Signed)
Meds refilled. Only 30 day supply because patient is due for an office visit.

## 2013-04-25 ENCOUNTER — Encounter: Payer: Self-pay | Admitting: Internal Medicine

## 2013-06-04 ENCOUNTER — Telehealth: Payer: Self-pay | Admitting: Internal Medicine

## 2013-06-04 NOTE — Telephone Encounter (Signed)
Patient is calling for refills on his medications. I asked him which ones and he states "all of my medications need to be refilled." States that he is completely out of Metroprolol. Patient uses [facility]. Please advise.

## 2013-06-05 ENCOUNTER — Other Ambulatory Visit: Payer: Self-pay | Admitting: *Deleted

## 2013-06-05 MED ORDER — METOPROLOL TARTRATE 100 MG PO TABS: ORAL_TABLET | ORAL | Status: DC

## 2013-06-05 MED ORDER — PANTOPRAZOLE SODIUM 40 MG PO TBEC: DELAYED_RELEASE_TABLET | ORAL | Status: DC

## 2013-06-05 MED ORDER — METFORMIN HCL 1000 MG PO TABS: 1000.0000 mg | ORAL_TABLET | Freq: Every day | ORAL | Status: DC

## 2013-06-05 MED ORDER — RAMIPRIL 10 MG PO CAPS: ORAL_CAPSULE | ORAL | Status: DC

## 2013-06-05 MED ORDER — HYDROCHLOROTHIAZIDE 25 MG PO TABS: ORAL_TABLET | ORAL | Status: DC

## 2013-06-05 MED ORDER — FENOFIBRATE MICRONIZED 134 MG PO CAPS: ORAL_CAPSULE | ORAL | Status: DC

## 2013-06-05 NOTE — Telephone Encounter (Signed)
Medications were refilled for one month only. Pt is overdue for an OV. JG//CMA

## 2013-07-16 ENCOUNTER — Other Ambulatory Visit: Payer: Self-pay | Admitting: Internal Medicine

## 2013-07-20 ENCOUNTER — Other Ambulatory Visit: Payer: Self-pay | Admitting: Internal Medicine

## 2013-08-14 ENCOUNTER — Other Ambulatory Visit: Payer: Self-pay | Admitting: Internal Medicine

## 2013-08-19 ENCOUNTER — Other Ambulatory Visit: Payer: Self-pay | Admitting: Internal Medicine

## 2013-08-30 ENCOUNTER — Encounter: Payer: Self-pay | Admitting: Internal Medicine

## 2013-09-03 ENCOUNTER — Other Ambulatory Visit: Payer: Self-pay | Admitting: Internal Medicine

## 2013-09-14 ENCOUNTER — Other Ambulatory Visit: Payer: Self-pay | Admitting: *Deleted

## 2013-09-14 MED ORDER — HYDROCHLOROTHIAZIDE 25 MG PO TABS: ORAL_TABLET | ORAL | Status: DC

## 2013-09-14 MED ORDER — METFORMIN HCL 500 MG PO TABS: ORAL_TABLET | ORAL | Status: DC

## 2013-09-14 MED ORDER — FENOFIBRATE MICRONIZED 134 MG PO CAPS: 134.0000 mg | ORAL_CAPSULE | Freq: Every day | ORAL | Status: DC

## 2013-09-14 MED ORDER — RAMIPRIL 10 MG PO CAPS: ORAL_CAPSULE | ORAL | Status: DC

## 2013-09-14 MED ORDER — METOPROLOL TARTRATE 100 MG PO TABS: ORAL_TABLET | ORAL | Status: DC

## 2013-09-18 ENCOUNTER — Other Ambulatory Visit: Payer: Self-pay | Admitting: Internal Medicine

## 2013-09-28 ENCOUNTER — Ambulatory Visit: Payer: BC Managed Care – PPO | Admitting: Internal Medicine

## 2013-09-28 DIAGNOSIS — Z0289 Encounter for other administrative examinations: Secondary | ICD-10-CM

## 2013-10-04 ENCOUNTER — Other Ambulatory Visit: Payer: Self-pay | Admitting: Internal Medicine

## 2013-10-04 NOTE — Telephone Encounter (Signed)
Rx sent to the pharmacy by [facility].  Pt needs office visit for DM.//AB/CMA

## 2013-10-08 ENCOUNTER — Encounter: Payer: Self-pay | Admitting: *Deleted

## 2013-10-08 ENCOUNTER — Telehealth: Payer: Self-pay | Admitting: *Deleted

## 2013-10-08 NOTE — Telephone Encounter (Signed)
Message copied by Chilton Greathouse on Mon Oct 08, 2013  7:27 AM ------      Message from: Kathlene November E      Created: Fri Sep 28, 2013 10:31 AM      Regarding: no show       I believe this is his second no show, if that is the case start the process to discharge him from the office ------

## 2013-10-08 NOTE — Telephone Encounter (Signed)
Dismissal letter printed per Dr. Larose Kells recommendations. See note below.

## 2013-10-19 ENCOUNTER — Other Ambulatory Visit: Payer: Self-pay | Admitting: Internal Medicine

## 2013-10-20 ENCOUNTER — Other Ambulatory Visit: Payer: Self-pay | Admitting: Internal Medicine

## 2013-11-16 ENCOUNTER — Telehealth: Payer: Self-pay | Admitting: *Deleted

## 2013-11-16 ENCOUNTER — Other Ambulatory Visit: Payer: Self-pay | Admitting: *Deleted

## 2013-11-16 ENCOUNTER — Telehealth: Payer: Self-pay | Admitting: Internal Medicine

## 2013-11-16 DIAGNOSIS — E119 Type 2 diabetes mellitus without complications: Secondary | ICD-10-CM

## 2013-11-16 MED ORDER — INSULIN PEN NEEDLE 32G X 4 MM MISC: Status: DC

## 2013-11-16 NOTE — Telephone Encounter (Signed)
Spoke with patient who called the office demanding that "all his medicines be refilled." After reviewing the chart it appears that the patient has been dismissed from the practice for multiple no show's. Patient stated that he never received a letter from [place] stating that. I advised that the letter would be resent. Patient advised to seek treatment in a local urgent care until he is able to get established with a new provider.

## 2013-11-16 NOTE — Telephone Encounter (Signed)
Pt needs the BD ultra fine needles called in to [facility] please

## 2013-11-17 ENCOUNTER — Other Ambulatory Visit: Payer: Self-pay | Admitting: Internal Medicine

## 2013-12-01 ENCOUNTER — Other Ambulatory Visit: Payer: Self-pay | Admitting: Internal Medicine

## 2013-12-17 ENCOUNTER — Other Ambulatory Visit: Payer: Self-pay | Admitting: Internal Medicine

## 2013-12-17 NOTE — Telephone Encounter (Signed)
Patient did not receive dismissal letter. Letter resent per site Freight forwarder. Meds refilled for 30 days only.

## 2013-12-18 ENCOUNTER — Telehealth: Payer: Self-pay | Admitting: Internal Medicine

## 2013-12-18 NOTE — Telephone Encounter (Signed)
Dismissal Letter sent Certified Mail [phone]  Received the Return Receipt showing someone picked up the Dismissal 12/24/2013

## 2013-12-24 ENCOUNTER — Other Ambulatory Visit: Payer: Self-pay | Admitting: Internal Medicine

## 2014-01-04 ENCOUNTER — Telehealth: Payer: Self-pay | Admitting: Internal Medicine

## 2014-01-04 DIAGNOSIS — E119 Type 2 diabetes mellitus without complications: Secondary | ICD-10-CM

## 2014-01-04 MED ORDER — FENOFIBRATE MICRONIZED 134 MG PO CAPS: ORAL_CAPSULE | ORAL | Status: DC

## 2014-01-04 MED ORDER — RAMIPRIL 10 MG PO CAPS: ORAL_CAPSULE | ORAL | Status: DC

## 2014-01-04 MED ORDER — METOPROLOL TARTRATE 100 MG PO TABS: ORAL_TABLET | ORAL | Status: DC

## 2014-01-04 MED ORDER — METFORMIN HCL 500 MG PO TABS: ORAL_TABLET | ORAL | Status: DC

## 2014-01-04 MED ORDER — GLIPIZIDE 10 MG PO TABS: 10.0000 mg | ORAL_TABLET | Freq: Two times a day (BID) | ORAL | Status: DC

## 2014-01-04 MED ORDER — HYDROCHLOROTHIAZIDE 25 MG PO TABS: ORAL_TABLET | ORAL | Status: DC

## 2014-01-04 NOTE — Telephone Encounter (Signed)
Caller name: Oliverio Relation to pt: self  Call back number: [phone]  Reason for call: pt received dismissed letter 12/17/13 and it states that if pt needs any care within 30 days. Pt is requesting a refill on all medication. He's next appointment is not until October with is new PCP. Pt would like to be notified when medications are refilled.

## 2014-01-04 NOTE — Telephone Encounter (Signed)
Please call the patient , advise him that per his request I will refill his medicine for the next 30 days only.  I will refill 1 more month of:  altace, lopressor HCTZ, lofibra , glucotrol metformin. If he needs insulin, he needs to contact Dr Cruzita Lederer.   I understand that he has an appointment with [facility] as his new provider in October. I will not be able to provide any additional refills before he is seen at [facility] unless he has blood work completed.   I will enter orders for blood work (CMP) at the [facility] lab (no appointment neccesary), please ask patient to get that done next week. If he gets labs done before August 31 and if appropriate I will renew his meds once more before his appointment at [facility].

## 2014-01-04 NOTE — Telephone Encounter (Signed)
Spoke with patient and advised per recommendations. States that he has insulin but understands that he will get that from Dr Raul Del. Patient states that he will go to get labs drawn at [facility]. Verbalizes understanding of plan for labs. Medications sent to pharmacy.

## 2014-01-05 ENCOUNTER — Other Ambulatory Visit: Payer: Self-pay | Admitting: Internal Medicine

## 2014-01-06 ENCOUNTER — Other Ambulatory Visit: Payer: Self-pay | Admitting: Internal Medicine

## 2014-02-01 ENCOUNTER — Other Ambulatory Visit: Payer: Self-pay | Admitting: Internal Medicine

## 2014-02-08 ENCOUNTER — Encounter: Payer: Self-pay | Admitting: Internal Medicine

## 2014-02-10 ENCOUNTER — Other Ambulatory Visit: Payer: Self-pay | Admitting: Internal Medicine

## 2014-02-11 ENCOUNTER — Other Ambulatory Visit: Payer: Self-pay | Admitting: Internal Medicine

## 2014-03-11 ENCOUNTER — Ambulatory Visit: Payer: Self-pay | Admitting: Family Medicine

## 2014-03-17 ENCOUNTER — Other Ambulatory Visit: Payer: Self-pay | Admitting: Internal Medicine

## 2014-03-18 ENCOUNTER — Other Ambulatory Visit: Payer: Self-pay | Admitting: Physician Assistant

## 2014-03-19 ENCOUNTER — Ambulatory Visit (INDEPENDENT_AMBULATORY_CARE_PROVIDER_SITE_OTHER): Payer: BC Managed Care – PPO | Admitting: Family Medicine

## 2014-03-19 ENCOUNTER — Encounter: Payer: Self-pay | Admitting: Family Medicine

## 2014-03-19 ENCOUNTER — Other Ambulatory Visit: Payer: Self-pay | Admitting: Physician Assistant

## 2014-03-19 VITALS — BP 129/84 | HR 73 | Temp 97.4°F | Resp 16 | Ht 68.25 in | Wt 218.6 lb

## 2014-03-19 DIAGNOSIS — F909 Attention-deficit hyperactivity disorder, unspecified type: Secondary | ICD-10-CM

## 2014-03-19 DIAGNOSIS — IMO0001 Reserved for inherently not codable concepts without codable children: Secondary | ICD-10-CM

## 2014-03-19 DIAGNOSIS — F411 Generalized anxiety disorder: Secondary | ICD-10-CM

## 2014-03-19 DIAGNOSIS — K2270 Barrett's esophagus without dysplasia: Secondary | ICD-10-CM

## 2014-03-19 DIAGNOSIS — E1169 Type 2 diabetes mellitus with other specified complication: Secondary | ICD-10-CM

## 2014-03-19 DIAGNOSIS — F988 Other specified behavioral and emotional disorders with onset usually occurring in childhood and adolescence: Secondary | ICD-10-CM

## 2014-03-19 DIAGNOSIS — E785 Hyperlipidemia, unspecified: Secondary | ICD-10-CM

## 2014-03-19 DIAGNOSIS — E1165 Type 2 diabetes mellitus with hyperglycemia: Secondary | ICD-10-CM

## 2014-03-19 DIAGNOSIS — I10 Essential (primary) hypertension: Secondary | ICD-10-CM

## 2014-03-19 LAB — COMPREHENSIVE METABOLIC PANEL
ALT: 24 U/L (ref 0–53)
AST: 14 U/L (ref 0–37)
Albumin: 3.7 g/dL (ref 3.5–5.2)
Alkaline Phosphatase: 75 U/L (ref 39–117)
BUN: 12 mg/dL (ref 6–23)
CO2: 28 mEq/L (ref 19–32)
CREATININE: 0.88 mg/dL (ref 0.50–1.35)
Calcium: 9.7 mg/dL (ref 8.4–10.5)
Chloride: 97 mEq/L (ref 96–112)
Glucose, Bld: 257 mg/dL — ABNORMAL HIGH (ref 70–99)
Potassium: 3.8 mEq/L (ref 3.5–5.3)
Sodium: 136 mEq/L (ref 135–145)
Total Bilirubin: 0.3 mg/dL (ref 0.2–1.2)
Total Protein: 6.8 g/dL (ref 6.0–8.3)

## 2014-03-19 LAB — LIPID PANEL
CHOL/HDL RATIO: 4.1 ratio
Cholesterol: 161 mg/dL (ref 0–200)
HDL: 39 mg/dL — ABNORMAL LOW (ref >39)
LDL CALC: 74 mg/dL (ref 0–99)
Triglycerides: 238 mg/dL — ABNORMAL HIGH (ref <150)
VLDL: 48 mg/dL — ABNORMAL HIGH (ref 0–40)

## 2014-03-19 LAB — POCT GLYCOSYLATED HEMOGLOBIN (HGB A1C): Hemoglobin A1C: 11.3

## 2014-03-19 MED ORDER — PANTOPRAZOLE SODIUM 40 MG PO TBEC: DELAYED_RELEASE_TABLET | ORAL | Status: DC

## 2014-03-19 MED ORDER — ALPRAZOLAM 1 MG PO TABS: 1.0000 mg | ORAL_TABLET | Freq: Every day | ORAL | Status: DC | PRN

## 2014-03-19 MED ORDER — AMPHETAMINE-DEXTROAMPHET ER 20 MG PO CP24: ORAL_CAPSULE | ORAL | Status: DC

## 2014-03-19 MED ORDER — RAMIPRIL 10 MG PO CAPS: ORAL_CAPSULE | ORAL | Status: DC

## 2014-03-19 MED ORDER — HYDROCHLOROTHIAZIDE 25 MG PO TABS: ORAL_TABLET | ORAL | Status: DC

## 2014-03-19 MED ORDER — GLIPIZIDE 10 MG PO TABS: 10.0000 mg | ORAL_TABLET | Freq: Two times a day (BID) | ORAL | Status: DC

## 2014-03-19 MED ORDER — METOPROLOL TARTRATE 100 MG PO TABS: ORAL_TABLET | ORAL | Status: DC

## 2014-03-19 MED ORDER — METFORMIN HCL 1000 MG PO TABS: ORAL_TABLET | ORAL | Status: DC

## 2014-03-19 MED ORDER — FENOFIBRATE MICRONIZED 134 MG PO CAPS: ORAL_CAPSULE | ORAL | Status: DC

## 2014-03-19 NOTE — Patient Instructions (Signed)
Please schedule follow up with Dr. Cruzita Lederer [phone] and Dr. Carlean Purl [phone] Decrease carbohydrates- anything sweet or starchy- bread, desserts, pasta, juice, soda, tea

## 2014-03-19 NOTE — Progress Notes (Signed)
Subjective:    Patient ID: Raymond Pugh, male    DOB: 11-22-1960, 53 y.o.   MRN: [id number]  HPI This is a pleasant 53 yo male who presents today to establish care with [facility] and to receive medication refills. He has an appointment with Dr. Carlota Raspberry 06/03/2104, but needs medication refills as he is about out. He had a period of time in the last couple of months when he was out of his medications including his diabetes meds. He has seen Dr. Larose Kells as his PCP in the past and he sees Dr. Letta Median for management of his diabetes. He admits to being noncompliant with follow up office visits and lab work. He says this is due to his summer work schedule and inability to take time off from work. Dr. Larose Kells has dismissed the patient from his practice due to noncompliance. The patient reports that he is very motivated to improve his compliance and he thinks that he will be more able to attend visits at [facility] with the expanded days and hours of care.   He has been working to loose weight by exercising and decreasing portions.   He has a long history of ADHD and takes Adderall to help with concentration at work. He never takes more than 2 doses a day. 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   Past Medical History  Diagnosis Date  . Hypertension   . Hyperlipemia   . Diabetes mellitus   . Barrett's esophagus   . Hiatal hernia   . ADHD (attention deficit hyperactivity disorder)   . Anemia   . Myocarditis     h/o mypocarditis, caused cardiomyopathy-- resolved    Past Surgical History  Procedure Laterality Date  . Hydrocele excision / repair    . Retinal detachment surgery    . Radiokeratotomy     Family History  Problem Relation Age of Onset  . Diabetes Father   . Hypertension Father   . Hypertension Mother   . Colon polyps Father   . Asthma Mother   . Prostate cancer Neg Hx    History  Substance Use Topics  . Smoking status: Never Smoker   . Smokeless tobacco: Never Used  . Alcohol Use:  0.6 oz/week    1 Cans of beer per week     Comment: socially     Review of Systems No chest pain, no SOB, no headache, no polyuria/polydipsia/polyphagia, no edema, no sores that won't heal.     Objective:   Physical Exam  Vitals reviewed. Constitutional: He is oriented to person, place, and time. He appears well-developed and well-nourished.  HENT:  Head: Normocephalic and atraumatic.  Right Ear: External ear normal.  Left Ear: External ear normal.  Nose: Nose normal.  Mouth/Throat: Oropharynx is clear and moist.  Eyes: Conjunctivae and EOM are normal. Pupils are equal, round, and reactive to light.  Neck: Normal range of motion. Neck supple.  Cardiovascular: Normal rate, regular rhythm and normal heart sounds.   Pulmonary/Chest: Effort normal and breath sounds normal.  Musculoskeletal: Normal range of motion. He exhibits no edema.  Neurological: He is alert and oriented to person, place, and time.  Skin: Skin is warm and dry.  Psychiatric: He has a normal mood and affect. His behavior is normal. Judgment and thought content normal.      Assessment & Plan:  Discussed importance of compliance with patient and that I would provide him a 3 month supply of medications to last him  until his appointment 06/03/14 with Dr. Carlota Raspberry.  1. BARRETTS ESOPHAGUS -follow up with GI - pantoprazole (PROTONIX) 40 MG tablet; TAKE 1 TABLET BY MOUTH EVERY MORNING BEFORE BREAKFAST  Dispense: 30 tablet; Refill: 2  2. Diabetes mellitus type 2, uncontrolled, without complications - POCT glycosylated hemoglobin (Hb A1C) - CBC with Differential - Comprehensive metabolic panel - Lipid panel - metFORMIN (GLUCOPHAGE) 1000 MG tablet; TAKE 1 TABLETS BY MOUTH TWICE DAILY  Dispense: 60 tablet; Refill: 2 - glipiZIDE (GLUCOTROL) 10 MG tablet; Take 1 tablet (10 mg total) by mouth 2 (two) times daily before a meal.  Dispense: 60 tablet; Refill: 2  3. Essential hypertension - CBC with Differential - Comprehensive  metabolic panel - Lipid panel - ramipril (ALTACE) 10 MG capsule; TAKE ONE CAPSULE BY MOUTH TWICE DAILY  Dispense: 60 capsule; Refill: 2 - metoprolol (LOPRESSOR) 100 MG tablet; TAKE 1 TABLET BY MOUTH TWICE DAILY  Dispense: 60 tablet; Refill: 2 - hydrochlorothiazide (HYDRODIURIL) 25 MG tablet; TAKE 1 TABLET BY MOUTH DAILY  Dispense: 30 tablet; Refill: 2  4. ADD (attention deficit disorder) - amphetamine-dextroamphetamine (ADDERALL XR) 20 MG 24 hr capsule; Take 2 Capsules by mouth every day  Dispense: 60 capsule; Refill: 0  5. Hyperlipidemia associated with type 2 diabetes mellitus - Lipid panel - fenofibrate micronized (LOFIBRA) 134 MG capsule; TAKE 1 CAPSULE BY MOUTH EVERY DAY BEFORE BREAKFAST  Dispense: 30 capsule; Refill: 2  6. Generalized anxiety disorder - ALPRAZolam (XANAX) 1 MG tablet; Take 1 tablet (1 mg total) by mouth daily as needed (for anxiety).  Dispense: 30 tablet; Refill: 0   Elby Beck, FNP-BC  [facility]  03/19/2014 4:51 PM

## 2014-03-20 LAB — CBC WITH DIFFERENTIAL/PLATELET
BASOS ABS: 0 10*3/uL (ref 0.0–0.1)
Basophils Relative: 0 % (ref 0–1)
EOS ABS: 0.6 10*3/uL (ref 0.0–0.7)
Eosinophils Relative: 5 % (ref 0–5)
HCT: 42.5 % (ref 39.0–52.0)
Hemoglobin: 14.7 g/dL (ref 13.0–17.0)
LYMPHS PCT: 37 % (ref 12–46)
Lymphs Abs: 4.2 10*3/uL — ABNORMAL HIGH (ref 0.7–4.0)
MCH: 30.3 pg (ref 26.0–34.0)
MCHC: 34.6 g/dL (ref 30.0–36.0)
MCV: 87.6 fL (ref 78.0–100.0)
Monocytes Absolute: 0.6 10*3/uL (ref 0.1–1.0)
Monocytes Relative: 5 % (ref 3–12)
Neutro Abs: 6 10*3/uL (ref 1.7–7.7)
Neutrophils Relative %: 53 % (ref 43–77)
PLATELETS: 284 10*3/uL (ref 150–400)
RBC: 4.85 MIL/uL (ref 4.22–5.81)
RDW: 13.4 % (ref 11.5–15.5)
WBC: 11.4 10*3/uL — AB (ref 4.0–10.5)

## 2014-04-16 ENCOUNTER — Other Ambulatory Visit: Payer: Self-pay | Admitting: Internal Medicine

## 2014-05-02 ENCOUNTER — Encounter (HOSPITAL_COMMUNITY): Payer: Self-pay | Admitting: Vascular Surgery

## 2014-05-02 ENCOUNTER — Emergency Department (HOSPITAL_COMMUNITY): Payer: BC Managed Care – PPO

## 2014-05-02 ENCOUNTER — Emergency Department (HOSPITAL_COMMUNITY)
Admission: EM | Admit: 2014-05-02 | Discharge: 2014-05-02 | Disposition: A | Discharge status: Home / Self Care | Payer: BC Managed Care – PPO | Attending: Emergency Medicine | Admitting: Emergency Medicine

## 2014-05-02 DIAGNOSIS — F909 Attention-deficit hyperactivity disorder, unspecified type: Secondary | ICD-10-CM | POA: Diagnosis not present

## 2014-05-02 DIAGNOSIS — Z79899 Other long term (current) drug therapy: Secondary | ICD-10-CM | POA: Diagnosis not present

## 2014-05-02 DIAGNOSIS — I959 Hypotension, unspecified: Secondary | ICD-10-CM

## 2014-05-02 DIAGNOSIS — Z8719 Personal history of other diseases of the digestive system: Secondary | ICD-10-CM | POA: Diagnosis not present

## 2014-05-02 DIAGNOSIS — B029 Zoster without complications: Secondary | ICD-10-CM | POA: Insufficient documentation

## 2014-05-02 DIAGNOSIS — Z794 Long term (current) use of insulin: Secondary | ICD-10-CM | POA: Diagnosis not present

## 2014-05-02 DIAGNOSIS — Z862 Personal history of diseases of the blood and blood-forming organs and certain disorders involving the immune mechanism: Secondary | ICD-10-CM | POA: Diagnosis not present

## 2014-05-02 DIAGNOSIS — E119 Type 2 diabetes mellitus without complications: Secondary | ICD-10-CM | POA: Diagnosis not present

## 2014-05-02 DIAGNOSIS — R21 Rash and other nonspecific skin eruption: Secondary | ICD-10-CM | POA: Diagnosis present

## 2014-05-02 DIAGNOSIS — I10 Essential (primary) hypertension: Secondary | ICD-10-CM | POA: Insufficient documentation

## 2014-05-02 LAB — BASIC METABOLIC PANEL
Anion gap: 16 — ABNORMAL HIGH (ref 5–15)
BUN: 12 mg/dL (ref 6–23)
CALCIUM: 8.7 mg/dL (ref 8.4–10.5)
CO2: 21 meq/L (ref 19–32)
CREATININE: 0.72 mg/dL (ref 0.50–1.35)
Chloride: 94 mEq/L — ABNORMAL LOW (ref 96–112)
GFR calc Af Amer: >90 mL/min (ref >90)
GFR calc non Af Amer: >90 mL/min (ref >90)
Glucose, Bld: 240 mg/dL — ABNORMAL HIGH (ref 70–99)
Potassium: 3.4 mEq/L — ABNORMAL LOW (ref 3.7–5.3)
SODIUM: 131 meq/L — AB (ref 137–147)

## 2014-05-02 LAB — URINE MICROSCOPIC-ADD ON

## 2014-05-02 LAB — I-STAT CG4 LACTIC ACID, ED
Lactic Acid, Venous: 2.52 mmol/L — ABNORMAL HIGH (ref 0.5–2.2)
Lactic Acid, Venous: 1.24 mmol/L (ref 0.5–2.2)

## 2014-05-02 LAB — CBC
HCT: 42.8 % (ref 39.0–52.0)
HEMOGLOBIN: 14.8 g/dL (ref 13.0–17.0)
MCH: 30.5 pg (ref 26.0–34.0)
MCHC: 34.6 g/dL (ref 30.0–36.0)
MCV: 88.2 fL (ref 78.0–100.0)
Platelets: 221 10*3/uL (ref 150–400)
RBC: 4.85 MIL/uL (ref 4.22–5.81)
RDW: 12.9 % (ref 11.5–15.5)
WBC: 17.4 10*3/uL — ABNORMAL HIGH (ref 4.0–10.5)

## 2014-05-02 LAB — URINALYSIS, ROUTINE W REFLEX MICROSCOPIC
Glucose, UA: >1000 mg/dL — AB
Hgb urine dipstick: NEGATIVE
KETONES UR: 15 mg/dL — AB
LEUKOCYTES UA: NEGATIVE
Nitrite: NEGATIVE
PH: 5 (ref 5.0–8.0)
Protein, ur: NEGATIVE mg/dL
Specific Gravity, Urine: 1.038 — ABNORMAL HIGH (ref 1.005–1.030)
Urobilinogen, UA: 0.2 mg/dL (ref 0.0–1.0)

## 2014-05-02 LAB — CBG MONITORING, ED
Glucose-Capillary: 224 mg/dL — ABNORMAL HIGH (ref 70–99)
Glucose-Capillary: 250 mg/dL — ABNORMAL HIGH (ref 70–99)

## 2014-05-02 MED ORDER — FENTANYL CITRATE 0.05 MG/ML IJ SOLN
100.0000 ug | Freq: Once | INTRAMUSCULAR | Status: AC
  Administered 2014-05-02: 100 ug via INTRAVENOUS
  Filled 2014-05-02: qty 2

## 2014-05-02 MED ORDER — HYDROMORPHONE HCL 1 MG/ML IJ SOLN
1.0000 mg | Freq: Once | INTRAMUSCULAR | Status: AC
  Administered 2014-05-02: 1 mg via INTRAVENOUS
  Filled 2014-05-02: qty 1

## 2014-05-02 MED ORDER — VALACYCLOVIR HCL 1 G PO TABS: 1000.0000 mg | ORAL_TABLET | Freq: Three times a day (TID) | ORAL | Status: DC

## 2014-05-02 MED ORDER — ONDANSETRON HCL 4 MG/2ML IJ SOLN
4.0000 mg | Freq: Once | INTRAMUSCULAR | Status: AC
  Administered 2014-05-02: 4 mg via INTRAVENOUS
  Filled 2014-05-02: qty 2

## 2014-05-02 MED ORDER — OXYCODONE-ACETAMINOPHEN 10-325 MG PO TABS: 0.5000 | ORAL_TABLET | ORAL | Status: DC | PRN

## 2014-05-02 MED ORDER — SODIUM CHLORIDE 0.9 % IV BOLUS (SEPSIS)
1000.0000 mL | Freq: Once | INTRAVENOUS | Status: AC
  Administered 2014-05-02: 1000 mL via INTRAVENOUS

## 2014-05-02 NOTE — Discharge Instructions (Signed)

## 2014-05-02 NOTE — ED Provider Notes (Signed)
CSN: [id number]     Arrival date & time 05/02/14  1715 History   First MD Initiated Contact with Patient 05/02/14 1845     Chief Complaint  Patient presents with  . Insect Bite     (Consider location/radiation/quality/duration/timing/severity/associated sxs/prior Treatment) HPI  This is a 53 year old male with past medical history of hypertension, hyperlipidemia, diabetes and ADHD who presents to the emergency department with chief complaint of spider bite. The patient states that 2 days ago, he was crawling under a house doing some work when he felt a sudden sharp pain in the right side of his scalp. He states he was not a lot of spiders in his son's saw a spider on him. He said that the next day he developed tingling and numbness and pain on the right side of the scalp along with muscle twitching and cramps. He had associated subjective fever, fatigue, increased thirst and urination. Today, he has severe pain on the right side of his scalp, he has generalized malaise, fatigue, myalgias. He denies urinary symptoms, productive cough, abdominal pain, nausea, vomiting, chest pain, shortness of breath.   Past Medical History  Diagnosis Date  . Hypertension   . Hyperlipemia   . Diabetes mellitus   . Barrett's esophagus   . Hiatal hernia   . ADHD (attention deficit hyperactivity disorder)   . Anemia   . Myocarditis     h/o mypocarditis, caused cardiomyopathy-- resolved    Past Surgical History  Procedure Laterality Date  . Hydrocele excision / repair    . Retinal detachment surgery    . Radiokeratotomy     Family History  Problem Relation Age of Onset  . Diabetes Father   . Hypertension Father   . Hypertension Mother   . Colon polyps Father   . Asthma Mother   . Prostate cancer Neg Hx    History  Substance Use Topics  . Smoking status: Never Smoker   . Smokeless tobacco: Never Used  . Alcohol Use: 0.6 oz/week    1 Cans of beer per week     Comment: socially     Review of  Systems  Ten systems reviewed and are negative for acute change, except as noted in the HPI.    Allergies  Review of patient's allergies indicates not on file.  Home Medications   Prior to Admission medications   Medication Sig Start Date End Date Taking? Authorizing Provider  ALPRAZolam Duanne Moron) 1 MG tablet Take 1 tablet (1 mg total) by mouth daily as needed (for anxiety). 03/19/14   Elby Beck, FNP  amphetamine-dextroamphetamine (ADDERALL XR) 20 MG 24 hr capsule Take 2 Capsules by mouth every day 03/19/14   Elby Beck, FNP  Blood Glucose Monitoring Suppl (ONE TOUCH ULTRA SYSTEM KIT) W/DEVICE KIT Use as advised 10/30/12   [name], MD  fenofibrate micronized (LOFIBRA) 134 MG capsule TAKE 1 CAPSULE BY MOUTH EVERY DAY BEFORE BREAKFAST 03/19/14   Elby Beck, FNP  glipiZIDE (GLUCOTROL) 10 MG tablet Take 1 tablet (10 mg total) by mouth 2 (two) times daily before a meal. 03/19/14   Elby Beck, FNP  glucose blood test strip Use as instructed 3 x day to check blood sugar every day. Patient uses Freestyle Freedom Test Strips. Diagnosis code 250.00 07/12/12   [name], MD  glucose blood test strip Check 3x a day - Use as instructed 10/30/12   [name], MD  hydrochlorothiazide (HYDRODIURIL) 25 MG tablet TAKE 1 TABLET BY MOUTH  DAILY 03/19/14   Elby Beck, FNP  Insulin Pen Needle (BD PEN NEEDLE NANO U/F) 32G X 4 MM MISC Use to inject insulin at bedtime as instructed. 11/16/13   [name], MD  Lancets [facility] ULTRASOFT) lancets Check 3x a day - Use as instructed 10/30/12   [name], MD  LANTUS SOLOSTAR 100 UNIT/ML Solostar Pen INJECT 20 UNITS UNDER THE SKIN EVERY NIGHT AT BEDTIME 08/14/13   [name], MD  metFORMIN (GLUCOPHAGE) 1000 MG tablet TAKE 1 TABLETS BY MOUTH TWICE DAILY 03/19/14   Elby Beck, FNP  metoprolol (LOPRESSOR) 100 MG tablet TAKE 1 TABLET BY MOUTH TWICE DAILY 03/19/14   Elby Beck, FNP  pantoprazole  (PROTONIX) 40 MG tablet TAKE 1 TABLET BY MOUTH EVERY MORNING BEFORE BREAKFAST 03/19/14   Elby Beck, FNP  ramipril (ALTACE) 10 MG capsule TAKE ONE CAPSULE BY MOUTH TWICE DAILY 03/19/14   Elby Beck, FNP   BP 95/68 mmHg  Pulse 98  Temp(Src) 98.1 F (36.7 C) (Oral)  Resp 12  Ht '5\' 11"'  (1.803 m)  Wt 213 lb (96.616 kg)  BMI 29.72 kg/m2  SpO2 96% Physical Exam  Constitutional: He appears well-developed and well-nourished. No distress.  HENT:  Head: Normocephalic and atraumatic.    Multiple tender, crusted lesions about ranging from 1-2 cm in diameter with overlying crusts in the C2/C3 dermatome   Eyes: Conjunctivae are normal. No scleral icterus.  Neck: Normal range of motion. Neck supple.  Cardiovascular: Normal rate, regular rhythm and normal heart sounds.   Pulmonary/Chest: Effort normal and breath sounds normal. No respiratory distress.  Abdominal: Soft. There is no tenderness.  Musculoskeletal: He exhibits no edema.  Neurological: He is alert.  Skin: Skin is warm and dry. He is not diaphoretic.  Psychiatric: His behavior is normal.  Nursing note and vitals reviewed.   ED Course  Procedures (including critical care time) Labs Review Labs Reviewed  CBC - Abnormal; Notable for the following:    WBC 17.4 (*)    All other components within normal limits  BASIC METABOLIC PANEL - Abnormal; Notable for the following:    Sodium 131 (*)    Potassium 3.4 (*)    Chloride 94 (*)    Glucose, Bld 240 (*)    Anion gap 16 (*)    All other components within normal limits  CBG MONITORING, ED - Abnormal; Notable for the following:    Glucose-Capillary 224 (*)    All other components within normal limits  URINALYSIS, ROUTINE W REFLEX MICROSCOPIC  I-STAT CG4 LACTIC ACID, ED    Imaging Review No results found.   EKG Interpretation None      MDM   Final diagnoses:  Hypotension    7:48 PM BP 95/68 mmHg  Pulse 98  Temp(Src) 98.1 F (36.7 C) (Oral)  Resp 12   Ht '5\' 11"'  (1.803 m)  Wt 213 lb (96.616 kg)  BMI 29.72 kg/m2  SpO2 96%  Patient see in shared visit with Dr. Tawnya Crook. He has generalized sxs of myalgias, malaise with Pain in the scalp. Appears consistent with shingles outbreak. Patient also has hypotension with soft pressure 95/68 and elevated blood glucose. We will get a lactate, Ua and cxr. Pain medication given. No signs of cellulitis.   Patient with elevated lactate. Leukocytosis and blood glucose. Soft blood pressure. He does not meet sepsis criteria. Patient given pain meds and bolus. Will reevaluate.  Filed Vitals:   05/02/14 2200 05/02/14 2215 05/02/14 2230 05/02/14  2245  BP: 106/55 115/73 118/65 109/70  Pulse: 87 87 85 87  Temp:      TempSrc:      Resp: '19 16 15 16  ' Height:      Weight:      SpO2: 92% 97% 99% 97%    Patient pain is resolved. His blood pressure improved. Glucose is still elevated, however his lactate is wnl and his blood pressure is improved. Will treat as shingles outbreak with valtrex and pain meds. Follow closely with pcp for pain control and wound check.  Patient seen in shared visit with attending physician. Discussed the case with Dr. Tawnya Crook who agrees with POC.   05/06/14 0855  Margarita Mail, PA-C 05/06/14 2820  Ernestina Patches, MD 05/07/14 1210

## 2014-05-02 NOTE — ED Notes (Signed)
Abigail, PA-C, present to update family and patient on status and plan of care.

## 2014-05-02 NOTE — ED Notes (Signed)
Pt reports to the for eval of possible spider bite. 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 He reports he felt a sharp pain to the posterior skull and he swatted away whatever bit him. Pt reports some pain and swelling to his posterior head. Small area of erythema noted to the skull. He reports high fevers, generalized body aches, spasms, and cramps, as well as nausea. Denies any V/D. He reports Ibuprofen only slightly helps the pain. Pt is a diabetic and reports he has had increased thirst today. Pt A&Ox4, resp e/u, and skin warm and dry.

## 2014-05-02 NOTE — ED Notes (Signed)
Abigail, PA-C, is aware of patient's blood pressure in the 90's.

## 2014-05-02 NOTE — ED Notes (Signed)
Phlebotomy at the bedside  

## 2014-05-08 ENCOUNTER — Ambulatory Visit (INDEPENDENT_AMBULATORY_CARE_PROVIDER_SITE_OTHER): Payer: BC Managed Care – PPO | Admitting: Family Medicine

## 2014-05-08 VITALS — BP 110/80 | HR 88 | Temp 98.1°F | Resp 20 | Ht 71.0 in | Wt 210.0 lb

## 2014-05-08 DIAGNOSIS — T63301A Toxic effect of unspecified spider venom, accidental (unintentional), initial encounter: Secondary | ICD-10-CM

## 2014-05-08 DIAGNOSIS — S60512A Abrasion of left hand, initial encounter: Secondary | ICD-10-CM

## 2014-05-08 MED ORDER — DOXYCYCLINE HYCLATE 100 MG PO TABS: 100.0000 mg | ORAL_TABLET | Freq: Two times a day (BID) | ORAL | Status: DC

## 2014-05-08 NOTE — Progress Notes (Signed)
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  He developed muscle cramps.  He was diagnosed with shingles and started on valtrex, and rash cleared in one day.  He continues to have night sweats.  The abrasion on left hand web space continues.  Objective:  NAD Scalp: normal Left hand 1 cm dry abrasion  Assessment:  Probable black widow spider bites.  Plan: Spider bite, accidental or unintentional, initial encounter - Plan: doxycycline (VIBRA-TABS) 100 MG tablet  Robyn Haber, MD

## 2014-05-08 NOTE — Patient Instructions (Signed)
Black Widow Spider Bite The adult male black widow spider has a black body about  inch (1.2 cm) long with an orange-red hourglass shape on her belly. Black widow spider bites can be serious and life-threatening. SYMPTOMS  Symptoms usually develop in 15 minutes to 2 hours after the bite. Symptoms usually peak in 3 hours to 1 day and may include:  Muscle cramps. These may occur anywhere in the body, including the abdomen.  Nausea and vomiting.  Dizziness.  Heavy sweating.  Shaking.  Restlessness.  Fever.  Trouble breathing.  Chest pain.  Swelling or a rash around the bite. TREATMENT  Serious reactions may require hospitalization. Your caregiver may give you an antivenom injection to help reduce pain. This is only done for very severe reactions that are not effectively treated with other medicines. There is a chance of having an allergic reaction to the antivenom since it is derived from horse serum. HOME CARE INSTRUCTIONS   Do not scratch the bite area. Keep the area clean and covered with an adhesive bandage or sterile gauze bandage.  Wash the area 3 times per day in warm, soapy water or as directed by your caregiver.  Put ice or cool compresses on the bite area.  Put ice in a plastic bag.  Place a towel between your skin and the bag.  Leave the ice on for 20 minutes, 4 times a day for the first 2 days or as directed.  Keep the bite area elevated above the level of your heart. This helps reduce redness and swelling.  Only take over-the-counter or prescription medicines for pain, discomfort, or fever as directed by your caregiver.  Watch the bite area closely for worsening pain, swelling, redness, or pus. You may need a tetanus shot if:  You cannot remember when you had your last tetanus shot.  You have never had a tetanus shot.  The injury broke your skin. If you get a tetanus shot, your arm may swell, get red, and feel warm to the touch. This is common and not a  problem. If you need a tetanus shot and you choose not to have one, there is a rare chance of getting tetanus. Sickness from tetanus can be serious. SEEK IMMEDIATE MEDICAL CARE IF:   You have muscle cramps or abdominal pain or cramps.  You develop rapid breathing or a rapid heartbeat.  You become extremely restless or confused.  You have chest pain.  You feel faint, lightheaded, or you feel generally ill (malaise).  You develop pain, soreness, redness, swelling, or drainage from the bite.  You have a fever. MAKE SURE YOU:   Understand these instructions.  Will watch your condition.  Will get help right away if you are not doing well or get worse. Document Released: 05/10/2005 Document Revised: 08/02/2011 Document Reviewed: 12/09/2010 ExitCare Patient Information 2015 [employer]. This information is not intended to replace advice given to you by your health care provider. Make sure you discuss any questions you have with your health care provider.  

## 2014-05-29 ENCOUNTER — Other Ambulatory Visit (HOSPITAL_COMMUNITY): Payer: Self-pay | Admitting: Interventional Radiology

## 2014-05-29 DIAGNOSIS — K750 Abscess of liver: Secondary | ICD-10-CM

## 2014-06-03 ENCOUNTER — Telehealth: Payer: Self-pay | Admitting: Family Medicine

## 2014-06-03 ENCOUNTER — Ambulatory Visit (INDEPENDENT_AMBULATORY_CARE_PROVIDER_SITE_OTHER): Payer: BLUE CROSS/BLUE SHIELD | Admitting: Family Medicine

## 2014-06-03 ENCOUNTER — Telehealth: Payer: Self-pay | Admitting: *Deleted

## 2014-06-03 ENCOUNTER — Encounter: Payer: Self-pay | Admitting: Family Medicine

## 2014-06-03 VITALS — BP 108/62 | HR 91 | Temp 97.4°F | Resp 16 | Ht 68.0 in | Wt 196.0 lb

## 2014-06-03 DIAGNOSIS — I10 Essential (primary) hypertension: Secondary | ICD-10-CM

## 2014-06-03 DIAGNOSIS — E1165 Type 2 diabetes mellitus with hyperglycemia: Secondary | ICD-10-CM

## 2014-06-03 DIAGNOSIS — IMO0002 Reserved for concepts with insufficient information to code with codable children: Secondary | ICD-10-CM

## 2014-06-03 DIAGNOSIS — K750 Abscess of liver: Secondary | ICD-10-CM

## 2014-06-03 NOTE — Telephone Encounter (Signed)
Faxed signed medical authorization release for records to Dr Rosaria Ferries ([facility]). Requested recent office progress notes and labwork.

## 2014-06-03 NOTE — Telephone Encounter (Signed)
Faxed transmittal (fax) form with patient's information to medical records Constance Holster) at [facility] requesting discharge summary and labs. Confirmation page received at 5:01 pm.

## 2014-06-03 NOTE — Progress Notes (Signed)
MRN: [id number] DOB: [birth year]  Subjective:   Raymond Pugh is a 54 y.o. male presenting for follow up regarding 2 week hospitalization, for an infected abscess of the liver.  He was discharged from [facility] 1 wk ago.  He was found to have an abscess via CT.  Cultures showed positive klebsiella.  He is placed on ciprofloxacin for 1 month and JP drain placed.  Drained 3L of pus from abscess.  Patient reports about 3-36m per day at this point.  He denies fever, palpitations, disorientation, n/v, abdominal pain, streaking, diaphoresis, continued night sweats, or malaise.  Patient is being followed by infectious disease.  Last appointment was yesterday.  He receives in-home patient care for tubing.  They will come in tomorrow. Patient is compliant on his diabetic medication, however is not checking blood glucose at home.  Patient states that he lost his kit, and is awaiting his strips for his new replacement kt.  He reports no nausea/vomiting, dizziness, vision changes, tremulousness, diarrhea, or polyuria.    Prior to hospitalization, ~04/28/2014     Per patient report, 03/29/2014, he was working under a home and placed his head around a nest of what he thought were black widow spiders.  He states he was bit repeatedly.  Later that night, patient reports extremity numbness and tingling of his upper extremities.  He went to the ED and dxd shingles.  He was given Valtrex, for a presumed shingles.  Within 10 days, 05/08/2014 patient felt worse with nausea, fever, and fatigue.  [facility]  Approximately 5 days later, after continuous decline with fever, loc, disorientation[facility].  He was given  IV zosyn in-hospital and a host of other antibiotics until culture result.     CNassirhas a current medication list which includes the following prescription(s): alprazolam,  amphetamine-dextroamphetamine, ciprofloxacin, fenofibrate micronized, glipizide, hydrochlorothiazide, lantus solostar, metformin, metoprolol, pantoprazole, ramipril, oxycodone-acetaminophen, and valacyclovir.  He has No Known Allergies.  [name] has a past medical history of Hypertension; Hyperlipemia; Diabetes mellitus; Barrett's esophagus; Hiatal hernia; ADHD (attention deficit hyperactivity disorder); Anemia; and Myocarditis. Also  has past surgical history that includes Hydrocele excision / repair; Retinal detachment surgery; radiokeratotomy; and Eye surgery.  ROS As in subjective.  Objective:   Vitals: BP 108/62 mmHg  Pulse 91  Temp(Src) 97.4 F (36.3 C) (Oral)  Resp 16  Ht _0  (1.727 m)  Wt 196 lb (88.905 kg)  BMI 29.81 kg/m2  SpO2 97%  Physical Exam  Constitutional: He is oriented to person, place, and time and well-developed, well-nourished, and in no distress. No distress.  HENT:  Head: Normocephalic and atraumatic.  Eyes: Conjunctivae are normal. Pupils are equal, round, and reactive to light.  Neck: Normal range of motion. Neck supple.  Cardiovascular: Normal rate, regular rhythm and normal heart sounds.  Exam reveals no gallop and no friction rub.   No murmur heard. Pulmonary/Chest: Effort normal and breath sounds normal. No respiratory distress. He has no wheezes.  Abdominal: Normal appearance.  JP drain intact with non-sanguinous drainage.  About 2cc present in suction.  No tenderness. Or erythema around adhesive bandaging.    Lymphadenopathy:    He has no cervical adenopathy.  Neurological: He is alert and oriented to person, place, and time.  Skin: Skin is warm and dry. No rash noted. No erythema. No pallor.  Psychiatric: Mood,  memory, affect and judgment normal.   Wt Readings from Last 3 Encounters:  06/03/14 196 lb (88.905 kg)  05/08/14 210 lb (95.255 kg)  05/02/14 213 lb (96.616 kg)      Assessment and Plan :  54 year old male with PMH of  uncontrolled diabetes, HTN, and hyperlipidemia is here for a followup regarding a 2 week hospitalization for infected abscess of the liver.  Patient is establishing care with this practice following dismissal from prior primary care due to non-compliance.  Patient appears to be adamantly driven to manage his care and comply with medical recommendations.  He is obtaining a complete glucose kit, and will manage his blood glucose at home, monitoring his blood glucose and documenting for followup visit.  He declines a glucose kit, lancets, and strips ordered in office.   He appears well, VS wnl, and followed by ID for this abdominal infection. -colonoscopy, EKG obtained with hospitalization and will obtain those notes.       Liver abscess -He is followed by I and D.  Will obtain the records of patient follow up care. -He is continuing the ciprofloxacin as planned.  Uncontrolled diabetes mellitus -Follow up within 2 weeks, with blood glucose numbers -Recheck his a1c at next visit -Recheck cbc, microalbuminuria at this next visit.    Essential hypertension Check CMET at next visit.

## 2014-06-03 NOTE — Patient Instructions (Signed)
Please follow up in 2 weeks with blood sugar readings, but also contact your endocrinologist for follow up.  If anything worsens prior to that visit, return here or go to the emergency room.

## 2014-06-03 NOTE — Telephone Encounter (Signed)
I checked in this patient at 104 on 06/03/2014. His wife paid the copayment of $20.00. I swiped her card in [employer] and put the payment in the cash drawer in EP[employer]. I printed the receipt from [employer]. When I went back into i[employer] a message came up that said "finsh tender" or something to that effect. I assume the swipe did not go through or it was not completed all the way. By this time the patient had been called back to his room and his wife had left the waiting area. I called the patient's wife and left a VM to explain what happened and asked if I could get the card numbers to manually enter the payment. I have not heard a response yet. I just wanted to let you all know what happened.   Thanks, [employer]

## 2014-06-04 ENCOUNTER — Telehealth: Payer: Self-pay | Admitting: *Deleted

## 2014-06-04 ENCOUNTER — Telehealth (HOSPITAL_COMMUNITY): Payer: Self-pay | Admitting: Interventional Radiology

## 2014-06-04 ENCOUNTER — Ambulatory Visit (HOSPITAL_COMMUNITY): Payer: BLUE CROSS/BLUE SHIELD

## 2014-06-04 NOTE — Telephone Encounter (Signed)
Called pt to see if he wanted to reschedule his liver abscess drain CT scan that was scheduled for today 06/04/14. Pt states that his drain and abscess with now be managed by Dr. Galen Manila and he will not be coming back here. JM

## 2014-06-04 NOTE — Telephone Encounter (Signed)
Noted in account

## 2014-06-04 NOTE — Progress Notes (Signed)
Called patient to give walk-in hours as Dr Carlota Raspberry did not have any appointment times available in two weeks.

## 2014-06-04 NOTE — Telephone Encounter (Signed)
Faxed signed request to Medical Records, I did not receive a confirmation page yesterday. Today I re faxed the request again, confirmation page received at 4:38 pm.

## 2014-06-05 NOTE — Progress Notes (Signed)
Patient discussed and examined with Ms. English.  Agree with assessment and plan of care per her note.  Portions of hospital record received. Appears stable form infection standpoint and tolerating the Levaquin.  Noted that in hospital - few nodules on chest CT that were though to be due to Septic emboli and will need follow up CT scanning.  Will recheck in next few weeks primarily to determine diabetic control as this would be most acute issue in healing and health for now.will review other medical problems and meds at subsequent visits. RTC sooner if any new or worsening sx's.

## 2014-06-06 ENCOUNTER — Encounter: Payer: Self-pay | Admitting: Internal Medicine

## 2014-06-06 ENCOUNTER — Ambulatory Visit (INDEPENDENT_AMBULATORY_CARE_PROVIDER_SITE_OTHER): Payer: BLUE CROSS/BLUE SHIELD | Admitting: Internal Medicine

## 2014-06-06 VITALS — BP 114/68 | HR 88 | Temp 97.8°F | Resp 12 | Wt 197.8 lb

## 2014-06-06 DIAGNOSIS — IMO0002 Reserved for concepts with insufficient information to code with codable children: Secondary | ICD-10-CM

## 2014-06-06 DIAGNOSIS — E1165 Type 2 diabetes mellitus with hyperglycemia: Principal | ICD-10-CM

## 2014-06-06 DIAGNOSIS — E1151 Type 2 diabetes mellitus with diabetic peripheral angiopathy without gangrene: Secondary | ICD-10-CM

## 2014-06-06 MED ORDER — INSULIN LISPRO 100 UNIT/ML (KWIKPEN): PEN_INJECTOR | SUBCUTANEOUS | Status: DC

## 2014-06-06 MED ORDER — INSULIN LISPRO 200 UNIT/ML ~~LOC~~ SOPN: 6.0000 [IU] | PEN_INJECTOR | Freq: Three times a day (TID) | SUBCUTANEOUS | Status: DC

## 2014-06-06 MED ORDER — BD PEN NEEDLE NANO U/F 32G X 4 MM MISC: Status: DC

## 2014-06-06 MED ORDER — INSULIN GLARGINE 100 UNIT/ML SOLOSTAR PEN: PEN_INJECTOR | SUBCUTANEOUS | Status: DC

## 2014-06-06 NOTE — Patient Instructions (Addendum)
Please stop Glipizide. Continue Metformin 1000 mg 2x a day with meals. Continue Lantus 20 units. Start HUMALOG mealtime insulin - inject this 15 min before every meal: - 6 units before a smaller meal - 10 units before a larger meal  Please let me know in 2 weeks if sugars >200 or <80 consistently.  Please return in 1 month with your sugar log.

## 2014-06-06 NOTE — Progress Notes (Signed)
Subjective:     Patient ID: Raymond Pugh, male   DOB: 11-25-1960, 54 y.o.   MRN: [id number]  HPI Mr. Haak is a pleasant 54 y.o. man,returning for f/u for management of DM2, dx 2008, uncontrolled, insulin-dependent, with complications (peripheral neuropathy). Last visit 1.5 years ago!  PCP: Dr [name]  He has been bitten by a spider >> has to be hospitalized in 04/2014. On ABx.  Last A hemoglobin A1c: Lab Results  Component Value Date   HGBA1C 11.3 03/19/2014   HGBA1C 10.9* 10/30/2012   HGBA1C 13.3* 06/06/2012   He is now on: - Metformin 1000 mg bid - Glipizide 10 mg bid - Lantus 20 units in at bedtime No lows. He has hypoglycemia awareness at <90.  He only checks his sugars in last 3 days - a.m.:190-250 >> 94-148 - before lunch: 120 - before dinner: 217 - after dinner: 233-275 Glu 700s when he went to the hospital per his report, but I could not find this in the records.  He quit drinking sodas.  - No kidney dysfunction.  Last BUN/Cr: Lab Results  Component Value Date   BUN 12 05/02/2014   Lab Results  Component Value Date   CREATININE 0.72 05/02/2014  He is on Ramipril.   - Last eye exam: 09/2012, Dr. Bing Plume. He has had retinal detachment surgery. He has a new appt soon.   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  Review of Systems Constitutional: no weight gain, no fatigue, no subjective hyperthermia/hypothermia, increased thirst Eyes: no blurry vision, no xerophthalmia, anisocoria - surgical pupil on the left ENT: no sore throat, no nodules palpated in throat, no dysphagia/odynophagia, no hoarseness Cardiovascular: no CP/SOB/palpitations/leg swelling Respiratory: no cough/SOB Gastrointestinal: no N/V/D/C Musculoskeletal: no muscle/joint aches Skin: no rashes Neurological: no tremors/numbness/tingling/dizziness  I reviewed pt's medications, allergies, PMH, social hx, family hx, and changes were documented in the history of present  illness. Otherwise, unchanged from my initial visit note.  Objective:   Physical Exam BP 114/68 mmHg  Pulse 88  Temp(Src) 97.8 F (36.6 C) (Oral)  Resp 12  Wt 197 lb 12.8 oz (89.721 kg)  SpO2 96% Wt Readings from Last 3 Encounters:  06/06/14 197 lb 12.8 oz (89.721 kg)  06/03/14 196 lb (88.905 kg)  05/08/14 210 lb (95.255 kg)  Constitutional: overweight, in NAD Eyes: PERRLA, EOMI, no exophthalmos ENT: moist mucous membranes, no thyromegaly, no cervical lymphadenopathy Cardiovascular: RRR, No MRG Respiratory: CTA B Gastrointestinal: abdomen soft, NT, ND, BS+ Musculoskeletal: no deformities, strength intact in all 4 Skin: moist, warm, no rashes Neurological: no tremor with outstretched hands, DTR normal in all 4   Assessment:     1.  DM2, uncontrolled, insulin-dependent, with complications - peripheral neuropathy  Plan:     1. Pt with long-standing, uncontrolled, diabetes, returning after an absence of 1.5 years. I reviewed last hbA1c with him and his wife: very high. He only brings a sugar log for last 3 days>> sugars close to target in am and getting higher throughout the day. - I discussed with the patient to add mealtime insulin >> demonstrated use of the pens and discuss difference b/w this insulin and Lantus. Will stop Glipizide.  Patient Instructions  Please stop Glipizide. Continue Metformin 1000 mg 2x a day with meals. Continue Lantus 20 units. Start HUMALOG mealtime insulin - inject this 15 min before every meal: - 6 units before a smaller meal - 10 units before a larger meal  Please let me  know in 2 weeks if sugars >200 or <80 consistently.  Please return in 1 month with your sugar log.  - we discussed about the possible use of a VGo. He has [employer], so I think this will be covered. Given brochure to review. - Given more sugar logs - I advised him to send me his sugars through my chart in 2 weeks - Will check Hba1C at next visit - RTC in 1 month with his sugar  log

## 2014-06-07 ENCOUNTER — Telehealth: Payer: Self-pay | Admitting: Internal Medicine

## 2014-06-07 MED ORDER — GLUCOSE BLOOD VI STRP: ORAL_STRIP | Status: DC

## 2014-06-07 NOTE — Telephone Encounter (Signed)
Patients wife called and would like One touch ultra test strips called in please    Pharmacy: [facility]

## 2014-06-07 NOTE — Telephone Encounter (Signed)
Done

## 2014-06-11 ENCOUNTER — Telehealth: Payer: Self-pay | Admitting: Family Medicine

## 2014-06-11 NOTE — Telephone Encounter (Signed)
Call from [facility], nurse with [facility]  BP 105/60.  Taking metoprolol 100mg  BID, and ramipril 10mg  qd. Asymptomatic.  Has machine at home and next nurse visit in 3 days.  -decrease metoprolol to 1/2 tablet (50mg ) BID, continue ramipril same dose, and check home BP's.  If over 160/100 - call in for instructions. Nurse to check BP in 3 days. He should be following up with me in next week.

## 2014-06-24 ENCOUNTER — Other Ambulatory Visit: Payer: Self-pay | Admitting: Family Medicine

## 2014-06-24 DIAGNOSIS — K750 Abscess of liver: Secondary | ICD-10-CM | POA: Insufficient documentation

## 2014-07-12 ENCOUNTER — Encounter: Payer: Self-pay | Admitting: Internal Medicine

## 2014-07-12 ENCOUNTER — Ambulatory Visit: Payer: BLUE CROSS/BLUE SHIELD | Admitting: Internal Medicine

## 2014-07-23 ENCOUNTER — Other Ambulatory Visit: Payer: Self-pay | Admitting: Family Medicine

## 2014-10-03 ENCOUNTER — Other Ambulatory Visit: Payer: Self-pay | Admitting: Family Medicine

## 2014-10-05 ENCOUNTER — Other Ambulatory Visit: Payer: Self-pay

## 2014-10-05 MED ORDER — METFORMIN HCL 1000 MG PO TABS: 1000.0000 mg | ORAL_TABLET | Freq: Two times a day (BID) | ORAL | Status: DC

## 2014-11-15 ENCOUNTER — Telehealth: Payer: Self-pay

## 2014-11-15 MED ORDER — FENOFIBRATE MICRONIZED 134 MG PO CAPS: ORAL_CAPSULE | ORAL | Status: DC

## 2014-11-15 MED ORDER — HYDROCHLOROTHIAZIDE 25 MG PO TABS: 25.0000 mg | ORAL_TABLET | Freq: Every day | ORAL | Status: DC

## 2014-11-15 NOTE — Telephone Encounter (Signed)
Patient is leaving for the beach in 30 minutes.  His 39 ft RV is loaded and ready to go and he has got to be there today.   His pharmacy has sent two requests for refills and he has to have these at least enough thru Wednesday.    [id number] - [place], [address] AT [facility] OF [place]   [phone]

## 2014-11-15 NOTE — Telephone Encounter (Signed)
Tried to call pt to verify what meds he needs because we have not gotten any reqs from pharm. VM was full and couldn't LM. Called pharm and they clarified RFs needed, and I had him change fax # in system and resend to test to see req will come through. Sent in RFs for 2 weeks, pt is overdue for f/up.

## 2014-11-18 ENCOUNTER — Other Ambulatory Visit: Payer: Self-pay

## 2014-11-20 LAB — HM DIABETES EYE EXAM

## 2014-11-21 ENCOUNTER — Ambulatory Visit (INDEPENDENT_AMBULATORY_CARE_PROVIDER_SITE_OTHER): Payer: BLUE CROSS/BLUE SHIELD | Admitting: Family Medicine

## 2014-11-21 VITALS — BP 132/78 | HR 76 | Temp 98.3°F | Resp 17 | Ht 68.0 in | Wt 216.8 lb

## 2014-11-21 DIAGNOSIS — F988 Other specified behavioral and emotional disorders with onset usually occurring in childhood and adolescence: Secondary | ICD-10-CM

## 2014-11-21 DIAGNOSIS — I10 Essential (primary) hypertension: Secondary | ICD-10-CM | POA: Diagnosis not present

## 2014-11-21 DIAGNOSIS — E785 Hyperlipidemia, unspecified: Secondary | ICD-10-CM | POA: Diagnosis not present

## 2014-11-21 DIAGNOSIS — F909 Attention-deficit hyperactivity disorder, unspecified type: Secondary | ICD-10-CM

## 2014-11-21 DIAGNOSIS — F411 Generalized anxiety disorder: Secondary | ICD-10-CM | POA: Diagnosis not present

## 2014-11-21 DIAGNOSIS — E1149 Type 2 diabetes mellitus with other diabetic neurological complication: Secondary | ICD-10-CM

## 2014-11-21 DIAGNOSIS — E1140 Type 2 diabetes mellitus with diabetic neuropathy, unspecified: Secondary | ICD-10-CM | POA: Diagnosis not present

## 2014-11-21 LAB — GLUCOSE, POCT (MANUAL RESULT ENTRY): POC Glucose: 225 mg/dl — AB (ref 70–99)

## 2014-11-21 MED ORDER — AMPHETAMINE-DEXTROAMPHET ER 20 MG PO CP24: ORAL_CAPSULE | ORAL | Status: DC

## 2014-11-21 MED ORDER — METOPROLOL TARTRATE 100 MG PO TABS: 100.0000 mg | ORAL_TABLET | Freq: Two times a day (BID) | ORAL | Status: DC

## 2014-11-21 MED ORDER — GLIPIZIDE 10 MG PO TABS: ORAL_TABLET | ORAL | Status: DC

## 2014-11-21 MED ORDER — ALPRAZOLAM 1 MG PO TABS: 0.5000 mg | ORAL_TABLET | Freq: Every day | ORAL | Status: DC | PRN

## 2014-11-21 MED ORDER — HYDROCHLOROTHIAZIDE 25 MG PO TABS: 25.0000 mg | ORAL_TABLET | Freq: Every day | ORAL | Status: DC

## 2014-11-21 MED ORDER — LOVASTATIN 20 MG PO TABS: 20.0000 mg | ORAL_TABLET | Freq: Every day | ORAL | Status: DC

## 2014-11-21 MED ORDER — RAMIPRIL 10 MG PO CAPS: 10.0000 mg | ORAL_CAPSULE | Freq: Two times a day (BID) | ORAL | Status: DC

## 2014-11-21 MED ORDER — METFORMIN HCL 1000 MG PO TABS: 1000.0000 mg | ORAL_TABLET | Freq: Two times a day (BID) | ORAL | Status: DC

## 2014-11-21 NOTE — Patient Instructions (Addendum)
You should receive a call or letter about your lab results within the next week to 10 days.  Return for cholesterol test (fasting for 8 hours) in next week.  We can try low dose statin medication instead of fenofibrate as this may be more beneficial with you having diabetes. Return in 6 weeks to recheck liver tests and cholesterol.  If any new muscle aches, or other new side effects of this medicine - return right away to determine if these are due to the cholesterol medicine.  Call Dr. Cruzita Lederer fo follow up in next month as you are overdue for this.  Follow up with me in 6 weeks to recheck liver tests and follow up on some of the medicines we discussed today.  Keep a record of your blood pressures and blood sugars outside of the office and bring them to the next office visit and your visit to Dr. Cruzita Lederer.  Return to the clinic or go to the nearest emergency room if any of your symptoms worsen or new symptoms occur.   Diabetes and Standards of Medical Care Diabetes is complicated. You may find that your diabetes team includes a dietitian, nurse, diabetes educator, eye doctor, and more. To help everyone know what is going on and to help you get the care you deserve, the following schedule of care was developed to help keep you on track. Below are the tests, exams, vaccines, medicines, education, and plans you will need. HbA1c test This test shows how well you have controlled your glucose over the past 2-3 months. It is used to see if your diabetes management plan needs to be adjusted.   It is performed at least 2 times a year if you are meeting treatment goals.  It is performed 4 times a year if therapy has changed or if you are not meeting treatment goals. Blood pressure test  This test is performed at every routine medical visit. The goal is less than 140/90 mm Hg for most people, but 130/80 mm Hg in some cases. Ask your health care provider about your goal. Dental exam  Follow up with the  dentist regularly. Eye exam  If you are diagnosed with type 1 diabetes as a child, get an exam upon reaching the age of 32 years or older and have had diabetes for 3-5 years. Yearly eye exams are recommended after that initial eye exam.  If you are diagnosed with type 1 diabetes as an adult, get an exam within 5 years of diagnosis and then yearly.  If you are diagnosed with type 2 diabetes, get an exam as soon as possible after the diagnosis and then yearly. Foot care exam  Visual foot exams are performed at every routine medical visit. The exams check for cuts, injuries, or other problems with the feet.  A comprehensive foot exam should be done yearly. This includes visual inspection as well as assessing foot pulses and testing for loss of sensation.  Check your feet nightly for cuts, injuries, or other problems with your feet. Tell your health care provider if anything is not healing. Kidney function test (urine microalbumin)  This test is performed once a year.  Type 1 diabetes: The first test is performed 5 years after diagnosis.  Type 2 diabetes: The first test is performed at the time of diagnosis.  A serum creatinine and estimated glomerular filtration rate (eGFR) test is done once a year to assess the level of chronic kidney disease (CKD), if present. Lipid profile (cholesterol,  HDL, LDL, triglycerides)  Performed every 5 years for most people.  The goal for LDL is less than 100 mg/dL. If you are at high risk, the goal is less than 70 mg/dL.  The goal for HDL is 40 mg/dL-50 mg/dL for men and 50 mg/dL-60 mg/dL for women. An HDL cholesterol of 60 mg/dL or higher gives some protection against heart disease.  The goal for triglycerides is less than 150 mg/dL. Influenza vaccine, pneumococcal vaccine, and hepatitis B vaccine  The influenza vaccine is recommended yearly.  It is recommended that people with diabetes who are over 25 years old get the pneumonia vaccine. In some  cases, two separate shots may be given. Ask your health care provider if your pneumonia vaccination is up to date.  The hepatitis B vaccine is also recommended for adults with diabetes. Diabetes self-management education  Education is recommended at diagnosis and ongoing as needed. Treatment plan  Your treatment plan is reviewed at every medical visit. Document Released: 03/07/2009 Document Revised: 09/24/2013 Document Reviewed: 10/10/2012 [facility] Patient Information 2015 [place], [place]. This information is not intended to replace advice given to you by your health care provider. Make sure you discuss any questions you have with your health care provider.

## 2014-11-21 NOTE — Progress Notes (Addendum)
Subjective:  This chart was scribed for Raymond Ray, MD by Raymond Pugh, medical scribe at [facility].The patient was seen in exam room 02 and the patient's care was started at 6:00 PM.   Patient ID: Raymond Pugh, male    DOB: 07/27/1960, 54 y.o.   MRN: [id number] Chief Complaint  Patient presents with  . Medication Refill    5 dif. ones all circled in chart    HPI HPI Comments: Raymond Pugh is a 54 y.o. male who presents to [facility] here for multiple medication refills. Last visit with me was with Raymond Pugh in January of this year after following up for a liver abscess. See details in that note. Planned to recheck in a few weeks at the January visit.  Diabetes (insulin dependent): Seen by Raymond Pugh since last visit, last seen there on January 14 th. Had not been seen at that office in one and a half years. She stopped glipizide. He was continued metformin 1000 mg bid, Lantuss qd, she added Humalog with meals (6 units with small meal, 10 units with large meal). Planned for recheck with endocrinologist at one month. He canceled his appointment on Feb 19 th. No follow up since that time. He has not had any lab work recently. Planned A1c with Raymond Pugh at follow up visit. He is requesting a refill for Glipizide today, however as above he was instructed to stop. He was taking Cipro but stopped taking and now asked to start taking Glipizide. No symptomatic lows but patient states that he has had a reading of 80 after taking Glipizide and Insulin. [facility]  Does admit to not taking diabetes every day as prescribed last week. Also ate more than usual past week on vacation,   Drank energy drink prior to coming into our office tonight.   Lab Results  Component Value Date   HGBA1C 11.3 03/19/2014   Hypertension: See previous telephone notes for medication refills. 01/19 105/60, borderline low so I  decreased his metoprolol to 50 mg twice a day. By the records it appears he has continued on 100 mg does. He has requested HCTZ. He takes ramipril 10 mg daily.   BP Readings from Last 3 Encounters:  11/21/14 132/78  06/06/14 114/68  06/03/14 108/62   ADD: He is requesting a refill of adderall today. Last prescribed in October of 2015. Previous patient of Raymond Pugh, but apparently was dismissed from his practice due to non-compliance. He takes adderall XR 20 mg, two capsules each day. Pt had ADD testing at Raymond Pugh office several years ago. He has not taken medication in sometime. He only takes the adderall two to three times a day when he has very busy days.  Anxiety: Xanax was last filled in October 2015, per that note she was taking this for generalized anxiety disorder. #30 prescribed on October 27 th 2015. He take them as needed, he usually splits in half but will take a full pill if he has a bad day.  Hyperlipidemia: He is out of medication for his high cholesterol. He has been taking fenofibrate.   Lab Results  Component Value Date   CHOL 161 03/19/2014   HDL 39* 03/19/2014   LDLCALC 74 03/19/2014   TRIG 238* 03/19/2014   CHOLHDL 4.1 03/19/2014   Patient Active Problem List   Diagnosis Date Noted  . Liver abscess 06/24/2014  .  Eyelid lesion 05/29/2012  . Obesity 08/25/2010  . BARRETTS ESOPHAGUS 07/03/2010  . Iron deficiency anemia, unspecified 11/16/2009  . HYPERLIPIDEMIA 11/04/2008  . ADHD 08/14/2007  . MOLE 03/13/2007  . Uncontrolled type 2 diabetes mellitus with peripheral angiopathy 03/13/2007  . HYPERTENSION 11/03/2006   Past Medical History  Diagnosis Date  . Hypertension   . Hyperlipemia   . Diabetes mellitus   . Barrett's esophagus   . Hiatal hernia   . ADHD (attention deficit hyperactivity disorder)   . Anemia   . Myocarditis     h/o mypocarditis, caused cardiomyopathy-- resolved    Past Surgical History  Procedure Laterality Date  . Hydrocele excision /  repair    . Retinal detachment surgery    . Radiokeratotomy    . Eye surgery    . Abscess drain liver perc ([facility] hx)      klebsiella on liver pa states    No Known Allergies Prior to Admission medications   Medication Sig Start Date End Date Taking? Authorizing Provider  ALPRAZolam Duanne Moron) 1 MG tablet Take 1 tablet (1 mg total) by mouth daily as needed (for anxiety). 03/19/14  Yes Raymond Beck, FNP  amphetamine-dextroamphetamine (ADDERALL XR) 20 MG 24 hr capsule Take 2 Capsules by mouth every day 03/19/14  Yes Raymond Beck, FNP  BD PEN NEEDLE NANO U/F 32G X 4 MM MISC Use 4x a day 06/06/14  Yes Raymond Kingdom, MD  ciprofloxacin (CIPRO) 250 MG tablet Take 250 mg by mouth 2 (two) times daily.   Yes Historical Provider, MD  fenofibrate micronized (LOFIBRA) 134 MG capsule TAKE 1 CAPSULE BY MOUTH EVERY MORNING BEFORE BREAKFAST. PATIENT NEEDS OFFICE VISIT FOR ADDITIONAL REFILLS 11/15/14  Yes Raymond Bale, PA-C  glipiZIDE (GLUCOTROL) 10 MG tablet TAKE 1 TABLET BY MOUTH TWICE DAILY BEFORE A MEAL 06/25/14  Yes Raymond D English, PA  glucose blood (ONE TOUCH ULTRA TEST) test strip Use to test blood sugar 3 times daily as instructed. 06/07/14  Yes Raymond Kingdom, MD  hydrochlorothiazide (HYDRODIURIL) 25 MG tablet Take 1 tablet (25 mg total) by mouth daily. PATIENT NEEDS OFFICE VISIT FOR ADDITIONAL REFILLS 11/15/14  Yes Raymond Bale, PA-C  Insulin Glargine (LANTUS SOLOSTAR) 100 UNIT/ML Solostar Pen INJECT 20 UNITS UNDER THE SKIN EVERY NIGHT AT BEDTIME 06/06/14  Yes Raymond Kingdom, MD  metFORMIN (GLUCOPHAGE) 1000 MG tablet Take 1 tablet (1,000 mg total) by mouth 2 (two) times daily. 10/05/14  Yes Raymond D English, PA  metoprolol (LOPRESSOR) 100 MG tablet TAKE 1 TABLET BY MOUTH TWICE DAILY 10/03/14  Yes Raymond Pugh D English, PA  pantoprazole (PROTONIX) 40 MG tablet TAKE 1 TABLET BY MOUTH EVERY MORNING BEFORE BREAKFAST 06/25/14  Yes Raymond Heckle English, PA  ramipril (ALTACE) 10 MG capsule TAKE 1  CAPSULE BY MOUTH TWICE DAILY 10/03/14  Yes Raymond Bachelor, PA   History   Social History  . Marital Status: Married    Spouse Name: N/A  . Number of Children: 2  . Years of Education: N/A   Occupational History  . student, part time job     [employer], Architect   Social History Main Topics  . Smoking status: Never Smoker   . Smokeless tobacco: Never Used  . Alcohol Use: 0.6 oz/week    1 Cans of beer per week     Comment: socially   . Drug Use: No  . Sexual Activity: Not on file     Comment: not asked   Other Topics Concern  .  Not on file   Social History Narrative   2 children + 2 step children ---    Has a part time job, student    Exercise swimming   Review of Systems  Constitutional: Negative for fatigue and unexpected weight change.  Eyes: Negative for visual disturbance.  Respiratory: Negative for cough, chest tightness and shortness of breath.   Cardiovascular: Negative for chest pain, palpitations and leg swelling.  Gastrointestinal: Negative for abdominal pain and blood in stool.  Neurological: Negative for dizziness, light-headedness and headaches.      Objective:  BP 132/78 mmHg  Pulse 76  Temp(Src) 98.3 F (36.8 C) (Oral)  Resp 17  Ht '5\' 8"'  (1.727 m)  Wt 216 lb 12.8 oz (98.34 kg)  BMI 32.97 kg/m2  SpO2 98% Physical Exam  Constitutional: He is oriented to person, place, and time. He appears well-developed and well-nourished. No distress.  HENT:  Head: Normocephalic and atraumatic.  Eyes: EOM are normal. Pupils are equal, round, and reactive to light.  Neck: Normal range of motion. No JVD present. Carotid bruit is not present.  Cardiovascular: Normal rate, regular rhythm and normal heart sounds.   No murmur heard. Pulmonary/Chest: Effort normal and breath sounds normal. No respiratory distress. He has no rales.  Abdominal: Soft. He exhibits no distension. There is no tenderness.  Musculoskeletal: Normal range of motion. He exhibits no edema.    Neurological: He is alert and oriented to person, place, and time.  Skin: Skin is warm and dry.  Psychiatric: He has a normal mood and affect. His behavior is normal.  Nursing note and vitals reviewed.  Results for orders placed or performed in visit on 11/21/14  POCT glucose (manual entry)  Result Value Ref Range   POC Glucose 225 (A) 70 - 99 mg/dl       Assessment & Plan:   Raymond Pugh is a 54 y.o. male ADD (attention deficit disorder) - Plan: amphetamine-dextroamphetamine (ADDERALL XR) 20 MG 24 hr capsule  Intermittent, with rare use of Adderall. Agreed on refill this today, but can discuss further next visit. Discussed risks of this medication with underlying diabetes especially when uncontrolled and risks of cardiac disease. Understanding expressed.  Generalized anxiety disorder - Plan: ALPRAZolam (XANAX) 1 MG tablet  -Intermittent, appears to be more situational, or reactive anxiety. Infrequent dosing of alprazolam. Advised to try half a dose when needed refilled today but can further discuss at next visit. Side effects discussed.  Essential hypertension - Plan: COMPLETE METABOLIC PANEL WITH GFR, ramipril (ALTACE) 10 MG capsule, hydrochlorothiazide (HYDRODIURIL) 25 MG tablet, metoprolol (LOPRESSOR) 100 MG tablet  -Controlled on current doses today. He has been back on the 100 mg dose of metoprolol and tolerating this okay.  Hyperlipidemia - Plan: lovastatin (MEVACOR) 20 MG tablet, Lipid panel  -Prior on fenofibrate. Due to underlying diabetes and anticipated elevated 10 year ASCVD risk, change to statin.  No known allergy or intolerance to these in the past, but will start on low-dose lovastatin initially.  If he tolerates this then would consider Lipitor or Crestor depending on his readings. Return for fasting lipid panel as lab only visit as he was not fasting at this visit  Diabetes mellitus type 2 with neurological manifestations - Plan: Hemoglobin A1c, POCT glucose (manual  entry), Microalbumin, urine, metFORMIN (GLUCOPHAGE) 1000 MG tablet, glipiZIDE (GLUCOTROL) 10 MG tablet  -Uncontrolled in office, and has not followed up with endocrinology as planned from his previous. Had been nonadherent to follow-up at that  time.  Also not followed up with me as planned from previous visit. Stressed risks of diabetes and further possible complications if this remains out-of-control and need for follow up. Understanding expressed.   Discussed plan from his last endocrinology visit but he would like to remain on glipizide at this time and not take Humalog. Discussed this needs to be addressed with his endocrinologist and needs to follow-up as planned with her. He will call her office to schedule.  For now I refilled his metformin and glipizide at previous doses, and continue Lantus at same dose.  Meds ordered this encounter  Medications  . ramipril (ALTACE) 10 MG capsule    Sig: Take 1 capsule (10 mg total) by mouth 2 (two) times daily.    Dispense:  180 capsule    Refill:  0  . hydrochlorothiazide (HYDRODIURIL) 25 MG tablet    Sig: Take 1 tablet (25 mg total) by mouth daily.    Dispense:  90 tablet    Refill:  0  . metoprolol (LOPRESSOR) 100 MG tablet    Sig: Take 1 tablet (100 mg total) by mouth 2 (two) times daily.    Dispense:  90 tablet    Refill:  0  . metFORMIN (GLUCOPHAGE) 1000 MG tablet    Sig: Take 1 tablet (1,000 mg total) by mouth 2 (two) times daily.    Dispense:  60 tablet    Refill:  1  . glipiZIDE (GLUCOTROL) 10 MG tablet    Sig: TAKE 1 TABLET BY MOUTH TWICE DAILY BEFORE A MEAL    Dispense:  60 tablet    Refill:  1  . amphetamine-dextroamphetamine (ADDERALL XR) 20 MG 24 hr capsule    Sig: Take 1 Capsules by mouth every day prn    Dispense:  30 capsule    Refill:  0  . ALPRAZolam (XANAX) 1 MG tablet    Sig: Take 0.5-1 tablets (0.5-1 mg total) by mouth daily as needed (for anxiety).    Dispense:  30 tablet    Refill:  0  . lovastatin (MEVACOR) 20 MG  tablet    Sig: Take 1 tablet (20 mg total) by mouth at bedtime.    Dispense:  30 tablet    Refill:  2   Patient Instructions  You should receive a call or letter about your lab results within the next week to 10 days.  Return for cholesterol test (fasting for 8 hours) in next week.  We can try low dose statin medication instead of fenofibrate as this may be more beneficial with you having diabetes. Return in 6 weeks to recheck liver tests and cholesterol.  If any new muscle aches, or other new side effects of this medicine - return right away to determine if these are due to the cholesterol medicine.  Call Raymond Pugh fo follow up in next month as you are overdue for this.  Follow up with me in 6 weeks to recheck liver tests and follow up on some of the medicines we discussed today.  Keep a record of your blood pressures and blood sugars outside of the office and bring them to the next office visit and your visit to Raymond Pugh.  Return to the clinic or go to the nearest emergency room if any of your symptoms worsen or new symptoms occur.   Diabetes and Standards of Medical Care Diabetes is complicated. You may find that your diabetes team includes a dietitian, nurse, diabetes educator, eye doctor, and more. To  help everyone know what is going on and to help you get the care you deserve, the following schedule of care was developed to help keep you on track. Below are the tests, exams, vaccines, medicines, education, and plans you will need. HbA1c test This test shows how well you have controlled your glucose over the past 2-3 months. It is used to see if your diabetes management plan needs to be adjusted.   It is performed at least 2 times a year if you are meeting treatment goals.  It is performed 4 times a year if therapy has changed or if you are not meeting treatment goals. Blood pressure test  This test is performed at every routine medical visit. The goal is less than 140/90 mm Hg  for most people, but 130/80 mm Hg in some cases. Ask your health care provider about your goal. Dental exam  Follow up with the dentist regularly. Eye exam  If you are diagnosed with type 1 diabetes as a child, get an exam upon reaching the age of 23 years or older and have had diabetes for 3-5 years. Yearly eye exams are recommended after that initial eye exam.  If you are diagnosed with type 1 diabetes as an adult, get an exam within 5 years of diagnosis and then yearly.  If you are diagnosed with type 2 diabetes, get an exam as soon as possible after the diagnosis and then yearly. Foot care exam  Visual foot exams are performed at every routine medical visit. The exams check for cuts, injuries, or other problems with the feet.  A comprehensive foot exam should be done yearly. This includes visual inspection as well as assessing foot pulses and testing for loss of sensation.  Check your feet nightly for cuts, injuries, or other problems with your feet. Tell your health care provider if anything is not healing. Kidney function test (urine microalbumin)  This test is performed once a year.  Type 1 diabetes: The first test is performed 5 years after diagnosis.  Type 2 diabetes: The first test is performed at the time of diagnosis.  A serum creatinine and estimated glomerular filtration rate (eGFR) test is done once a year to assess the level of chronic kidney disease (CKD), if present. Lipid profile (cholesterol, HDL, LDL, triglycerides)  Performed every 5 years for most people.  The goal for LDL is less than 100 mg/dL. If you are at high risk, the goal is less than 70 mg/dL.  The goal for HDL is 40 mg/dL-50 mg/dL for men and 50 mg/dL-60 mg/dL for women. An HDL cholesterol of 60 mg/dL or higher gives some protection against heart disease.  The goal for triglycerides is less than 150 mg/dL. Influenza vaccine, pneumococcal vaccine, and hepatitis B vaccine  The influenza vaccine is  recommended yearly.  It is recommended that people with diabetes who are over 75 years old get the pneumonia vaccine. In some cases, two separate shots may be given. Ask your health care provider if your pneumonia vaccination is up to date.  The hepatitis B vaccine is also recommended for adults with diabetes. Diabetes self-management education  Education is recommended at diagnosis and ongoing as needed. Treatment plan  Your treatment plan is reviewed at every medical visit. Document Released: 03/07/2009 Document Revised: 09/24/2013 Document Reviewed: 10/10/2012 [facility] Patient Information 2015 [place], [place]. This information is not intended to replace advice given to you by your health care provider. Make sure you discuss any questions you have with  your health care provider.     I personally performed the services described in this documentation, which was scribed in my presence. The recorded information has been reviewed and considered, and addended by me as needed.

## 2014-11-22 LAB — COMPLETE METABOLIC PANEL WITH GFR
ALK PHOS: 68 U/L (ref 39–117)
ALT: 21 U/L (ref 0–53)
AST: 19 U/L (ref 0–37)
Albumin: 4 g/dL (ref 3.5–5.2)
BILIRUBIN TOTAL: 0.3 mg/dL (ref 0.2–1.2)
BUN: 15 mg/dL (ref 6–23)
CO2: 26 meq/L (ref 19–32)
Calcium: 8.9 mg/dL (ref 8.4–10.5)
Chloride: 99 mEq/L (ref 96–112)
Creat: 0.74 mg/dL (ref 0.50–1.35)
GLUCOSE: 226 mg/dL — AB (ref 70–99)
POTASSIUM: 3.6 meq/L (ref 3.5–5.3)
Sodium: 138 mEq/L (ref 135–145)
Total Protein: 7.2 g/dL (ref 6.0–8.3)

## 2014-11-22 LAB — HEMOGLOBIN A1C
Hgb A1c MFr Bld: 8.7 % — ABNORMAL HIGH (ref <5.7)
Mean Plasma Glucose: 203 mg/dL — ABNORMAL HIGH (ref <117)

## 2014-11-22 LAB — MICROALBUMIN, URINE: Microalb, Ur: 0.4 mg/dL (ref <2.0)

## 2014-11-27 ENCOUNTER — Other Ambulatory Visit: Payer: Self-pay | Admitting: Physician Assistant

## 2014-12-06 ENCOUNTER — Other Ambulatory Visit: Payer: Self-pay

## 2014-12-06 NOTE — Telephone Encounter (Signed)
Pharm sent req for RFs of pantoprazole. Dr Carlota Raspberry, you just saw pt for check up, but don't see this med discussed. Do you want to RF?

## 2014-12-09 MED ORDER — PANTOPRAZOLE SODIUM 40 MG PO TBEC: DELAYED_RELEASE_TABLET | ORAL | Status: DC

## 2014-12-09 NOTE — Telephone Encounter (Signed)
Refilled.  Can discuss further at next ov.

## 2014-12-25 ENCOUNTER — Other Ambulatory Visit (INDEPENDENT_AMBULATORY_CARE_PROVIDER_SITE_OTHER): Payer: BLUE CROSS/BLUE SHIELD | Admitting: *Deleted

## 2014-12-25 ENCOUNTER — Other Ambulatory Visit: Payer: Self-pay | Admitting: *Deleted

## 2014-12-25 DIAGNOSIS — E785 Hyperlipidemia, unspecified: Secondary | ICD-10-CM | POA: Diagnosis not present

## 2014-12-25 LAB — LIPID PANEL
CHOL/HDL RATIO: 3.8 ratio (ref <=5.0)
Cholesterol: 156 mg/dL (ref 125–200)
HDL: 41 mg/dL (ref >=40)
LDL Cholesterol: 72 mg/dL (ref <130)
Triglycerides: 213 mg/dL — ABNORMAL HIGH (ref <150)
VLDL: 43 mg/dL — ABNORMAL HIGH (ref <30)

## 2015-01-08 ENCOUNTER — Other Ambulatory Visit: Payer: Self-pay | Admitting: Family Medicine

## 2015-01-23 ENCOUNTER — Other Ambulatory Visit: Payer: Self-pay | Admitting: Family Medicine

## 2015-02-10 ENCOUNTER — Encounter: Payer: BLUE CROSS/BLUE SHIELD | Admitting: Family Medicine

## 2015-02-25 ENCOUNTER — Other Ambulatory Visit: Payer: Self-pay | Admitting: Family Medicine

## 2015-02-25 ENCOUNTER — Other Ambulatory Visit: Payer: Self-pay | Admitting: Physician Assistant

## 2015-04-02 ENCOUNTER — Other Ambulatory Visit: Payer: Self-pay | Admitting: Physician Assistant

## 2015-04-21 ENCOUNTER — Other Ambulatory Visit: Payer: Self-pay | Admitting: Physician Assistant

## 2015-04-22 NOTE — Telephone Encounter (Signed)
Patient will be calling for follow up appointment advised prescription sent

## 2015-05-06 ENCOUNTER — Other Ambulatory Visit: Payer: Self-pay | Admitting: Physician Assistant

## 2015-05-06 NOTE — Telephone Encounter (Signed)
Spoke with patient he is going to come into the walk in on 12/17

## 2015-05-09 NOTE — Telephone Encounter (Signed)
LM   Patient needs to  [facility] for refills.  Sent in 15 day supply

## 2015-05-10 ENCOUNTER — Ambulatory Visit (INDEPENDENT_AMBULATORY_CARE_PROVIDER_SITE_OTHER): Payer: BLUE CROSS/BLUE SHIELD | Admitting: Family Medicine

## 2015-05-10 VITALS — BP 126/78 | HR 84 | Temp 97.9°F | Resp 16 | Ht 68.0 in | Wt 216.0 lb

## 2015-05-10 DIAGNOSIS — F411 Generalized anxiety disorder: Secondary | ICD-10-CM | POA: Diagnosis not present

## 2015-05-10 DIAGNOSIS — Z794 Long term (current) use of insulin: Secondary | ICD-10-CM | POA: Diagnosis not present

## 2015-05-10 DIAGNOSIS — Z8249 Family history of ischemic heart disease and other diseases of the circulatory system: Secondary | ICD-10-CM

## 2015-05-10 DIAGNOSIS — I10 Essential (primary) hypertension: Secondary | ICD-10-CM | POA: Diagnosis not present

## 2015-05-10 DIAGNOSIS — F909 Attention-deficit hyperactivity disorder, unspecified type: Secondary | ICD-10-CM

## 2015-05-10 DIAGNOSIS — E119 Type 2 diabetes mellitus without complications: Secondary | ICD-10-CM

## 2015-05-10 DIAGNOSIS — Z79899 Other long term (current) drug therapy: Secondary | ICD-10-CM

## 2015-05-10 DIAGNOSIS — F988 Other specified behavioral and emotional disorders with onset usually occurring in childhood and adolescence: Secondary | ICD-10-CM

## 2015-05-10 DIAGNOSIS — K219 Gastro-esophageal reflux disease without esophagitis: Secondary | ICD-10-CM

## 2015-05-10 DIAGNOSIS — E785 Hyperlipidemia, unspecified: Secondary | ICD-10-CM

## 2015-05-10 DIAGNOSIS — R9431 Abnormal electrocardiogram [ECG] [EKG]: Secondary | ICD-10-CM | POA: Diagnosis not present

## 2015-05-10 LAB — GLUCOSE, POCT (MANUAL RESULT ENTRY): POC Glucose: 343 mg/dl — AB (ref 70–99)

## 2015-05-10 LAB — POCT GLYCOSYLATED HEMOGLOBIN (HGB A1C): Hemoglobin A1C: 11.2

## 2015-05-10 MED ORDER — RAMIPRIL 10 MG PO CAPS
ORAL_CAPSULE | ORAL | Status: DC
Start: 2015-05-10 — End: 2015-06-10

## 2015-05-10 MED ORDER — HYDROCHLOROTHIAZIDE 25 MG PO TABS: ORAL_TABLET | ORAL | Status: DC

## 2015-05-10 MED ORDER — AMPHETAMINE-DEXTROAMPHET ER 20 MG PO CP24: ORAL_CAPSULE | ORAL | Status: DC

## 2015-05-10 MED ORDER — PANTOPRAZOLE SODIUM 40 MG PO TBEC: DELAYED_RELEASE_TABLET | ORAL | Status: DC

## 2015-05-10 MED ORDER — METOPROLOL TARTRATE 100 MG PO TABS: ORAL_TABLET | ORAL | Status: DC

## 2015-05-10 MED ORDER — METFORMIN HCL 1000 MG PO TABS: ORAL_TABLET | ORAL | Status: DC

## 2015-05-10 MED ORDER — ALPRAZOLAM 1 MG PO TABS: 0.5000 mg | ORAL_TABLET | Freq: Every day | ORAL | Status: DC | PRN

## 2015-05-10 MED ORDER — GLIPIZIDE 10 MG PO TABS
ORAL_TABLET | ORAL | Status: DC
Start: 2015-05-10 — End: 2015-05-13

## 2015-05-10 MED ORDER — LOVASTATIN 20 MG PO TABS: ORAL_TABLET | ORAL | Status: DC

## 2015-05-10 NOTE — Progress Notes (Addendum)
Subjective:  This chart was scribed for [name], MD by [facility], medical scribe at [facility].The patient was seen in exam room 04 and the patient's care was started at 3:50 PM.   Patient ID: Raymond Pugh, male    DOB: 07-27-1960, 54 y.o.   MRN: [id number] Chief Complaint  Patient presents with  . Follow-up    patient just states that it was a follow up from his last visit  . Medication Refill    all   HPI HPI Comments: Raymond Pugh is a 54 y.o. male with a hx of DM, HLD,  HTN, ADD who presents to [facility] for multiple medication refill. Last seen by me on 11/21/2014.  ADD with generalized anxiety disorder: Had been on Adderall XR 40 mg daily, previous pt of Dr. Larose Kells. Also takes xanax as needed for situational anxiety. Regards to his ADD refilled the adderall but cautioned risk or use with underlying diabetes and risk of cardiac disease. 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 10 xanax left, rarely takes these.  Depression screen [facility] 2/9 05/10/2015 11/21/2014 06/03/2014 03/19/2014  Decreased Interest 0 0 0 0  Down, Depressed, Hopeless 0 0 0 0  PHQ - 2 Score 0 0 0 0   HTN: Stable at last visit, on HCTZ, Altace, metoprolol. Occasionally checking BP outside the office, typically around 120/80. No CP, HA, lightheaded, dizziness, black tarry stool, SOB, and abdominal pain. Stress test with Dr. Lovena Le more than 5 years ago. Heart disease in his father around age 50.  HLD: Changed to lovastatin 20 mg daily at last visit, given diabetes and cardiac risk. Not fasting at that visit. So he returned on Aug 12 th. No side effects from the lovastatin.  Lab Results  Component Value Date   CHOL 156 12/25/2014   HDL 41 12/25/2014   LDLCALC 72 12/25/2014   TRIG 213* 12/25/2014   CHOLHDL 3.8 12/25/2014   Diabetes (insulin dependent): Lab Results  Component Value Date   HGBA1C 8.7* 11/21/2014   Lab Results    Component Value Date   MICROALBUR 0.4 11/21/2014   Wt Readings from Last 3 Encounters:  05/10/15 216 lb (97.977 kg)  11/21/14 216 lb 12.8 oz (98.34 kg)  06/06/14 197 lb 12.8 oz (89.721 kg)    Hx of nonadherent, pt of Dr. Cruzita Lederer. Currently taking metformin, lantus and Humalog with meals. Planned on follow up with endocrinology in January of this year but he had not followed up. Diabetes was uncontrolled with a blood sugar of 225 in our office at the June visit. Did not take Humalog but instead stayed on glipizide. Advised to follow up with Dr. Cruzita Lederer because this was no the plan at the January visit (she stopped glipizide and planned on adding Humalog with meals). Advised to call her office for follow up. Advised to follow up with me in 6 weeks from the June visit, I have not seen him since that time.  He has not seen Dr. Cruzita Lederer, because he has had to cancel on her. Compliant with his medications, only a few missed doses. Not checking his blood sugar at home. No complications with is heart, eyes, nerves, feet.   GERD: Takes Protonix once daily. Zantac and Pepcid does not help. Omeprazole  in the past has helped.  Patient Active Problem List   Diagnosis Date Noted  . Liver abscess 06/24/2014  . Eyelid lesion 05/29/2012  .  Obesity 08/25/2010  . BARRETTS ESOPHAGUS 07/03/2010  . Iron deficiency anemia, unspecified 11/16/2009  . HYPERLIPIDEMIA 11/04/2008  . ADHD 08/14/2007  . MOLE 03/13/2007  . Uncontrolled type 2 diabetes mellitus with peripheral angiopathy ([place]) 03/13/2007  . HYPERTENSION 11/03/2006   Past Medical History  Diagnosis Date  . Hypertension   . Hyperlipemia   . Diabetes mellitus   . Barrett's esophagus   . Hiatal hernia   . ADHD (attention deficit hyperactivity disorder)   . Anemia   . Myocarditis ([place])     h/o mypocarditis, caused cardiomyopathy-- resolved    Past Surgical History  Procedure Laterality Date  . Hydrocele excision / repair    . Retinal  detachment surgery    . Radiokeratotomy    . Eye surgery    . Abscess drain liver perc ([facility] hx)      klebsiella on liver pa states    No Known Allergies Prior to Admission medications   Medication Sig Start Date End Date Taking? Authorizing Provider  ALPRAZolam Duanne Moron) 1 MG tablet Take 0.5-1 tablets (0.5-1 mg total) by mouth daily as needed (for anxiety). 11/21/14  Yes Wendie Agreste, MD  amphetamine-dextroamphetamine (ADDERALL XR) 20 MG 24 hr capsule Take 1 Capsules by mouth every day prn 11/21/14  Yes Wendie Agreste, MD  BD PEN NEEDLE NANO U/F 32G X 4 MM MISC Use 4x a day 06/06/14  Yes [name], MD  glipiZIDE (GLUCOTROL) 10 MG tablet TAKE 1 TABLET BY MOUTH TWICE DAILY BEFORE MEALS 05/06/15  Yes Wendie Agreste, MD  glucose blood (ONE TOUCH ULTRA TEST) test strip Use to test blood sugar 3 times daily as instructed. 06/07/14  Yes [name], MD  hydrochlorothiazide (HYDRODIURIL) 25 MG tablet TAKE 1 TABLET (25 MG) BY MOUTH DAILY 02/25/15  Yes Chelle Jeffery, PA-C  Insulin Glargine (LANTUS SOLOSTAR) 100 UNIT/ML Solostar Pen INJECT 20 UNITS UNDER THE SKIN EVERY NIGHT AT BEDTIME 06/06/14  Yes [name], MD  lovastatin (MEVACOR) 20 MG tablet TAKE 1 TABLET BY MOUTH EVERY NIGHT AT BEDTIME "NO MORE REFILLS WITHOUT OFFICE VISIT" 05/09/15  Yes Chelle Jeffery, PA-C  metFORMIN (GLUCOPHAGE) 1000 MG tablet TAKE 1 TABLET BY MOUTH TWICE DAILY  "NO MORE REFILLS WITHOUT OFFICE VISIT" 05/09/15  Yes Chelle Jeffery, PA-C  metoprolol (LOPRESSOR) 100 MG tablet TAKE 1 TABLET(100 MG) BY MOUTH TWICE DAILY 01/08/15  Yes Wendie Agreste, MD  pantoprazole (PROTONIX) 40 MG tablet TAKE 1 TABLET BY MOUTH EVERY MORNING BEFORE BREAKFAST 12/09/14  Yes Wendie Agreste, MD  ramipril (ALTACE) 10 MG capsule TAKE 1 CAPSULE (10 MG) BY MOUTH TWICE DAILY 02/25/15  Yes Harrison Mons, PA-C   Social History   Social History  . Marital Status: Married    Spouse Name: N/A  . Number of Children: 2  . Years of  Education: N/A   Occupational History  . student, part time job     [employer], Architect   Social History Main Topics  . Smoking status: Never Smoker   . Smokeless tobacco: Never Used  . Alcohol Use: 0.6 oz/week    1 Cans of beer per week     Comment: socially   . Drug Use: No  . Sexual Activity: Not on file     Comment: not asked   Other Topics Concern  . Not on file   Social History Narrative   2 children + 2 step children ---    Has a part time job, Ship broker    Exercise swimming  Review of Systems  Constitutional: Negative for fatigue and unexpected weight change.  Eyes: Negative for visual disturbance.  Respiratory: Negative for cough, chest tightness and shortness of breath.   Cardiovascular: Negative for chest pain, palpitations and leg swelling.  Gastrointestinal: Negative for abdominal pain and blood in stool.  Neurological: Negative for dizziness, light-headedness and headaches.      Objective:  BP 126/78 mmHg  Pulse 84  Temp(Src) 97.9 F (36.6 C)  Resp 16  Ht 5\' 8"  (1.727 m)  Wt 216 lb (97.977 kg)  BMI 32.85 kg/m2  SpO2 96% Physical Exam  Constitutional: He is oriented to person, place, and time. He appears well-developed and well-nourished. No distress.  HENT:  Head: Normocephalic and atraumatic.  Eyes: EOM are normal. Pupils are equal, round, and reactive to light.  Neck: Normal range of motion. No JVD present. Carotid bruit is not present.  Cardiovascular: Normal rate, regular rhythm and normal heart sounds.   No murmur heard. Pulmonary/Chest: Effort normal and breath sounds normal. No respiratory distress. He has no rales.  Musculoskeletal: Normal range of motion. He exhibits no edema.  Neurological: He is alert and oriented to person, place, and time.  Microfilament testing intact bilaterally.   Skin: Skin is warm and dry.  Psychiatric: He has a normal mood and affect. His behavior is normal.  Nursing note and vitals reviewed. EKG: sinus rhythm  possible old inferior apical infarct left axis no prior EKG available for review.   Results for orders placed or performed in visit on 05/10/15  POCT glycosylated hemoglobin (Hb A1C)  Result Value Ref Range   Hemoglobin A1C 11.2   POCT glucose (manual entry)  Result Value Ref Range   POC Glucose 343 (A) 70 - 99 mg/dl       Assessment & Plan:   Raymond Pugh is a 54 y.o. male Type 2 diabetes mellitus without complication, with long-term current use of insulin ([place]) - Plan: POCT glycosylated hemoglobin (Hb A1C), POCT glucose (manual entry), COMPLETE METABOLIC PANEL WITH GFR, EKG 12-Lead, metFORMIN (GLUCOPHAGE) 1000 MG tablet, Ambulatory referral to Cardiology, DISCONTINUED: glipiZIDE (GLUCOTROL) 10 MG tablet, CANCELED: Ambulatory referral to Cardiology  -uncontrolled. Nonadherent to follow up with endocrinologist  -increase lantus by 2 units every 2-3 nights until readings are below 200, then stay at that dose. hypoglycmeic precautions until follow up with me or Dr. Cruzita Lederer (advised to schedule follow up as planned).  Risks of uncontrolled diabetes discussed. Continue same doses of other meds for now, but follow up in next 2 weeks with me or endocrine with home readings.    Hyperlipidemia - Plan: COMPLETE METABOLIC PANEL WITH GFR, lovastatin (MEVACOR) 20 MG tablet, Ambulatory referral to Cardiology, CANCELED: Ambulatory referral to Cardiology  - controlled at last check.  With diabetes - may need to consider change to Lipitor or Crestor, but can be discussed with cardiology.   Essential hypertension - Plan: COMPLETE METABOLIC PANEL WITH GFR, hydrochlorothiazide (HYDRODIURIL) 25 MG tablet, metoprolol (LOPRESSOR) 100 MG tablet, ramipril (ALTACE) 10 MG capsule, Ambulatory referral to Cardiology, CANCELED: Ambulatory referral to Cardiology  - stable. No med changes.   ADD (attention deficit disorder) - Plan: amphetamine-dextroamphetamine (ADDERALL XR) 20 MG 24 hr capsule High risk medication  use - Plan: EKG 12-Lead, Ambulatory referral to Cardiology, CANCELED: Ambulatory referral to Cardiology  - advised to hold on use of Adderall for ADD until cleared by cardiology.   Nonspecific abnormal electrocardiogram (ECG) (EKG) - Plan: Ambulatory referral to Cardiology Family history of cardiac  disorder in father - Plan: EKG 12-Lead, Ambulatory referral to Cardiology, CANCELED: Ambulatory referral to Cardiology  - FH of heart disease and personal RF's of uncontrolled DM, HLD, HTN, age. Possible abnormal EKG, but asymptomatic.   Generalized anxiety disorder - Plan: ALPRAZolam (XANAX) 1 MG tablet  - refilled Xanax for intermittent use. If incr use, consider SSRI  Gastroesophageal reflux disease, esophagitis presence not specified - Plan: pantoprazole (PROTONIX) 40 MG tablet  - trigger avoidance, try to wean to QOD dosing, or H2 blocker if this controls sx's.   ER/RTC precautions discussed for above.   Meds ordered this encounter  Medications  . amphetamine-dextroamphetamine (ADDERALL XR) 20 MG 24 hr capsule    Sig: Take 1 Capsules by mouth every day prn    Dispense:  30 capsule    Refill:  0  . DISCONTD: glipiZIDE (GLUCOTROL) 10 MG tablet    Sig: TAKE 1 TABLET BY MOUTH TWICE DAILY BEFORE MEALS    Dispense:  180 tablet    Refill:  0  . ALPRAZolam (XANAX) 1 MG tablet    Sig: Take 0.5-1 tablets (0.5-1 mg total) by mouth daily as needed (for anxiety).    Dispense:  30 tablet    Refill:  0  . hydrochlorothiazide (HYDRODIURIL) 25 MG tablet    Sig: TAKE 1 TABLET (25 MG) BY MOUTH DAILY    Dispense:  90 tablet    Refill:  0  . lovastatin (MEVACOR) 20 MG tablet    Sig: TAKE 1 TABLET BY MOUTH EVERY NIGHT AT BEDTIME    Dispense:  90 tablet    Refill:  0  . metFORMIN (GLUCOPHAGE) 1000 MG tablet    Sig: TAKE 1 TABLET BY MOUTH TWICE DAILY    Dispense:  180 tablet    Refill:  0  . metoprolol (LOPRESSOR) 100 MG tablet    Sig: TAKE 1 TABLET(100 MG) BY MOUTH TWICE DAILY    Dispense:  180  tablet    Refill:  0  . pantoprazole (PROTONIX) 40 MG tablet    Sig: TAKE 1 TABLET BY MOUTH EVERY MORNING BEFORE BREAKFAST    Dispense:  90 tablet    Refill:  0  . ramipril (ALTACE) 10 MG capsule    Sig: TAKE 1 CAPSULE (10 MG) BY MOUTH TWICE DAILY    Dispense:  180 capsule    Refill:  0   Patient Instructions   As discussed last visit, it is very important you follow up with your endocrinologist.  Your diabetes is out of control today and much worse last visit. Increase the Lantus by 2 units every 3 days until your  readings are under 200,  Then stay at that dose.. Follow-up with me in 2 weeks with your home blood sugar readings if you have not been able to see  Your endocrinologist by that point in time.  if any nausea or vomiting, abdominal pain, blurry vision, or any new or worsening symptoms, return to the clinic or the emergency room  For heartburn, avoid the foods as listed below that are known to trigger heartburn. If only mild symptoms, can try Zantac or Pepcid in place of the prescription acid blocker. However if needed you can take the pantoprazole.   I will refer you to a cardiologist as your EKG does not appear normal,  But I do not have one to compare to today. If you have any chest pain, tightness, shortness of breath  - be seen in the emergency room immediately  or call 911.  I would recommend against use of the Adderall for now until you have been seen and cleared by cardiology to do so.  Return to the clinic or go to the nearest emergency room if any of your symptoms worsen or new symptoms occur.  Hyperglycemia Hyperglycemia occurs when the glucose (sugar) in your blood is too high. Hyperglycemia can happen for many reasons, but it most often happens to people who do not know they have diabetes or are not managing their diabetes properly.  CAUSES  Whether you have diabetes or not, there are other causes of hyperglycemia. Hyperglycemia can occur when you have diabetes, but it  can also occur in other situations that you might not be as aware of, such as: Diabetes  If you have diabetes and are having problems controlling your blood glucose, hyperglycemia could occur because of some of the following reasons:  Not following your meal plan.  Not taking your diabetes medications or not taking it properly.  Exercising less or doing less activity than you normally do.  Being sick. Pre-diabetes  This cannot be ignored. Before people develop Type 2 diabetes, they almost always have "pre-diabetes." This is when your blood glucose levels are higher than normal, but not yet high enough to be diagnosed as diabetes. Research has shown that some long-term damage to the body, especially the heart and circulatory system, may already be occurring during pre-diabetes. If you take action to manage your blood glucose when you have pre-diabetes, you may delay or prevent Type 2 diabetes from developing. Stress  If you have diabetes, you may be "diet" controlled or on oral medications or insulin to control your diabetes. However, you may find that your blood glucose is higher than usual in the hospital whether you have diabetes or not. This is often referred to as "stress hyperglycemia." Stress can elevate your blood glucose. This happens because of hormones put out by the body during times of stress. If stress has been the cause of your high blood glucose, it can be followed regularly by your caregiver. That way he/she can make sure your hyperglycemia does not continue to get worse or progress to diabetes. Steroids  Steroids are medications that act on the infection fighting system (immune system) to block inflammation or infection. One side effect can be a rise in blood glucose. Most people can produce enough extra insulin to allow for this rise, but for those who cannot, steroids make blood glucose levels go even higher. It is not unusual for steroid treatments to "uncover" diabetes that  is developing. It is not always possible to determine if the hyperglycemia will go away after the steroids are stopped. A special blood test called an A1c is sometimes done to determine if your blood glucose was elevated before the steroids were started. SYMPTOMS  Thirsty.  Frequent urination.  Dry mouth.  Blurred vision.  Tired or fatigue.  Weakness.  Sleepy.  Tingling in feet or leg. DIAGNOSIS  Diagnosis is made by monitoring blood glucose in one or all of the following ways:  A1c test. This is a chemical Pugh in your blood.  Fingerstick blood glucose monitoring.  Laboratory results. TREATMENT  First, knowing the cause of the hyperglycemia is important before the hyperglycemia can be treated. Treatment may include, but is not be limited to:  Education.  Change or adjustment in medications.  Change or adjustment in meal plan.  Treatment for an illness, infection, etc.  More frequent blood glucose monitoring.  Change in exercise plan.  Decreasing or stopping steroids.  Lifestyle changes. HOME CARE INSTRUCTIONS   Test your blood glucose as directed.  Exercise regularly. Your caregiver will give you instructions about exercise. Pre-diabetes or diabetes which comes on with stress is helped by exercising.  Eat wholesome, balanced meals. Eat often and at regular, fixed times. Your caregiver or nutritionist will give you a meal plan to guide your sugar intake.  Being at an ideal weight is important. If needed, losing as little as 10 to 15 pounds may help improve blood glucose levels. SEEK MEDICAL CARE IF:   You have questions about medicine, activity, or diet.  You continue to have symptoms (problems such as increased thirst, urination, or weight gain). SEEK IMMEDIATE MEDICAL CARE IF:   You are vomiting or have diarrhea.  Your breath smells fruity.  You are breathing faster or slower.  You are very sleepy or incoherent.  You have numbness, tingling, or  pain in your feet or hands.  You have chest pain.  Your symptoms get worse even though you have been following your caregiver's orders.  If you have any other questions or concerns.   This information is not intended to replace advice given to you by your health care provider. Make sure you discuss any questions you have with your health care provider.   Document Released: 11/03/2000 Document Revised: 08/02/2011 Document Reviewed: 01/14/2015 Elsevier Interactive Patient Education 2016 [place] for Gastroesophageal Reflux Disease, Adult When you have gastroesophageal reflux disease (GERD), the foods you eat and your eating habits are very important. Choosing the right foods can help ease the discomfort of GERD. WHAT GENERAL GUIDELINES DO I NEED TO FOLLOW?  Choose fruits, vegetables, whole grains, low-fat dairy products, and low-fat meat, fish, and poultry.  Limit fats such as oils, salad dressings, butter, nuts, and avocado.  Keep a food diary to identify foods that cause symptoms.  Avoid foods that cause reflux. These may be different for different people.  Eat frequent small meals instead of three large meals each day.  Eat your meals slowly, in a relaxed setting.  Limit fried foods.  Cook foods using methods other than frying.  Avoid drinking alcohol.  Avoid drinking large amounts of liquids with your meals.  Avoid bending over or lying down until 2-3 hours after eating. WHAT FOODS ARE NOT RECOMMENDED? The following are some foods and drinks that may worsen your symptoms: Vegetables Tomatoes. Tomato juice. Tomato and spaghetti sauce. Chili peppers. Onion and garlic. Horseradish. Fruits Oranges, grapefruit, and lemon (fruit and juice). Meats High-fat meats, fish, and poultry. This includes hot dogs, ribs, ham, sausage, salami, and bacon. Dairy Whole milk and chocolate milk. Sour cream. Cream. Butter. Ice cream. Cream cheese.  Beverages Coffee  and tea, with or without caffeine. Carbonated beverages or energy drinks. Condiments Hot sauce. Barbecue sauce.  Sweets/Desserts Chocolate and cocoa. Donuts. Peppermint and spearmint. Fats and Oils High-fat foods, including [place] fries and potato chips. Other Vinegar. Strong spices, such as black pepper, white pepper, red pepper, cayenne, curry powder, cloves, ginger, and chili powder. The items listed above may not be a complete list of foods and beverages to avoid. Contact your dietitian for more information.   This information is not intended to replace advice given to you by your health care provider. Make sure you discuss any questions you have with your health care provider.   Document Released: 05/10/2005 Document Revised: 05/31/2014 Document Reviewed: 03/14/2013 Elsevier Interactive Patient  Education [employer].     I personally performed the services described in this documentation, which was scribed in my presence. The recorded information has been reviewed and considered, and addended by me as needed.    By signing my name below, I, Nadim Abuhashem, attest that this documentation has been prepared under the direction and in the presence of [name], MD.  Electronically Signed: Lora Havens, medical scribe. 05/10/2015, 4:12 PM.

## 2015-05-10 NOTE — Patient Instructions (Addendum)
As discussed last visit, it is very important you follow up with your endocrinologist.  Your diabetes is out of control today and much worse last visit. Increase the Lantus by 2 units every 3 days until your  readings are under 200,  Then stay at that dose.. Follow-up with me in 2 weeks with your home blood sugar readings if you have not been able to see  Your endocrinologist by that point in time.  if any nausea or vomiting, abdominal pain, blurry vision, or any new or worsening symptoms, return to the clinic or the emergency room  For heartburn, avoid the foods as listed below that are known to trigger heartburn. If only mild symptoms, can try Zantac or Pepcid in place of the prescription acid blocker. However if needed you can take the pantoprazole.   I will refer you to a cardiologist as your EKG does not appear normal,  But I do not have one to compare to today. If you have any chest pain, tightness, shortness of breath  - be seen in the emergency room immediately or call 911.  I would recommend against use of the Adderall for now until you have been seen and cleared by cardiology to do so.  Return to the clinic or go to the nearest emergency room if any of your symptoms worsen or new symptoms occur.  Hyperglycemia Hyperglycemia occurs when the glucose (sugar) in your blood is too high. Hyperglycemia can happen for many reasons, but it most often happens to people who do not know they have diabetes or are not managing their diabetes properly.  CAUSES  Whether you have diabetes or not, there are other causes of hyperglycemia. Hyperglycemia can occur when you have diabetes, but it can also occur in other situations that you might not be as aware of, such as: Diabetes  If you have diabetes and are having problems controlling your blood glucose, hyperglycemia could occur because of some of the following reasons:  Not following your meal plan.  Not taking your diabetes medications or not taking  it properly.  Exercising less or doing less activity than you normally do.  Being sick. Pre-diabetes  This cannot be ignored. Before people develop Type 2 diabetes, they almost always have "pre-diabetes." This is when your blood glucose levels are higher than normal, but not yet high enough to be diagnosed as diabetes. Research has shown that some long-term damage to the body, especially the heart and circulatory system, may already be occurring during pre-diabetes. If you take action to manage your blood glucose when you have pre-diabetes, you may delay or prevent Type 2 diabetes from developing. Stress  If you have diabetes, you may be "diet" controlled or on oral medications or insulin to control your diabetes. However, you may find that your blood glucose is higher than usual in the hospital whether you have diabetes or not. This is often referred to as "stress hyperglycemia." Stress can elevate your blood glucose. This happens because of hormones put out by the body during times of stress. If stress has been the cause of your high blood glucose, it can be followed regularly by your caregiver. That way he/she can make sure your hyperglycemia does not continue to get worse or progress to diabetes. Steroids  Steroids are medications that act on the infection fighting system (immune system) to block inflammation or infection. One side effect can be a rise in blood glucose. Most people can produce enough extra insulin to allow for  this rise, but for those who cannot, steroids make blood glucose levels go even higher. It is not unusual for steroid treatments to "uncover" diabetes that is developing. It is not always possible to determine if the hyperglycemia will go away after the steroids are stopped. A special blood test called an A1c is sometimes done to determine if your blood glucose was elevated before the steroids were started. SYMPTOMS  Thirsty.  Frequent urination.  Dry mouth.  Blurred  vision.  Tired or fatigue.  Weakness.  Sleepy.  Tingling in feet or leg. DIAGNOSIS  Diagnosis is made by monitoring blood glucose in one or all of the following ways:  A1c test. This is a chemical found in your blood.  Fingerstick blood glucose monitoring.  Laboratory results. TREATMENT  First, knowing the cause of the hyperglycemia is important before the hyperglycemia can be treated. Treatment may include, but is not be limited to:  Education.  Change or adjustment in medications.  Change or adjustment in meal plan.  Treatment for an illness, infection, etc.  More frequent blood glucose monitoring.  Change in exercise plan.  Decreasing or stopping steroids.  Lifestyle changes. HOME CARE INSTRUCTIONS   Test your blood glucose as directed.  Exercise regularly. Your caregiver will give you instructions about exercise. Pre-diabetes or diabetes which comes on with stress is helped by exercising.  Eat wholesome, balanced meals. Eat often and at regular, fixed times. Your caregiver or nutritionist will give you a meal plan to guide your sugar intake.  Being at an ideal weight is important. If needed, losing as little as 10 to 15 pounds may help improve blood glucose levels. SEEK MEDICAL CARE IF:   You have questions about medicine, activity, or diet.  You continue to have symptoms (problems such as increased thirst, urination, or weight gain). SEEK IMMEDIATE MEDICAL CARE IF:   You are vomiting or have diarrhea.  Your breath smells fruity.  You are breathing faster or slower.  You are very sleepy or incoherent.  You have numbness, tingling, or pain in your feet or hands.  You have chest pain.  Your symptoms get worse even though you have been following your caregiver's orders.  If you have any other questions or concerns.   This information is not intended to replace advice given to you by your health care provider. Make sure you discuss any questions you  have with your health care provider.   Document Released: 11/03/2000 Document Revised: 08/02/2011 Document Reviewed: 01/14/2015 Elsevier Interactive Patient Education 2016 [place] for Gastroesophageal Reflux Disease, Adult When you have gastroesophageal reflux disease (GERD), the foods you eat and your eating habits are very important. Choosing the right foods can help ease the discomfort of GERD. WHAT GENERAL GUIDELINES DO I NEED TO FOLLOW?  Choose fruits, vegetables, whole grains, low-fat dairy products, and low-fat meat, fish, and poultry.  Limit fats such as oils, salad dressings, butter, nuts, and avocado.  Keep a food diary to identify foods that cause symptoms.  Avoid foods that cause reflux. These may be different for different people.  Eat frequent small meals instead of three large meals each day.  Eat your meals slowly, in a relaxed setting.  Limit fried foods.  Cook foods using methods other than frying.  Avoid drinking alcohol.  Avoid drinking large amounts of liquids with your meals.  Avoid bending over or lying down until 2-3 hours after eating. WHAT FOODS ARE NOT RECOMMENDED? The following are some  foods and drinks that may worsen your symptoms: Vegetables Tomatoes. Tomato juice. Tomato and spaghetti sauce. Chili peppers. Onion and garlic. Horseradish. Fruits Oranges, grapefruit, and lemon (fruit and juice). Meats High-fat meats, fish, and poultry. This includes hot dogs, ribs, ham, sausage, salami, and bacon. Dairy Whole milk and chocolate milk. Sour cream. Cream. Butter. Ice cream. Cream cheese.  Beverages Coffee and tea, with or without caffeine. Carbonated beverages or energy drinks. Condiments Hot sauce. Barbecue sauce.  Sweets/Desserts Chocolate and cocoa. Donuts. Peppermint and spearmint. Fats and Oils High-fat foods, including [place] fries and potato chips. Other Vinegar. Strong spices, such as black pepper, white pepper,  red pepper, cayenne, curry powder, cloves, ginger, and chili powder. The items listed above may not be a complete list of foods and beverages to avoid. Contact your dietitian for more information.   This information is not intended to replace advice given to you by your health care provider. Make sure you discuss any questions you have with your health care provider.   Document Released: 05/10/2005 Document Revised: 05/31/2014 Document Reviewed: 03/14/2013 Elsevier Interactive Patient Education [employer].

## 2015-05-11 ENCOUNTER — Other Ambulatory Visit: Payer: Self-pay | Admitting: Internal Medicine

## 2015-05-11 LAB — COMPLETE METABOLIC PANEL WITH GFR
ALT: 31 U/L (ref 9–46)
AST: 26 U/L (ref 10–35)
Albumin: 3.8 g/dL (ref 3.6–5.1)
Alkaline Phosphatase: 90 U/L (ref 40–115)
BILIRUBIN TOTAL: 0.3 mg/dL (ref 0.2–1.2)
BUN: 16 mg/dL (ref 7–25)
CHLORIDE: 96 mmol/L — AB (ref 98–110)
CO2: 28 mmol/L (ref 20–31)
Calcium: 8.9 mg/dL (ref 8.6–10.3)
Creat: 0.94 mg/dL (ref 0.70–1.33)
Glucose, Bld: 308 mg/dL — ABNORMAL HIGH (ref 65–99)
Potassium: 3.8 mmol/L (ref 3.5–5.3)
Sodium: 137 mmol/L (ref 135–146)
TOTAL PROTEIN: 6.9 g/dL (ref 6.1–8.1)

## 2015-05-13 ENCOUNTER — Ambulatory Visit (INDEPENDENT_AMBULATORY_CARE_PROVIDER_SITE_OTHER): Payer: BLUE CROSS/BLUE SHIELD | Admitting: Internal Medicine

## 2015-05-13 ENCOUNTER — Telehealth: Payer: Self-pay

## 2015-05-13 ENCOUNTER — Encounter: Payer: Self-pay | Admitting: Internal Medicine

## 2015-05-13 VITALS — BP 114/70 | HR 80 | Temp 97.6°F | Resp 12 | Wt 216.0 lb

## 2015-05-13 DIAGNOSIS — E1151 Type 2 diabetes mellitus with diabetic peripheral angiopathy without gangrene: Secondary | ICD-10-CM | POA: Diagnosis not present

## 2015-05-13 DIAGNOSIS — IMO0002 Reserved for concepts with insufficient information to code with codable children: Secondary | ICD-10-CM

## 2015-05-13 DIAGNOSIS — E1165 Type 2 diabetes mellitus with hyperglycemia: Secondary | ICD-10-CM | POA: Diagnosis not present

## 2015-05-13 MED ORDER — INSULIN GLARGINE 100 UNIT/ML SOLOSTAR PEN: PEN_INJECTOR | SUBCUTANEOUS | Status: DC

## 2015-05-13 MED ORDER — INSULIN LISPRO 100 UNIT/ML (KWIKPEN): 8.0000 [IU] | PEN_INJECTOR | Freq: Three times a day (TID) | SUBCUTANEOUS | Status: DC

## 2015-05-13 NOTE — Progress Notes (Signed)
Subjective:     Patient ID: Raymond Pugh, male   DOB: [birth year], 54 y.o.   MRN: [id number]  HPI Raymond Pugh is a pleasant 54 y.o. man,54 y.o. man,returning for f/u for management of DM2, dx 2008, uncontrolled, insulin-dependent, with complications (peripheral neuropathy). Last visit 11 mo ago. Previous appt was 1.5 years after the prior one!  PCP: Dr [name]  Not compliant with appts and recommended tx's. Not checking sugars.  Last A hemoglobin A1c: Lab Results  Component Value Date   HGBA1C 11.2 05/10/2015   HGBA1C 8.7* 11/21/2014   HGBA1C 11.3 03/19/2014   He is now on: - Metformin 1000 mg bid - Glipizide 10 mg bid - Lantus 20 >> 22 >> 24 units in at bedtime At last visit, I suggested to add mealtime Humalog and stop Glipizide. He did not do this, and continued Glipizide. Subsequently, HbA1c this month is very high. No lows. He has hypoglycemia awareness at <90.  He only checks his sugars in last 4 days: - a.m.:190-250 >> 94-148 >> 232-300 - before lunch: 120 >> 269, 334 - 2h after lunch: 289, 300 - before dinner: 217 >> 249, 390 - after dinner: 233-275 >> 284-405 Higher: 405  He quit drinking sodas.  - No kidney dysfunction.  Last BUN/Cr: Lab Results  Component Value Date   BUN 16 05/10/2015   Lab Results  Component Value Date   CREATININE 0.94 05/10/2015  On Ramipril.  - last lipids: Lab Results  Component Value Date   CHOL 156 12/25/2014   HDL 41 12/25/2014   LDLCALC 72 12/25/2014   TRIG 213* 12/25/2014   CHOLHDL 3.8 12/25/2014  On Lovastatin. - Last eye exam: 12/2014, [facility]. He has had retinal detachment surgery.   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  Review of Systems Constitutional: no weight gain, no fatigue, no subjective hyperthermia/hypothermia, increased thirst Eyes: no blurry vision, no xerophthalmia ENT: no sore throat, no nodules palpated in throat, no dysphagia/odynophagia, no hoarseness Cardiovascular: no  CP/SOB/palpitations/leg swelling Respiratory: no cough/SOB Gastrointestinal: no N/V/D/C Musculoskeletal: no muscle/joint aches Skin: no rashes Neurological: no tremors/numbness/tingling/dizziness  I reviewed pt's medications, allergies, PMH, social hx, family hx, and changes were documented in the history of present illness. Otherwise, unchanged from my initial visit note.  Objective:   Physical Exam BP 114/70 mmHg  Pulse 80  Temp(Src) 97.6 F (36.4 C) (Oral)  Resp 12  Wt 216 lb (97.977 kg)  SpO2 96% Body mass index is 32.85 kg/(m^2). Wt Readings from Last 3 Encounters:  05/13/15 216 lb (97.977 kg)  05/10/15 216 lb (97.977 kg)  11/21/14 216 lb 12.8 oz (98.34 kg)  Constitutional: overweight, in NAD Eyes: PERRLA, , anisocoria - surgical pupil on the left, EOMI, no exophthalmos ENT: moist mucous membranes, no thyromegaly, no cervical lymphadenopathy Cardiovascular: RRR, No MRG Respiratory: CTA B Gastrointestinal: abdomen soft, NT, ND, BS+ Musculoskeletal: no deformities, strength intact in all 4 Skin: moist, warm, no rashes Neurological: no tremor with outstretched hands, DTR normal in all 4   Assessment:     1.  DM2, uncontrolled, insulin-dependent, with complications - peripheral neuropathy  Plan:     1. Pt with long-standing, uncontrolled, diabetes, returning after an absence of 1 year. He only brings a sugar log for last 4 days >> very high. He added Humalog as advised at last visit >> HbA1c decreased to 8.7% in 10/2014, but he stopped it afterwards and restarted Glipizide, with subsequent increase in HbA1c to 11.2%. I had a  long discussion with him and advised him that if he does not follow the recommended regimen, does not check sugars, and does not come to his appts >> I cannot see him anymore. He promises me that he will start following my advice as he is determined to gain control of his DM> - will again add mealtime insulin, increase Lantus and stop Glipizide.  Patient  Instructions   Please stop Glipizide.  Continue Metformin 1000 mg 2x a day with meals.  Increase Lantus to 30 units (tonight inject 26 units).  Start HUMALOG mealtime insulin - inject this 15 min before every meal: - 8 units before a smaller meal - 10 units before a larger meal - 12 units before a very large meal or if you have dessert  Please let me know if sugars >200 or <80 consistently.  Please return in 1.5 month with your sugar log.   - UTD with eye exams - RTC in 1.5 month with his sugar log

## 2015-05-13 NOTE — Telephone Encounter (Signed)
PA completed for generic adderall xr 20 mg QD. Approved through 05/12/18, case # [id number]. Notified pharm.

## 2015-05-13 NOTE — Patient Instructions (Signed)
  Please stop Glipizide.  Continue Metformin 1000 mg 2x a day with meals.  Increase Lantus to 30 units (tonight inject 26 units).  Start HUMALOG mealtime insulin - inject this 15 min before every meal: - 8 units before a smaller meal - 10 units before a larger meal - 12 units before a very large meal or if you have dessert  Please let me know if sugars >200 or <80 consistently.  Please return in 1.5 month with your sugar log.

## 2015-05-20 ENCOUNTER — Other Ambulatory Visit: Payer: Self-pay | Admitting: Internal Medicine

## 2015-05-21 ENCOUNTER — Other Ambulatory Visit: Payer: Self-pay | Admitting: Physician Assistant

## 2015-05-21 ENCOUNTER — Telehealth: Payer: Self-pay | Admitting: Internal Medicine

## 2015-05-21 NOTE — Telephone Encounter (Signed)
Pt's wife called again regarding the Insulin , states her husband need the pen also and his Diabetic Dr is out of the office until next week, please call [place]

## 2015-05-21 NOTE — Telephone Encounter (Signed)
Patient wife stated that patient have been out of his medication for three days, she called three days ago for a refill and no one has responded, so she said it is urgent that you send a refill of his Insulin Glargine (LANTUS SOLOSTAR) 100 UNIT/ML Solostar to [facility].

## 2015-05-21 NOTE — Telephone Encounter (Signed)
rx's were sent in on 12/20 and 12/28, patient is aware.

## 2015-05-22 ENCOUNTER — Telehealth: Payer: Self-pay | Admitting: Internal Medicine

## 2015-05-22 ENCOUNTER — Emergency Department (HOSPITAL_COMMUNITY): Payer: BLUE CROSS/BLUE SHIELD

## 2015-05-22 ENCOUNTER — Encounter (HOSPITAL_COMMUNITY): Payer: Self-pay | Admitting: *Deleted

## 2015-05-22 ENCOUNTER — Emergency Department (HOSPITAL_COMMUNITY)
Admission: EM | Admit: 2015-05-22 | Discharge: 2015-05-22 | Disposition: A | Discharge status: Home / Self Care | Payer: BLUE CROSS/BLUE SHIELD | Attending: Emergency Medicine | Admitting: Emergency Medicine

## 2015-05-22 DIAGNOSIS — Z79899 Other long term (current) drug therapy: Secondary | ICD-10-CM | POA: Insufficient documentation

## 2015-05-22 DIAGNOSIS — Z794 Long term (current) use of insulin: Secondary | ICD-10-CM | POA: Insufficient documentation

## 2015-05-22 DIAGNOSIS — E1165 Type 2 diabetes mellitus with hyperglycemia: Secondary | ICD-10-CM | POA: Insufficient documentation

## 2015-05-22 DIAGNOSIS — Z7984 Long term (current) use of oral hypoglycemic drugs: Secondary | ICD-10-CM | POA: Diagnosis not present

## 2015-05-22 DIAGNOSIS — K2270 Barrett's esophagus without dysplasia: Secondary | ICD-10-CM | POA: Insufficient documentation

## 2015-05-22 DIAGNOSIS — R739 Hyperglycemia, unspecified: Secondary | ICD-10-CM

## 2015-05-22 DIAGNOSIS — F909 Attention-deficit hyperactivity disorder, unspecified type: Secondary | ICD-10-CM | POA: Diagnosis not present

## 2015-05-22 DIAGNOSIS — R05 Cough: Secondary | ICD-10-CM | POA: Diagnosis not present

## 2015-05-22 DIAGNOSIS — I10 Essential (primary) hypertension: Secondary | ICD-10-CM | POA: Diagnosis not present

## 2015-05-22 DIAGNOSIS — Z862 Personal history of diseases of the blood and blood-forming organs and certain disorders involving the immune mechanism: Secondary | ICD-10-CM | POA: Insufficient documentation

## 2015-05-22 DIAGNOSIS — E785 Hyperlipidemia, unspecified: Secondary | ICD-10-CM | POA: Insufficient documentation

## 2015-05-22 LAB — BASIC METABOLIC PANEL
ANION GAP: 14 (ref 5–15)
BUN: 13 mg/dL (ref 6–20)
CALCIUM: 9.1 mg/dL (ref 8.9–10.3)
CO2: 25 mmol/L (ref 22–32)
CREATININE: 0.91 mg/dL (ref 0.61–1.24)
Chloride: 100 mmol/L — ABNORMAL LOW (ref 101–111)
GFR calc Af Amer: >60 mL/min (ref >60)
Glucose, Bld: 287 mg/dL — ABNORMAL HIGH (ref 65–99)
Potassium: 3.9 mmol/L (ref 3.5–5.1)
Sodium: 139 mmol/L (ref 135–145)

## 2015-05-22 LAB — CBC
HCT: 44 % (ref 39.0–52.0)
HEMOGLOBIN: 15.1 g/dL (ref 13.0–17.0)
MCH: 30.3 pg (ref 26.0–34.0)
MCHC: 34.3 g/dL (ref 30.0–36.0)
MCV: 88.4 fL (ref 78.0–100.0)
PLATELETS: 216 10*3/uL (ref 150–400)
RBC: 4.98 MIL/uL (ref 4.22–5.81)
RDW: 13.8 % (ref 11.5–15.5)
WBC: 8.2 10*3/uL (ref 4.0–10.5)

## 2015-05-22 LAB — URINE MICROSCOPIC-ADD ON
BACTERIA UA: NONE SEEN
RBC / HPF: NONE SEEN RBC/hpf (ref 0–5)
Squamous Epithelial / LPF: NONE SEEN

## 2015-05-22 LAB — CBG MONITORING, ED
GLUCOSE-CAPILLARY: 345 mg/dL — AB (ref 65–99)
Glucose-Capillary: 289 mg/dL — ABNORMAL HIGH (ref 65–99)
Glucose-Capillary: 249 mg/dL — ABNORMAL HIGH (ref 65–99)

## 2015-05-22 LAB — URINALYSIS, ROUTINE W REFLEX MICROSCOPIC
Bilirubin Urine: NEGATIVE
Glucose, UA: >1000 mg/dL — AB
HGB URINE DIPSTICK: NEGATIVE
Ketones, ur: NEGATIVE mg/dL
LEUKOCYTES UA: NEGATIVE
NITRITE: NEGATIVE
PROTEIN: NEGATIVE mg/dL
SPECIFIC GRAVITY, URINE: 1.043 — AB (ref 1.005–1.030)
pH: 5 (ref 5.0–8.0)

## 2015-05-22 LAB — HEPATIC FUNCTION PANEL
ALBUMIN: 3.4 g/dL — AB (ref 3.5–5.0)
ALT: 56 U/L (ref 17–63)
AST: 53 U/L — AB (ref 15–41)
Alkaline Phosphatase: 89 U/L (ref 38–126)
BILIRUBIN DIRECT: 0.2 mg/dL (ref 0.1–0.5)
Indirect Bilirubin: 0.5 mg/dL (ref 0.3–0.9)
TOTAL PROTEIN: 6.5 g/dL (ref 6.5–8.1)
Total Bilirubin: 0.7 mg/dL (ref 0.3–1.2)

## 2015-05-22 MED ORDER — INSULIN ASPART 100 UNIT/ML ~~LOC~~ SOLN
10.0000 [IU] | Freq: Once | SUBCUTANEOUS | Status: AC
  Administered 2015-05-22: 10 [IU] via SUBCUTANEOUS
  Filled 2015-05-22: qty 1

## 2015-05-22 MED ORDER — SODIUM CHLORIDE 0.9 % IV BOLUS (SEPSIS)
1000.0000 mL | Freq: Once | INTRAVENOUS | Status: AC
Start: 2015-05-22 — End: 2015-05-22
  Administered 2015-05-22: 1000 mL via INTRAVENOUS

## 2015-05-22 NOTE — Telephone Encounter (Signed)
Be advised of message below.

## 2015-05-22 NOTE — Discharge Instructions (Signed)
Increase your lantus to 35 U at night.   Continue humalog as prescribed by your doctor.   Continue metformin.   See your doctor next week  Return to ER if you have blood sugar > 600, or less < 60, vomiting, fever, abdominal pain.

## 2015-05-22 NOTE — Telephone Encounter (Signed)
Patient wife stated that patient is on his way to the ER his b/s have been spiking up to 585. Just a [place]

## 2015-05-22 NOTE — ED Provider Notes (Signed)
CSN: [id number]     Arrival date & time 05/22/15  1403 History   First MD Initiated Contact with Patient 05/22/15 2017     Chief Complaint  Patient presents with  . Hyperglycemia     (Consider location/radiation/quality/duration/timing/severity/associated sxs/prior Treatment) The history is provided by the patient.  Raymond Pugh is a 54 y.o. male hx of HTN, HL, DM, here with hyperglycemia. Patient states that it his blood sugar has not been very well-controlled the last several weeks. Patient is taken off of glipizide and was put on Humalog and Lantus. Patient states that his blood sugar is still running high. His blood sugar is usually between 200-500. He states that he has not been eating more carbs usual. Denies any fever or vomiting or abdominal pain. Patient saw his doctor again recently and increased the Lantus to 30 units from 26. Has been feeling tired, low grade temp at home.    Past Medical History  Diagnosis Date  . Hypertension   . Hyperlipemia   . Diabetes mellitus   . Barrett's esophagus   . Hiatal hernia   . ADHD (attention deficit hyperactivity disorder)   . Anemia   . Myocarditis ([place])     h/o mypocarditis, caused cardiomyopathy-- resolved    Past Surgical History  Procedure Laterality Date  . Hydrocele excision / repair    . Retinal detachment surgery    . Radiokeratotomy    . Eye surgery    . Abscess drain liver perc ([facility] hx)      klebsiella on liver pa states    Family History  Problem Relation Age of Onset  . Diabetes Father   . Hypertension Father   . Colon polyps Father   . Hypertension Mother   . Asthma Mother   . Prostate cancer Neg Hx    Social History  Substance Use Topics  . Smoking status: Never Smoker   . Smokeless tobacco: Never Used  . Alcohol Use: 0.6 oz/week    1 Cans of beer per week     Comment: socially     Review of Systems  Respiratory: Positive for cough.   All other systems reviewed and are  negative.     Allergies  Review of patient's allergies indicates no known allergies.  Home Medications   Prior to Admission medications   Medication Sig Start Date End Date Taking? Authorizing Provider  ALPRAZolam Duanne Moron) 1 MG tablet Take 0.5-1 tablets (0.5-1 mg total) by mouth daily as needed (for anxiety). 05/10/15  Yes Wendie Agreste, MD  amphetamine-dextroamphetamine (ADDERALL XR) 20 MG 24 hr capsule Take 1 Capsules by mouth every day prn Patient taking differently: Take 20 mg by mouth daily as needed (ADD). Take 1 Capsules by mouth every day prn 05/10/15  Yes Wendie Agreste, MD  hydrochlorothiazide (HYDRODIURIL) 25 MG tablet TAKE 1 TABLET (25 MG) BY MOUTH DAILY 05/10/15  Yes Wendie Agreste, MD  Insulin Glargine (LANTUS SOLOSTAR) 100 UNIT/ML Solostar Pen INJECT 30 UNITS UNDER THE SKIN EVERY NIGHT AT BEDTIME 05/13/15  Yes [name], MD  insulin lispro (HUMALOG KWIKPEN) 100 UNIT/ML KiwkPen Inject 0.08-0.12 mLs (8-12 Units total) into the skin 3 (three) times daily. Patient taking differently: Inject 8-12 Units into the skin 3 (three) times daily. 8 units with small meals, 10 units with large meals, 12 units with very large meal 05/13/15  Yes [name], MD  lovastatin (MEVACOR) 20 MG tablet TAKE 1 TABLET BY MOUTH EVERY NIGHT AT BEDTIME 05/10/15  Yes Wendie Agreste, MD  metFORMIN (GLUCOPHAGE) 1000 MG tablet TAKE 1 TABLET BY MOUTH TWICE DAILY 05/10/15  Yes Wendie Agreste, MD  metoprolol (LOPRESSOR) 100 MG tablet TAKE 1 TABLET(100 MG) BY MOUTH TWICE DAILY 05/10/15  Yes Wendie Agreste, MD  pantoprazole (PROTONIX) 40 MG tablet TAKE 1 TABLET BY MOUTH EVERY MORNING BEFORE BREAKFAST 05/10/15  Yes Wendie Agreste, MD  ramipril (ALTACE) 10 MG capsule TAKE 1 CAPSULE (10 MG) BY MOUTH TWICE DAILY 05/10/15  Yes Wendie Agreste, MD  Insulin Glargine (LANTUS SOLOSTAR) 100 UNIT/ML Solostar Pen Inject 36 Units into the skin daily at 10 pm. Patient not taking: Reported on  05/22/2015 05/20/15   [name], MD   BP 148/84 mmHg  Pulse 82  Temp(Src) 97.8 F (36.6 C) (Oral)  Resp 19  Ht 5\' 10"  (1.778 m)  Wt 212 lb (96.163 kg)  BMI 30.42 kg/m2  SpO2 95% Physical Exam  Constitutional: He is oriented to person, place, and time. He appears well-developed and well-nourished.  HENT:  Head: Normocephalic.  Mouth/Throat: Oropharynx is clear and moist.  Eyes: Conjunctivae are normal. Pupils are equal, round, and reactive to light.  Neck: Normal range of motion. Neck supple.  Cardiovascular: Normal rate, regular rhythm and normal heart sounds.   Pulmonary/Chest: Effort normal and breath sounds normal. No respiratory distress. He has no wheezes. He has no rales.  Abdominal: Soft. Bowel sounds are normal. He exhibits no distension. There is no tenderness. There is no rebound.  Musculoskeletal: Normal range of motion. He exhibits no edema or tenderness.  Neurological: He is alert and oriented to person, place, and time. No cranial nerve deficit. Coordination normal.  Skin: Skin is warm and dry.  Psychiatric: He has a normal mood and affect. His behavior is normal. Judgment and thought content normal.  Nursing note and vitals reviewed.   ED Course  Procedures (including critical care time) Labs Review Labs Reviewed  BASIC METABOLIC PANEL - Abnormal; Notable for the following:    Chloride 100 (*)    Glucose, Bld 287 (*)    All other components within normal limits  URINALYSIS, ROUTINE W REFLEX MICROSCOPIC (NOT AT [facility]) - Abnormal; Notable for the following:    Specific Gravity, Urine 1.043 (*)    Glucose, UA >1000 (*)    All other components within normal limits  HEPATIC FUNCTION PANEL - Abnormal; Notable for the following:    Albumin 3.4 (*)    AST 53 (*)    All other components within normal limits  CBG MONITORING, ED - Abnormal; Notable for the following:    Glucose-Capillary 289 (*)    All other components within normal limits  CBG MONITORING, ED  - Abnormal; Notable for the following:    Glucose-Capillary 345 (*)    All other components within normal limits  CBG MONITORING, ED - Abnormal; Notable for the following:    Glucose-Capillary 249 (*)    All other components within normal limits  CBC  URINE MICROSCOPIC-ADD ON    Imaging Review Dg Chest 2 View  05/22/2015  CLINICAL DATA:  Recent hyperglycemia. Recent cough/cold symptoms with low-grade fever and fatigue. EXAM: CHEST  2 VIEW COMPARISON:  05/13/2014 FINDINGS: Lungs are adequately inflated without focal consolidation or effusion. Cardiomediastinal silhouette is within normal. There are degenerative changes of the spine. IMPRESSION: No active cardiopulmonary disease. Electronically Signed   By: Marin Olp M.D.   On: 05/22/2015 15:13   I have personally reviewed and evaluated these  images and lab results as part of my medical decision-making.   EKG Interpretation   Date/Time:  Thursday May 22 2015 21:07:25 EST Ventricular Rate:  84 PR Interval:  169 QRS Duration: 109 QT Interval:  394 QTC Calculation: 466 R Axis:   -70 Text Interpretation:  Sinus rhythm Ventricular premature complex Left  anterior fascicular block Abnormal R-wave progression, late transition No  significant change since last tracing Confirmed by YAO  MD, DAVID ([id number])  on 05/22/2015 10:47:21 PM      MDM   Final diagnoses:  None   DEIDRICK FORTES is a 54 y.o. male here with hyperglycemia. No vomiting or abdominal pain or fever to suggest DKA. Likely uncontrolled diabetes. Will check CBG, labs, will hydrate and will likely need to adjust insulin dosage.   10:48 PM CBG was 350. Glucose was 287 on BMP, mild gap 14 likely from dehydration. No ketones in urine so not in DKA. CBG dec to 249 after 1 L NS and insulin. Recommend increase lantus to 35 U at night and continue humalog. Will have him f/u with PCP next week.    Wandra Arthurs, MD 05/22/15 [phone]

## 2015-05-22 NOTE — ED Notes (Signed)
Pt reports recent hyperglycemia, has been to pcp and had meds adjusted. Only reports recent cough/cold symptoms, low grade fever and fatigue.

## 2015-05-22 NOTE — ED Notes (Signed)
Spoke with main lab - they will add on hepatic function panel. 

## 2015-05-23 ENCOUNTER — Other Ambulatory Visit: Payer: Self-pay | Admitting: Family Medicine

## 2015-05-23 ENCOUNTER — Other Ambulatory Visit: Payer: Self-pay | Admitting: Physician Assistant

## 2015-05-26 NOTE — Telephone Encounter (Signed)
Raymond Pugh, Reviewed ED notes. Please check on him. Please ask him to check sugars before meals and write them down. Also, take insulin as Rx'ed. Call with sugars in 2-3 days.

## 2015-05-27 ENCOUNTER — Other Ambulatory Visit: Payer: Self-pay | Admitting: *Deleted

## 2015-05-27 MED ORDER — ONETOUCH VERIO FLEX SYSTEM W/DEVICE KIT: 1.0000 | PACK | Freq: Every day | Status: DC

## 2015-05-27 MED ORDER — GLUCOSE BLOOD VI STRP: ORAL_STRIP | Status: DC

## 2015-05-27 MED ORDER — ONETOUCH DELICA LANCETS FINE MISC: Status: DC

## 2015-05-27 NOTE — Telephone Encounter (Signed)
Pt said he is doing better. His blood sugar this AM was 281 (fasting). Pt keeping an eye on his blood sugars. Will call back on Thurs and let us know what they are.

## 2015-05-27 NOTE — Telephone Encounter (Signed)
Pt needs new meter, strips and lancets sent to [facility]. [employer] ins.

## 2015-05-27 NOTE — Telephone Encounter (Signed)
Called pt and advised him per Dr Arman Filter message. Pt voiced understanding. Pt requested a new meter, strips and lancets. Pt said he lost his meter. Bought one from [place], strips are too expensive. Sent rx for meter, strips and lancets.

## 2015-06-07 ENCOUNTER — Other Ambulatory Visit: Payer: Self-pay | Admitting: Physician Assistant

## 2015-06-08 ENCOUNTER — Other Ambulatory Visit: Payer: Self-pay | Admitting: Internal Medicine

## 2015-06-09 ENCOUNTER — Telehealth: Payer: Self-pay | Admitting: Internal Medicine

## 2015-06-09 ENCOUNTER — Encounter: Payer: Self-pay | Admitting: Cardiology

## 2015-06-09 ENCOUNTER — Ambulatory Visit (INDEPENDENT_AMBULATORY_CARE_PROVIDER_SITE_OTHER): Payer: Managed Care, Other (non HMO) | Admitting: Cardiology

## 2015-06-09 VITALS — BP 132/80 | HR 100 | Ht 70.0 in | Wt 214.0 lb

## 2015-06-09 DIAGNOSIS — I10 Essential (primary) hypertension: Secondary | ICD-10-CM

## 2015-06-09 DIAGNOSIS — R9431 Abnormal electrocardiogram [ECG] [EKG]: Secondary | ICD-10-CM | POA: Diagnosis not present

## 2015-06-09 DIAGNOSIS — IMO0002 Reserved for concepts with insufficient information to code with codable children: Secondary | ICD-10-CM

## 2015-06-09 DIAGNOSIS — E785 Hyperlipidemia, unspecified: Secondary | ICD-10-CM | POA: Diagnosis not present

## 2015-06-09 DIAGNOSIS — E1151 Type 2 diabetes mellitus with diabetic peripheral angiopathy without gangrene: Secondary | ICD-10-CM | POA: Diagnosis not present

## 2015-06-09 DIAGNOSIS — I444 Left anterior fascicular block: Secondary | ICD-10-CM

## 2015-06-09 DIAGNOSIS — E1165 Type 2 diabetes mellitus with hyperglycemia: Secondary | ICD-10-CM

## 2015-06-09 NOTE — Patient Instructions (Addendum)
Medication Instructions:   CONTINUE SAME MEDICATION  AND START TAKING ASPIRIN 81 MG   If you need a refill on your cardiac medications before your next appointment, please call your pharmacy.  Labwork: NONE ORDER TODAY'   Testing/Procedures: Your physician has requested that you have an echocardiogram. Echocardiography is a painless test that uses sound waves to create images of your heart. It provides your doctor with information about the size and shape of your heart and how well your heart's chambers and valves are working. This procedure takes approximately one hour. There are no restrictions for this procedure.   Follow-Up: AS NEEDED FOR  ANY CARDIAC RELATED SYMPTOMS    Any Other Special Instructions Will Be Listed Below (If Applicable).

## 2015-06-09 NOTE — Telephone Encounter (Signed)
Patient Name: Raymond Pugh Gender: Male DOB: 16-Jan-1961 Age: 55 Y 3 M 27 D Return Phone Number: [phone] (Primary) Address: City/State/Zip: [place] Client [facility] Night - Client Client Site [facility] Physician [name] Contact Type Call Call Type Triage / Clinical Relationship To Patient Self Return Phone Number [phone] (Primary) Chief Complaint Prescription Refill or Medication Request (non symptomatic) Initial Comment Caller states he has pens for insulin but no needles for the pens. Could the caller get a script for the needles? Caller takes 4-6 shots a day and caller only has 2 needles left . [facility] @ [phone] Nurse Assessment Nurse: Ronnald Ramp, RN, Miranda Date/Time Eilene Ghazi Time): 06/08/2015 2:

## 2015-06-09 NOTE — Progress Notes (Addendum)
Cardiology Office Note    Date:  06/09/2015   ID:  Raymond Pugh, DOB [birth year], MRN [id number]  PCP:  No primary care provider on file.  Cardiologist:   Candee Furbish, MD     History of Present Illness:  Raymond Pugh is a 55 y.o. male with diabetes , anxiety here for the evaluation of his heart/ abnmoral ECG at the request of Dr. Larose Kells. ECG was reported as abnormal. LAFB, PAC. PRWP. Personally viewed.   Prior visit years ago with Dr. Lovena Le, EF was low at one point, cardiomyopathy. ?myocarditis. SOB was noted then. HTN was uncontrolled at the time.   Currently he denies any CP, no SOB, no syncope, no orthopnea.   His blood sugars have been difficult to control.    Past Medical History  Diagnosis Date  . Hypertension   . Hyperlipemia   . Diabetes mellitus   . Barrett's esophagus   . Hiatal hernia   . ADHD (attention deficit hyperactivity disorder)   . Anemia   . Myocarditis ([place])     h/o mypocarditis, caused cardiomyopathy-- resolved     Past Surgical History  Procedure Laterality Date  . Hydrocele excision / repair    . Retinal detachment surgery    . Radiokeratotomy    . Eye surgery    . Abscess drain liver perc ([facility] hx)      klebsiella on liver pa states     Outpatient Prescriptions Prior to Visit  Medication Sig Dispense Refill  . ALPRAZolam (XANAX) 1 MG tablet Take 0.5-1 tablets (0.5-1 mg total) by mouth daily as needed (for anxiety). 30 tablet 0  . amphetamine-dextroamphetamine (ADDERALL XR) 20 MG 24 hr capsule Take 1 Capsules by mouth every day prn 30 capsule 0  . Blood Glucose Monitoring Suppl ([place]) w/Device KIT 1 each by Does not apply route daily. Dx: E11.51 1 kit 0  . glucose blood (ONETOUCH VERIO) test strip Use to test blood sugar 4-6 times daily as instructed. Dx: E11.51 180 each 3  . hydrochlorothiazide (HYDRODIURIL) 25 MG tablet TAKE 1 TABLET(25 MG) BY MOUTH DAILY 90 tablet 0  . Insulin Glargine (LANTUS SOLOSTAR) 100  UNIT/ML Solostar Pen Inject 36 Units into the skin daily at 10 pm. 15 mL 2  . insulin lispro (HUMALOG KWIKPEN) 100 UNIT/ML KiwkPen Inject 0.08-0.12 mLs (8-12 Units total) into the skin 3 (three) times daily. 15 mL 2  . lovastatin (MEVACOR) 20 MG tablet TAKE ONE TABLET BY MOUTH EVERY NIGHT AT BEDTIME. NO MORE REFILLS WITHOUT OFFICE VISIT 30 tablet 2  . metFORMIN (GLUCOPHAGE) 1000 MG tablet TAKE ONE TABLET BY MOUTH TWICE DAILY. NO MORE REFILLS WITHOUTH OFFICE VISIT 60 tablet 2  . metoprolol (LOPRESSOR) 100 MG tablet TAKE 1 TABLET(100 MG) BY MOUTH TWICE DAILY 180 tablet 0  . ONETOUCH DELICA LANCETS FINE MISC Use to test blood sugar 4-6 times daily as instructed. Dx: E11.51 200 each 3  . pantoprazole (PROTONIX) 40 MG tablet TAKE 1 TABLET BY MOUTH EVERY MORNING BEFORE BREAKFAST 90 tablet 0  . ramipril (ALTACE) 10 MG capsule TAKE 1 CAPSULE (10 MG) BY MOUTH TWICE DAILY 180 capsule 0  . glipiZIDE (GLUCOTROL) 10 MG tablet TAKE 1 TABLET BY MOUTH TWICE DAILY BEFORE MEALS 30 tablet 2  . lovastatin (MEVACOR) 20 MG tablet TAKE 1 TABLET BY MOUTH EVERY NIGHT AT BEDTIME 90 tablet 0   No facility-administered medications prior to visit.     Allergies:   Review of patient's allergies  indicates no known allergies.   Social History   Social History  . Marital Status: Married    Spouse Name: N/A  . Number of Children: 2  . Years of Education: N/A   Occupational History  . student, part time job     [employer], Architect   Social History Main Topics  . Smoking status: Never Smoker   . Smokeless tobacco: Never Used  . Alcohol Use: 0.6 oz/week    1 Cans of beer per week     Comment: socially   . Drug Use: No  . Sexual Activity: Not Asked     Comment: not asked   Other Topics Concern  . None   Social History Narrative   2 children + 2 step children ---    Has a part time job, Ship broker    Exercise swimming     Family History:  The patient's family history includes Asthma in his mother; Colon polyps  in his father; Diabetes in his father; Hypertension in his father and mother. There is no history of Prostate cancer.   ROS:   Please see the history of present illness.    Review of Systems  Respiratory: Positive for snoring.   Musculoskeletal: Positive for back pain.   All other systems reviewed and are negative.   PHYSICAL EXAM:   VS:  BP 132/80 mmHg  Pulse 100  Ht '5\' 10"'  (1.778 m)  Wt 214 lb (97.07 kg)  BMI 30.71 kg/m2   GEN: Well nourished, well developed, in no acute distress HEENT: normal Neck: no JVD, carotid bruits, or masses Cardiac: RRR; no murmurs, rubs, or gallops,no edema  Respiratory:  clear to auscultation bilaterally, normal work of breathing GI: soft, nontender, nondistended, + BS, overweight MS: no deformity or atrophy Skin: warm and dry, no rash Neuro:  Alert and Oriented x 3, Strength and sensation are intact Psych: euthymic mood, full affect  Wt Readings from Last 3 Encounters:  06/09/15 214 lb (97.07 kg)  05/22/15 212 lb (96.163 kg)  05/13/15 216 lb (97.977 kg)      Studies/Labs Reviewed:   EKG:  2 EKGs from December 2016 were personally reviewed and demonstrates sinus rhythm with left anterior fascicular block, poor R-wave progression, PAC/PVC noted.  Recent Labs: 05/22/2015: ALT 56; BUN 13; Creatinine, Ser 0.91; Hemoglobin 15.1; Platelets 216; Potassium 3.9; Sodium 139   Lipid Panel    Component Value Date/Time   CHOL 156 12/25/2014 1142   TRIG 213* 12/25/2014 1142   TRIG 136 05/09/2006 1426   HDL 41 12/25/2014 1142   CHOLHDL 3.8 12/25/2014 1142   CHOLHDL 4.9 CALC 05/09/2006 1426   VLDL 43* 12/25/2014 1142   LDLCALC 72 12/25/2014 1142    Additional studies/ records that were reviewed today include:   CXR  December 2016 - normal    ASSESSMENT:    1. Abnormal EKG   2. Uncontrolled type 2 diabetes mellitus with peripheral angiopathy ([place])   3. Essential hypertension   4. Hyperlipidemia   5. LAFB (left anterior fascicular block)       PLAN:  In order of problems listed above:  1. EKG abnormality noted. We will check an echocardiogram to ensure proper structure and function of his heart. 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 Currently he is on excellent medication, beta blocker, ACE inhibitor. He will continue. He's not having any symptoms from his abnormal EKG currently.  2.  Diabetes under management by primary team. Medications reviewed. This is a coronary artery disease:. I'm glad he is on statin, ARB.I have also asked him to take aspirin 81 mg for prevention. 3. Good overall blood pressure control. Multiple medications reviewed. 4. Continue with statin medication. 5. Electrical disturbance on EKG, LAFB. We will monitor. No high risk symptoms such as syncope.    Medication Adjustments/Labs and Tests Ordered: Current medicines are reviewed at length with the patient today.  Concerns regarding medicines are outlined above.  Medication changes, Labs and Tests ordered today are listed in the Patient Instructions below. Patient Instructions  Medication Instructions:   CONTINUE SAME MEDICATION  AND START TAKING ASPIRIN 81 MG   If you need a refill on your cardiac medications before your next appointment, please call your pharmacy.  Labwork: NONE ORDER TODAY'   Testing/Procedures: Your physician has requested that you have an echocardiogram. Echocardiography is a painless test that uses sound waves to create images of your heart. It provides your doctor with information about the size and shape of your heart and how well your heart's chambers and valves are working. This procedure takes approximately one hour. There are no restrictions for this procedure.   Follow-Up: AS NEEDED FOR  ANY CARDIAC RELATED SYMPTOMS    Any Other Special Instructions Will Be Listed Below (If Applicable).                                                                                                                                                          Bobby Rumpf, MD  06/09/2015 9:49 AM    [facility] [address], [place], [place]  [address] Phone: [phone]; Fax: [phone]

## 2015-06-10 NOTE — Telephone Encounter (Signed)
Rx refill sent by Dr Cruzita Lederer.

## 2015-06-18 ENCOUNTER — Other Ambulatory Visit: Payer: Self-pay

## 2015-06-18 ENCOUNTER — Ambulatory Visit (HOSPITAL_COMMUNITY): Payer: Managed Care, Other (non HMO) | Attending: Internal Medicine

## 2015-06-18 DIAGNOSIS — I340 Nonrheumatic mitral (valve) insufficiency: Secondary | ICD-10-CM | POA: Diagnosis not present

## 2015-06-18 DIAGNOSIS — I071 Rheumatic tricuspid insufficiency: Secondary | ICD-10-CM | POA: Insufficient documentation

## 2015-06-18 DIAGNOSIS — I059 Rheumatic mitral valve disease, unspecified: Secondary | ICD-10-CM | POA: Insufficient documentation

## 2015-06-18 DIAGNOSIS — I5189 Other ill-defined heart diseases: Secondary | ICD-10-CM | POA: Insufficient documentation

## 2015-06-18 DIAGNOSIS — R9431 Abnormal electrocardiogram [ECG] [EKG]: Secondary | ICD-10-CM

## 2015-06-18 DIAGNOSIS — I10 Essential (primary) hypertension: Secondary | ICD-10-CM

## 2015-06-18 DIAGNOSIS — E119 Type 2 diabetes mellitus without complications: Secondary | ICD-10-CM | POA: Insufficient documentation

## 2015-06-18 DIAGNOSIS — I517 Cardiomegaly: Secondary | ICD-10-CM | POA: Diagnosis not present

## 2015-07-09 ENCOUNTER — Ambulatory Visit (INDEPENDENT_AMBULATORY_CARE_PROVIDER_SITE_OTHER): Payer: Managed Care, Other (non HMO) | Admitting: Internal Medicine

## 2015-07-09 ENCOUNTER — Encounter: Payer: Self-pay | Admitting: Internal Medicine

## 2015-07-09 VITALS — BP 114/68 | HR 74 | Temp 97.5°F | Resp 12 | Wt 216.0 lb

## 2015-07-09 DIAGNOSIS — E1165 Type 2 diabetes mellitus with hyperglycemia: Secondary | ICD-10-CM

## 2015-07-09 DIAGNOSIS — IMO0002 Reserved for concepts with insufficient information to code with codable children: Secondary | ICD-10-CM

## 2015-07-09 DIAGNOSIS — E1151 Type 2 diabetes mellitus with diabetic peripheral angiopathy without gangrene: Secondary | ICD-10-CM

## 2015-07-09 MED ORDER — INSULIN LISPRO 100 UNIT/ML (KWIKPEN): 14.0000 [IU] | PEN_INJECTOR | Freq: Three times a day (TID) | SUBCUTANEOUS | Status: DC

## 2015-07-09 MED ORDER — INSULIN GLARGINE 100 UNIT/ML SOLOSTAR PEN: 46.0000 [IU] | PEN_INJECTOR | Freq: Every day | SUBCUTANEOUS | Status: DC

## 2015-07-09 MED ORDER — BD PEN NEEDLE NANO U/F 32G X 4 MM MISC: Status: DC

## 2015-07-09 NOTE — Progress Notes (Signed)
Subjective:     Patient ID: Raymond Pugh, male   DOB: December 18, 1960, 55 y.o.   MRN: [id number]  HPI Mr. Einbinder is a pleasant 55 y.o. man,returning for f/u for management of DM2, dx 2008, uncontrolled, insulin-dependent, with complications (peripheral neuropathy). Last visit 2 mo ago. PCP: Dr [name]).  Previously noncompliant with appts and recommended tx's and not checking sugars.Subsequently, last HbA1c this month is very high. Since then, he started to take his medication as advised and checking sugars 2-3 times a day.  He was in the ED with Hyperglycemia 05/22/2015.  Last A hemoglobin A1c: Lab Results  Component Value Date   HGBA1C 11.2 05/10/2015   HGBA1C 8.7* 11/21/2014   HGBA1C 11.3 03/19/2014   He is now on: - Metformin 1000 mg bid - Lantus 20 >> 22 >> 24 >> 30 >> 36 units in at bedtime - Humalog: 14-16 units 3x a day or more ~ sugars Stopped Glipizide. No lows. He has hypoglycemia awareness at <90.  He checks his sugars 2-3x a day now - per meter download: - a.m.:190-250 >> 94-148 >> 232-300 >> 146-263, 368 - before lunch: 120 >> 269, 334 >> 145-352, 422 - 2h after lunch: 289, 300 >> 177-324 - before dinner: 217 >> 249, 390 >> 211-398 - after dinner: 233-275 >> 284-405 >> 187-431  Highest: 431, Lowest: 139 at night  - No kidney dysfunction.  Last BUN/Cr: Lab Results  Component Value Date   BUN 13 05/22/2015   Lab Results  Component Value Date   CREATININE 0.91 05/22/2015  On Ramipril.  - last lipids: Lab Results  Component Value Date   CHOL 156 12/25/2014   HDL 41 12/25/2014   LDLCALC 72 12/25/2014   TRIG 213* 12/25/2014   CHOLHDL 3.8 12/25/2014  On Lovastatin. - Last eye exam: 12/2014, [facility]. He has had retinal detachment surgery.   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  Review of Systems Constitutional: no weight gain, no fatigue, no subjective hyperthermia/hypothermia, increased thirst Eyes: no blurry vision, no  xerophthalmia ENT: no sore throat, no nodules palpated in throat, no dysphagia/odynophagia, no hoarseness Cardiovascular: no CP/SOB/palpitations/leg swelling Respiratory: no cough/SOB Gastrointestinal: no N/V/D/C Musculoskeletal: no muscle/joint aches Skin: no rashes Neurological: no tremors/numbness/tingling/dizziness  I reviewed pt's medications, allergies, PMH, social hx, family hx, and changes were documented in the history of present illness. Otherwise, unchanged from my initial visit note.  Objective:   Physical Exam BP 114/68 mmHg  Pulse 74  Temp(Src) 97.5 F (36.4 C) (Oral)  Resp 12  Wt 216 lb (97.977 kg)  SpO2 94% Body mass index is 30.99 kg/(m^2). Wt Readings from Last 3 Encounters:  07/09/15 216 lb (97.977 kg)  06/09/15 214 lb (97.07 kg)  05/22/15 212 lb (96.163 kg)  Constitutional: overweight, in NAD Eyes: PERRLA, , anisocoria - surgical pupil on the left, EOMI, no exophthalmos ENT: moist mucous membranes, no thyromegaly, no cervical lymphadenopathy Cardiovascular: RRR, No MRG Respiratory: CTA B Gastrointestinal: abdomen soft, NT, ND, BS+ Musculoskeletal: no deformities, strength intact in all 4 Skin: moist, warm, no rashes Neurological: no tremor with outstretched hands, DTR normal in all 4   Assessment:     1.  DM2, uncontrolled, insulin-dependent, with complications - peripheral neuropathy  Plan:     1. Pt with long-standing, uncontrolled, diabetes, now on basal-bolus insulin regimen, however without improvement of his sugars after adding Humalog. His sugars are higher before meals and they tend to decrease after meals, but are still high  throughout. In this case, we will increase the Lantus and at next visit, we will probably need to increase the Humalog. We reviewed previous hemoglobin A1c, which was very high, due to medication noncompliance, we will recheck a new one in a month and a half when he comes back. - I advised him to: Patient Instructions   Please increase the Lantus to 46 units at bedtime. You may need to increase to 50 units if sugars in am not <150.  Keep the Humalog at the current dose of 14-16 units per meal.  Please continue Metformin 1000 mg 2x a day.  Please return in 1.5 months with your sugar log.   - UTD with eye exams - RTC in 1.5 month with his sugar log

## 2015-07-09 NOTE — Patient Instructions (Signed)
Please increase the Lantus to 46 units at bedtime. You may need to increase to 50 units if sugars in am not <150.  Keep the Humalog at the current dose of 14-16 units per meal.  Please continue Metformin 1000 mg 2x a day.  Please return in 1.5 months with your sugar log.

## 2015-07-21 ENCOUNTER — Other Ambulatory Visit: Payer: Self-pay | Admitting: Family Medicine

## 2015-07-29 ENCOUNTER — Other Ambulatory Visit: Payer: Self-pay | Admitting: Family Medicine

## 2015-07-31 ENCOUNTER — Other Ambulatory Visit: Payer: Self-pay | Admitting: Family Medicine

## 2015-08-08 ENCOUNTER — Other Ambulatory Visit: Payer: Self-pay | Admitting: Family Medicine

## 2015-08-12 ENCOUNTER — Other Ambulatory Visit: Payer: Self-pay | Admitting: Family Medicine

## 2015-08-20 ENCOUNTER — Encounter: Payer: Self-pay | Admitting: Internal Medicine

## 2015-08-20 ENCOUNTER — Ambulatory Visit (INDEPENDENT_AMBULATORY_CARE_PROVIDER_SITE_OTHER): Payer: Managed Care, Other (non HMO) | Admitting: Internal Medicine

## 2015-08-20 ENCOUNTER — Other Ambulatory Visit (INDEPENDENT_AMBULATORY_CARE_PROVIDER_SITE_OTHER): Payer: Managed Care, Other (non HMO) | Admitting: *Deleted

## 2015-08-20 VITALS — BP 132/78 | HR 83 | Temp 97.8°F | Resp 12 | Wt 212.6 lb

## 2015-08-20 DIAGNOSIS — E1151 Type 2 diabetes mellitus with diabetic peripheral angiopathy without gangrene: Secondary | ICD-10-CM

## 2015-08-20 DIAGNOSIS — IMO0002 Reserved for concepts with insufficient information to code with codable children: Secondary | ICD-10-CM

## 2015-08-20 DIAGNOSIS — E1165 Type 2 diabetes mellitus with hyperglycemia: Secondary | ICD-10-CM

## 2015-08-20 LAB — POCT GLYCOSYLATED HEMOGLOBIN (HGB A1C): Hemoglobin A1C: 9.6

## 2015-08-20 NOTE — Progress Notes (Signed)
Subjective:     Patient ID: Raymond Pugh, male   DOB: April 30, 1961, 55 y.o.   MRN: [id number]  HPI Raymond Pugh is a pleasant 55 y.o. man,returning for f/u for management of DM2, dx 2008, uncontrolled, insulin-dependent, with complications (peripheral neuropathy). Last visit 1.5 mo ago.  Previously noncompliant with appts and recommended tx's and not checking sugars. Subsequently, last HbA1c in 04/2015 was very high. He was in the ED with Hyperglycemia 05/22/2015. Since then, he started to take his medication as advised and checking sugars 2-3 times a day.  Last A hemoglobin A1c: Lab Results  Component Value Date   HGBA1C 9.6 08/20/2015   HGBA1C 11.2 05/10/2015   HGBA1C 8.7* 11/21/2014   He is now on: - Lantus 46 units at bedtime. - Humalog 14-16 units before the meals. (2x a day - only has crackers for lunch) - Metformin 1000 mg 2x a day Stopped Glipizide. No lows. He has hypoglycemia awareness at <90.  He checks his sugars 2-3x a day now - no log, no meter: - a.m.:190-250 >> 94-148 >> 232-300 >> 146-263, 368 >> 108, 118-120s, 220 (if large meal at night) - before lunch: 120 >> 269, 334 >> 145-352, 422 >> 120-220 - 2h after lunch: 289, 300 >> 177-324 >> n/c - before dinner: 217 >> 249, 390 >> 211-398 >> 120s- rarely 200s - after dinner: 233-275 >> 284-405 >> 187-431 >> 200s Highest: 431 >> 200s, Lowest: 139 >> 108  - No kidney dysfunction.  Last BUN/Cr: Lab Results  Component Value Date   BUN 13 05/22/2015   Lab Results  Component Value Date   CREATININE 0.91 05/22/2015  On Ramipril.  - last lipids: Lab Results  Component Value Date   CHOL 156 12/25/2014   HDL 41 12/25/2014   LDLCALC 72 12/25/2014   TRIG 213* 12/25/2014   CHOLHDL 3.8 12/25/2014  On Lovastatin. - Last eye exam: 12/2014, [facility]. He has had retinal detachment surgery.   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  Review of Systems Constitutional: no weight gain, no fatigue, no subjective  hyperthermia/hypothermia Eyes: no blurry vision, no xerophthalmia ENT: no sore throat, no nodules palpated in throat, no dysphagia/odynophagia, no hoarseness Cardiovascular: no CP/SOB/palpitations/leg swelling Respiratory: no cough/SOB Gastrointestinal: no N/V/D/C Musculoskeletal: no muscle/joint aches Skin: no rashes Neurological: no tremors/numbness/tingling/dizziness  I reviewed pt's medications, allergies, PMH, social hx, family hx, and changes were documented in the history of present illness. Otherwise, unchanged from my initial visit note.  Objective:   Physical Exam BP 132/78 mmHg  Pulse 83  Temp(Src) 97.8 F (36.6 C) (Oral)  Resp 12  Wt 212 lb 9.6 oz (96.435 kg)  SpO2 97% Body mass index is 30.51 kg/(m^2). Wt Readings from Last 3 Encounters:  08/20/15 212 lb 9.6 oz (96.435 kg)  07/09/15 216 lb (97.977 kg)  06/09/15 214 lb (97.07 kg)  Constitutional: overweight, in NAD Eyes: PERRLA, , anisocoria - surgical pupil on the left, EOMI, no exophthalmos ENT: moist mucous membranes, no thyromegaly, no cervical lymphadenopathy Cardiovascular: RRR, No MRG Respiratory: CTA B Gastrointestinal: abdomen soft, NT, ND, BS+ Musculoskeletal: no deformities, strength intact in all 4 Skin: moist, warm, no rashes Neurological: no tremor with outstretched hands, DTR normal in all 4   Assessment:     1.  DM2, uncontrolled, insulin-dependent, with complications - peripheral neuropathy  Plan:     1. Pt with long-standing, uncontrolled, diabetes, now on basal-bolus insulin regimen, with improvement of his sugars after adding Humalog but previous  hemoglobin A1c, which was very high, due to medication noncompliance. Now he is back on track but does not bring a sugar log or meter. CBGs appear better per his recall but I cannot make changes in his regimen w/o CBG levels. HbA1c today >> 9.6% (better). Will continue current regimen and will also improve his diet >> discussed changes. - I advised  him to: Patient Instructions  Please continue: - Lantus 46 units at bedtime. - Humalog14-16 units before the meals. - Metformin 1000 mg 2x a day.  Please return in 3 months with your sugar log.   - UTD with eye exams - RTC in 3 month with his sugar log

## 2015-08-20 NOTE — Patient Instructions (Signed)
Please continue: - Lantus 46 units at bedtime. - Humalog 14-16 units before the meals - Metformin 1000 mg 2x a day  Please return in 3 months with your sugar log.  Please consider the following ways to cut down carbs and fat and increase fiber and micronutrients in your diet: - substitute whole grain for white bread or pasta - substitute brown rice for white rice - substitute 90-calorie flat bread pieces for slices of bread when possible - substitute sweet potatoes or yams for white potatoes - substitute humus for margarine - substitute tofu for cheese when possible - substitute almond or rice milk for regular milk (would not drink soy milk daily due to concern for soy estrogen influence on breast cancer risk) - substitute dark chocolate for other sweets when possible - substitute water - can add lemon or orange slices for taste - for diet sodas (artificial sweeteners will trick your body that you can eat sweets without getting calories and will lead you to overeating and weight gain in the long run) - do not skip breakfast or other meals (this will slow down the metabolism and will result in more weight gain over time)  - can try smoothies made from fruit and almond/rice milk in am instead of regular breakfast - can also try old-fashioned (not instant) oatmeal made with almond/rice milk in am - order the dressing on the side when eating salad at a restaurant (pour less than half of the dressing on the salad) - eat as little meat as possible - can try juicing, but should not forget that juicing will get rid of the fiber, so would alternate with eating raw veg./fruits or drinking smoothies - use as little oil as possible, even when using olive oil - can dress a salad with a mix of balsamic vinegar and lemon juice, for e.g. - use agave nectar, stevia sugar, or regular sugar rather than artificial sweateners - steam or broil/roast veggies  - snack on veggies/fruit/nuts (unsalted, preferably)  when possible, rather than processed foods - reduce or eliminate aspartame in diet (it is in diet sodas, chewing gum, etc) Read the labels!  Try to read Dr. Janene Harvey book: "Program for Reversing Diabetes" for other ideas for healthy eating.     Alyssa Grove: "Program for Reversing Diabetes"  Norma Fredrickson: "[employer] Vegan" (cookbook)

## 2015-10-13 ENCOUNTER — Other Ambulatory Visit: Payer: Self-pay

## 2015-10-13 MED ORDER — METOPROLOL TARTRATE 100 MG PO TABS: 100.0000 mg | ORAL_TABLET | Freq: Two times a day (BID) | ORAL | Status: DC

## 2015-10-30 ENCOUNTER — Other Ambulatory Visit: Payer: Self-pay | Admitting: Family Medicine

## 2015-11-09 ENCOUNTER — Other Ambulatory Visit: Payer: Self-pay | Admitting: Family Medicine

## 2015-11-20 ENCOUNTER — Ambulatory Visit: Payer: Managed Care, Other (non HMO) | Admitting: Internal Medicine

## 2015-11-20 DIAGNOSIS — Z0289 Encounter for other administrative examinations: Secondary | ICD-10-CM

## 2015-11-24 ENCOUNTER — Ambulatory Visit (INDEPENDENT_AMBULATORY_CARE_PROVIDER_SITE_OTHER): Payer: Managed Care, Other (non HMO) | Admitting: Emergency Medicine

## 2015-11-24 VITALS — BP 120/72 | HR 67 | Temp 98.2°F | Resp 18 | Ht 70.0 in | Wt 208.4 lb

## 2015-11-24 DIAGNOSIS — Z794 Long term (current) use of insulin: Secondary | ICD-10-CM | POA: Diagnosis not present

## 2015-11-24 DIAGNOSIS — F411 Generalized anxiety disorder: Secondary | ICD-10-CM

## 2015-11-24 DIAGNOSIS — E785 Hyperlipidemia, unspecified: Secondary | ICD-10-CM

## 2015-11-24 DIAGNOSIS — E119 Type 2 diabetes mellitus without complications: Secondary | ICD-10-CM

## 2015-11-24 DIAGNOSIS — F909 Attention-deficit hyperactivity disorder, unspecified type: Secondary | ICD-10-CM | POA: Diagnosis not present

## 2015-11-24 DIAGNOSIS — I10 Essential (primary) hypertension: Secondary | ICD-10-CM | POA: Diagnosis not present

## 2015-11-24 DIAGNOSIS — F988 Other specified behavioral and emotional disorders with onset usually occurring in childhood and adolescence: Secondary | ICD-10-CM

## 2015-11-24 LAB — POCT CBC
GRANULOCYTE PERCENT: 64.6 % (ref 37–80)
HEMATOCRIT: 42.2 % — AB (ref 43.5–53.7)
HEMOGLOBIN: 15 g/dL (ref 14.1–18.1)
Lymph, poc: 2.7 (ref 0.6–3.4)
MCH: 31.6 pg — AB (ref 27–31.2)
MCHC: 35.6 g/dL — AB (ref 31.8–35.4)
MCV: 88.7 fL (ref 80–97)
MID (cbc): 0.4 (ref 0–0.9)
MPV: 7.3 fL (ref 0–99.8)
POC GRANULOCYTE: 5.7 (ref 2–6.9)
POC LYMPH PERCENT: 30.6 %L (ref 10–50)
POC MID %: 4.8 % (ref 0–12)
Platelet Count, POC: 184 10*3/uL (ref 142–424)
RBC: 4.75 M/uL (ref 4.69–6.13)
RDW, POC: 14.4 %
WBC: 8.8 10*3/uL (ref 4.6–10.2)

## 2015-11-24 LAB — COMPLETE METABOLIC PANEL WITH GFR
ALBUMIN: 3.7 g/dL (ref 3.6–5.1)
ALK PHOS: 75 U/L (ref 40–115)
ALT: 19 U/L (ref 9–46)
AST: 17 U/L (ref 10–35)
BUN: 17 mg/dL (ref 7–25)
CALCIUM: 8.3 mg/dL — AB (ref 8.6–10.3)
CHLORIDE: 98 mmol/L (ref 98–110)
CO2: 27 mmol/L (ref 20–31)
CREATININE: 0.73 mg/dL (ref 0.70–1.33)
GFR, Est Non African American: >89 mL/min (ref >=60)
Glucose, Bld: 270 mg/dL — ABNORMAL HIGH (ref 65–99)
POTASSIUM: 3.6 mmol/L (ref 3.5–5.3)
Sodium: 138 mmol/L (ref 135–146)
Total Bilirubin: 0.4 mg/dL (ref 0.2–1.2)
Total Protein: 6.2 g/dL (ref 6.1–8.1)

## 2015-11-24 LAB — LIPID PANEL
CHOL/HDL RATIO: 3.6 ratio (ref <=5.0)
CHOLESTEROL: 157 mg/dL (ref 125–200)
HDL: 44 mg/dL (ref >=40)
LDL Cholesterol: 83 mg/dL (ref <130)
Triglycerides: 148 mg/dL (ref <150)
VLDL: 30 mg/dL (ref <30)

## 2015-11-24 LAB — POCT GLYCOSYLATED HEMOGLOBIN (HGB A1C): Hemoglobin A1C: >14

## 2015-11-24 LAB — MICROALBUMIN, URINE: MICROALB UR: 1.5 mg/dL

## 2015-11-24 LAB — GLUCOSE, POCT (MANUAL RESULT ENTRY): POC GLUCOSE: 282 mg/dL — AB (ref 70–99)

## 2015-11-24 MED ORDER — LOVASTATIN 20 MG PO TABS: ORAL_TABLET | ORAL | Status: DC

## 2015-11-24 MED ORDER — ALPRAZOLAM 1 MG PO TABS: 0.5000 mg | ORAL_TABLET | Freq: Every day | ORAL | Status: DC | PRN

## 2015-11-24 MED ORDER — AMPHETAMINE-DEXTROAMPHET ER 20 MG PO CP24: ORAL_CAPSULE | ORAL | Status: DC

## 2015-11-24 MED ORDER — INSULIN LISPRO 100 UNIT/ML (KWIKPEN): 14.0000 [IU] | PEN_INJECTOR | Freq: Three times a day (TID) | SUBCUTANEOUS | Status: DC

## 2015-11-24 MED ORDER — GLUCOSE BLOOD VI STRP: ORAL_STRIP | Status: DC

## 2015-11-24 MED ORDER — BD PEN NEEDLE NANO U/F 32G X 4 MM MISC: Status: DC

## 2015-11-24 MED ORDER — INSULIN GLARGINE 100 UNIT/ML SOLOSTAR PEN: 46.0000 [IU] | PEN_INJECTOR | Freq: Every day | SUBCUTANEOUS | Status: DC

## 2015-11-24 MED ORDER — HYDROCHLOROTHIAZIDE 25 MG PO TABS: 25.0000 mg | ORAL_TABLET | Freq: Every day | ORAL | Status: DC

## 2015-11-24 MED ORDER — PANTOPRAZOLE SODIUM 40 MG PO TBEC: 40.0000 mg | DELAYED_RELEASE_TABLET | Freq: Every day | ORAL | Status: DC

## 2015-11-24 MED ORDER — RAMIPRIL 10 MG PO CAPS: 10.0000 mg | ORAL_CAPSULE | Freq: Two times a day (BID) | ORAL | Status: DC

## 2015-11-24 MED ORDER — METOPROLOL TARTRATE 100 MG PO TABS: 100.0000 mg | ORAL_TABLET | Freq: Two times a day (BID) | ORAL | Status: DC

## 2015-11-24 MED ORDER — ONETOUCH VERIO FLEX SYSTEM W/DEVICE KIT: 1.0000 | PACK | Freq: Every day | Status: DC

## 2015-11-24 MED ORDER — METFORMIN HCL 1000 MG PO TABS: 1000.0000 mg | ORAL_TABLET | Freq: Two times a day (BID) | ORAL | Status: DC

## 2015-11-24 MED ORDER — ONETOUCH DELICA LANCETS FINE MISC: Status: DC

## 2015-11-24 NOTE — Patient Instructions (Addendum)
Your blood sugar is under terrible control. You need to make an appointment to see your endocrinologist. Her medications were refilled. You need to take your insulin as instructed and check your sugars every day as you were instructed previously.I personally performed the services described in this documentation, which was scribed in my presence. The recorded information has been reviewed and is accurate.    IF you received an x-ray today, you will receive an invoice from [facility]. Please contact [facility] at [phone] with questions or concerns regarding your invoice.   IF you received labwork today, you will receive an invoice from [facility] [employer]. Please contact [facility] at [phone] with questions or concerns regarding your invoice.   Our billing staff will not be able to assist you with questions regarding bills from these companies.  You will be contacted with the lab results as soon as they are available. The fastest way to get your results is to activate your My Chart account. Instructions are located on the last page of this paperwork. If you have not heard from [place] regarding the results in 2 weeks, please contact this office.

## 2015-11-24 NOTE — Progress Notes (Signed)
By signing my name below, I, [name], attest that this documentation has been prepared under the direction and in the presence of Arlyss Queen, MD.  Electronically Signed: Thea Pugh, ED Scribe. 11/24/2015. 8:46 AM.  Chief Complaint:  Chief Complaint  Patient presents with  . Medication Refill    on ALL medications    HPI: Raymond Pugh is a 55 y.o. male who reports to [facility] today for medication refill. Pt is followed by Dr. Renne Crigler for Diabetes, which is uncontrolled. His last visit with Dr. Cruzita Lederer  Humalog 14-16 units before meals, lantus 46 units at bedtime and metformin 1000 mg bid and advised to RTC in 3 months which he has not done. States he was unable to make his appointment with Dr. Cruzita Lederer last week but plans to reschedule. Lab Results  Component Value Date   HGBA1C 9.6 08/20/2015     Pt is needing all medication refills today alprazolam - last had this fill 12/17/ 2016. Pt takes one every now and then.  adderall- hx of ADD. Also takes this as needed, usually on his busy days. He has been on this medication for about 7 years. Last seen by cardiologist, Dr. Marlou Porch 06/09/15, previously Dr. Lovena Le. Was told to follow up as needed for cardiac related symptoms.   Immunization History  Administered Date(s) Administered  . Td 04/23/2006   Pt refuses pneumonia vaccine.   Past Medical History  Diagnosis Date  . Hypertension   . Hyperlipemia   . Diabetes mellitus   . Barrett's esophagus   . Hiatal hernia   . ADHD (attention deficit hyperactivity disorder)   . Anemia   . Myocarditis ([place])     h/o mypocarditis, caused cardiomyopathy-- resolved    Past Surgical History  Procedure Laterality Date  . Hydrocele excision / repair    . Retinal detachment surgery    . Radiokeratotomy    . Eye surgery    . Abscess drain liver perc ([facility] hx)      klebsiella on liver pa states    Social History   Social History  . Marital Status: Married    Spouse Name: N/A  . Number  of Children: 2  . Years of Education: N/A   Occupational History  . student, part time job     [employer], Architect   Social History Main Topics  . Smoking status: Never Smoker   . Smokeless tobacco: Never Used  . Alcohol Use: 0.6 oz/week    1 Cans of beer per week     Comment: socially   . Drug Use: No  . Sexual Activity: Not Asked     Comment: not asked   Other Topics Concern  . None   Social History Narrative   2 children + 2 step children ---    Has a part time job, Ship broker    Exercise swimming   Family History  Problem Relation Age of Onset  . Diabetes Father   . Hypertension Father   . Colon polyps Father   . Hypertension Mother   . Asthma Mother   . Prostate cancer Neg Hx    No Known Allergies Prior to Admission medications   Medication Sig Start Date End Date Taking? Authorizing Provider  ALPRAZolam Duanne Moron) 1 MG tablet Take 0.5-1 tablets (0.5-1 mg total) by mouth daily as needed (for anxiety). 05/10/15  Yes Wendie Agreste, MD  amphetamine-dextroamphetamine (ADDERALL XR) 20 MG 24 hr capsule Take 1 Capsules by mouth every day  prn 05/10/15  Yes Wendie Agreste, MD  BD PEN NEEDLE NANO U/F 32G X 4 MM MISC Use 4 times daily 07/09/15  Yes [name], MD  Blood Glucose Monitoring Suppl ([place]) w/Device KIT 1 each by Does not apply route daily. Dx: E11.51 05/27/15  Yes [name], MD  glucose blood (ONETOUCH VERIO) test strip Use to test blood sugar 4-6 times daily as instructed. Dx: E11.51 05/27/15  Yes [name], MD  hydrochlorothiazide (HYDRODIURIL) 25 MG tablet Take 1 tablet (25 mg total) by mouth daily. 11/10/15  Yes Wendie Agreste, MD  Insulin Glargine (LANTUS SOLOSTAR) 100 UNIT/ML Solostar Pen Inject 46 Units into the skin daily at 10 pm. 07/09/15  Yes [name], MD  insulin lispro (HUMALOG KWIKPEN) 100 UNIT/ML KiwkPen Inject 0.14-0.16 mLs (14-16 Units total) into the skin 3 (three) times daily. 07/09/15  Yes [name], MD  lovastatin (MEVACOR) 20 MG tablet TAKE ONE TABLET BY MOUTH EVERY NIGHT AT BEDTIME. NO MORE REFILLS WITHOUT OFFICE VISIT 05/23/15  Yes Mancel Bale, PA-C  metFORMIN (GLUCOPHAGE) 1000 MG tablet TAKE 1 TABLET TWICE A DAY 08/01/15  Yes Wendie Agreste, MD  metoprolol (LOPRESSOR) 100 MG tablet Take 1 tablet (100 mg total) by mouth 2 (two) times daily. 10/13/15  Yes Wendie Agreste, MD  [facility] DELICA LANCETS FINE MISC Use to test blood sugar 4-6 times daily as instructed. Dx: E11.51 05/27/15  Yes [name], MD  pantoprazole (PROTONIX) 40 MG tablet TAKE 1 TABLET EVERY MORNING BEFORE BREAKFAST 10/30/15  Yes Wendie Agreste, MD  ramipril (ALTACE) 10 MG capsule TAKE 1 CAPSULE TWICE A DAY 08/13/15  Yes Wendie Agreste, MD     ROS: The patient denies fevers, chills, night sweats, unintentional weight loss, chest pain, palpitations, wheezing, dyspnea on exertion, nausea, vomiting, abdominal pain, dysuria, hematuria, melena, numbness, weakness, or tingling.   All other systems have been reviewed and were otherwise negative with the exception of those mentioned in the HPI and as above.    PHYSICAL EXAM: Filed Vitals:   11/24/15 0824  BP: 120/72  Pulse: 67  Temp: 98.2 F (36.8 C)  Resp: 18   Body mass index is 29.9 kg/(m^2).   General: Alert, no acute distress HEENT:  Normocephalic, atraumatic, oropharynx patent. Eye: Juliette Mangle [facility] Cardiovascular:  Regular rate and rhythm, no rubs murmurs or gallops.  No Carotid bruits, radial pulse intact. No pedal edema.  Respiratory: Clear to auscultation bilaterally.  No wheezes, rales, or rhonchi.  No cyanosis, no use of accessory musculature Abdominal: No organomegaly, abdomen is soft and non-tender, positive bowel sounds.  No masses. Musculoskeletal: Gait intact. No edema, tenderness. Normal sensation  Skin: No rashes. Neurologic: Facial musculature symmetric. Psychiatric: Patient acts appropriately throughout our  interaction. Lymphatic: No cervical or submandibular lymphadenopathy    LABS: Results for orders placed or performed in visit on 11/24/15  POCT CBC  Result Value Ref Range   WBC 8.8 4.6 - 10.2 K/uL   Lymph, poc 2.7 0.6 - 3.4   POC LYMPH PERCENT 30.6 10 - 50 %L   MID (cbc) 0.4 0 - 0.9   POC MID % 4.8 0 - 12 %M   POC Granulocyte 5.7 2 - 6.9   Granulocyte percent 64.6 37 - 80 %G   RBC 4.75 4.69 - 6.13 M/uL   Hemoglobin 15.0 14.1 - 18.1 g/dL   HCT, POC 42.2 (A) 43.5 - 53.7 %   MCV 88.7 80 - 97 fL  MCH, POC 31.6 (A) 27 - 31.2 pg   MCHC 35.6 (A) 31.8 - 35.4 g/dL   RDW, POC 14.4 %   Platelet Count, POC 184 142 - 424 K/uL   MPV 7.3 0 - 99.8 fL  POCT glucose (manual entry)  Result Value Ref Range   POC Glucose 282 (A) 70 - 99 mg/dl  POCT glycosylated hemoglobin (Hb A1C)  Result Value Ref Range   Hemoglobin A1C >14.0     ASSESSMENT/PLAN: I told the patient from his blood work he is not being compliant with the regimen given to him by his endocrinologist. I warned him of the complications he is going to experience if he does not get his diabetes under control. I told him he needed to make an appointment to see his endocrinologist to get help. Medications were refilled. Also send a note to Dr. Gypsy Balsam regarding his intermittent use of stimulant medication.I personally performed the services described in this documentation, which was scribed in my presence. The recorded information has been reviewed and is accurate.   Gross sideeffects, risk and benefits, and alternatives of medications d/w patient. Patient is aware that all medications have potential sideeffects and we are unable to predict every sideeffect or drug-drug interaction that may occur.  Arlyss Queen MD 11/24/2015 8:30 AM

## 2015-12-05 ENCOUNTER — Telehealth: Payer: Self-pay

## 2015-12-05 NOTE — Telephone Encounter (Signed)
Pt will be contacting Dr. Cruzita Lederer.  He wanted to get his sugars under control better but now they are better and he will call and make appt.

## 2015-12-05 NOTE — Telephone Encounter (Signed)
-----   Message from Darlyne Russian, MD sent at 11/25/2015  8:52 AM EDT ----- Sugars significantly elevated. Calcium is also low. Both will need follow-up. Be sure he makes an appointment to see Dr. Cruzita Lederer

## 2016-01-21 ENCOUNTER — Emergency Department (HOSPITAL_COMMUNITY)
Admission: EM | Admit: 2016-01-21 | Discharge: 2016-01-22 | Disposition: A | Discharge status: Home / Self Care | Payer: Managed Care, Other (non HMO) | Attending: Emergency Medicine | Admitting: Emergency Medicine

## 2016-01-21 ENCOUNTER — Encounter (HOSPITAL_COMMUNITY): Payer: Self-pay

## 2016-01-21 DIAGNOSIS — Z79899 Other long term (current) drug therapy: Secondary | ICD-10-CM | POA: Insufficient documentation

## 2016-01-21 DIAGNOSIS — N179 Acute kidney failure, unspecified: Secondary | ICD-10-CM | POA: Insufficient documentation

## 2016-01-21 DIAGNOSIS — Z7984 Long term (current) use of oral hypoglycemic drugs: Secondary | ICD-10-CM | POA: Insufficient documentation

## 2016-01-21 DIAGNOSIS — E1165 Type 2 diabetes mellitus with hyperglycemia: Secondary | ICD-10-CM | POA: Insufficient documentation

## 2016-01-21 DIAGNOSIS — R739 Hyperglycemia, unspecified: Secondary | ICD-10-CM

## 2016-01-21 DIAGNOSIS — E860 Dehydration: Secondary | ICD-10-CM | POA: Diagnosis not present

## 2016-01-21 DIAGNOSIS — F909 Attention-deficit hyperactivity disorder, unspecified type: Secondary | ICD-10-CM | POA: Insufficient documentation

## 2016-01-21 DIAGNOSIS — I10 Essential (primary) hypertension: Secondary | ICD-10-CM | POA: Diagnosis not present

## 2016-01-21 DIAGNOSIS — K6289 Other specified diseases of anus and rectum: Secondary | ICD-10-CM | POA: Diagnosis present

## 2016-01-21 DIAGNOSIS — K611 Rectal abscess: Secondary | ICD-10-CM | POA: Diagnosis not present

## 2016-01-21 LAB — DIFFERENTIAL
Basophils Absolute: 0 10*3/uL (ref 0.0–0.1)
Basophils Relative: 0 %
EOS PCT: 1 %
Eosinophils Absolute: 0.1 10*3/uL (ref 0.0–0.7)
LYMPHS PCT: 13 %
Lymphs Abs: 2.1 10*3/uL (ref 0.7–4.0)
MONO ABS: 1 10*3/uL (ref 0.1–1.0)
Monocytes Relative: 6 %
Neutro Abs: 12.9 10*3/uL — ABNORMAL HIGH (ref 1.7–7.7)
Neutrophils Relative %: 80 %

## 2016-01-21 LAB — HEPATIC FUNCTION PANEL
ALBUMIN: 4 g/dL (ref 3.5–5.0)
ALT: 20 U/L (ref 17–63)
AST: 31 U/L (ref 15–41)
Alkaline Phosphatase: 102 U/L (ref 38–126)
BILIRUBIN TOTAL: 1.1 mg/dL (ref 0.3–1.2)
Bilirubin, Direct: 0.3 mg/dL (ref 0.1–0.5)
Indirect Bilirubin: 0.8 mg/dL (ref 0.3–0.9)
TOTAL PROTEIN: 8.3 g/dL — AB (ref 6.5–8.1)

## 2016-01-21 LAB — URINE MICROSCOPIC-ADD ON

## 2016-01-21 LAB — URINALYSIS, ROUTINE W REFLEX MICROSCOPIC
Bilirubin Urine: NEGATIVE
Glucose, UA: >1000 mg/dL — AB
Hgb urine dipstick: NEGATIVE
KETONES UR: NEGATIVE mg/dL
Leukocytes, UA: NEGATIVE
NITRITE: NEGATIVE
PROTEIN: NEGATIVE mg/dL
Specific Gravity, Urine: 1.029 (ref 1.005–1.030)
pH: 5 (ref 5.0–8.0)

## 2016-01-21 LAB — CBC
HCT: 43.9 % (ref 39.0–52.0)
HEMOGLOBIN: 15.6 g/dL (ref 13.0–17.0)
MCH: 31.1 pg (ref 26.0–34.0)
MCHC: 35.5 g/dL (ref 30.0–36.0)
MCV: 87.5 fL (ref 78.0–100.0)
Platelets: 272 10*3/uL (ref 150–400)
RBC: 5.02 MIL/uL (ref 4.22–5.81)
RDW: 13.3 % (ref 11.5–15.5)
WBC: 16.1 10*3/uL — AB (ref 4.0–10.5)

## 2016-01-21 LAB — BLOOD GAS, VENOUS
ACID-BASE EXCESS: 1.9 mmol/L (ref 0.0–2.0)
Bicarbonate: 24.6 mmol/L (ref 20.0–28.0)
FIO2: 0.21
O2 Saturation: 92.9 %
PH VEN: 7.467 — AB (ref 7.250–7.430)
Patient temperature: 98.6
pCO2, Ven: 34.5 mmHg — ABNORMAL LOW (ref 44.0–60.0)
pO2, Ven: 64.1 mmHg — ABNORMAL HIGH (ref 32.0–45.0)

## 2016-01-21 LAB — BASIC METABOLIC PANEL
ANION GAP: 14 (ref 5–15)
BUN: 27 mg/dL — ABNORMAL HIGH (ref 6–20)
CALCIUM: 9.4 mg/dL (ref 8.9–10.3)
CO2: 24 mmol/L (ref 22–32)
Chloride: 84 mmol/L — ABNORMAL LOW (ref 101–111)
Creatinine, Ser: 1.42 mg/dL — ABNORMAL HIGH (ref 0.61–1.24)
GFR, EST NON AFRICAN AMERICAN: 55 mL/min — AB (ref >60)
Glucose, Bld: 673 mg/dL (ref 65–99)
Potassium: 3.5 mmol/L (ref 3.5–5.1)
SODIUM: 122 mmol/L — AB (ref 135–145)

## 2016-01-21 LAB — CBG MONITORING, ED: GLUCOSE-CAPILLARY: 366 mg/dL — AB (ref 65–99)

## 2016-01-21 LAB — LIPASE, BLOOD: LIPASE: 30 U/L (ref 11–51)

## 2016-01-21 MED ORDER — SODIUM CHLORIDE 0.9 % IV BOLUS (SEPSIS)
1000.0000 mL | Freq: Once | INTRAVENOUS | Status: AC
  Administered 2016-01-21: 1000 mL via INTRAVENOUS

## 2016-01-21 MED ORDER — LIDOCAINE HCL 1 % IJ SOLN
10.0000 mL | Freq: Once | INTRAMUSCULAR | Status: AC
  Administered 2016-01-21: 10 mL
  Filled 2016-01-21: qty 20

## 2016-01-21 MED ORDER — DOCUSATE SODIUM 100 MG PO CAPS: 100.0000 mg | ORAL_CAPSULE | Freq: Two times a day (BID) | ORAL | 0 refills | Status: DC

## 2016-01-21 MED ORDER — INSULIN ASPART 100 UNIT/ML ~~LOC~~ SOLN
10.0000 [IU] | Freq: Once | SUBCUTANEOUS | Status: AC
  Administered 2016-01-21: 10 [IU] via INTRAVENOUS
  Filled 2016-01-21: qty 1

## 2016-01-21 MED ORDER — ONDANSETRON HCL 4 MG/2ML IJ SOLN: 4.0000 mg | INTRAMUSCULAR | Status: DC | PRN

## 2016-01-21 MED ORDER — OXYCODONE HCL 5 MG PO TABS
5.0000 mg | ORAL_TABLET | ORAL | Status: AC
  Administered 2016-01-21: 5 mg via ORAL
  Filled 2016-01-21: qty 1

## 2016-01-21 NOTE — ED Provider Notes (Signed)
[facility] DEPT Provider Note   CSN: [id number] Arrival date & time: 01/21/16  2006     History   Chief Complaint Chief Complaint  Patient presents with  . Hyperglycemia  . Rectal Pain    HPI Raymond Pugh is a 55 y.o. male.  The history is provided by the patient.  Abscess  Location:  Pelvis Pelvic abscess location:  Anus Size:  2cm Abscess quality: fluctuance, induration and painful   Duration:  1 week Progression:  Worsening Pain details:    Quality:  Aching and sharp   Severity:  Moderate   Timing:  Constant   Progression:  Worsening Chronicity:  New Context: diabetes (and has been noncompliant with glucose shecks since pain started)   Relieved by:  Nothing Worsened by:  Nothing Ineffective treatments:  None tried Associated symptoms: fatigue   Associated symptoms: no fever and no vomiting     Past Medical History:  Diagnosis Date  . ADHD (attention deficit hyperactivity disorder)   . Anemia   . Barrett's esophagus   . Diabetes mellitus   . Hiatal hernia   . Hyperlipemia   . Hypertension   . Myocarditis ([place])    h/o mypocarditis, caused cardiomyopathy-- resolved     Patient Active Problem List   Diagnosis Date Noted  . Liver abscess 06/24/2014  . Eyelid lesion 05/29/2012  . Obesity 08/25/2010  . BARRETTS ESOPHAGUS 07/03/2010  . Iron deficiency anemia, unspecified 11/16/2009  . Hyperlipidemia 11/04/2008  . ADHD 08/14/2007  . MOLE 03/13/2007  . Uncontrolled type 2 diabetes mellitus with peripheral angiopathy ([place]) 03/13/2007  . Essential hypertension 11/03/2006    Past Surgical History:  Procedure Laterality Date  . ABSCESS DRAIN LIVER PERC ([facility] HX)     klebsiella on liver pa states   . EYE SURGERY    . HYDROCELE EXCISION / REPAIR    . radiokeratotomy    . RETINAL DETACHMENT SURGERY         Home Medications    Prior to Admission medications   Medication Sig Start Date End Date Taking? Authorizing Provider  ALPRAZolam  Duanne Moron) 1 MG tablet Take 0.5-1 tablets (0.5-1 mg total) by mouth daily as needed (for anxiety). 11/24/15  Yes Darlyne Russian, MD  amphetamine-dextroamphetamine (ADDERALL XR) 20 MG 24 hr capsule Take 1 Capsules by mouth every day prn Patient taking differently: Take 20 mg by mouth daily as needed (ahdh). Take 1 Capsules by mouth every day prn 11/24/15  Yes Darlyne Russian, MD  hydrochlorothiazide (HYDRODIURIL) 25 MG tablet Take 1 tablet (25 mg total) by mouth daily. 11/24/15  Yes Darlyne Russian, MD  Insulin Glargine (LANTUS SOLOSTAR) 100 UNIT/ML Solostar Pen Inject 46 Units into the skin daily at 10 pm. 11/24/15  Yes Darlyne Russian, MD  insulin lispro (HUMALOG KWIKPEN) 100 UNIT/ML KiwkPen Inject 0.14-0.16 mLs (14-16 Units total) into the skin 3 (three) times daily. Patient taking differently: Inject 14-16 Units into the skin 3 (three) times daily. Take 14-16 units TID Depending on Size of Meal. Pt didn't specify any further. 11/24/15  Yes Darlyne Russian, MD  lovastatin (MEVACOR) 20 MG tablet Take 1 tablet   at night by mouth. Patient taking differently: Take 20 mg by mouth at bedtime. Take 1 tablet   at night by mouth. 11/24/15  Yes Darlyne Russian, MD  metFORMIN (GLUCOPHAGE) 1000 MG tablet Take 1 tablet (1,000 mg total) by mouth 2 (two) times daily. 11/24/15  Yes Darlyne Russian, MD  metoprolol (  LOPRESSOR) 100 MG tablet Take 1 tablet (100 mg total) by mouth 2 (two) times daily. 11/24/15  Yes Darlyne Russian, MD  pantoprazole (PROTONIX) 40 MG tablet Take 1 tablet (40 mg total) by mouth daily. 11/24/15  Yes Darlyne Russian, MD  ramipril (ALTACE) 10 MG capsule Take 1 capsule (10 mg total) by mouth 2 (two) times daily. 11/24/15  Yes Darlyne Russian, MD  BD PEN NEEDLE NANO U/F 32G X 4 MM MISC Use 4 times daily 11/24/15   Darlyne Russian, MD  Blood Glucose Monitoring Suppl ([place]) w/Device KIT 1 each by Does not apply route daily. Dx: E11.51 11/24/15   Darlyne Russian, MD  glucose blood (ONETOUCH VERIO) test strip Use to test  blood sugar 4-6 times daily as instructed. Dx: E11.51 11/24/15   Darlyne Russian, MD  [facility] DELICA LANCETS FINE MISC Use to test blood sugar 4-6 times daily as instructed. Dx: E11.51 11/24/15   Darlyne Russian, MD    Family History Family History  Problem Relation Age of Onset  . Diabetes Father   . Hypertension Father   . Colon polyps Father   . Hypertension Mother   . Asthma Mother   . Prostate cancer Neg Hx     Social History Social History  Substance Use Topics  . Smoking status: Never Smoker  . Smokeless tobacco: Never Used  . Alcohol use 0.6 oz/week    1 Cans of beer per week     Comment: socially      Allergies   Review of patient's allergies indicates no known allergies.   Review of Systems Review of Systems  Constitutional: Positive for fatigue. Negative for fever.  Gastrointestinal: Negative for vomiting.  All other systems reviewed and are negative.    Physical Exam Updated Vital Signs BP 125/85 (BP Location: Right Arm)   Pulse 105   Temp 98 F (36.7 C) (Oral)   Resp 20   SpO2 98%   Physical Exam  Constitutional: He is oriented to person, place, and time. He appears well-developed and well-nourished. No distress.  HENT:  Head: Normocephalic and atraumatic.  Eyes: Conjunctivae are normal.  Neck: Neck supple. No tracheal deviation present.  Cardiovascular: Normal rate, regular rhythm and normal heart sounds.   Pulmonary/Chest: Effort normal and breath sounds normal. No respiratory distress.  Abdominal: Soft. He exhibits no distension.  Genitourinary: Rectal exam shows mass (indurated, tender lesion at left perianal area 2cm , no extension into rectum).     Neurological: He is alert and oriented to person, place, and time.  Skin: Skin is warm and dry.  Psychiatric: He has a normal mood and affect.     ED Treatments / Results  Labs (all labs ordered are listed, but only abnormal results are displayed) Labs Reviewed  BASIC METABOLIC PANEL -  Abnormal; Notable for the following:       Result Value   Sodium 122 (*)    Chloride 84 (*)    Glucose, Bld 673 (*)    BUN 27 (*)    Creatinine, Ser 1.42 (*)    GFR calc non Af Amer 55 (*)    All other components within normal limits  CBC - Abnormal; Notable for the following:    WBC 16.1 (*)    All other components within normal limits  URINALYSIS, ROUTINE W REFLEX MICROSCOPIC (NOT AT [facility]) - Abnormal; Notable for the following:    Glucose, UA >1000 (*)  All other components within normal limits  HEPATIC FUNCTION PANEL - Abnormal; Notable for the following:    Total Protein 8.3 (*)    All other components within normal limits  BLOOD GAS, VENOUS - Abnormal; Notable for the following:    pH, Ven 7.467 (*)    pCO2, Ven 34.5 (*)    pO2, Ven 64.1 (*)    All other components within normal limits  URINE MICROSCOPIC-ADD ON - Abnormal; Notable for the following:    Squamous Epithelial / LPF 0-5 (*)    Bacteria, UA RARE (*)    All other components within normal limits  DIFFERENTIAL - Abnormal; Notable for the following:    Neutro Abs 12.9 (*)    All other components within normal limits  CBG MONITORING, ED - Abnormal; Notable for the following:    Glucose-Capillary >600 (*)    All other components within normal limits  CBG MONITORING, ED - Abnormal; Notable for the following:    Glucose-Capillary 366 (*)    All other components within normal limits  CBG MONITORING, ED - Abnormal; Notable for the following:    Glucose-Capillary 293 (*)    All other components within normal limits  LIPASE, BLOOD  CBG MONITORING, ED    EKG  EKG Interpretation  Date/Time:  Wednesday January 21 2016 20:33:26 EDT Ventricular Rate:  105 PR Interval:    QRS Duration: 98 QT Interval:  340 QTC Calculation: 450 R Axis:   -69 Text Interpretation:  Sinus tachycardia Probable left ventricular hypertrophy Inferior infarct, old Anterior Q waves, possibly due to LVH Since last tracing rate faster  Otherwise no significant change Confirmed by Kateria Cutrona MD, Levii Hairfield ([id number]) on 01/21/2016 9:21:44 PM       Radiology No results found.  Procedures Procedures (including critical care time)  INCISION AND DRAINAGE Performed by: Leo Grosser Consent: Verbal consent obtained. Risks and benefits: risks, benefits and alternatives were discussed Type: abscess  Body area: right perirectal area  Anesthesia: local infiltration  Incision was made with a scalpel.  Local anesthetic: lidocaine 1% wo epinephrine  Anesthetic total: 5 ml  Complexity: complex Blunt dissection to break up loculations  Drainage: purulent  Drainage amount: moderate  Packing material: none  Patient tolerance: Patient tolerated the procedure well with no immediate complications.    Medications Ordered in ED Medications  ondansetron (ZOFRAN) injection 4 mg (not administered)  sodium chloride 0.9 % bolus 1,000 mL (not administered)    And  sodium chloride 0.9 % bolus 1,000 mL (0 mLs Intravenous Stopped 01/21/16 2158)  insulin aspart (novoLOG) injection 10 Units (10 Units Intravenous Given 01/21/16 2209)  lidocaine (XYLOCAINE) 1 % (with pres) injection 10 mL (10 mLs Infiltration Given 01/21/16 2213)  sodium chloride 0.9 % bolus 1,000 mL (1,000 mLs Intravenous New Bag/Given 01/21/16 2214)  oxyCODONE (Oxy IR/ROXICODONE) immediate release tablet 5 mg (5 mg Oral Given 01/21/16 2212)     Initial Impression / Assessment and Plan / ED Course  I have reviewed the triage vital signs and the nursing notes.  Pertinent labs & imaging results that were available during my care of the patient were reviewed by me and considered in my medical decision making (see chart for details).  Clinical Course    55 y.o. male presents with Malaise and hyperglycemia over the course of the day. He has not been keeping track of his blood sugar because he has been having rectal pain over the last several days. He has a notable rectal  abscess  which may be precipitating his acute hyperglycemia. No anion gap and no acidosis to suggest development of DKA and no ketones in urine. Hyperglycemia and dehydration is clinically evident with mild AKI but the patient is mentating appropriately. He was given IV insulin and multiple boluses of fluid with appropriate response of glucose. I recommended he follow up with the primary care physician for recheck of creatinine in the next 2 days.  The abscess was drained as above with good relief of pressure. Patient will be given stool softeners for home and recommended aggressive oral rehydration therapy and strict compliance with insulin regimen.  Final Clinical Impressions(s) / ED Diagnoses   Final diagnoses:  Perirectal abscess  Hyperglycemia without ketosis  AKI (acute kidney injury) ([place])  Dehydration    New Prescriptions New Prescriptions   No medications on file     Leo Grosser, MD 01/22/16 [phone]

## 2016-01-21 NOTE — ED Triage Notes (Signed)
PT states that he got nauseous today at work and felt like his blood sugar was low. Pt  Checked it at home and it was 595. Pt also complaining of rectal pain. States that he thinks it is a hemorrhoid. Pt also complaining of hand and arm cramping. A&Ox4. Ambulatory.

## 2016-01-21 NOTE — ED Notes (Signed)
Pt complains of rectal pain that has been ongoing for a week or more.  Today worked as usual, states didn't eat as much but did drink water and a lemonade.  Started feeling odd and checked his blood sugar which was 595 at home.  Pt experiencing cramping in extremities.

## 2016-01-22 LAB — CBG MONITORING, ED: GLUCOSE-CAPILLARY: 293 mg/dL — AB (ref 65–99)

## 2016-01-22 NOTE — ED Provider Notes (Signed)
  Physical Exam  BP 116/78 (BP Location: Left Arm)   Pulse 87   Temp 98 F (36.7 C) (Oral)   Resp 21   SpO2 100%   Physical Exam  ED Course  Procedures  MDM Sugar down to 290. Feels better. Eager to go home. Discharge.       Davonna Belling, MD 01/22/16 [phone]

## 2016-01-26 ENCOUNTER — Other Ambulatory Visit: Payer: Self-pay | Admitting: Physician Assistant

## 2016-01-26 ENCOUNTER — Other Ambulatory Visit: Payer: Self-pay | Admitting: Family Medicine

## 2016-01-27 ENCOUNTER — Other Ambulatory Visit: Payer: Self-pay | Admitting: Family Medicine

## 2016-01-27 DIAGNOSIS — I10 Essential (primary) hypertension: Secondary | ICD-10-CM

## 2016-03-05 ENCOUNTER — Other Ambulatory Visit: Payer: Self-pay | Admitting: Internal Medicine

## 2016-03-11 ENCOUNTER — Other Ambulatory Visit: Payer: Self-pay | Admitting: Emergency Medicine

## 2016-03-11 ENCOUNTER — Other Ambulatory Visit: Payer: Self-pay | Admitting: Family Medicine

## 2016-03-11 ENCOUNTER — Other Ambulatory Visit: Payer: Self-pay | Admitting: Internal Medicine

## 2016-04-02 ENCOUNTER — Telehealth: Payer: Self-pay

## 2016-04-02 NOTE — Telephone Encounter (Signed)
Pt has an appointment set up for 04/08/16 for a med refill, until then he would like a temporary refill on his metoprolol (LOPRESSOR) 100 MG tablet [id number]. Pharmacy:  [facility] [place], [address] AT [address]. Please advise at [phone]

## 2016-04-02 NOTE — Telephone Encounter (Signed)
Pt called to schedule apt with Dr Carlota Raspberry for Thursday 11/16. Requesting a temp refill on  between now and then if possible. States that he could not schedule any earlier due to work. Please advise.

## 2016-04-05 MED ORDER — METOPROLOL TARTRATE 100 MG PO TABS: ORAL_TABLET | ORAL | 0 refills | Status: DC

## 2016-04-05 NOTE — Telephone Encounter (Signed)
Notified pt that I sent in a month to local pharm. Pt will see [place] Thurs for appt.

## 2016-04-05 NOTE — Telephone Encounter (Signed)
Duplicate message. 

## 2016-04-07 ENCOUNTER — Other Ambulatory Visit: Payer: Self-pay | Admitting: Internal Medicine

## 2016-04-07 ENCOUNTER — Other Ambulatory Visit: Payer: Self-pay | Admitting: Physician Assistant

## 2016-04-08 ENCOUNTER — Encounter: Payer: Self-pay | Admitting: Family Medicine

## 2016-04-08 ENCOUNTER — Ambulatory Visit (INDEPENDENT_AMBULATORY_CARE_PROVIDER_SITE_OTHER): Payer: Managed Care, Other (non HMO) | Admitting: Family Medicine

## 2016-04-08 VITALS — BP 122/78 | HR 96 | Temp 97.8°F | Resp 16 | Ht 67.75 in | Wt 197.0 lb

## 2016-04-08 DIAGNOSIS — R7989 Other specified abnormal findings of blood chemistry: Secondary | ICD-10-CM | POA: Diagnosis not present

## 2016-04-08 DIAGNOSIS — E162 Hypoglycemia, unspecified: Secondary | ICD-10-CM | POA: Diagnosis not present

## 2016-04-08 DIAGNOSIS — Z91199 Patient's noncompliance with other medical treatment and regimen due to unspecified reason: Secondary | ICD-10-CM

## 2016-04-08 DIAGNOSIS — Z794 Long term (current) use of insulin: Secondary | ICD-10-CM

## 2016-04-08 DIAGNOSIS — K219 Gastro-esophageal reflux disease without esophagitis: Secondary | ICD-10-CM | POA: Diagnosis not present

## 2016-04-08 DIAGNOSIS — K611 Rectal abscess: Secondary | ICD-10-CM | POA: Diagnosis not present

## 2016-04-08 DIAGNOSIS — E1142 Type 2 diabetes mellitus with diabetic polyneuropathy: Secondary | ICD-10-CM | POA: Diagnosis not present

## 2016-04-08 DIAGNOSIS — I10 Essential (primary) hypertension: Secondary | ICD-10-CM

## 2016-04-08 DIAGNOSIS — Z9119 Patient's noncompliance with other medical treatment and regimen: Secondary | ICD-10-CM | POA: Diagnosis not present

## 2016-04-08 LAB — POCT CBC
Granulocyte percent: 55.4 %G (ref 37–80)
HEMATOCRIT: 43.9 % (ref 43.5–53.7)
Hemoglobin: 15.3 g/dL (ref 14.1–18.1)
LYMPH, POC: 4.3 — AB (ref 0.6–3.4)
MCH, POC: 30.8 pg (ref 27–31.2)
MCHC: 34.7 g/dL (ref 31.8–35.4)
MCV: 88.6 fL (ref 80–97)
MID (cbc): 0.9 (ref 0–0.9)
MPV: 6.3 fL (ref 0–99.8)
POC GRANULOCYTE: 6.4 (ref 2–6.9)
POC LYMPH %: 37.2 % (ref 10–50)
POC MID %: 7.4 %M (ref 0–12)
Platelet Count, POC: 326 10*3/uL (ref 142–424)
RBC: 4.96 M/uL (ref 4.69–6.13)
RDW, POC: 14.1 %
WBC: 11.5 10*3/uL — AB (ref 4.6–10.2)

## 2016-04-08 LAB — BASIC METABOLIC PANEL WITH GFR
BUN: 13 mg/dL (ref 7–25)
CHLORIDE: 99 mmol/L (ref 98–110)
CO2: 29 mmol/L (ref 20–31)
CREATININE: 0.66 mg/dL — AB (ref 0.70–1.33)
Calcium: 9.6 mg/dL (ref 8.6–10.3)
GFR, Est African American: >89 mL/min (ref >=60)
Glucose, Bld: 58 mg/dL — ABNORMAL LOW (ref 65–99)
Potassium: 3.2 mmol/L — ABNORMAL LOW (ref 3.5–5.3)
SODIUM: 138 mmol/L (ref 135–146)

## 2016-04-08 LAB — GLUCOSE, POCT (MANUAL RESULT ENTRY)
POC GLUCOSE: 56 mg/dL — AB (ref 70–99)
POC GLUCOSE: 192 mg/dL — AB (ref 70–99)

## 2016-04-08 LAB — POCT GLYCOSYLATED HEMOGLOBIN (HGB A1C): Hemoglobin A1C: 14

## 2016-04-08 MED ORDER — HYDROCHLOROTHIAZIDE 25 MG PO TABS: 25.0000 mg | ORAL_TABLET | Freq: Every day | ORAL | 0 refills | Status: DC

## 2016-04-08 MED ORDER — PANTOPRAZOLE SODIUM 40 MG PO TBEC: DELAYED_RELEASE_TABLET | ORAL | 0 refills | Status: DC

## 2016-04-08 MED ORDER — DOXYCYCLINE HYCLATE 100 MG PO TABS: 100.0000 mg | ORAL_TABLET | Freq: Two times a day (BID) | ORAL | 0 refills | Status: DC

## 2016-04-08 NOTE — Patient Instructions (Addendum)
It is very important that you call your endocrinologist to reschedule appointment. As we discussed I have concerns with your uncontrolled diabetes and complications to your health.   Please follow-up to discuss Adderall further, as I do not feel comfortable prescribing that medication until we determine control of other medical problems. You should have enough other med refills until repeat office visit within the next month.  You appear to have a persistent perirectal abscess or perianal infection. This needs to be followed up with a general surgeon, but wound healing may be affected by uncontrolled blood sugar. For now start antibiotic twice per day, recheck in 48 hours for repeat exam and blood count. I will also refer you to surgeon hopefully to be seen Monday.   If any increased swelling,  redness or firmness of the area, fevers, chills, or other worsening symptoms prior to evaluation in 2 days, proceed to the emergency room.  Your blood sugar was low in the office likely from not having anything to eat after you give yourself insulin earlier. Make sure you eat when you're taking that insulin.  make sure you are checking your blood sugar every hour or 2 tonight. If it drops low again I do recommend going to the emergency room or calling 911  Perianal Abscess An abscess is an infected area that is filled with pus. A perianal abscess occurs in the perineum, which is the area between the anus and the scrotum in males and between the anus and the vagina in females. Perianal abscesses can vary in size. Without treatment, a perianal abscess can become larger and cause other problems. What are the causes? This condition is caused by:  Waste from damaged or dead tissue (debris) that plugs up glands in the perineum. When this happens, an abscess may form.  Infections of the perineum. What are the signs or symptoms? Common symptoms of this condition include:  Swelling and redness in the area of the  abscess. The redness may go beyond the abscess and appear as a red streak on the skin.  Pain in the area of the abscess, including pain when sitting, walking, or passing stool. Other possible symptoms include:  A visible, painful lump, or a lump that can be felt when touched.  Bleeding or pus-like discharge from the area.  Fever.  General weakness. How is this diagnosed? This condition is diagnosed based on your medical history and a physical exam of the affected area.  This may involve examining the rectal area with a gloved hand (digital rectal exam).  Sometimes, the health care provider needs to look into the rectum using a probe or a scope.  For women, it may require a careful vaginal exam. How is this treated? Treatment for this condition may include:  Making a cut (incision) in the abscess to drain the pus. This can sometimes be done in your health care provider's office or an emergency department after you are given medicine to numb the area (local anesthetic).  Surgery to drain the abscess. This is for larger or deeper abscesses.  Antibiotic medicines, if there is infection in the surrounding tissue (cellulitis).  Having gauze packed into the abscess to continue draining the area.  Frequent baths in warm water that is deep enough to cover your hips and buttocks (sitz baths). These help the wound heal and they make the abscess less likely to come back. Follow these instructions at home: Medicines  Take over-the-counter and prescription medicines for pain, fever, or discomfort  only as told by your health care provider.  If you were prescribed an antibiotic medicine, use it as told by your health care provider. Do not stop using the antibiotic even if you start to feel better.  Do not drive or use heavy machinery while taking prescription pain medicine. Wound care  Keep the skin around the wound clean and dry. Avoid cleaning the area too much.  Avoid scratching the  wound.  Avoid using colored or perfumed toilet papers.  Take a sitz bath 3-4 times a day and after bowel movements. This will help reduce pain and swelling.  If directed, apply ice to the injured area:  Put ice in a plastic bag.  Place a towel between your skin and the bag.  Leave the ice on for 20 minutes, 2-3 times a day.  Check your incision area every day for signs of infection. Check for:  More redness, swelling, or pain.  More fluid or blood.  Warmth.  Pus or a bad smell. Gauze  If gauze was used in the abscess, follow instructions from your health care provider about removing or changing the gauze. It can usually be removed in 2-3 days.  Wash your hands with soap and water before you remove or change your gauze. If soap and water are not available, use hand sanitizer.  If one or more drains were placed in the abscess cavity, be careful not to pull at them. Your health care provider will tell you how long they need to remain in place. General instructions  Keep all follow-up visits as told by your health care provider. This is important. Contact a health care provider if:  You have trouble passing stool or passing urine.  Your pain or swelling in the affected area does not seem to be getting better.  The gauze packing or the drains come out before the planned time. Get help right away if:  You have problems moving or using your legs.  You have severe or increasing pain.  Your swelling in the affected area suddenly gets worse.  You have a large increase in bleeding or passing of pus.  You have chills or a fever. This information is not intended to replace advice given to you by your health care provider. Make sure you discuss any questions you have with your health care provider. Document Released: 06/16/2006 Document Revised: 11/28/2015 Document Reviewed: 10/20/2015 Elsevier Interactive Patient Education  2017 [employer].        IF you received an  x-ray today, you will receive an invoice from [facility]. Please contact [facility] at [phone] with questions or concerns regarding your invoice.   IF you received labwork today, you will receive an invoice from [employer]. Please contact [facility] at [phone] with questions or concerns regarding your invoice.   Our billing staff will not be able to assist you with questions regarding bills from these companies.  You will be contacted with the lab results as soon as they are available. The fastest way to get your results is to activate your My Chart account. Instructions are located on the last page of this paperwork. If you have not heard from [place] regarding the results in 2 weeks, please contact this office.

## 2016-04-08 NOTE — Progress Notes (Addendum)
By signing my name below, I, Mesha Guinyard, attest that this documentation has been prepared under the direction and in the presence of [name], MD.  Electronically Signed: Verlee Monte, Medical Scribe. 04/08/16. 5:11 PM.  Subjective:    Patient ID: Raymond Pugh, male    DOB: 1961/02/12, 55 y.o.   MRN: [id number]  HPI Chief Complaint  Patient presents with  . Medication Refill    ALL Rxs wil RF beside the medication  . Cyst    per patient near rectum and having problems, sometimes pus comes from it     HPI Comments: Raymond Pugh is a 55 y.o. male who presents to the [facility] for follow-up of multiple concerns.  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 Last visit with me was Dec 2016.  DM: Hx of uncontrolled DM. Followed by endocrinologist Dr. Cruzita Lederer based on labs in July, however labs with her appears to be in March 2017. Based on last visit March 29th, he had a hx of non compliance with appt and prior treatments, but at that visit he was taking his medication as advised. On lantus 46 units QHS, humalog with meals, and metformin 1022m BID. He was advised to return in 3 months with blood sugar log to help determine changes. He was seen by Dr. DEverlene Farrierin July, based on that visit, he had not made his appt with endocrinology but planned on rescheduling; A1c was greater than 14 at that visit. He was most recently seen in the ED Aug 30th for perirectal abscess. Glucose was 673, his creatinine at that time was 1.42, I&D was performed for perrectal abscess. There was no anion gap and no acidosis. He did have mild AKI, he was given IV insulin and multiple boluses of IV fluid and discharge with primary care follow-up within 2 days. Blood sugar at discharge was 290, he had not followed up since that ER visit.  Reports abscess intermittently opens and closes from bowel movements with  occasional purulent drainage from site, and sometimes bloody discharge since 8/30 ED admission; it has been open for the past week. Pt has regular bowel movements and states it is regularly open rather than closed. Reports lows of 70 He checks his blood sugar and states it fluctuates. Pt has been out of metoprolol for over 1.5 weeks. Takes lantus 36-46 units depending on how high his blood sugar is at night, humalog 14 units, and metformin BID.  Pt occasionally misses meals and he'll skip metformin. He mentions he rarely has hypoglycemic episodes. Denies forgetting insulin and metformin for the past week. Denies fevers, chills, constipation, blurry vision, abdominal pain, constipation, diarrhea, nausea, vomiting, polydipsia, dizziness, and HAs. Lab Results  Component Value Date   HGBA1C >14.0 11/24/2015   Lab Results  Component Value Date   MICROALBUR 1.5 11/24/2015   ADD with Anxiety Disorder: He had prev used adderall a few times per week and was cautioned with his hx of DM and risk of heart disease. He also had xanax 13mfor intermittent use. Given #30 of xanax and adderall-xl at the July visit. Still has xanax left as he takes it intermittently. Ran out of adderall last week and only takes it during busy days at work.  HTN: He had been referred to cardiology, evaluated by Dr. SkMarlou Porchan 16th. He had an abnormal EKG so an echocardiogram was ordered. Advised to  start ASA 55m, his echo had EF of 55-60%, mild LDH, nl wall motion, diastolic dysfunction, other deatail by chart, advised to follow-up PRN. He is on altace 161mQD, lopressor 10043mID, HCTZ 49m64m Lab Results  Component Value Date   CREATININE 1.42 (H) 01/21/2016   BP Readings from Last 3 Encounters:  04/08/16 122/78  01/22/16 124/78  11/24/15 120/72   Patient Active Problem List   Diagnosis Date Noted  . Liver abscess 06/24/2014  . Eyelid lesion 05/29/2012  . Obesity 08/25/2010  . BARRETTS ESOPHAGUS 07/03/2010  . Iron  deficiency anemia, unspecified 11/16/2009  . Hyperlipidemia 11/04/2008  . ADHD 08/14/2007  . MOLE 03/13/2007  . Uncontrolled type 2 diabetes mellitus with peripheral angiopathy (HCC)Anderson/20/2008  . Essential hypertension 11/03/2006   Past Medical History:  Diagnosis Date  . ADHD (attention deficit hyperactivity disorder)   . Anemia   . Barrett's esophagus   . Diabetes mellitus   . Hiatal hernia   . Hyperlipemia   . Hypertension   . Myocarditis (HCC)[place]yman h/o mypocarditis, caused cardiomyopathy-- resolved    Past Surgical History:  Procedure Laterality Date  . ABSCESS DRAIN LIVER PERC ([facility]     klebsiella on liver pa states   . EYE SURGERY    . HYDROCELE EXCISION / REPAIR    . radiokeratotomy    . RETINAL DETACHMENT SURGERY     No Known Allergies Prior to Admission medications   Medication Sig Start Date End Date Taking? Authorizing Provider  ALPRAZolam (XANDuanne MoronMG tablet Take 0.5-1 tablets (0.5-1 mg total) by mouth daily as needed (for anxiety). 11/24/15   StevDarlyne Russian  amphetamine-dextroamphetamine (ADDERALL XR) 20 MG 24 hr capsule Take 1 Capsules by mouth every day prn Patient taking differently: Take 20 mg by mouth daily as needed (ahdh). Take 1 Capsules by mouth every day prn 11/24/15   StevDarlyne Russian  BD PEN NEEDLE NANO U/F 32G X 4 MM MISC Use 4 times daily 11/24/15   StevDarlyne Russian  Blood Glucose Monitoring Suppl (ONETHardtnerDevice KIT 1 each by Does not apply route daily. Dx: E11.51 11/24/15   StevDarlyne Russian  docusate sodium (COLACE) 100 MG capsule Take 1 capsule (100 mg total) by mouth every 12 (twelve) hours. 01/21/16   DaniLeo Grosser  glucose blood (ONETOUCH VERIO) test strip Use to test blood sugar 4-6 times daily as instructed. Dx: E11.51 11/24/15   StevDarlyne Russian  HUMALOG KWIKPEN 100 UNIT/ML KiwkPen INJECT 8-12 UNITS INTO THE SKIN THREE TIMES DAILY 04/08/16   CrisPhilemon Kingdom  hydrochlorothiazide (HYDRODIURIL) 25 MG tablet TAKE 1  TABLET(25 MG) BY MOUTH DAILY 01/28/16   StevDarlyne Russian  Insulin Glargine (LANTUS SOLOSTAR) 100 UNIT/ML Solostar Pen Inject 46 Units into the skin daily at 10 pm. 11/24/15   StevDarlyne Russian  insulin lispro (HUMALOG KWIKPEN) 100 UNIT/ML KiwkPen Inject 0.14-0.16 mLs (14-16 Units total) into the skin 3 (three) times daily. Patient taking differently: Inject 14-16 Units into the skin 3 (three) times daily. Take 14-16 units TID Depending on Size of Meal. Pt didn't specify any further. 11/24/15   StevDarlyne Russian  LANTUS SOLOSTAR 100 UNIT/ML Solostar Pen ADMINISTER 36 UNITS UNDER THE SKIN DAILY AT 10 PM 03/12/16   CrisPhilemon Kingdom  lovastatin (MEVACOR) 20 MG tablet Take 1 tablet   at night by mouth. Patient taking differently: Take 20 mg by  mouth at bedtime. Take 1 tablet   at night by mouth. 11/24/15   Darlyne Russian, MD  metFORMIN (GLUCOPHAGE) 1000 MG tablet Take 1 tablet (1,000 mg total) by mouth 2 (two) times daily. 11/24/15   Darlyne Russian, MD  metoprolol (LOPRESSOR) 100 MG tablet TAKE 1 TABLET(100 MG) BY MOUTH TWICE DAILY 04/05/16   Wendie Agreste, MD  [facility] DELICA LANCETS FINE MISC Use to test blood sugar 4-6 times daily as instructed. Dx: E11.51 11/24/15   Darlyne Russian, MD  pantoprazole (PROTONIX) 40 MG tablet TAKE 1 TABLET BY MOUTH EVERY MORNING BEFORE BREAKFAST 03/14/16   Wendie Agreste, MD  ramipril (ALTACE) 10 MG capsule Take 1 capsule (10 mg total) by mouth 2 (two) times daily. 11/24/15   Darlyne Russian, MD   Social History   Social History  . Marital status: Married    Spouse name: N/A  . Number of children: 2  . Years of education: N/A   Occupational History  . student, part time job     [employer], Architect   Social History Main Topics  . Smoking status: Never Smoker  . Smokeless tobacco: Never Used  . Alcohol use 0.6 oz/week    1 Cans of beer per week     Comment: socially   . Drug use: No  . Sexual activity: Not on file     Comment: not asked   Other Topics Concern  .  Not on file   Social History Narrative   2 children + 2 step children ---    Has a part time job, student    Exercise swimming   Review of Systems  Constitutional: Negative for chills and fever.  Eyes: Negative for visual disturbance (blurry).  Gastrointestinal: Positive for anal bleeding (occasional, rectal drainage) and rectal pain. Negative for constipation, diarrhea, nausea and vomiting.  Endocrine: Negative for polydipsia.  Skin: Positive for wound (anus).  Neurological: Negative for dizziness, light-headedness and headaches.   Objective:  Physical Exam  Constitutional: He appears well-developed and well-nourished. No distress.  HENT:  Head: Normocephalic and atraumatic.  Eyes: Conjunctivae are normal.  Neck: Neck supple.  Cardiovascular: Normal rate, regular rhythm and normal heart sounds.  Exam reveals no friction rub.   No murmur heard. Pulmonary/Chest: Effort normal and breath sounds normal. No respiratory distress. He has no wheezes. He has no rales.  Genitourinary:  Genitourinary Comments: Opening just to the right of the anus draining white yellow discharge, but not significant induration or fluctuance of area Guarded rectal exam. Unable to determine of fistula present  Neurological: He is alert.  Skin: Skin is warm and dry.  Psychiatric: He has a normal mood and affect. His behavior is normal.  Nursing note and vitals reviewed.  BP 122/78 (BP Location: Left Arm, Patient Position: Sitting, Cuff Size: Normal)   Pulse 96   Temp 97.8 F (36.6 C) (Oral)   Resp 16   Ht 5' 7.75" (1.721 m)   Wt 197 lb (89.4 kg)   SpO2 97%   BMI 30.18 kg/m    Results for orders placed or performed in visit on 04/08/16  POCT glucose (manual entry)  Result Value Ref Range   POC Glucose 56 (A) 70 - 99 mg/dl  POCT glycosylated hemoglobin (Hb A1C)  Result Value Ref Range   Hemoglobin A1C 14.0   POCT CBC  Result Value Ref Range   WBC 11.5 (A) 4.6 - 10.2 K/uL   Lymph, poc 4.3 (A) 0.6  -  3.4   POC LYMPH PERCENT 37.2 10 - 50 %L   MID (cbc) 0.9 0 - 0.9   POC MID % 7.4 0 - 12 %M   POC Granulocyte 6.4 2 - 6.9   Granulocyte percent 55.4 37 - 80 %G   RBC 4.96 4.69 - 6.13 M/uL   Hemoglobin 15.3 14.1 - 18.1 g/dL   HCT, POC 43.9 43.5 - 53.7 %   MCV 88.6 80 - 97 fL   MCH, POC 30.8 27 - 31.2 pg   MCHC 34.7 31.8 - 35.4 g/dL   RDW, POC 14.1 %   Platelet Count, POC 326 142 - 424 K/uL   MPV 6.3 0 - 99.8 fL   [6:14 PM] Blood sugar was noted at 56. He took 14 units of humalog without eating before coming in today since he didn't suspect he would be in the office as long as he has. When given is results he was feeling hungry and sweaty but denied light-headedness or dizziness. He admits to occasional low blood sugars, but his last one was last month at 70. 15 grams glucose gel given and small amount of sweet tea. We'll monitor for improvement prior to discharge.    Result Value Ref Range   POC Glucose 192 (A) 70 - 99 mg/dl    Assessment & Plan:  Over 40 mins of time with office visit; review of prev records and repeat evaluation/treatment with over 50% counseling.   Raymond Pugh is a 55 y.o. male Perirectal abscess - Plan: POCT CBC, WOUND CULTURE, doxycycline (VIBRA-TABS) 100 MG tablet, Ambulatory referral to General Surgery  - recurrent abscess, but minimal induration in office.  Wound cx obtained, refer to general surgery due to persistent sx's, but discussed uncontrolled DM likely contributor. Start doxycycline, rtc precautions, and recommended recheck in 48 hrs.   History of nonadherence to medical treatment Type 2 diabetes mellitus with diabetic polyneuropathy, with long-term current use of insulin ([place]) - Plan: POCT glucose (manual entry), POCT glycosylated hemoglobin (Hb A1C), POCT glucose (manual entry)  - uncontrolled with prior med nonadherence.  - episode of hypoglycemia in office - treated with glucose.  Improved in office. Advised to not use rapid acting insulin  without eating.   - advised of importance in calling endocrinologist for follow up ASAP.   Elevated serum creatinine - Plan: BASIC METABOLIC PANEL WITH GFR  - repeat BMP  Essential hypertension - Plan: hydrochlorothiazide (HYDRODIURIL) 25 MG tablet  - stable in office. No med changes.   -deferred Adderall Rx until more stable control of diabetes due to stimulant nature of that medication.   Gastroesophageal reflux disease, esophagitis presence not specified - Plan: pantoprazole (PROTONIX) 40 MG tablet  - refilled PPI. Trigger avoidance, use as needed.   Hypoglycemia - Plan: POCT glucose (manual entry)  - as above, avoidance measures discussed.   Meds ordered this encounter  Medications  . pantoprazole (PROTONIX) 40 MG tablet    Sig: TAKE 1 TABLET BY MOUTH EVERY MORNING BEFORE BREAKFAST    Dispense:  30 tablet    Refill:  0  . hydrochlorothiazide (HYDRODIURIL) 25 MG tablet    Sig: Take 1 tablet (25 mg total) by mouth daily.    Dispense:  30 tablet    Refill:  0  . doxycycline (VIBRA-TABS) 100 MG tablet    Sig: Take 1 tablet (100 mg total) by mouth 2 (two) times daily.    Dispense:  20 tablet    Refill:  0  Patient Instructions    It is very important that you call your endocrinologist to reschedule appointment. As we discussed I have concerns with your uncontrolled diabetes and complications to your health.   Please follow-up to discuss Adderall further, as I do not feel comfortable prescribing that medication until we determine control of other medical problems. You should have enough other med refills until repeat office visit within the next month.  You appear to have a persistent perirectal abscess or perianal infection. This needs to be followed up with a general surgeon, but wound healing may be affected by uncontrolled blood sugar. For now start antibiotic twice per day, recheck in 48 hours for repeat exam and blood count. I will also refer you to surgeon hopefully to be  seen Monday.   If any increased swelling,  redness or firmness of the area, fevers, chills, or other worsening symptoms prior to evaluation in 2 days, proceed to the emergency room.  Your blood sugar was low in the office likely from not having anything to eat after you give yourself insulin earlier. Make sure you eat when you're taking that insulin.  make sure you are checking your blood sugar every hour or 2 tonight. If it drops low again I do recommend going to the emergency room or calling 911  Perianal Abscess An abscess is an infected area that is filled with pus. A perianal abscess occurs in the perineum, which is the area between the anus and the scrotum in males and between the anus and the vagina in females. Perianal abscesses can vary in size. Without treatment, a perianal abscess can become larger and cause other problems. What are the causes? This condition is caused by:  Waste from damaged or dead tissue (debris) that plugs up glands in the perineum. When this happens, an abscess may form.  Infections of the perineum. What are the signs or symptoms? Common symptoms of this condition include:  Swelling and redness in the area of the abscess. The redness may go beyond the abscess and appear as a red streak on the skin.  Pain in the area of the abscess, including pain when sitting, walking, or passing stool. Other possible symptoms include:  A visible, painful lump, or a lump that can be felt when touched.  Bleeding or pus-like discharge from the area.  Fever.  General weakness. How is this diagnosed? This condition is diagnosed based on your medical history and a physical exam of the affected area.  This may involve examining the rectal area with a gloved hand (digital rectal exam).  Sometimes, the health care provider needs to look into the rectum using a probe or a scope.  For women, it may require a careful vaginal exam. How is this treated? Treatment for this  condition may include:  Making a cut (incision) in the abscess to drain the pus. This can sometimes be done in your health care provider's office or an emergency department after you are given medicine to numb the area (local anesthetic).  Surgery to drain the abscess. This is for larger or deeper abscesses.  Antibiotic medicines, if there is infection in the surrounding tissue (cellulitis).  Having gauze packed into the abscess to continue draining the area.  Frequent baths in warm water that is deep enough to cover your hips and buttocks (sitz baths). These help the wound heal and they make the abscess less likely to come back. Follow these instructions at home: Medicines  Take over-the-counter and prescription  medicines for pain, fever, or discomfort only as told by your health care provider.  If you were prescribed an antibiotic medicine, use it as told by your health care provider. Do not stop using the antibiotic even if you start to feel better.  Do not drive or use heavy machinery while taking prescription pain medicine. Wound care  Keep the skin around the wound clean and dry. Avoid cleaning the area too much.  Avoid scratching the wound.  Avoid using colored or perfumed toilet papers.  Take a sitz bath 3-4 times a day and after bowel movements. This will help reduce pain and swelling.  If directed, apply ice to the injured area:  Put ice in a plastic bag.  Place a towel between your skin and the bag.  Leave the ice on for 20 minutes, 2-3 times a day.  Check your incision area every day for signs of infection. Check for:  More redness, swelling, or pain.  More fluid or blood.  Warmth.  Pus or a bad smell. Gauze  If gauze was used in the abscess, follow instructions from your health care provider about removing or changing the gauze. It can usually be removed in 2-3 days.  Wash your hands with soap and water before you remove or change your gauze. If soap and  water are not available, use hand sanitizer.  If one or more drains were placed in the abscess cavity, be careful not to pull at them. Your health care provider will tell you how long they need to remain in place. General instructions  Keep all follow-up visits as told by your health care provider. This is important. Contact a health care provider if:  You have trouble passing stool or passing urine.  Your pain or swelling in the affected area does not seem to be getting better.  The gauze packing or the drains come out before the planned time. Get help right away if:  You have problems moving or using your legs.  You have severe or increasing pain.  Your swelling in the affected area suddenly gets worse.  You have a large increase in bleeding or passing of pus.  You have chills or a fever. This information is not intended to replace advice given to you by your health care provider. Make sure you discuss any questions you have with your health care provider. Document Released: 06/16/2006 Document Revised: 11/28/2015 Document Reviewed: 10/20/2015 Elsevier Interactive Patient Education  2017 [employer].        IF you received an x-ray today, you will receive an invoice from [facility]. Please contact [facility] at [phone] with questions or concerns regarding your invoice.   IF you received labwork today, you will receive an invoice from [employer]. Please contact [facility] at [phone] with questions or concerns regarding your invoice.   Our billing staff will not be able to assist you with questions regarding bills from these companies.  You will be contacted with the lab results as soon as they are available. The fastest way to get your results is to activate your My Chart account. Instructions are located on the last page of this paperwork. If you have not heard from [place] regarding the results in 2 weeks, please contact  this office.        I personally performed the services described in this documentation, which was scribed in my presence. The recorded information has been reviewed and considered, and addended by me as needed.  Signed,   [name], MD [facility].  04/12/16 10:25 PM

## 2016-04-09 ENCOUNTER — Other Ambulatory Visit: Payer: Self-pay | Admitting: Internal Medicine

## 2016-04-09 ENCOUNTER — Telehealth: Payer: Self-pay

## 2016-04-09 NOTE — Telephone Encounter (Signed)
Advised patient that we can schedule her with a [facility] provider if she does not want to see [facility].  She states that this will be fine.  She requested that we leave her a detailed message as she will be working on the phones this afternoon.  I reported this information to Miranda.  She will make the referral and call patient back.

## 2016-04-09 NOTE — Telephone Encounter (Signed)
Renay left VM with patient regarding referral for general surgery. Information was sent to [facility] in [facility], the office affiliated with the surgical center located on [address] (the pts requested location). Their office closed at 12:30 today so we were unable to schedule an apt for them. Will contact Monday morning to ensure that they received the faxed information and reach out for scheduling.

## 2016-04-09 NOTE — Telephone Encounter (Signed)
LMVM for patient stating that the surgery consult with [facility] on [address] in [place] could not be scheduled today due to their office closing at 11:30 today.  Advised patient that we would fax over the referral notes for her husband to them and if she is not wanting to wait for them to call her, then she could call them on Monday.  I left their phone number on her voice mail.  I further apologized to her for this taking so long and if she needs anything else from her do not hesitate to call back.  Chrys Racer will fax all information to them today.

## 2016-04-09 NOTE — Telephone Encounter (Signed)
Angie relayed a msg to me stating that the patient called about their referral and worries about insurance. I called [facility] and they stated that they do file [employer], which is what the patient has. Left VM stating to call back if they have any other questions and speak to me or leave a detailed message with whomever answers so that I can respond when back in the office.

## 2016-04-10 ENCOUNTER — Ambulatory Visit (INDEPENDENT_AMBULATORY_CARE_PROVIDER_SITE_OTHER): Payer: Managed Care, Other (non HMO) | Admitting: Family Medicine

## 2016-04-10 VITALS — BP 108/74 | HR 71 | Temp 97.7°F | Resp 14 | Ht 67.75 in | Wt 197.0 lb

## 2016-04-10 DIAGNOSIS — K611 Rectal abscess: Secondary | ICD-10-CM

## 2016-04-10 DIAGNOSIS — Z794 Long term (current) use of insulin: Secondary | ICD-10-CM

## 2016-04-10 DIAGNOSIS — E1142 Type 2 diabetes mellitus with diabetic polyneuropathy: Secondary | ICD-10-CM

## 2016-04-10 NOTE — Patient Instructions (Signed)
     IF you received an x-ray today, you will receive an invoice from [facility]. Please contact [facility] at [phone] with questions or concerns regarding your invoice.   IF you received labwork today, you will receive an invoice from [facility]. Please contact [facility] at [phone] with questions or concerns regarding your invoice.   Our billing staff will not be able to assist you with questions regarding bills from these companies.  You will be contacted with the lab results as soon as they are available. The fastest way to get your results is to activate your My Chart account. Instructions are located on the last page of this paperwork. If you have not heard from us regarding the results in 2 weeks, please contact this office.      

## 2016-04-10 NOTE — Progress Notes (Signed)
Chief Complaint  Patient presents with  . Follow-up    Perirectal abcess    HPI  Hypoglycemia Pt reports that he checked his blood glucose prior to coming in to the office this morning his sugar was 200 fasting He states that after taking his insulin he felt better. He has eaten and feels well.  He had a hypoglycemic episode last visit.   He is here for perirectal abscess Pt reports that he had surgery for perirectal abscess in August 2017 He states that he saw Dr. Nyoka Cowden who reassessed and gave him Doxycyline     Past Medical History:  Diagnosis Date  . ADHD (attention deficit hyperactivity disorder)   . Anemia   . Barrett's esophagus   . Diabetes mellitus   . Hiatal hernia   . Hyperlipemia   . Hypertension   . Myocarditis ([place])    h/o mypocarditis, caused cardiomyopathy-- resolved     Current Outpatient Prescriptions  Medication Sig Dispense Refill  . ALPRAZolam (XANAX) 1 MG tablet Take 0.5-1 tablets (0.5-1 mg total) by mouth daily as needed (for anxiety). 30 tablet 0  . amphetamine-dextroamphetamine (ADDERALL XR) 20 MG 24 hr capsule Take 1 Capsules by mouth every day prn (Patient taking differently: Take 20 mg by mouth daily as needed (ahdh). Take 1 Capsules by mouth every day prn) 30 capsule 0  . BD PEN NEEDLE NANO U/F 32G X 4 MM MISC Use 4 times daily 400 each 3  . Blood Glucose Monitoring Suppl (ONETOUCH VERIO FLEX SYSTEM) w/Device KIT 1 each by Does not apply route daily. Dx: E11.51 1 kit 0  . docusate sodium (COLACE) 100 MG capsule Take 1 capsule (100 mg total) by mouth every 12 (twelve) hours. 60 capsule 0  . doxycycline (VIBRA-TABS) 100 MG tablet Take 1 tablet (100 mg total) by mouth 2 (two) times daily. 20 tablet 0  . glucose blood (ONETOUCH VERIO) test strip Use to test blood sugar 4-6 times daily as instructed. Dx: E11.51 180 each 3  . HUMALOG KWIKPEN 100 UNIT/ML KiwkPen INJECT 8-12 UNITS INTO THE SKIN THREE TIMES DAILY 15 mL 0  . hydrochlorothiazide  (HYDRODIURIL) 25 MG tablet Take 1 tablet (25 mg total) by mouth daily. 30 tablet 0  . Insulin Glargine (LANTUS SOLOSTAR) 100 UNIT/ML Solostar Pen Inject 46 Units into the skin daily at 10 pm. 45 mL 1  . insulin lispro (HUMALOG KWIKPEN) 100 UNIT/ML KiwkPen Inject 0.14-0.16 mLs (14-16 Units total) into the skin 3 (three) times daily. (Patient taking differently: Inject 14-16 Units into the skin 3 (three) times daily. Take 14-16 units TID Depending on Size of Meal. Pt didn't specify any further.) 45 mL 1  . LANTUS SOLOSTAR 100 UNIT/ML Solostar Pen ADMINISTER 36 UNITS UNDER THE SKIN DAILY AT 10 PM 15 mL 0  . lovastatin (MEVACOR) 20 MG tablet Take 1 tablet (20 mg total) by mouth at bedtime. 90 tablet 2  . metFORMIN (GLUCOPHAGE) 1000 MG tablet Take 1 tablet (1,000 mg total) by mouth 2 (two) times daily. 180 tablet 3  . metoprolol (LOPRESSOR) 100 MG tablet TAKE 1 TABLET(100 MG) BY MOUTH TWICE DAILY 60 tablet 0  . ONETOUCH DELICA LANCETS FINE MISC Use to test blood sugar 4-6 times daily as instructed. Dx: E11.51 200 each 3  . pantoprazole (PROTONIX) 40 MG tablet TAKE 1 TABLET BY MOUTH EVERY MORNING BEFORE BREAKFAST 30 tablet 0  . ramipril (ALTACE) 10 MG capsule Take 1 capsule (10 mg total) by mouth 2 (two) times daily. [place]  capsule 3   No current facility-administered medications for this visit.     Allergies: No Known Allergies  Past Surgical History:  Procedure Laterality Date  . ABSCESS DRAIN LIVER PERC ([place] HX)     klebsiella on liver pa states   . EYE SURGERY    . HYDROCELE EXCISION / REPAIR    . radiokeratotomy    . RETINAL DETACHMENT SURGERY      Social History   Social History  . Marital status: Married    Spouse name: N/A  . Number of children: 2  . Years of education: N/A   Occupational History  . student, part time job     [employer], Architect   Social History Main Topics  . Smoking status: Never Smoker  . Smokeless tobacco: Never Used  . Alcohol use 0.6 oz/week    1 Cans  of beer per week     Comment: socially   . Drug use: No  . Sexual activity: Not Asked     Comment: not asked   Other Topics Concern  . None   Social History Narrative   2 children + 2 step children ---    Has a part time job, Ship broker    Exercise swimming    Review of Systems  Constitutional: Negative for chills, fever and malaise/fatigue.  Gastrointestinal: Negative for abdominal pain, blood in stool, diarrhea, nausea and vomiting.  Skin: Negative for itching and rash.    Objective: Vitals:   04/10/16 0956  BP: 108/74  Pulse: 71  Resp: 14  Temp: 97.7 F (36.5 C)  TempSrc: Oral  SpO2: 98%  Weight: 197 lb (89.4 kg)  Height: 5' 7.75" (1.721 m)    Physical Exam  Constitutional: He appears well-developed and well-nourished.  HENT:  Head: Normocephalic and atraumatic.  Eyes: Conjunctivae and EOM are normal.  Cardiovascular: Normal rate, regular rhythm and normal heart sounds.   Pulmonary/Chest: Effort normal and breath sounds normal. No respiratory distress.  Rectal exam: normal tone At 3 o'clock there is a small abscess with minimal purulent drainage Non-bloody, no fluctuance or erythema  Assessment and Plan Kensley was seen today for follow-up.  Diagnoses and all orders for this visit:  Type 2 diabetes mellitus with diabetic polyneuropathy, with long-term current use of insulin ([place])- hypoglycemia resolved, continue current diabetes plan  Perirectal abscess- continue doxycycline, follow up with surgery referral as instructed Discussed that he should be evaluated for a fistula since the abscess has been there since 12/2015     [place]

## 2016-04-11 LAB — WOUND CULTURE
GRAM STAIN: NONE SEEN
GRAM STAIN: NONE SEEN
GRAM STAIN: NONE SEEN

## 2016-05-05 ENCOUNTER — Other Ambulatory Visit: Payer: Self-pay | Admitting: Family Medicine

## 2016-05-05 ENCOUNTER — Other Ambulatory Visit: Payer: Self-pay | Admitting: Internal Medicine

## 2016-05-16 ENCOUNTER — Other Ambulatory Visit: Payer: Self-pay | Admitting: Internal Medicine

## 2016-05-31 ENCOUNTER — Ambulatory Visit (INDEPENDENT_AMBULATORY_CARE_PROVIDER_SITE_OTHER): Payer: Managed Care, Other (non HMO) | Admitting: Internal Medicine

## 2016-05-31 ENCOUNTER — Encounter: Payer: Self-pay | Admitting: Internal Medicine

## 2016-05-31 VITALS — BP 130/74 | HR 74 | Ht 68.0 in | Wt 204.0 lb

## 2016-05-31 DIAGNOSIS — IMO0002 Reserved for concepts with insufficient information to code with codable children: Secondary | ICD-10-CM

## 2016-05-31 DIAGNOSIS — E1165 Type 2 diabetes mellitus with hyperglycemia: Secondary | ICD-10-CM

## 2016-05-31 DIAGNOSIS — E1151 Type 2 diabetes mellitus with diabetic peripheral angiopathy without gangrene: Secondary | ICD-10-CM

## 2016-05-31 MED ORDER — INSULIN REGULAR HUMAN 100 UNIT/ML IJ SOLN: 16.0000 [IU] | Freq: Three times a day (TID) | INTRAMUSCULAR | 5 refills | Status: DC

## 2016-05-31 MED ORDER — "INSULIN SYRINGE-NEEDLE U-100 31G X 15/64"" 0.5 ML MISC": 5 refills | Status: DC

## 2016-05-31 NOTE — Progress Notes (Signed)
Subjective:     Patient ID: Raymond Pugh, male   DOB: 1960-09-01, 56 y.o.   MRN: [id number]  HPI Raymond Pugh is a pleasant 56 y.o. man,returning for f/u for management of DM2, dx 2008, uncontrolled, insulin-dependent, with complications (peripheral neuropathy). Last visit 9 mo ago.  He is noncompliant with appts and recommended tx's and not checking sugars.   He had sugars of 600s in 12/2015 as he felt low and started to drink juice without checking his sugars!  Last A hemoglobin A1c: Lab Results  Component Value Date   HGBA1C 14.0 04/08/2016   HGBA1C >14.0 11/24/2015   HGBA1C 9.6 08/20/2015   He is now on: - Lantus 36-46 units at bedtime - Humalog 16-18 units before the meals (2-3x a day) - Metformin 1000 mg 2x a day Stopped Glipizide. No lows. He has hypoglycemia awareness at <90.  He checks his sugars 2-3x a day - no log and no meter: - a.m.:  232-300 >> 146-263, 368 >> 108, 118-120s, 220 (if large meal at night) >> 70, 170-200, 270 (when forgets Lantus) - 2h after b'fast: n/c >> 230 - before lunch: 120 >> 269, 334 >> 145-352, 422 >> 120-220 >> 60 (took Humalog after eating), 170 - 2h after lunch: 289, 300 >> 177-324 >> n/c - before dinner: 217 >> 249, 390 >> 211-398 >> 120s- rarely 200s >> 170-200 - after dinner: 233-275 >> 284-405 >> 187-431 >> 200s >> 200s Highest: 431 >> 200s >> 600 x1, Lowest: 139 >> 108 >> 60 before lunch.  - No kidney dysfunction.  Last BUN/Cr: Lab Results  Component Value Date   BUN 13 04/08/2016   Lab Results  Component Value Date   CREATININE 0.66 (L) 04/08/2016  On Ramipril.  - last lipids: Lab Results  Component Value Date   CHOL 157 11/24/2015   HDL 44 11/24/2015   LDLCALC 83 11/24/2015   TRIG 148 11/24/2015   CHOLHDL 3.6 11/24/2015  On Lovastatin. - Last eye exam: 12/2014, [facility]. He has had retinal detachment surgery.   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  Review of Systems Constitutional: no weight gain, no  fatigue, no subjective hyperthermia/hypothermia Eyes: no blurry vision, no xerophthalmia ENT: no sore throat, no nodules palpated in throat, no dysphagia/odynophagia, no hoarseness Cardiovascular: no CP/SOB/palpitations/leg swelling Respiratory: no cough/SOB Gastrointestinal: no N/V/D/C Musculoskeletal: no muscle/joint aches Skin: no rashes Neurological: no tremors/numbness/tingling/dizziness  I reviewed pt's medications, allergies, PMH, social hx, family hx, and changes were documented in the history of present illness. Otherwise, unchanged from my initial visit note.  Objective:   Physical Exam BP 130/74 (BP Location: Left Arm, Patient Position: Sitting)   Pulse 74   Ht 5\' 8"  (1.727 m)   Wt 204 lb (92.5 kg)   SpO2 96%   BMI 31.02 kg/m  Body mass index is 31.02 kg/m. Wt Readings from Last 3 Encounters:  05/31/16 204 lb (92.5 kg)  04/10/16 197 lb (89.4 kg)  04/08/16 197 lb (89.4 kg)  Constitutional: overweight, in NAD Eyes: PERRLA, , anisocoria - surgical pupil on the left, EOMI, no exophthalmos ENT: moist mucous membranes, no thyromegaly, no cervical lymphadenopathy; + dysphagia (he has Barrett's esophagus) Cardiovascular: RRR, No MRG Respiratory: CTA B Gastrointestinal: abdomen soft, NT, ND, BS+ Musculoskeletal: no deformities, strength intact in all 4 Skin: moist, warm, no rashes Neurological: no tremor with outstretched hands, DTR normal in all 4   Assessment:     1.  DM2, uncontrolled, insulin-dependent, with complications -  peripheral neuropathy  Plan:     1. Pt with long-standing, uncontrolled, diabetes, returning after a long absence again. He is now on basal-bolus insulin regimen. He had initial improvement of his sugars after adding Humalog but then HbA1c started to increase (last 14%) due to noncompliance with medications, checking sugars and office visits.  As of now, he does not bring a sugar log or a meter, but his sugars are mostly high, with an occasional  mild low due to taking his insulin after he eats. I strongly advised him to move all the insulin before he eats. He has a large Lantus dose variation, between 36 and 46 units. I advised him to to keep the dose at 42 units. Will also need to switch from Humalog to regular insulin due to price. He agrees to use the vials instead of pens. We'll also increase the mealtime insulin doses.  - I advised him to: Patient Instructions  Please decrease Lantus to 42 units.  Please change from Humalog to R insulin - inject this insulin 30 min before the meal.  - 16 units before a smaller meal - 18 units before a regular meal - 20 units before a larger meal  Continue Metformin 1000 mg 2x a day  Please return in 1.5 months with your sugar log.   - UTD with eye exams - RTC in 1.5 month with his sugar log  Raymond Kingdom, MD PhD [facility] Endocrinology

## 2016-05-31 NOTE — Patient Instructions (Addendum)
Please decrease Lantus to 42 units.  Please change from Humalog to R insulin - inject this insulin 30 min before the meal.  - 16 units before a smaller meal - 18 units before a regular meal - 20 units before a larger meal  Continue Metformin 1000 mg 2x a day  Please return in 1.5 months with your sugar log.

## 2016-06-06 ENCOUNTER — Other Ambulatory Visit: Payer: Self-pay | Admitting: Family Medicine

## 2016-06-06 ENCOUNTER — Other Ambulatory Visit: Payer: Self-pay | Admitting: Emergency Medicine

## 2016-06-06 DIAGNOSIS — I10 Essential (primary) hypertension: Secondary | ICD-10-CM

## 2016-06-09 NOTE — Telephone Encounter (Signed)
Last ov 03/2016

## 2016-07-19 ENCOUNTER — Ambulatory Visit (INDEPENDENT_AMBULATORY_CARE_PROVIDER_SITE_OTHER): Payer: Managed Care, Other (non HMO) | Admitting: Internal Medicine

## 2016-07-19 ENCOUNTER — Encounter: Payer: Self-pay | Admitting: Internal Medicine

## 2016-07-19 VITALS — BP 148/94 | HR 89 | Ht 68.0 in | Wt 210.0 lb

## 2016-07-19 DIAGNOSIS — E1165 Type 2 diabetes mellitus with hyperglycemia: Secondary | ICD-10-CM

## 2016-07-19 DIAGNOSIS — E1151 Type 2 diabetes mellitus with diabetic peripheral angiopathy without gangrene: Secondary | ICD-10-CM

## 2016-07-19 DIAGNOSIS — IMO0002 Reserved for concepts with insufficient information to code with codable children: Secondary | ICD-10-CM

## 2016-07-19 DIAGNOSIS — Z794 Long term (current) use of insulin: Secondary | ICD-10-CM | POA: Diagnosis not present

## 2016-07-19 DIAGNOSIS — E119 Type 2 diabetes mellitus without complications: Secondary | ICD-10-CM

## 2016-07-19 MED ORDER — INSULIN GLARGINE 100 UNIT/ML SOLOSTAR PEN: 50.0000 [IU] | PEN_INJECTOR | Freq: Every day | SUBCUTANEOUS | 1 refills | Status: DC

## 2016-07-19 MED ORDER — INSULIN REGULAR HUMAN 100 UNIT/ML IJ SOLN: 20.0000 [IU] | Freq: Three times a day (TID) | INTRAMUSCULAR | 5 refills | Status: DC

## 2016-07-19 NOTE — Progress Notes (Signed)
Subjective:     Patient ID: Raymond Pugh, male   DOB: [birth year], 56 y.o.   MRN: [id number]  HPI Raymond Pugh is a pleasant 56 y.o. man,returning for f/u for management of DM2, dx 2008, uncontrolled, insulin-dependent, with complications (peripheral neuropathy). Last visit 1.5 mo ago.   He is noncompliant with appts and recommended tx's and not checking sugars.   Today he checked his sugars at home before the visit: 477 >> took 20 units of R insulin >> 30 min later, in the office 559 >> 30 min later >> 567.  Last A hemoglobin A1c: Lab Results  Component Value Date   HGBA1C 14.0 04/08/2016   HGBA1C >14.0 11/24/2015   HGBA1C 9.6 08/20/2015   He is now on: - Lantus 42 units at bedtime >> forgets doses - R insulin - inject this insulin 30 min before the meal.  - 16 units before a smaller meal - 18 units before a regular meal - 20 units before a larger meal - Metformin 1000 mg 2x a day Stopped Glipizide. No lows. He has hypoglycemia awareness at <90.  He checks his sugars 2-3x a day - bring meter: - a.m.:  108, 118-120s, 220 (if large meal at night) >> 70, 170-200, 270 (when forgets Lantus) >> 152-274 - 2h after b'fast: n/c >> 230 >> n/c - before lunch: 120 >> 269, 334 >> 145-352, 422 >> 120-220 >> 60 (took Humalog after eating), 170 >> 163-307 - 2h after lunch: 289, 300 >> 177-324 >> n/c >> 132, 301 - before dinner: 217 >> 249, 390 >> 211-398 >> 120s- rarely 200s >> 170-200 >> 477 - after dinner: 233-275 >> 284-405 >> 187-431 >> 200s >> 200s >> 347-380 - bedtime: 303-394 Highest: 431 >> 200s >> 600 x1 >> 567 (in the office), Lowest: 139 >> 108 >> 60 >> 132 He had sugars of 600s in 12/2015 as he felt low and started to drink juice without checking his sugars!   - No kidney dysfunction.  Last BUN/Cr: Lab Results  Component Value Date   BUN 13 04/08/2016   Lab Results  Component Value Date   CREATININE 0.66 (L) 04/08/2016  On Ramipril.  - last lipids: Lab Results   Component Value Date   CHOL 157 11/24/2015   HDL 44 11/24/2015   LDLCALC 83 11/24/2015   TRIG 148 11/24/2015   CHOLHDL 3.6 11/24/2015  On Lovastatin. - Last eye exam: 12/2014, [facility]. He has had retinal detachment surgery.   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  Review of Systems Constitutional: no weight gain, no fatigue, no subjective hyperthermia/hypothermia Eyes: no blurry vision, no xerophthalmia ENT: no sore throat, no nodules palpated in throat, + dysphagia (he has Barrett's esophagus), no hoarseness Cardiovascular: no CP/SOB/palpitations/leg swelling Respiratory: no cough/SOB Gastrointestinal: no N/V/D/C Musculoskeletal: no muscle/joint aches Skin: no rashes Neurological: no tremors/numbness/tingling/dizziness  I reviewed pt's medications, allergies, PMH, social hx, family hx, and changes were documented in the history of present illness. Otherwise, unchanged from my initial visit note.  Objective:   Physical Exam BP (!) 148/94 (BP Location: Left Arm, Patient Position: Sitting)   Pulse 89   Ht 5\' 8"  (1.727 m)   Wt 210 lb (95.3 kg)   SpO2 95%   BMI 31.93 kg/m  Body mass index is 31.93 kg/m. Wt Readings from Last 3 Encounters:  07/19/16 210 lb (95.3 kg)  05/31/16 204 lb (92.5 kg)  04/10/16 197 lb (89.4 kg)  Constitutional: overweight, in NAD Eyes:  PERRLA, , anisocoria - surgical pupil on the left, EOMI, no exophthalmos ENT: moist mucous membranes, no thyromegaly, no cervical lymphadenopathy Cardiovascular: RRR, No MRG Respiratory: CTA B Gastrointestinal: abdomen soft, NT, ND, BS+ Musculoskeletal: no deformities, strength intact in all 4 Skin: moist, warm, no rashes Neurological: no tremor with outstretched hands, DTR normal in all 4   Assessment:     1.  DM2, uncontrolled, insulin-dependent, with complications - peripheral neuropathy  Plan:     1. Pt with long-standing, uncontrolled, diabetes, coming late for his appointment today, with  sugars in the 500s. He ate a piece of cake in the morning which she did not cover with insulin and his sugars at home before the visit were in the 400s. He took 20 units of regular insulin but when he got to the office, his CBG was 559, and 567 less than 30 minutes later. At that point, we gave him a bolus of 15 units of Humalog, I also advised him to go home and drink plenty of water, to check his sugars every 30 minutes, and go to the hospital if they are not coming down.  - At last visit, we changed his basal-bolus insulin regimen, by switching to regular insulin rather than Humalog, since he could not afford the latter. I believe his main problem is forgetting insulin doses (he confirms that he frequently forgets Lantus) but also his poor diet. We discussed about improving both, but I will also increase his insulin doses for now. We'll hold the metformin for now since he is most likely very dehydrated. - I advised him to: Patient Instructions  Please increase: - Lantus 50 units at bedtime - R insulin - inject this insulin 30 min before the meal.  - 20 units before a smaller meal - 24 units before a regular meal - 28 units before a larger meal  Please continue: - Metformin 1000 mg 2x a day (but hold it tonight)  If sugars do not come down tonight >> you need to go to the hospital.  Please return in 1-1.5 months with your sugar log.   - Advised to schedule a new eye exam - RTC in 1-1.5 month with his sugar log  [name], MD PhD [facility] Endocrinology

## 2016-07-19 NOTE — Patient Instructions (Addendum)
Please increase: - Lantus 50 units at bedtime - R insulin - inject this insulin 30 min before the meal.  - 20 units before a smaller meal - 24 units before a regular meal - 28 units before a larger meal  Please continue: - Metformin 1000 mg 2x a day (but hold it tonight)  If sugars do not come down tonight >> you need to go to the hospital.  Please return in 1-1.5 months with your sugar log.

## 2016-07-20 LAB — POCT GLYCOSYLATED HEMOGLOBIN (HGB A1C): Hemoglobin A1C: 10.1

## 2016-07-20 NOTE — Addendum Note (Signed)
Addended by: Ena Dawley on: 07/20/2016 08:58 AM   Modules accepted: Orders

## 2016-08-19 ENCOUNTER — Other Ambulatory Visit: Payer: Self-pay | Admitting: Internal Medicine

## 2016-08-19 NOTE — Telephone Encounter (Signed)
Refill  BD PEN NEEDLE NANO U/F 32G X 4 M MISC 400 each      [facility] [place], [place] - [address] AT [facility] [address] [phone] (Phone) [phone] (Fax)

## 2016-09-09 ENCOUNTER — Ambulatory Visit (INDEPENDENT_AMBULATORY_CARE_PROVIDER_SITE_OTHER): Payer: Managed Care, Other (non HMO) | Admitting: Internal Medicine

## 2016-09-09 ENCOUNTER — Encounter: Payer: Self-pay | Admitting: Internal Medicine

## 2016-09-09 VITALS — BP 122/72 | HR 80 | Temp 97.6°F | Wt 216.0 lb

## 2016-09-09 DIAGNOSIS — E1151 Type 2 diabetes mellitus with diabetic peripheral angiopathy without gangrene: Secondary | ICD-10-CM | POA: Diagnosis not present

## 2016-09-09 DIAGNOSIS — E1165 Type 2 diabetes mellitus with hyperglycemia: Secondary | ICD-10-CM

## 2016-09-09 DIAGNOSIS — E119 Type 2 diabetes mellitus without complications: Secondary | ICD-10-CM | POA: Diagnosis not present

## 2016-09-09 DIAGNOSIS — IMO0002 Reserved for concepts with insufficient information to code with codable children: Secondary | ICD-10-CM

## 2016-09-09 DIAGNOSIS — Z794 Long term (current) use of insulin: Secondary | ICD-10-CM

## 2016-09-09 MED ORDER — INSULIN GLARGINE 100 UNIT/ML SOLOSTAR PEN: 52.0000 [IU] | PEN_INJECTOR | Freq: Every day | SUBCUTANEOUS | 3 refills | Status: DC

## 2016-09-09 MED ORDER — METFORMIN HCL 1000 MG PO TABS: 1000.0000 mg | ORAL_TABLET | Freq: Two times a day (BID) | ORAL | 3 refills | Status: DC

## 2016-09-09 MED ORDER — "INSULIN SYRINGE-NEEDLE U-100 31G X 15/64"" 0.5 ML MISC": 5 refills | Status: DC

## 2016-09-09 NOTE — Patient Instructions (Addendum)
Please continue: - Metformin 1000 mg 2x a day  Increase: - Lantus to 52 units - move it after dinner  Continue: - R insulin - inject this insulin 30 min before the meal.  - 20 units before a smaller meal - 24 units before a regular meal - 28 units before a larger meal  Please return in 3 months with your sugar log.

## 2016-09-09 NOTE — Progress Notes (Signed)
Subjective:     Patient ID: Raymond Pugh, male   DOB: 1960/07/04, 56 y.o.   MRN: [id number]  HPI Raymond Pugh is a pleasant 56 y.o. man,returning for f/u for management of DM2, dx 2008, uncontrolled, insulin-dependent, with complications (peripheral neuropathy). Last visit 1.5 mo ago.   Last A hemoglobin A1c: Lab Results  Component Value Date   HGBA1C 10.1 07/20/2016   HGBA1C 14.0 04/08/2016   HGBA1C >14.0 11/24/2015   He is now on: - Metformin 1000 mg 2x a day - Lantus 46 units at bedtime - (did not increase to 50 units as advised at last visit) - R insulin - inject this insulin 30 min before the meal.  - 20 units before a smaller meal - 24 units before a regular meal - 28 units before a larger meal Stopped Glipizide. + 1 low: 60s (evening after overbolusing for dinner). He has hypoglycemia awareness at <90.  He checks his sugars 2-3x a day - forgot log: - a.m.:  70, 170-200, 270 (when forgets Lantus) >> 152-274 >> 150-170-200, 220  - higher if forgets Lantus at night - 2h after b'fast: n/c >> 230 >> n/c - before lunch: 120-220 >> 60 (took Humalog after eating), 170 >> 163-307 >> 120-150 - 2h after lunch: 289, 300 >> 177-324 >> n/c >> 132, 301 >> n/c - before dinner: 211-398 >> 120s- rarely 200s >> 170-200 >> 477 >> 120-170 - after dinner: 284-405 >> 187-431 >> 200s >> 200s >> 347-380 >> n/c >> 60 x1 - bedtime: 303-394 >> 150-170 Highest: 431 >> 200s >> 600 x1 >> 567 (in the office) >> 500s (after a camping trip) Lowest: 139 >> 108 >> 60 >> 132 >> 60 x1 He had sugars of 600s in 12/2015 as he felt low and started to drink juice without checking his sugars!   - No kidney dysfunction.  Last BUN/Cr: Lab Results  Component Value Date   BUN 13 04/08/2016   Lab Results  Component Value Date   CREATININE 0.66 (L) 04/08/2016  On Ramipril.  - last lipids: Lab Results  Component Value Date   CHOL 157 11/24/2015   HDL 44 11/24/2015   LDLCALC 83 11/24/2015   TRIG 148  11/24/2015   CHOLHDL 3.6 11/24/2015  On Lovastatin. - Last eye exam: 12/2014, [facility]. He has had retinal detachment surgery.   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 He has gait aches almost every weekend.  Review of Systems Constitutional: no weight gain/loss, no fatigue, no subjective hyperthermia/hypothermia Eyes: no blurry vision, no xerophthalmia ENT: no sore throat, no nodules palpated in throat, + dysphagia (he has Barrett's esophagus),+ (allergies) hoarseness Cardiovascular: no CP/SOB/palpitations/leg swelling Respiratory: no cough/SOB Gastrointestinal: no N/V/D/C Musculoskeletal: no muscle/joint aches Skin: no rashes Neurological: no tremors/numbness/tingling/dizziness  I reviewed pt's medications, allergies, PMH, social hx, family hx, and changes were documented in the history of present illness. Otherwise, unchanged from my initial visit note.  Objective:   Physical Exam BP 122/72 (BP Location: Left Arm, Patient Position: Sitting, Cuff Size: Normal)   Pulse 80   Temp 97.6 F (36.4 C) (Oral)   Wt 216 lb (98 kg)   SpO2 97%   BMI 32.84 kg/m  Body mass index is 32.84 kg/m. Wt Readings from Last 3 Encounters:  09/09/16 216 lb (98 kg)  07/19/16 210 lb (95.3 kg)  05/31/16 204 lb (92.5 kg)   Constitutional: overweight, in NAD Eyes: PERRLA, EOMI, no exophthalmos ENT:  moist mucous membranes, no thyromegaly, no cervical lymphadenopathy Cardiovascular: RRR, No MRG Respiratory: CTA B Gastrointestinal: abdomen soft, NT, ND, BS+ Musculoskeletal: no deformities, strength intact in all 4 Skin: moist, warm, no rashes Neurological: no tremor with outstretched hands, DTR normal in all 4 Assessment:     1.  DM2, uncontrolled, insulin-dependent, with complications - peripheral neuropathy  Plan:     1. Pt with long-standing, uncontrolled, diabetes, with improved sugars after increasing his insulin doses at last visit. He still occasionally  forgets Lantus and his sugars are high in the morning whenever he does. Therefore, will move Lantus after dinner and I advised him to take along with the metformin so that we increase the chance of him remembering to take it. He is not taking the Lantus dose at the recommended at last visit, so we will increase it at this visit, since his sugars in the morning are quite high. - He is mostly taking 20 units of R insulin before meals. I advised him that he can definitely go higher if his sugars are higher before the meal or if he has a larger meal. - No other changes needed a his regimen for now. - I advised him to: Patient Instructions  Please continue: - Metformin 1000 mg 2x a day  Increase: - Lantus to 52 units - move it after dinner  Continue: - R insulin - inject this insulin 30 min before the meal.  - 20 units before a smaller meal - 24 units before a regular meal - 28 units before a larger meal  Please return in 3 months with your sugar log.   - Again advised him to schedule a new eye exam - RTC in 3 month with his sugar log. We'll check a hemoglobin A1c at that time.  [name], MD PhD [facility] Endocrinology

## 2016-09-13 ENCOUNTER — Other Ambulatory Visit: Payer: Self-pay | Admitting: Family Medicine

## 2016-09-13 DIAGNOSIS — I10 Essential (primary) hypertension: Secondary | ICD-10-CM

## 2016-09-14 NOTE — Telephone Encounter (Signed)
Please notify the patient that his last labs were 5 months ago and his potassium was low. He needs to come in for an office visit in the next 2 weeks  Please notify pt to come in for non-fasting labs.  Sometimes the potassium runs low when fasting so he should eat a healthy breakfast or lunch before his appointment. He can pick up his refill from the pharmacy.

## 2016-09-21 ENCOUNTER — Other Ambulatory Visit: Payer: Self-pay | Admitting: Internal Medicine

## 2016-10-19 ENCOUNTER — Other Ambulatory Visit: Payer: Self-pay | Admitting: Internal Medicine

## 2016-11-25 ENCOUNTER — Other Ambulatory Visit: Payer: Self-pay | Admitting: Emergency Medicine

## 2016-11-25 DIAGNOSIS — E119 Type 2 diabetes mellitus without complications: Secondary | ICD-10-CM

## 2016-11-25 DIAGNOSIS — Z794 Long term (current) use of insulin: Principal | ICD-10-CM

## 2016-12-02 ENCOUNTER — Other Ambulatory Visit: Payer: Self-pay | Admitting: Emergency Medicine

## 2016-12-06 ENCOUNTER — Other Ambulatory Visit: Payer: Self-pay | Admitting: Internal Medicine

## 2016-12-06 ENCOUNTER — Other Ambulatory Visit: Payer: Self-pay | Admitting: Family Medicine

## 2016-12-06 DIAGNOSIS — I10 Essential (primary) hypertension: Secondary | ICD-10-CM

## 2016-12-07 ENCOUNTER — Other Ambulatory Visit: Payer: Self-pay | Admitting: Emergency Medicine

## 2016-12-07 NOTE — Telephone Encounter (Signed)
BP Readings from Last 3 Encounters:  09/09/16 122/72  07/19/16 (!) 148/94  05/31/16 130/74   Please let the patient know that he needs an office visit every six months for hypertension

## 2016-12-22 ENCOUNTER — Encounter: Payer: Self-pay | Admitting: Internal Medicine

## 2016-12-22 ENCOUNTER — Ambulatory Visit (INDEPENDENT_AMBULATORY_CARE_PROVIDER_SITE_OTHER): Payer: Managed Care, Other (non HMO) | Admitting: Internal Medicine

## 2016-12-22 ENCOUNTER — Other Ambulatory Visit: Payer: Self-pay

## 2016-12-22 VITALS — BP 132/82 | HR 74 | Ht 68.0 in | Wt 233.0 lb

## 2016-12-22 DIAGNOSIS — E1151 Type 2 diabetes mellitus with diabetic peripheral angiopathy without gangrene: Secondary | ICD-10-CM

## 2016-12-22 DIAGNOSIS — Z794 Long term (current) use of insulin: Principal | ICD-10-CM

## 2016-12-22 DIAGNOSIS — E119 Type 2 diabetes mellitus without complications: Secondary | ICD-10-CM

## 2016-12-22 DIAGNOSIS — E1165 Type 2 diabetes mellitus with hyperglycemia: Secondary | ICD-10-CM

## 2016-12-22 DIAGNOSIS — E785 Hyperlipidemia, unspecified: Secondary | ICD-10-CM

## 2016-12-22 DIAGNOSIS — IMO0002 Reserved for concepts with insufficient information to code with codable children: Secondary | ICD-10-CM

## 2016-12-22 LAB — POCT GLYCOSYLATED HEMOGLOBIN (HGB A1C): Hemoglobin A1C: 9.2

## 2016-12-22 MED ORDER — INSULIN REGULAR HUMAN 100 UNIT/ML IJ SOLN: 20.0000 [IU] | Freq: Three times a day (TID) | INTRAMUSCULAR | 5 refills | Status: DC

## 2016-12-22 MED ORDER — INSULIN GLARGINE 100 UNIT/ML SOLOSTAR PEN: 52.0000 [IU] | PEN_INJECTOR | Freq: Every day | SUBCUTANEOUS | 3 refills | Status: DC

## 2016-12-22 MED ORDER — METFORMIN HCL 1000 MG PO TABS: 1000.0000 mg | ORAL_TABLET | Freq: Two times a day (BID) | ORAL | 3 refills | Status: DC

## 2016-12-22 MED ORDER — LOVASTATIN 20 MG PO TABS: 20.0000 mg | ORAL_TABLET | Freq: Every day | ORAL | 2 refills | Status: DC

## 2016-12-22 NOTE — Patient Instructions (Addendum)
Please restart: - Metformin 1000 mg 2x a day  Please continue: - Lantus 52 units after dinner (take this dose only) - R insulin - inject this insulin 30 min before the meal.  - 20 units before a smaller meal - 24-26 units before a regular meal - 28 units before a larger meal  Please return in 3 months with your sugar log.

## 2016-12-22 NOTE — Progress Notes (Signed)
Subjective:     Patient ID: KENTARO ALEWINE, male   DOB: Aug 20, 1960, 56 y.o.   MRN: [id number]  HPI Mr. Osborn is a pleasant 56 y.o. man,returning for f/u for management of DM2, dx 2008, uncontrolled, insulin-dependent, with complications (peripheral neuropathy). Last visit 3.5 mo ago.  He gained 17 lbs since last visit. He tells me he was eating more.  Last A hemoglobin A1c: Lab Results  Component Value Date   HGBA1C 10.1 07/20/2016   HGBA1C 14.0 04/08/2016   HGBA1C >14.0 11/24/2015   He is now on: - Metformin 1000 mg 2x a day >> reduced the dose to 1000 mg a day 1.5 mo ago, but some days skipping it completely - Lantus 46-52 units after dinner (She is not taking the recommended 52 units, but decreasing the dose to 46 frequently). - R insulin - inject this insulin 30 min before the meal.  - 20 units before a smaller meal - 24-26 units before a regular meal - 28 units before a larger meal Previously on Glipizide. He has hypoglycemia awareness at <90.  He checks his sugars 2-3 times a day: - a.m.: 150-170-200, 220  >> 119-330 - 2h after b'fast: n/c >> 230 >> n/c >> 181-273 - before lunch:163-307 >> 120-150 >> 109, 173-374, 481 - 2h after lunch:  132, 301 >> n/c >> 157-321 - before dinner: 170-200 >> 477 >> 120-170 >> 106, 234-311 - after dinner:347-380 >> n/c >> 60 x1 >> 84, 117, 183-327, 427 - bedtime: 303-394 >> 150-170 >> 279, 350 Highest: 600 x1 >> 567 (in the office) >> 500s (after a camping trip) >> 481 Lowest: 60 x1 >> 84 He had sugars of 600s in 12/2015 as he felt low and started to drink juice without checking his sugars!   - No history of CKD. Last BUN/Cr: Lab Results  Component Value Date   BUN 13 04/08/2016   Lab Results  Component Value Date   CREATININE 0.66 (L) 04/08/2016  On the ramipril.  - last lipids: Lab Results  Component Value Date   CHOL 157 11/24/2015   HDL 44 11/24/2015   LDLCALC 83 11/24/2015   TRIG 148 11/24/2015   CHOLHDL 3.6 11/24/2015   On lovastatin - Last eye exam: 12/2014, [facility]. He has had retinal detachment surgery.   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 He has gait aches almost every weekend.  Review of Systems Constitutional: + weight gain no fatigue, no subjective hyperthermia, no subjective hypothermia Eyes: no blurry vision, no xerophthalmia ENT: no sore throat, no nodules palpated in throat, no dysphagia, no odynophagia, no hoarseness Cardiovascular: no CP/no SOB/no palpitations/no leg swelling Respiratory: no cough/no SOB/no wheezing Gastrointestinal: no N/no V/no D/no C/no acid reflux Musculoskeletal: no muscle aches/no joint aches Skin: no rashes, no hair loss Neurological: no tremors/no numbness/no tingling/no dizziness  I reviewed pt's medications, allergies, PMH, social hx, family hx, and changes were documented in the history of present illness. Otherwise, unchanged from my initial visit note.  Objective:   Physical Exam BP 132/82 (BP Location: Left Arm, Patient Position: Sitting)   Pulse 74   Ht 5\' 8"  (1.727 m)   Wt 233 lb (105.7 kg)   SpO2 96%   BMI 35.43 kg/m  Body mass index is 35.43 kg/m. Wt Readings from Last 3 Encounters:  12/22/16 233 lb (105.7 kg)  09/09/16 216 lb (98 kg)  07/19/16 210 lb (95.3 kg)   Constitutional: overweight, in NAD  Eyes: PERRLA, EOMI, no exophthalmos ENT: moist mucous membranes, no thyromegaly, no cervical lymphadenopathy Cardiovascular: RRR, No MRG Respiratory: CTA B Gastrointestinal: abdomen soft, NT, ND, BS+ Musculoskeletal: no deformities, strength intact in all 4 Skin: moist, warm, no rashes Neurological: no tremor with outstretched hands, DTR normal in all 4  Assessment:     1.  DM2, uncontrolled, insulin-dependent, with complications - peripheral neuropathy - stable  Plan:     1. Pt with long-standing, Uncontrolled diabetes, with slowly improving sugars. They are still very fluctuating and he still has  some very high CBGs, but not in the 500s anymore. At last visit, we moved to Lantus after dinner as he was forgetting to take it at night. Since then, he reduced the dose to 46 and only occasionally taking 50 or 52 units. We discussed that this is not enough, and advised him to increase it back to 52. Since last visit, he also stop metformin due to negative TV publicity. I strongly advised him to restart it and we discussed about risks and benefits. - We can continue with the current doses of R insulin for now - I advised him to: Patient Instructions  Please restart: - Metformin 1000 mg 2x a day  Please continue: - Lantus 52 units after dinner (take this dose only) - R insulin - inject this insulin 30 min before the meal.  - 20 units before a smaller meal - 24-26 units before a regular meal - 28 units before a larger meal  Please return in 3 months with your sugar log.   - today, HbA1c is 9.2% (better) - continue checking sugars at different times of the day - check 3x a day, rotating checks >>  he brings his meter which we downloaded - advised for yearly eye exams >> he  needs one  - Return to clinic in 3 mo with sugar log   2. HL - Reviewed latest lipid panel from a year ago - He will have another appointment with PCP soon - Continue Lovastatin >> no SEs  [name], MD PhD [facility] Endocrinology

## 2016-12-22 NOTE — Addendum Note (Signed)
Addended by: Caprice Beaver T on: 12/22/2016 11:25 AM   Modules accepted: Orders

## 2017-01-30 ENCOUNTER — Other Ambulatory Visit: Payer: Self-pay | Admitting: Internal Medicine

## 2017-02-13 ENCOUNTER — Other Ambulatory Visit: Payer: Self-pay | Admitting: Family Medicine

## 2017-02-19 ENCOUNTER — Other Ambulatory Visit: Payer: Self-pay | Admitting: Family Medicine

## 2017-03-07 ENCOUNTER — Other Ambulatory Visit: Payer: Self-pay | Admitting: Family Medicine

## 2017-03-07 DIAGNOSIS — I10 Essential (primary) hypertension: Secondary | ICD-10-CM

## 2017-03-15 ENCOUNTER — Other Ambulatory Visit: Payer: Self-pay | Admitting: Family Medicine

## 2017-03-24 ENCOUNTER — Ambulatory Visit: Payer: Managed Care, Other (non HMO) | Admitting: Internal Medicine

## 2017-03-25 ENCOUNTER — Ambulatory Visit (INDEPENDENT_AMBULATORY_CARE_PROVIDER_SITE_OTHER): Payer: Managed Care, Other (non HMO) | Admitting: Internal Medicine

## 2017-03-25 ENCOUNTER — Encounter: Payer: Self-pay | Admitting: Internal Medicine

## 2017-03-25 VITALS — BP 140/82 | HR 82 | Temp 97.7°F | Ht 68.0 in | Wt 229.0 lb

## 2017-03-25 DIAGNOSIS — E1151 Type 2 diabetes mellitus with diabetic peripheral angiopathy without gangrene: Secondary | ICD-10-CM | POA: Diagnosis not present

## 2017-03-25 DIAGNOSIS — IMO0002 Reserved for concepts with insufficient information to code with codable children: Secondary | ICD-10-CM

## 2017-03-25 DIAGNOSIS — E1165 Type 2 diabetes mellitus with hyperglycemia: Secondary | ICD-10-CM | POA: Diagnosis not present

## 2017-03-25 LAB — POCT GLYCOSYLATED HEMOGLOBIN (HGB A1C): HEMOGLOBIN A1C: 10.1

## 2017-03-25 MED ORDER — GLUCOSE BLOOD VI STRP: ORAL_STRIP | 11 refills | Status: DC

## 2017-03-25 NOTE — Patient Instructions (Addendum)
Please continue: - Metformin 1000 mg 2x a day - Lantus 56 units - move this with dinner - R insulin - inject this insulin 30 min before the meal.  - 20 units before a smaller meal - 24-26 units before a regular meal - 28 units before a larger meal  DO NOT MISS ANY DOSES!  Do not take more than 5 units of R insulin for correction.  Please return in 3 months with your sugar log.

## 2017-03-25 NOTE — Progress Notes (Addendum)
Subjective:     Patient ID: Raymond Pugh, male   DOB: 08/09/1960, 56 y.o.   MRN: [id number]  HPI Raymond Pugh is a 56 y.o. man,returning for f/u for management of DM2, dx 2008, uncontrolled, insulin-dependent, with complications (peripheral neuropathy). Last visit 3 mo ago.  Since last visit, he broke his meter >> got a new one, but did not check sugars for few weeks. Also, missed many insulin doses. He also had 2 lows at night after taking R insulin at bedtime (mealtime dose!).   Last hemoglobin A1c: Lab Results  Component Value Date   HGBA1C 9.2 12/22/2016   HGBA1C 10.1 07/20/2016   HGBA1C 14.0 04/08/2016   He is now on: - Metformin 1000 mg 2x a day  - Lantus 56 units after dinner - misses 2/7 doses - R insulin - inject this insulin 30 min before the meal - misses some  doses - 20 units before a smaller meal - 24-26 units before a regular meal - 28 units before a larger meal Previously on Glipizide. He has hypoglycemia awareness at <90.  He checks his sugars 2.4x a day: - a.m.: 150-170-200, 220  >> 119-330 >> 116-174 (if takes Lantus), 200-378 (if misses Lantus) - 2h after b'fast: n/c >> 230 >> n/c >> 181-273 >> 170-272 - before lunch:1  120-150 >> 109, 173-374, 481 >> see above - 2h after lunch:  132, 301 >> n/c >> 157-321 >> 71-254, 368 - before dinner: 1120-170 >> 106, 234-311 >> 180-297 - after dinner:60 x1 >> 84, 117, 183-327, 427 >> 192-441, 598 - bedtime: 303-394 >> 150-170 >> 279, 350 >> see above Highest: 600 x1 >> 567 (in the office) >> 500s (after a camping trip) >> 481 >> 598 Lowest: 60 x1 >> 84 >> 71 (mid-day) He had sugars of 600s in 12/2015 as he felt low and started to drink juice without checking his sugars!   - No CKD. Last BUN/Cr: Lab Results  Component Value Date   BUN 13 04/08/2016   Lab Results  Component Value Date   CREATININE 0.66 (L) 04/08/2016  On Ramipril. - last lipids: Lab Results  Component Value Date   CHOL 157 11/24/2015   HDL 44  11/24/2015   LDLCALC 83 11/24/2015   TRIG 148 11/24/2015   CHOLHDL 3.6 11/24/2015  On Lovastatin - Last eye exam: 01/2017: [facility]. He has had retinal detachment surgery.   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 He has gait aches almost every weekend.  Review of Systems Constitutional: no weight gain/no weight loss, no fatigue, no subjective hyperthermia, no subjective hypothermia Eyes: no blurry vision, no xerophthalmia ENT: no sore throat, no nodules palpated in throat, no dysphagia, no odynophagia, no hoarseness Cardiovascular: no CP/no SOB/no palpitations/no leg swelling Respiratory: no cough/no SOB/no wheezing Gastrointestinal: no N/no V/no D/no C/no acid reflux Musculoskeletal: no muscle aches/no joint aches Skin: no rashes, no hair loss Neurological: no tremors/no numbness/no tingling/no dizziness  I reviewed pt's medications, allergies, PMH, social hx, family hx, and changes were documented in the history of present illness. Otherwise, unchanged from my initial visit note.   Objective:   Physical Exam BP 140/82 (BP Location: Right Arm, Patient Position: Sitting, Cuff Size: Normal)   Pulse 82   Temp 97.7 F (36.5 C) (Oral)   Ht 5\' 8"  (1.727 m)   Wt 229 lb (103.9 kg)   SpO2 97%   BMI 34.82 kg/m  Body mass  index is 34.82 kg/m. Wt Readings from Last 3 Encounters:  03/25/17 229 lb (103.9 kg)  12/22/16 233 lb (105.7 kg)  09/09/16 216 lb (98 kg)   Constitutional: overweight, in NAD Eyes: PERRLA, EOMI, no exophthalmos ENT: moist mucous membranes, no thyromegaly, no cervical lymphadenopathy Cardiovascular: RRR, No MRG Respiratory: CTA B Gastrointestinal: abdomen soft, NT, ND, BS+ Musculoskeletal: no deformities, strength intact in all 4 Skin: moist, warm, no rashes Neurological: no tremor with outstretched hands, DTR normal in all 4  Assessment:     1.  DM2, uncontrolled, insulin-dependent, with complications - peripheral  neuropathy - stable  Plan:     1. Pt with long-standing, uncontrolled DM, with medication noncompliance.  - He is missing many doses on insulin,  especially Lantus, as he falls asleep at night before taking it. He did not move Lantus at dinnertime as suggested at last visit >> again advised to do so  - He also takes a full Regular insulin dose at bedtime (20 units) if sugars are higher >> discussed to not take this much as he may have hypogly overnight - he actually did have some mild lows at night when doing so - the main problem for him is compliance >> I cannot adjust his regimen before he actually takes the doses, but we discussed to increase the insulin doses if his sugars are still high after meals when he starts to take all doses as Rx'ed. - I advised him to: Patient Instructions  Please continue: - Metformin 1000 mg 2x a day - Lantus 56 units - move this with dinner - R insulin - inject this insulin 30 min before the meal.  - 20 units before a smaller meal - 24-26 units before a regular meal - 28 units before a larger meal  DO NOT MISS ANY DOSES!  Do not take more than 5 units of R insulin for correction.  Please return in 3 months with your sugar log.   - today, HbA1c is 10.1% (higher) - continue checking sugars at different times of the day - check 3x a day, rotating checks - advised for yearly eye exams >> he is UTD - Return to clinic in 3 mo with sugar log   2. HL - Reviewed latest lipid panel from a year ago - needs a new lipid panel  - will have appt with PCP next mo - Continue Lovastatin >> no SE  [name], MD PhD [facility] Endocrinology

## 2017-05-13 ENCOUNTER — Other Ambulatory Visit: Payer: Self-pay | Admitting: Internal Medicine

## 2017-05-22 ENCOUNTER — Telehealth: Payer: Self-pay | Admitting: Family Medicine

## 2017-05-25 ENCOUNTER — Telehealth: Payer: Self-pay | Admitting: Internal Medicine

## 2017-05-25 ENCOUNTER — Telehealth: Payer: Self-pay

## 2017-05-25 MED ORDER — ACCU-CHEK GUIDE W/DEVICE KIT: 1.0000 | PACK | Freq: Once | 0 refills | Status: AC

## 2017-05-25 MED ORDER — GLUCOSE BLOOD VI STRP: ORAL_STRIP | 12 refills | Status: DC

## 2017-05-25 MED ORDER — ACCU-CHEK SOFTCLIX LANCET DEV MISC: 0 refills | Status: DC
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
## 2017-05-25 NOTE — Telephone Encounter (Signed)
This was sent this morning, called and informed pt to check with pharmacy

## 2017-05-25 NOTE — Telephone Encounter (Signed)
Patient stated he lost his test kit and needing to get another one. Please advise

## 2017-05-25 NOTE — Telephone Encounter (Signed)
Sent new meter, strips, and lancets

## 2017-05-26 NOTE — Telephone Encounter (Signed)
Error

## 2017-06-01 ENCOUNTER — Telehealth: Payer: Self-pay | Admitting: Family Medicine

## 2017-06-01 DIAGNOSIS — K219 Gastro-esophageal reflux disease without esophagitis: Secondary | ICD-10-CM

## 2017-06-01 DIAGNOSIS — I10 Essential (primary) hypertension: Secondary | ICD-10-CM

## 2017-06-01 MED ORDER — HYDROCHLOROTHIAZIDE 25 MG PO TABS: 25.0000 mg | ORAL_TABLET | Freq: Every day | ORAL | 0 refills | Status: DC

## 2017-06-01 MED ORDER — PANTOPRAZOLE SODIUM 40 MG PO TBEC: DELAYED_RELEASE_TABLET | ORAL | 0 refills | Status: DC

## 2017-06-01 NOTE — Telephone Encounter (Signed)
Pt says he is going out of town and needs med refilled asap

## 2017-06-01 NOTE — Telephone Encounter (Signed)
Copied from [place] [id number]. Topic: Quick Communication - Rx Refill/Question >> Jun 01, 2017 11:56 AM Aurelio Brash B wrote: Medication: hydrochlorothiazide (HYDRODIURIL) 25 MG tablet and pantoprazole (PROTONIX) 40 MG tablet   Has the patient contacted their pharmacy? yes  (Agent: If no, request that the patient contact the pharmacy for the refill.)   Preferred Pharmacy (with phone number or street name): [facility] - [place], [address] AT [facility] [address] [phone] (Phone) [phone] (Fax)     Agent: Please be advised that RX refills may take up to 3 business days. We ask that you follow-up with your pharmacy.

## 2017-06-01 NOTE — Telephone Encounter (Signed)
HCTZ LR: 03/07/17 #30 tab 0 refill  Pantoprazole LR: 04/08/17 #30 tabs ) refills   Pt has upcoming appt 06/07/17 refilled both for 15 days

## 2017-06-07 ENCOUNTER — Encounter: Payer: Self-pay | Admitting: Family Medicine

## 2017-06-07 ENCOUNTER — Ambulatory Visit (INDEPENDENT_AMBULATORY_CARE_PROVIDER_SITE_OTHER): Payer: Managed Care, Other (non HMO) | Admitting: Family Medicine

## 2017-06-07 ENCOUNTER — Other Ambulatory Visit: Payer: Self-pay

## 2017-06-07 VITALS — BP 136/78 | HR 92 | Temp 98.5°F | Resp 18 | Ht 68.0 in | Wt 226.0 lb

## 2017-06-07 DIAGNOSIS — K219 Gastro-esophageal reflux disease without esophagitis: Secondary | ICD-10-CM

## 2017-06-07 DIAGNOSIS — Z23 Encounter for immunization: Secondary | ICD-10-CM

## 2017-06-07 DIAGNOSIS — I10 Essential (primary) hypertension: Secondary | ICD-10-CM | POA: Diagnosis not present

## 2017-06-07 DIAGNOSIS — F418 Other specified anxiety disorders: Secondary | ICD-10-CM

## 2017-06-07 DIAGNOSIS — Z79899 Other long term (current) drug therapy: Secondary | ICD-10-CM

## 2017-06-07 DIAGNOSIS — E785 Hyperlipidemia, unspecified: Secondary | ICD-10-CM

## 2017-06-07 MED ORDER — METOPROLOL TARTRATE 100 MG PO TABS: 100.0000 mg | ORAL_TABLET | Freq: Two times a day (BID) | ORAL | 1 refills | Status: DC

## 2017-06-07 MED ORDER — HYDROCHLOROTHIAZIDE 25 MG PO TABS: 25.0000 mg | ORAL_TABLET | Freq: Every day | ORAL | 1 refills | Status: DC

## 2017-06-07 MED ORDER — ALPRAZOLAM 0.5 MG PO TABS: 0.5000 mg | ORAL_TABLET | Freq: Two times a day (BID) | ORAL | 0 refills | Status: DC | PRN

## 2017-06-07 MED ORDER — LOVASTATIN 20 MG PO TABS: 20.0000 mg | ORAL_TABLET | Freq: Every day | ORAL | 1 refills | Status: DC

## 2017-06-07 MED ORDER — PANTOPRAZOLE SODIUM 40 MG PO TBEC: DELAYED_RELEASE_TABLET | ORAL | 1 refills | Status: DC

## 2017-06-07 MED ORDER — RAMIPRIL 10 MG PO CAPS: 10.0000 mg | ORAL_CAPSULE | Freq: Two times a day (BID) | ORAL | 1 refills | Status: DC

## 2017-06-07 NOTE — Progress Notes (Signed)
Subjective:  This chart was scribed for Wendie Agreste, MD by Tamsen Roers, at [place] at [facility].  This patient was seen in room 1 and the patient's care was started at 9:36 AM.   Chief Complaint  Patient presents with  . Medication Refill    patient presents for refills of Protonix, Lovastatin, Altace and HCTZ, possibly discuss re-order of Xanax. Patient followed by Endo for DM     Patient ID: Raymond Pugh, male    DOB: 09-03-1960, 57 y.o.   MRN: [id number]  HPI HPI Comments: Raymond Pugh is a 57 y.o. male who presents to Primary Care at [facility] for medication refill.  History of diabetes, hypertension, GERD and  hyperlipidemia. He is followed by Cruzita Lederer for his diabetes.  I have not seen him since November of 2017 at that time for a peri rectal abscess.   ADD with anxiety disorder: He has a history of ADD with anxiety disorder last dicussed November 2017. Deferred Adderall at that time due to uncontrolled diabetes and concerned with use of stimulant.  He had Xanax intermittently for break thorough anxiety symptoms. I had asked him to follow up in one month at November 2017 visit.---- Patient does not have anxiety spells as often as he used to and this is because his kids are no longer at home.  Currently, he is having to take it about once a month and feels that it "works well" for him.   Hypertension: He is currently taking  HCTZ 25 mg QD, Metoprolol 100 mg BID and Ramipril 10 mg BID. His blood pressure at most recent endocrinology visit in November: 140/82. -- These [facility]. Patient has been compliant with his medications and denies any side effects. Patient lost his blood pressure cuff so he checks it from time to time when he goes to pick up his medications. He has started going back to the [facility] and has now been exercising for the last couple of weeks.  Patient swims, exercises on the track and uses some of the machines.  He denies any  shortness of breath with exercises and "feels good" after his workouts.  Lab Results  Component Value Date   CREATININE 0.66 (L) 04/08/2016   Hyperlipidemia: He is taking Lovastatin 20 mg QD--- Patient is compliant with this and denies any side effects including myalgias.  Lab Results  Component Value Date   CHOL 157 11/24/2015   HDL 44 11/24/2015   LDLCALC 83 11/24/2015   TRIG 148 11/24/2015   CHOLHDL 3.6 11/24/2015   GERD: Patient takes Protonix 40 mg QD. ---- Patient is compliant with this medication and denies any side effects.  He has been out of it for a while so he purchased over the counter Omeprazole (which also worked well for him).  He does have heart burn when he misses a dose (especially if he eats spicy foods).    Patient would like a tetanus shot today.   Patient Active Problem List   Diagnosis Date Noted  . Liver abscess 06/24/2014  . Eyelid lesion 05/29/2012  . Obesity 08/25/2010  . BARRETTS ESOPHAGUS 07/03/2010  . Iron deficiency anemia, unspecified 11/16/2009  . Hyperlipidemia 11/04/2008  . ADHD 08/14/2007  . MOLE 03/13/2007  . Uncontrolled type 2 diabetes mellitus with peripheral angiopathy ([place]) 03/13/2007  . Essential hypertension 11/03/2006   Past Medical History:  Diagnosis Date  . ADHD (attention deficit hyperactivity disorder)   . Anemia   .  Barrett's esophagus   . Diabetes mellitus   . Hiatal hernia   . Hyperlipemia   . Hypertension   . Myocarditis ([place])    h/o mypocarditis, caused cardiomyopathy-- resolved    Past Surgical History:  Procedure Laterality Date  . ABSCESS DRAIN LIVER PERC ([place] [facility])     klebsiella on liver pa states   . EYE SURGERY    . HYDROCELE EXCISION / REPAIR    . radiokeratotomy    . RETINAL DETACHMENT SURGERY     No Known Allergies Prior to Admission medications   Medication Sig Start Date End Date Taking? Authorizing Provider  ALPRAZolam Duanne Moron) 1 MG tablet Take 0.5-1 tablets (0.5-1 mg total) by mouth daily as  needed (for anxiety). 11/24/15   Darlyne Russian, MD  amphetamine-dextroamphetamine (ADDERALL XR) 20 MG 24 hr capsule Take 1 Capsules by mouth every day prn Patient taking differently: Take 20 mg by mouth daily as needed (ahdh). Take 1 Capsules by mouth every day prn 11/24/15   Darlyne Russian, MD  BD PEN NEEDLE NANO U/F 32G X 4 MM MISC Use 4 times daily 11/24/15   Darlyne Russian, MD  BD PEN NEEDLE NANO U/F 32G X 4 MM MISC AS DIRECTED FOUR TIMES DAILY 05/13/17   [name], MD  docusate sodium (COLACE) 100 MG capsule Take 1 capsule (100 mg total) by mouth every 12 (twelve) hours. 01/21/16   Leo Grosser, MD  glucose blood (ACCU-CHEK GUIDE) test strip Use as instructed to test 4 times daily: E11.65 05/25/17   [name], MD  hydrochlorothiazide (HYDRODIURIL) 25 MG tablet Take 1 tablet (25 mg total) by mouth daily. 06/01/17   Wendie Agreste, MD  Insulin Glargine (LANTUS SOLOSTAR) 100 UNIT/ML Solostar Pen Inject 52 Units into the skin daily at 10 pm. Patient taking differently: Inject 56 Units into the skin daily at 10 pm.  12/22/16   [name], MD  insulin regular (NOVOLIN R,HUMULIN R) 100 units/mL injection Inject 0.2-0.28 mLs (20-28 Units total) into the skin 3 (three) times daily before meals. 12/22/16   [name], MD  Insulin Syringe-Needle U-100 (RELION INSULIN SYRINGE) 31G X 15/64" 0.5 ML MISC Inject 3x a day 09/09/16   [name], MD  Lancet Devices [facility]) lancets Use as instructed 05/25/17   [name], MD  lovastatin (MEVACOR) 20 MG tablet Take 1 tablet (20 mg total) by mouth at bedtime. 12/22/16   [name], MD  metFORMIN (GLUCOPHAGE) 1000 MG tablet Take 1 tablet (1,000 mg total) by mouth 2 (two) times daily. 12/22/16   [name], MD  metoprolol tartrate (LOPRESSOR) 100 MG tablet TAKE 1 TABLET TWICE A DAY 03/07/17   Stallings, Zoe A, MD  NOVOLIN R RELION 100 UNIT/ML injection INJECT 16 TO 20 UNITS SUBCUTANEOUSLY THREE TIMES DAILY BEFORE  MEAL(S) 01/31/17   [name], MD  [facility] DELICA LANCETS FINE MISC Use to test blood sugar 4-6 times daily as instructed. Dx: E11.51 11/24/15   Darlyne Russian, MD  pantoprazole (PROTONIX) 40 MG tablet TAKE 1 TABLET BY MOUTH EVERY MORNING BEFORE BREAKFAST 06/01/17   Wendie Agreste, MD  ramipril (ALTACE) 10 MG capsule Take 1 capsule (10 mg total) by mouth 2 (two) times daily. 11/24/15   Darlyne Russian, MD   Social History   Socioeconomic History  . Marital status: Married    Spouse name: Not on file  . Number of children: 2  . Years of education: Not on file  . Highest education  level: Not on file  Social Needs  . Financial resource strain: Not on file  . Food insecurity - worry: Not on file  . Food insecurity - inability: Not on file  . Transportation needs - medical: Not on file  . Transportation needs - non-medical: Not on file  Occupational History  . Occupation: Ship broker, part time job    Comment: Market researcher, Architect  Tobacco Use  . Smoking status: Never Smoker  . Smokeless tobacco: Never Used  Substance and Sexual Activity  . Alcohol use: Yes    Alcohol/week: 0.6 oz    Types: 1 Cans of beer per week    Comment: socially   . Drug use: No  . Sexual activity: Not on file    Comment: not asked  Other Topics Concern  . Not on file  Social History Narrative   2 children + 2 step children ---    Has a part time job, student    Exercise swimming      Review of Systems  Constitutional: Negative for fatigue and unexpected weight change.  Eyes: Negative for visual disturbance.  Respiratory: Negative for cough, chest tightness and shortness of breath.   Cardiovascular: Negative for chest pain, palpitations and leg swelling.  Gastrointestinal: Negative for abdominal pain and blood in stool.  Neurological: Negative for dizziness, light-headedness and headaches.       Objective:   Physical Exam  Constitutional: He is oriented to person, place, and time. He appears  well-developed and well-nourished.  HENT:  Head: Normocephalic and atraumatic.  Eyes: EOM are normal. Pupils are equal, round, and reactive to light.  Neck: No JVD present. Carotid bruit is not present.  Cardiovascular: Normal rate, regular rhythm and normal heart sounds.  No murmur heard. Pulmonary/Chest: Effort normal and breath sounds normal. He has no rales.  Musculoskeletal: He exhibits no edema.  Neurological: He is alert and oriented to person, place, and time.  Skin: Skin is warm and dry.  Psychiatric: He has a normal mood and affect.  Vitals reviewed.  Vitals:   06/07/17 0903 06/07/17 0950  BP: (!) 146/88 136/78  Pulse: 92   Resp: 18   Temp: 98.5 F (36.9 C)   TempSrc: Oral   SpO2: 96%   Weight: 226 lb (102.5 kg)   Height: 5\' 8"  (1.727 m)        Assessment & Plan:  Raymond Pugh is a 57 y.o. male Hyperlipidemia, unspecified hyperlipidemia type - Plan: lovastatin (MEVACOR) 20 MG tablet, Comprehensive metabolic panel, Lipid panel  - Tolerating lovastatin, check lipids. Consider atorvastatin or rosuvastatin depending on levels with history of diabetes. Refilled same dose lovastatin for now  Essential hypertension - Plan: ramipril (ALTACE) 10 MG capsule, metoprolol tartrate (LOPRESSOR) 100 MG tablet, hydrochlorothiazide (HYDRODIURIL) 25 MG tablet  - Borderline but improved on recheck. Discussed goal of 130/80 or below with diabetes.  Situational anxiety - Plan: ALPRAZolam (XANAX) 0.5 MG tablet  - Rare/intermittent symptoms. Refilled Xanax low-dose for intermittent/rare use. If more persistent need, return to discuss other treatments.  Need for prophylactic vaccination with combined diphtheria-tetanus-pertussis (DTP) vaccine - Plan: Tdap vaccine greater than or equal to 7yo IM given  Gastroesophageal reflux disease, esophagitis presence not specified - Plan: pantoprazole (PROTONIX) 40 MG tablet Current use of proton pump inhibitor - Plan: Vitamin B12, Magnesium  -  Trigger avoidance discussed, continue Protonix daily when necessary, option of spacing out to when necessary use if tolerated. Due to persistent use will check B12/magnesium levels to  screen for deficiencies.   Recheck 6 months, continue 3 month follow-up with endocrinologist  Meds ordered this encounter  Medications  . ramipril (ALTACE) 10 MG capsule    Sig: Take 1 capsule (10 mg total) by mouth 2 (two) times daily.    Dispense:  180 capsule    Refill:  1  . metoprolol tartrate (LOPRESSOR) 100 MG tablet    Sig: Take 1 tablet (100 mg total) by mouth 2 (two) times daily.    Dispense:  180 tablet    Refill:  1  . hydrochlorothiazide (HYDRODIURIL) 25 MG tablet    Sig: Take 1 tablet (25 mg total) by mouth daily.    Dispense:  90 tablet    Refill:  1  . lovastatin (MEVACOR) 20 MG tablet    Sig: Take 1 tablet (20 mg total) by mouth at bedtime.    Dispense:  90 tablet    Refill:  1  . pantoprazole (PROTONIX) 40 MG tablet    Sig: TAKE 1 TABLET BY MOUTH EVERY MORNING BEFORE BREAKFAST    Dispense:  90 tablet    Refill:  1  . ALPRAZolam (XANAX) 0.5 MG tablet    Sig: Take 1 tablet (0.5 mg total) by mouth 2 (two) times daily as needed for anxiety.    Dispense:  20 tablet    Refill:  0   Patient Instructions    See foods to avoid for heartburn below. If tolerated, you can try spacing that medication out to every other day (but can be taken daily if needed).    Food Choices for Gastroesophageal Reflux Disease, Adult When you have gastroesophageal reflux disease (GERD), the foods you eat and your eating habits are very important. Choosing the right foods can help ease your discomfort. What guidelines do I need to follow?  Choose fruits, vegetables, whole grains, and low-fat dairy products.  Choose low-fat meat, fish, and poultry.  Limit fats such as oils, salad dressings, butter, nuts, and avocado.  Keep a food diary. This helps you identify foods that cause symptoms.  Avoid foods  that cause symptoms. These may be different for everyone.  Eat small meals often instead of 3 large meals a day.  Eat your meals slowly, in a place where you are relaxed.  Limit fried foods.  Cook foods using methods other than frying.  Avoid drinking alcohol.  Avoid drinking large amounts of liquids with your meals.  Avoid bending over or lying down until 2-3 hours after eating. What foods are not recommended? These are some foods and drinks that may make your symptoms worse: Vegetables Tomatoes. Tomato juice. Tomato and spaghetti sauce. Chili peppers. Onion and garlic. Horseradish. Fruits Oranges, grapefruit, and lemon (fruit and juice). Meats High-fat meats, fish, and poultry. This includes hot dogs, ribs, ham, sausage, salami, and bacon. Dairy Whole milk and chocolate milk. Sour cream. Cream. Butter. Ice cream. Cream cheese. Drinks Coffee and tea. Bubbly (carbonated) drinks or energy drinks. Condiments Hot sauce. Barbecue sauce. Sweets/Desserts Chocolate and cocoa. Donuts. Peppermint and spearmint. Fats and Oils High-fat foods. This includes [place] fries and potato chips. Other Vinegar. Strong spices. This includes black pepper, white pepper, red pepper, cayenne, curry powder, cloves, ginger, and chili powder. The items listed above may not be a complete list of foods and drinks to avoid. Contact your dietitian for more information. This information is not intended to replace advice given to you by your health care provider. Make sure you discuss any questions you have with  your health care provider. Document Released: 11/09/2011 Document Revised: 10/16/2015 Document Reviewed: 03/14/2013 Elsevier Interactive Patient Education  2017 [employer].    IF you received an x-ray today, you will receive an invoice from [facility]. Please contact [facility] at [phone] with questions or concerns regarding your invoice.   IF you received labwork today,  you will receive an invoice from [place]. Please contact [facility] at [phone] with questions or concerns regarding your invoice.   Our billing staff will not be able to assist you with questions regarding bills from these companies.  You will be contacted with the lab results as soon as they are available. The fastest way to get your results is to activate your My Chart account. Instructions are located on the last page of this paperwork. If you have not heard from [place] regarding the results in 2 weeks, please contact this office.      I personally performed the services described in this documentation, which was scribed in my presence. The recorded information has been reviewed and considered for accuracy and completeness, addended by me as needed, and agree with information above.  Signed,   [name], MD Primary Care at [place].  06/07/17 10:42 AM

## 2017-06-07 NOTE — Patient Instructions (Addendum)
  See foods to avoid for heartburn below. If tolerated, you can try spacing that medication out to every other day (but can be taken daily if needed).    Food Choices for Gastroesophageal Reflux Disease, Adult When you have gastroesophageal reflux disease (GERD), the foods you eat and your eating habits are very important. Choosing the right foods can help ease your discomfort. What guidelines do I need to follow?  Choose fruits, vegetables, whole grains, and low-fat dairy products.  Choose low-fat meat, fish, and poultry.  Limit fats such as oils, salad dressings, butter, nuts, and avocado.  Keep a food diary. This helps you identify foods that cause symptoms.  Avoid foods that cause symptoms. These may be different for everyone.  Eat small meals often instead of 3 large meals a day.  Eat your meals slowly, in a place where you are relaxed.  Limit fried foods.  Cook foods using methods other than frying.  Avoid drinking alcohol.  Avoid drinking large amounts of liquids with your meals.  Avoid bending over or lying down until 2-3 hours after eating. What foods are not recommended? These are some foods and drinks that may make your symptoms worse: Vegetables Tomatoes. Tomato juice. Tomato and spaghetti sauce. Chili peppers. Onion and garlic. Horseradish. Fruits Oranges, grapefruit, and lemon (fruit and juice). Meats High-fat meats, fish, and poultry. This includes hot dogs, ribs, ham, sausage, salami, and bacon. Dairy Whole milk and chocolate milk. Sour cream. Cream. Butter. Ice cream. Cream cheese. Drinks Coffee and tea. Bubbly (carbonated) drinks or energy drinks. Condiments Hot sauce. Barbecue sauce. Sweets/Desserts Chocolate and cocoa. Donuts. Peppermint and spearmint. Fats and Oils High-fat foods. This includes [place] fries and potato chips. Other Vinegar. Strong spices. This includes black pepper, white pepper, red pepper, cayenne, curry powder, cloves, ginger,  and chili powder. The items listed above may not be a complete list of foods and drinks to avoid. Contact your dietitian for more information. This information is not intended to replace advice given to you by your health care provider. Make sure you discuss any questions you have with your health care provider. Document Released: 11/09/2011 Document Revised: 10/16/2015 Document Reviewed: 03/14/2013 Elsevier Interactive Patient Education  2017 [employer].    IF you received an x-ray today, you will receive an invoice from [facility]. Please contact [facility] at [phone] with questions or concerns regarding your invoice.   IF you received labwork today, you will receive an invoice from [facility]. Please contact [facility] at [phone] with questions or concerns regarding your invoice.   Our billing staff will not be able to assist you with questions regarding bills from these companies.  You will be contacted with the lab results as soon as they are available. The fastest way to get your results is to activate your My Chart account. Instructions are located on the last page of this paperwork. If you have not heard from [place] regarding the results in 2 weeks, please contact this office.

## 2017-06-08 LAB — COMPREHENSIVE METABOLIC PANEL
ALBUMIN: 4.1 g/dL (ref 3.5–5.5)
ALK PHOS: 86 IU/L (ref 39–117)
ALT: 24 IU/L (ref 0–44)
AST: 26 IU/L (ref 0–40)
Albumin/Globulin Ratio: 1.1 — ABNORMAL LOW (ref 1.2–2.2)
BILIRUBIN TOTAL: 0.3 mg/dL (ref 0.0–1.2)
BUN / CREAT RATIO: 11 (ref 9–20)
BUN: 9 mg/dL (ref 6–24)
CHLORIDE: 95 mmol/L — AB (ref 96–106)
CO2: 17 mmol/L — AB (ref 20–29)
Calcium: 9 mg/dL (ref 8.7–10.2)
Creatinine, Ser: 0.81 mg/dL (ref 0.76–1.27)
GFR calc Af Amer: 115 mL/min/{1.73_m2} (ref >59)
GFR calc non Af Amer: 99 mL/min/{1.73_m2} (ref >59)
GLUCOSE: 159 mg/dL — AB (ref 65–99)
Globulin, Total: 3.6 g/dL (ref 1.5–4.5)
Potassium: 3.9 mmol/L (ref 3.5–5.2)
Sodium: 137 mmol/L (ref 134–144)
Total Protein: 7.7 g/dL (ref 6.0–8.5)

## 2017-06-08 LAB — LIPID PANEL
CHOLESTEROL TOTAL: 190 mg/dL (ref 100–199)
Chol/HDL Ratio: 4 ratio (ref 0.0–5.0)
HDL: 48 mg/dL (ref >39)
LDL Calculated: 108 mg/dL — ABNORMAL HIGH (ref 0–99)
TRIGLYCERIDES: 169 mg/dL — AB (ref 0–149)
VLDL CHOLESTEROL CAL: 34 mg/dL (ref 5–40)

## 2017-06-08 LAB — VITAMIN B12: VITAMIN B 12: 511 pg/mL (ref 232–1245)

## 2017-06-08 LAB — MAGNESIUM: MAGNESIUM: 1.5 mg/dL — AB (ref 1.6–2.3)

## 2017-06-12 ENCOUNTER — Other Ambulatory Visit: Payer: Self-pay | Admitting: Family Medicine

## 2017-06-12 MED ORDER — MAGNESIUM CHLORIDE 64 MG PO TBEC
1.0000 | DELAYED_RELEASE_TABLET | Freq: Every day | ORAL | 1 refills | Status: DC
Start: 2017-06-12 — End: 2017-12-29

## 2017-06-12 NOTE — Progress Notes (Signed)
See lab notes

## 2017-06-27 ENCOUNTER — Ambulatory Visit: Payer: Managed Care, Other (non HMO) | Admitting: Internal Medicine

## 2017-06-27 NOTE — Progress Notes (Deleted)
Subjective:     Patient ID: Raymond Pugh, male   DOB: 1960/07/20, 57 y.o.   MRN: [id number]  Diabetes    Mr. Marcussen is a 57 y.o. man,returning for f/u for management of DM2, dx 2008, uncontrolled, insulin-dependent, with complications (peripheral neuropathy). Last visit 3 mo ago.  At last visit, he was missing many insulin doses.  Last hemoglobin A1c: Lab Results  Component Value Date   HGBA1C 10.1 03/25/2017   HGBA1C 9.2 12/22/2016   HGBA1C 10.1 07/20/2016   He is now on: - Metformin 1000 mg 2x a day - Lantus 56 units at night - R insulin -30 minutes before a meal: - 20 units before a smaller meal - 24-26 units before a regular meal - 28 units before a larger meal Do not take more than 5 units of R insulin for correction. Previously on Glipizide. He has hypoglycemia awareness at <90.  He checks his sugars 2x a day: - a.m.:119-330 >> 116-174 (+ Lantus), 200-378 (no Lantus) - 2h after b'fast: 181-273 >> 170-272 - before lunch:109, 173-374, 481 >> see above - 2h after lunch: 157-321 >> 71-254, 368 - before dinner: 106, 234-311 >> 180-297 - after dinner:84, 117, 183-327, 427 >> 192-441, 598 - bedtime: 150-170 >> 279, 350 >> see above Highest: 598 >> ***. Lowest: 71 (mid-day) >> ***. He had sugars of 600s in 12/2015 as he felt low and started to drink juice without checking his sugars!   -No CKD. Last BUN/Cr: Lab Results  Component Value Date   BUN 9 06/07/2017   Lab Results  Component Value Date   CREATININE 0.81 06/07/2017  On ramipril. -+ HL; last lipids: Lab Results  Component Value Date   CHOL 190 06/07/2017   HDL 48 06/07/2017   LDLCALC 108 (H) 06/07/2017   TRIG 169 (H) 06/07/2017   CHOLHDL 4.0 06/07/2017  On lovastatin. - Last eye exam: 01/2017 (My eye doctor).  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 He has gait aches almost every weekend.  Review of  Systems Constitutional: no weight gain/no weight loss, no fatigue, no subjective hyperthermia, no subjective hypothermia Eyes: no blurry vision, no xerophthalmia ENT: no sore throat, no nodules palpated in throat, no dysphagia, no odynophagia, no hoarseness Cardiovascular: no CP/no SOB/no palpitations/no leg swelling Respiratory: no cough/no SOB/no wheezing Gastrointestinal: no N/no V/no D/no C/no acid reflux Musculoskeletal: no muscle aches/no joint aches Skin: no rashes, no hair loss Neurological: no tremors/no numbness/no tingling/no dizziness  I reviewed pt's medications, allergies, PMH, social hx, family hx, and changes were documented in the history of present illness. Otherwise, unchanged from my initial visit note.   Objective:   Physical Exam There were no vitals taken for this visit. There is no height or weight on file to calculate BMI. Wt Readings from Last 3 Encounters:  06/07/17 226 lb (102.5 kg)  03/25/17 229 lb (103.9 kg)  12/22/16 233 lb (105.7 kg)   Constitutional: overweight, in NAD Eyes: PERRLA, EOMI, no exophthalmos ENT: moist mucous membranes, no thyromegaly, no cervical lymphadenopathy Cardiovascular: RRR, No MRG Respiratory: CTA B Gastrointestinal: abdomen soft, NT, ND, BS+ Musculoskeletal: no deformities, strength intact in all 4 Skin: moist, warm, no rashes Neurological: no tremor with outstretched hands, DTR normal in all 4  Assessment:     1.  DM2, uncontrolled, insulin-dependent, with complications - peripheral neuropathy - stable  Plan:     1.  Pt with long-standing, uncontrolled, type 2 diabetes, with medication noncompliance.  At last visit, he was missing many doses of insulin, especially Lantus, as he was falling asleep at night before taking it.  I advised him to move it around dinnertime.  He was also taking a regular insulin dose at bedtime (20 units) if sugars were higher.  She was having mild lows overnight >> advised not to take more than  5 units of regular insulin for correction at bedtime.  His main problem was compliance and we discussed at length about improving this, but we could not change the doses of his insulin at last visit.  - I advised him to: Patient Instructions  Please continue: - Metformin 1000 mg 2x a day - Lantus 56 units at night - R insulin -30 minutes before a meal: - 20 units before a smaller meal - 24-26 units before a regular meal - 28 units before a larger meal DO NOT MISS ANY DOSES! Do not take more than 5 units of R insulin for correction.  Please return in 3 months with your sugar log.   - today, HbA1c is 7%  - continue checking sugars at different times of the day - check 3x a day, rotating checks - advised for yearly eye exams >> he is up-to-date - Return to clinic in 3 mo with sugar log   2. HL -Reviewed latest lipid panel from last month: LDL worse -Continues lovastatin without side effects -Discussed about the importance of a low-fat diet  [name], MD PhD [facility] Endocrinology

## 2017-07-17 ENCOUNTER — Other Ambulatory Visit: Payer: Self-pay | Admitting: Internal Medicine

## 2017-08-30 ENCOUNTER — Ambulatory Visit: Payer: Managed Care, Other (non HMO) | Admitting: Internal Medicine

## 2017-08-30 DIAGNOSIS — Z0289 Encounter for other administrative examinations: Secondary | ICD-10-CM

## 2017-08-31 ENCOUNTER — Other Ambulatory Visit: Payer: Self-pay | Admitting: Internal Medicine

## 2017-09-01 NOTE — Telephone Encounter (Signed)
Okay to refill? 

## 2017-09-01 NOTE — Telephone Encounter (Signed)
Last ov 03/25/17 1 no show and no upcoming appt, ok to refill?

## 2017-09-22 ENCOUNTER — Telehealth: Payer: Self-pay | Admitting: Internal Medicine

## 2017-09-22 NOTE — Telephone Encounter (Signed)
Patient wife called asking if patient can be referred to a foot doctor

## 2017-09-26 ENCOUNTER — Other Ambulatory Visit: Payer: Self-pay | Admitting: Internal Medicine

## 2017-09-26 DIAGNOSIS — E785 Hyperlipidemia, unspecified: Secondary | ICD-10-CM

## 2017-10-11 ENCOUNTER — Other Ambulatory Visit: Payer: Self-pay | Admitting: Internal Medicine

## 2017-10-15 ENCOUNTER — Other Ambulatory Visit: Payer: Self-pay | Admitting: Internal Medicine

## 2017-11-30 ENCOUNTER — Telehealth: Payer: Self-pay | Admitting: Family Medicine

## 2017-11-30 NOTE — Telephone Encounter (Addendum)
Copied from [place] [id number]. Topic: General - Other >> Nov 30, 2017 10:05 AM Lennox Solders wrote: Reason for CRM:pt was seen at er last night [facility]  and prescribed ondansetron odt 4 mg and needs a PA for QTY 12. [facility] . Pt received 8 pills last night from [facility]. Pt plan only allows 9 pills with out PA for QTY. Please call  [phone] to obtain PA

## 2017-12-02 NOTE — Telephone Encounter (Signed)
Please see note below. 

## 2017-12-03 ENCOUNTER — Other Ambulatory Visit: Payer: Self-pay | Admitting: Family Medicine

## 2017-12-03 DIAGNOSIS — I10 Essential (primary) hypertension: Secondary | ICD-10-CM

## 2017-12-06 NOTE — Telephone Encounter (Signed)
We cannot do a prior auth for medication we did not prescribe

## 2017-12-08 MED ORDER — GLUCOSE 40 % PO GEL
15.00 g | ORAL | Status: DC
Start: ? — End: 2017-12-08

## 2017-12-08 MED ORDER — SODIUM CHLORIDE 0.9 % IJ SOLN
5.00 | INTRAMUSCULAR | Status: DC
Start: 2017-12-07 — End: 2017-12-08

## 2017-12-08 MED ORDER — LOPERAMIDE HCL 2 MG PO CAPS
2.00 | ORAL_CAPSULE | ORAL | Status: DC
Start: ? — End: 2017-12-08

## 2017-12-08 MED ORDER — ATORVASTATIN CALCIUM 10 MG PO TABS
10.00 | ORAL_TABLET | ORAL | Status: DC
Start: 2017-12-08 — End: 2017-12-08

## 2017-12-08 MED ORDER — ALPRAZOLAM 0.25 MG PO TABS
0.25 | ORAL_TABLET | ORAL | Status: DC
Start: ? — End: 2017-12-08

## 2017-12-08 MED ORDER — METOPROLOL TARTRATE 50 MG PO TABS
100.00 | ORAL_TABLET | ORAL | Status: DC
Start: 2017-12-07 — End: 2017-12-08

## 2017-12-08 MED ORDER — GENERIC EXTERNAL MEDICATION
1.00 | Status: DC
Start: ? — End: 2017-12-08

## 2017-12-08 MED ORDER — MELATONIN 3 MG PO TABS
3.00 | ORAL_TABLET | ORAL | Status: DC
Start: ? — End: 2017-12-08

## 2017-12-08 MED ORDER — ONDANSETRON HCL 4 MG/2ML IJ SOLN
4.00 | INTRAMUSCULAR | Status: DC
Start: ? — End: 2017-12-08

## 2017-12-08 MED ORDER — CIPROFLOXACIN IN D5W 400 MG/200ML IV SOLN
400.00 | INTRAVENOUS | Status: DC
Start: 2017-12-08 — End: 2017-12-08

## 2017-12-08 MED ORDER — SODIUM CHLORIDE 0.9 % IJ SOLN
5.00 | INTRAMUSCULAR | Status: DC
Start: ? — End: 2017-12-08

## 2017-12-08 MED ORDER — DOCUSATE SODIUM 100 MG PO CAPS
100.00 | ORAL_CAPSULE | ORAL | Status: DC
Start: ? — End: 2017-12-08

## 2017-12-08 MED ORDER — DEXTROSE 50 % IV SOLN
12.00 g | INTRAVENOUS | Status: DC
Start: ? — End: 2017-12-08

## 2017-12-08 MED ORDER — MAGNESIUM OXIDE 400 MG PO TABS
400.00 | ORAL_TABLET | ORAL | Status: DC
Start: 2017-12-07 — End: 2017-12-08

## 2017-12-08 MED ORDER — INSULIN LISPRO 100 UNIT/ML ~~LOC~~ SOLN
2.00 | SUBCUTANEOUS | Status: DC
Start: 2017-12-08 — End: 2017-12-08

## 2017-12-08 MED ORDER — PANTOPRAZOLE SODIUM 40 MG PO TBEC
40.00 | DELAYED_RELEASE_TABLET | ORAL | Status: DC
Start: 2017-12-08 — End: 2017-12-08

## 2017-12-08 MED ORDER — GENERIC EXTERNAL MEDICATION
30.00 | Status: DC
Start: 2017-12-07 — End: 2017-12-08

## 2017-12-08 MED ORDER — ACETAMINOPHEN 325 MG PO TABS
650.00 | ORAL_TABLET | ORAL | Status: DC
Start: ? — End: 2017-12-08

## 2017-12-19 ENCOUNTER — Other Ambulatory Visit: Payer: Self-pay | Admitting: Internal Medicine

## 2017-12-19 DIAGNOSIS — E119 Type 2 diabetes mellitus without complications: Secondary | ICD-10-CM

## 2017-12-19 DIAGNOSIS — Z794 Long term (current) use of insulin: Principal | ICD-10-CM

## 2017-12-19 NOTE — Telephone Encounter (Signed)
OK 

## 2017-12-19 NOTE — Telephone Encounter (Signed)
Is this okay to refill? 

## 2017-12-29 ENCOUNTER — Ambulatory Visit (INDEPENDENT_AMBULATORY_CARE_PROVIDER_SITE_OTHER): Payer: Managed Care, Other (non HMO) | Admitting: Family Medicine

## 2017-12-29 ENCOUNTER — Encounter: Payer: Self-pay | Admitting: Family Medicine

## 2017-12-29 ENCOUNTER — Other Ambulatory Visit: Payer: Self-pay

## 2017-12-29 VITALS — BP 130/78 | HR 58 | Temp 97.9°F | Ht 68.0 in | Wt 213.0 lb

## 2017-12-29 DIAGNOSIS — N179 Acute kidney failure, unspecified: Secondary | ICD-10-CM

## 2017-12-29 DIAGNOSIS — E1165 Type 2 diabetes mellitus with hyperglycemia: Secondary | ICD-10-CM | POA: Diagnosis not present

## 2017-12-29 DIAGNOSIS — A045 Campylobacter enteritis: Secondary | ICD-10-CM

## 2017-12-29 DIAGNOSIS — N390 Urinary tract infection, site not specified: Secondary | ICD-10-CM | POA: Diagnosis not present

## 2017-12-29 DIAGNOSIS — E876 Hypokalemia: Secondary | ICD-10-CM

## 2017-12-29 DIAGNOSIS — Z794 Long term (current) use of insulin: Secondary | ICD-10-CM

## 2017-12-29 LAB — POC MICROSCOPIC URINALYSIS (UMFC): MUCUS RE: ABSENT

## 2017-12-29 LAB — POCT URINALYSIS DIP (MANUAL ENTRY)
Bilirubin, UA: NEGATIVE
Ketones, POC UA: NEGATIVE mg/dL
Leukocytes, UA: NEGATIVE
Nitrite, UA: NEGATIVE
Protein Ur, POC: 30 mg/dL — AB
RBC UA: NEGATIVE
SPEC GRAV UA: 1.02 (ref 1.010–1.025)
UROBILINOGEN UA: 0.2 U/dL
pH, UA: 7 (ref 5.0–8.0)

## 2017-12-29 LAB — GLUCOSE, POCT (MANUAL RESULT ENTRY): POC Glucose: 195 mg/dl — AB (ref 70–99)

## 2017-12-29 NOTE — Patient Instructions (Addendum)
It is very important for you to see your endocrinologist as soon as possible to discuss a plan for your insulin. Call her office to discuss current regimen. Make sure she knows about your low blood sugar readings as well. Do not skip meals. See precautions on hypoglycemia below.   I will recheck kidney test, potassium, magnesium today. Potassium rich foods for now.   Make sure to drink plenty of fluids, especially when working outside. If there is paperwork to complete, please bring that by and we can discuss details if needed.   Please follow up in next 2 weeks to review labs and chronic medications.   Return to the clinic or go to the nearest emergency room if any of your symptoms worsen or new symptoms occur.     Hypoglycemia Hypoglycemia occurs when the level of sugar (glucose) in the blood is too low. Glucose is a type of sugar that provides the body's main source of energy. Certain hormones (insulin and glucagon) control the level of glucose in the blood. Insulin lowers blood glucose, and glucagon increases blood glucose. Hypoglycemia can result from having too much insulin in the bloodstream, or from not eating enough food that contains glucose. Hypoglycemia can happen in people who do or do not have diabetes. It can develop quickly, and it can be a medical emergency. What are the causes? Hypoglycemia occurs most often in people who have diabetes. If you have diabetes, hypoglycemia may be caused by:  Diabetes medicine.  Not eating enough, or not eating often enough.  Increased physical activity.  Drinking alcohol, especially when you have not eaten recently.  If you do not have diabetes, hypoglycemia may be caused by:  A tumor in the pancreas. The pancreas is the organ that makes insulin.  Not eating enough, or not eating for long periods at a time (fasting).  Severe infection or illness that affects the liver, heart, or kidneys.  Certain medicines.  You may also have  reactive hypoglycemia. This condition causes hypoglycemia within 4 hours of eating a meal. This may occur after having stomach surgery. Sometimes, the cause of reactive hypoglycemia is not known. What increases the risk? Hypoglycemia is more likely to develop in:  People who have diabetes and take medicines to lower blood glucose.  People who abuse alcohol.  People who have a severe illness.  What are the signs or symptoms? Hypoglycemia may not cause any symptoms. If you have symptoms, they may include:  Hunger.  Anxiety.  Sweating and feeling clammy.  Confusion.  Dizziness or feeling light-headed.  Sleepiness.  Nausea.  Increased heart rate.  Headache.  Blurry vision.  Seizure.  Nightmares.  Tingling or numbness around the mouth, lips, or tongue.  A change in speech.  Decreased ability to concentrate.  A change in coordination.  Restless sleep.  Tremors or shakes.  Fainting.  Irritability.  How is this diagnosed? Hypoglycemia is diagnosed with a blood test to measure your blood glucose level. This blood test is done while you are having symptoms. Your health care provider may also do a physical exam and review your medical history. If you do not have diabetes, other tests may be done to find the cause of your hypoglycemia. How is this treated? This condition can often be treated by immediately eating or drinking something that contains glucose, such as:  3-4 sugar tablets (glucose pills).  Glucose gel, 15-gram tube.  Fruit juice, 4 oz (120 mL).  Regular soda (not diet soda), 4 oz (  120 mL).  Low-fat milk, 4 oz (120 mL).  Several pieces of hard candy.  Sugar or honey, 1 Tbsp.  Treating Hypoglycemia If You Have Diabetes  If you are alert and able to swallow safely, follow the 15:15 rule:  Take 15 grams of a rapid-acting carbohydrate. Rapid-acting options include: ? 1 tube of glucose gel. ? 3 glucose pills. ? 6-8 pieces of hard candy. ? 4  oz (120 mL) of fruit juice. ? 4 oz (120 ml) of regular (not diet) soda.  Check your blood glucose 15 minutes after you take the carbohydrate.  If the repeat blood glucose level is still at or below 70 mg/dL (3.9 mmol/L), take 15 grams of a carbohydrate again.  If your blood glucose level does not increase above 70 mg/dL (3.9 mmol/L) after 3 tries, seek emergency medical care.  After your blood glucose level returns to normal, eat a meal or a snack within 1 hour.  Treating Severe Hypoglycemia Severe hypoglycemia is when your blood glucose level is at or below 54 mg/dL (3 mmol/L). Severe hypoglycemia is an emergency. Do not wait to see if the symptoms will go away. Get medical help right away. Call your local emergency services (911 in the [place] Do not drive yourself to the hospital. If you have severe hypoglycemia and you cannot eat or drink, you may need an injection of glucagon. A family member or close friend should learn how to check your blood glucose and how to give you a glucagon injection. Ask your health care provider if you need to have an emergency glucagon injection kit available. Severe hypoglycemia may need to be treated in a hospital. The treatment may include getting glucose through an IV tube. You may also need treatment for the cause of your hypoglycemia. Follow these instructions at home: General instructions  Avoid any diets that cause you to not eat enough food. Talk with your health care provider before you start any new diet.  Take over-the-counter and prescription medicines only as told by your health care provider.  Limit alcohol intake to no more than 1 drink per day for nonpregnant women and 2 drinks per day for men. One drink equals 12 oz of beer, 5 oz of wine, or 1 oz of hard liquor.  Keep all follow-up visits as told by your health care provider. This is important. If You Have Diabetes:   Make sure you know the symptoms of hypoglycemia.  Always have a  rapid-acting carbohydrate snack with you to treat low blood sugar.  Follow your diabetes management plan, as told by your health care provider. Make sure you: ? Take your medicines as directed. ? Follow your exercise plan. ? Follow your meal plan. Eat on time, and do not skip meals. ? Check your blood glucose as often as directed. Make sure to check your blood glucose before and after exercise. If you exercise longer or in a different way than usual, check your blood glucose more often. ? Follow your sick day plan whenever you cannot eat or drink normally. Make this plan in advance with your health care provider.  Share your diabetes management plan with people in your workplace, school, and household.  Check your urine for ketones when you are ill and as told by your health care provider.  Carry a medical alert card or wear medical alert jewelry. If You Have Reactive Hypoglycemia or Low Blood Sugar From Other Causes:  Monitor your blood glucose as told by your health care  provider.  Follow instructions from your health care provider about eating or drinking restrictions. Contact a health care provider if:  You have problems keeping your blood glucose in your target range.  You have frequent episodes of hypoglycemia. Get help right away if:  You continue to have hypoglycemia symptoms after eating or drinking something containing glucose.  Your blood glucose is at or below 54 mg/dL (3 mmol/L).  You have a seizure.  You faint. These symptoms may represent a serious problem that is an emergency. Do not wait to see if the symptoms will go away. Get medical help right away. Call your local emergency services (911 in the [place] Do not drive yourself to the hospital. This information is not intended to replace advice given to you by your health care provider. Make sure you discuss any questions you have with your health care provider. Document Released: 05/10/2005 Document Revised:  10/22/2015 Document Reviewed: 06/13/2015 Elsevier Interactive Patient Education  2018 [employer].   IF you received an x-ray today, you will receive an invoice from [facility]. Please contact [facility] at [phone] with questions or concerns regarding your invoice.   IF you received labwork today, you will receive an invoice from [facility]. Please contact [facility] at [phone] with questions or concerns regarding your invoice.   Our billing staff will not be able to assist you with questions regarding bills from these companies.  You will be contacted with the lab results as soon as they are available. The fastest way to get your results is to activate your My Chart account. Instructions are located on the last page of this paperwork. If you have not heard from [place] regarding the results in 2 weeks, please contact this office.

## 2017-12-29 NOTE — Progress Notes (Signed)
Subjective:    Patient ID: Raymond Pugh, male    DOB: 07/19/1960, 57 y.o.   MRN: [id number]  HPI [name] is a 57 y.o. male Presents today for: Chief Complaint  Patient presents with  . Hospitalization Follow-up    f/u from the hospital and feels it was from food posining or over heated (mid july)   Here for hospital follow-up.  Admitted July 14 through July 17 at [facility], [facility] main hospital. Had diarrhea week before then overheated at work - lightheaded. Went to ER. Dianosed gastroenteritis with Campylobacter infection, and UTI with E. coli.  Treated with IV ciprofloxacin for both infections, diarrhea improved and was discharged on 5-day course of oral ciprofloxacin. No current frequency/urgency/dysuria. Has bump on genital that has improved with cream. No further diarrhea. Soft stools. No blood in the stool.  No fever. Feeling better. Took awhile to get energy level back, but feels back to baseline. Rare dizziness if bending over or standing up quickly - last week. Not today. Drinking fluids. Beer:3-4 per week.    Noted to have uncontrolled diabetes in hospital with hyperglycemia and A1c of 11.1.  Lantus was increased to 30 units nightly, metformin was initially held due to acute kidney injury.  He has been evaluated/treated in the past for diabetes by endocrinology, with some history of medication and specialist follow-up nonadherence. Endocrine: Dr. Cruzita Lederer. Glucose 339 July 16th,  Last appt with Dr. Cruzita Lederer in 03/2017. Recommended 3 month follow up. Has not scheduled follow up.  Was taking lantus 26 unuits, but not taking if sugar in the low 100's.  Symptomatic low few weeks ago at 40. (3 los in past 3-4 months) Taking 26 units of insulin but only if high enough at night (180-190). Skips dinner at times, last used lantus a week ago. Uses novolin R sometimes before meals. Taking metformin BID.    Diabetes: Lab Results  Component Value Date   HGBA1C 10.1 03/25/2017   Wt Readings from Last 3 Encounters:  12/29/17 213 lb (96.6 kg)  06/07/17 226 lb (102.5 kg)  03/25/17 229 lb (103.9 kg)    Acute kidney injury: Thought to be due to prerenal azotemia, dehydration from gastroenteritis.  Hydrated with IV fluids and discharge creatinine normalized.  Hypokalemia, hypomagnesemia: Treated with p.o. and IV magnesium as well as prescription for oral magnesium at discharge.  Discharge magnesium level 1.7. Potassium 3.1 up to 3.6 on discharge on 7/17 Out of magnesium past 3 days. Plans on otc treatment.  Taking potassium pill at times - unsure of dose, none in past few days. Otc.  No palpitations, no new myalgias.      Patient Active Problem List   Diagnosis Date Noted  . Liver abscess 06/24/2014  . Eyelid lesion 05/29/2012  . Obesity 08/25/2010  . BARRETTS ESOPHAGUS 07/03/2010  . Iron deficiency anemia, unspecified 11/16/2009  . Hyperlipidemia 11/04/2008  . ADHD 08/14/2007  . MOLE 03/13/2007  . Uncontrolled type 2 diabetes mellitus with peripheral angiopathy ([place]) 03/13/2007  . Essential hypertension 11/03/2006   Past Medical History:  Diagnosis Date  . ADHD (attention deficit hyperactivity disorder)   . Anemia   . Barrett's esophagus   . Diabetes mellitus   . Hiatal hernia   . Hyperlipemia   . Hypertension   . Myocarditis ([place])    h/o mypocarditis, caused cardiomyopathy-- resolved    Past Surgical History:  Procedure Laterality Date  . ABSCESS DRAIN LIVER PERC ([place] [facility]X)  klebsiella on liver pa states   . EYE SURGERY    . HYDROCELE EXCISION / REPAIR    . radiokeratotomy    . RETINAL DETACHMENT SURGERY     No Known Allergies Prior to Admission medications   Medication Sig Start Date End Date Taking? Authorizing Provider  ACCU-CHEK GUIDE test strip TEST FOUR TIMES DAILY AS DIRECTED 10/18/17  Yes [name], MD  hydrochlorothiazide (HYDRODIURIL) 25 MG tablet TAKE 1 TABLET(25 MG) BY MOUTH DAILY 12/03/17  Yes  Wendie Agreste, MD  Insulin Glargine (LANTUS SOLOSTAR) 100 UNIT/ML Solostar Pen Inject 52 Units into the skin daily at 10 pm. 12/22/16  Yes [name], MD  insulin regular (NOVOLIN R,HUMULIN R) 100 units/mL injection Inject 0.2-0.28 mLs (20-28 Units total) into the skin 3 (three) times daily before meals. 12/22/16  Yes [name], MD  Lancet Devices [facility]) lancets Use as instructed 05/25/17  Yes [name], MD  lovastatin (MEVACOR) 20 MG tablet TAKE 1 TABLET AT BEDTIME 09/27/17  Yes [name], MD  metFORMIN (GLUCOPHAGE) 1000 MG tablet TAKE 1 TABLET TWICE A DAY 12/19/17  Yes [name], MD  metoprolol tartrate (LOPRESSOR) 100 MG tablet TAKE 1 TABLET(100 MG) BY MOUTH TWICE DAILY 12/03/17  Yes Wendie Agreste, MD  NOVOLIN R RELION 100 UNIT/ML injection INJECT 16 TO 20 UNITS SUBCUTANEOUSLY THREE TIMES DAILY BEFORE MEAL(S) 01/31/17  Yes [name], MD  [facility] DELICA LANCETS FINE MISC Use to test blood sugar 4-6 times daily as instructed. Dx: E11.51 11/24/15  Yes Darlyne Russian, MD  pantoprazole (PROTONIX) 40 MG tablet TAKE 1 TABLET BY MOUTH EVERY MORNING BEFORE BREAKFAST 06/07/17  Yes Wendie Agreste, MD  ramipril (ALTACE) 10 MG capsule Take 1 capsule (10 mg total) by mouth 2 (two) times daily. 06/07/17  Yes Wendie Agreste, MD  RELION INSULIN SYRINGE 31G X 15/64" 0.5 ML MISC INJECT  THREE TIMES DAILY 10/11/17  Yes [name], MD  ALPRAZolam Duanne Moron) 0.5 MG tablet Take 1 tablet (0.5 mg total) by mouth 2 (two) times daily as needed for anxiety. Patient not taking: Reported on 12/29/2017 06/07/17   Wendie Agreste, MD  amphetamine-dextroamphetamine (ADDERALL XR) 20 MG 24 hr capsule Take 1 Capsules by mouth every day prn Patient not taking: Reported on 06/07/2017 11/24/15   Darlyne Russian, MD  BD PEN NEEDLE NANO U/F 32G X 4 MM MISC Use 4 times daily Patient not taking: Reported on 12/29/2017 11/24/15   Darlyne Russian, MD  BD PEN NEEDLE NANO U/F 32G X 4 MM  MISC AS DIRECTED FOUR TIMES DAILY 05/13/17   [name], MD   Social History   Socioeconomic History  . Marital status: Married    Spouse name: Not on file  . Number of children: 2  . Years of education: Not on file  . Highest education level: Not on file  Occupational History  . Occupation: Ship broker, part time job    Comment: Market researcher, Architect  Social Needs  . Financial resource strain: Not on file  . Food insecurity:    Worry: Not on file    Inability: Not on file  . Transportation needs:    Medical: Not on file    Non-medical: Not on file  Tobacco Use  . Smoking status: Never Smoker  . Smokeless tobacco: Never Used  Substance and Sexual Activity  . Alcohol use: Yes    Alcohol/week: 1.0 standard drinks    Types: 1 Cans of beer per week  Comment: socially   . Drug use: No  . Sexual activity: Not on file    Comment: not asked  Lifestyle  . Physical activity:    Days per week: Not on file    Minutes per session: Not on file  . Stress: Not on file  Relationships  . Social connections:    Talks on phone: Not on file    Gets together: Not on file    Attends religious service: Not on file    Active member of club or organization: Not on file    Attends meetings of clubs or organizations: Not on file    Relationship status: Not on file  . Intimate partner violence:    Fear of current or ex partner: Not on file    Emotionally abused: Not on file    Physically abused: Not on file    Forced sexual activity: Not on file  Other Topics Concern  . Not on file  Social History Narrative   2 children + 2 step children ---    Has a part time job, Ship broker    Exercise swimming    Review of Systems     Objective:   Physical Exam  Constitutional: He is oriented to person, place, and time. He appears well-developed and well-nourished.  HENT:  Head: Normocephalic and atraumatic.  Eyes: Pupils are equal, round, and reactive to light. EOM are normal.  Neck: No JVD  present. Carotid bruit is not present.  Cardiovascular: Normal rate, regular rhythm and normal heart sounds.  No murmur heard. Pulmonary/Chest: Effort normal and breath sounds normal. He has no rales.  Musculoskeletal: He exhibits no edema.  Neurological: He is alert and oriented to person, place, and time.  Skin: Skin is warm and dry.  Psychiatric: He has a normal mood and affect.  Vitals reviewed.  Vitals:   12/29/17 1509  BP: 130/78  Pulse: (!) 58  Temp: 97.9 F (36.6 C)  TempSrc: Oral  SpO2: 100%  Weight: 213 lb (96.6 kg)  Height: '5\' 8"'  (1.727 m)      Assessment & Plan:   PATE AYLWARD is a 57 y.o. male AKI (acute kidney injury) ([place]) - Plan: Basic metabolic panel Hypomagnesemia - Plan: Magnesium Hypokalemia - Plan: Basic metabolic panel Campylobacter diarrhea  -Campylobacter diarrhea, volume depletion resulting in acute kidney injury, hypomagnesemia and hypokalemia, repleted in the hospital.  Symptoms have improved and near baseline.    -BMP, magnesium levels obtained, magnesium level 1.2.  Potassium normal.  Will start Slow-Mag 1/day and recheck levels in 1 week (plan developed after visit when reviewing lab work).   Urinary tract infection without hematuria, site unspecified - Plan: POCT urinalysis dipstick, POCT Microscopic Urinalysis ([facility])  -Also treated in hospital.  Repeat urinalysis in office reassuring.  RTC precautions  Type 2 diabetes mellitus with hyperglycemia, with long-term current use of insulin ([place]) - Plan: POCT urinalysis dipstick, POCT glucose (manual entry)  -Uncontrolled with history of medication nonadherence and nonadherence to follow-up with endocrinologist.  Discussed concerns with intermittent dosing of insulin as well as concerns of hypoglycemia.  That may be related to intermittent skipping meals or late meals.  Stressed importance of meeting with endocrinologist to discuss regimen from here forward.  Also handout given on hypoglycemia  precautions.  Plans follow-up with me in the next few weeks to review other chronic medical problems.  Noted at end of visit he may have FMLA paperwork from his spouse from last hospitalization.  No orders of the defined types were placed in this encounter.  Patient Instructions   It is very important for you to see your endocrinologist as soon as possible to discuss a plan for your insulin. Call her office to discuss current regimen. Make sure she knows about your low blood sugar readings as well. Do not skip meals. See precautions on hypoglycemia below.   I will recheck kidney test, potassium, magnesium today. Potassium rich foods for now.   Make sure to drink plenty of fluids, especially when working outside. If there is paperwork to complete, please bring that by and we can discuss details if needed.   Please follow up in next 2 weeks to review labs and chronic medications.   Return to the clinic or go to the nearest emergency room if any of your symptoms worsen or new symptoms occur.     Hypoglycemia Hypoglycemia occurs when the level of sugar (glucose) in the blood is too low. Glucose is a type of sugar that provides the body's main source of energy. Certain hormones (insulin and glucagon) control the level of glucose in the blood. Insulin lowers blood glucose, and glucagon increases blood glucose. Hypoglycemia can result from having too much insulin in the bloodstream, or from not eating enough food that contains glucose. Hypoglycemia can happen in people who do or do not have diabetes. It can develop quickly, and it can be a medical emergency. What are the causes? Hypoglycemia occurs most often in people who have diabetes. If you have diabetes, hypoglycemia may be caused by:  Diabetes medicine.  Not eating enough, or not eating often enough.  Increased physical activity.  Drinking alcohol, especially when you have not eaten recently.  If you do not have diabetes,  hypoglycemia may be caused by:  A tumor in the pancreas. The pancreas is the organ that makes insulin.  Not eating enough, or not eating for long periods at a time (fasting).  Severe infection or illness that affects the liver, heart, or kidneys.  Certain medicines.  You may also have reactive hypoglycemia. This condition causes hypoglycemia within 4 hours of eating a meal. This may occur after having stomach surgery. Sometimes, the cause of reactive hypoglycemia is not known. What increases the risk? Hypoglycemia is more likely to develop in:  People who have diabetes and take medicines to lower blood glucose.  People who abuse alcohol.  People who have a severe illness.  What are the signs or symptoms? Hypoglycemia may not cause any symptoms. If you have symptoms, they may include:  Hunger.  Anxiety.  Sweating and feeling clammy.  Confusion.  Dizziness or feeling light-headed.  Sleepiness.  Nausea.  Increased heart rate.  Headache.  Blurry vision.  Seizure.  Nightmares.  Tingling or numbness around the mouth, lips, or tongue.  A change in speech.  Decreased ability to concentrate.  A change in coordination.  Restless sleep.  Tremors or shakes.  Fainting.  Irritability.  How is this diagnosed? Hypoglycemia is diagnosed with a blood test to measure your blood glucose level. This blood test is done while you are having symptoms. Your health care provider may also do a physical exam and review your medical history. If you do not have diabetes, other tests may be done to find the cause of your hypoglycemia. How is this treated? This condition can often be treated by immediately eating or drinking something that contains glucose, such as:  3-4 sugar tablets (glucose pills).  Glucose gel, 15-gram  tube.  Fruit juice, 4 oz (120 mL).  Regular soda (not diet soda), 4 oz (120 mL).  Low-fat milk, 4 oz (120 mL).  Several pieces of hard  candy.  Sugar or honey, 1 Tbsp.  Treating Hypoglycemia If You Have Diabetes  If you are alert and able to swallow safely, follow the 15:15 rule:  Take 15 grams of a rapid-acting carbohydrate. Rapid-acting options include: ? 1 tube of glucose gel. ? 3 glucose pills. ? 6-8 pieces of hard candy. ? 4 oz (120 mL) of fruit juice. ? 4 oz (120 ml) of regular (not diet) soda.  Check your blood glucose 15 minutes after you take the carbohydrate.  If the repeat blood glucose level is still at or below 70 mg/dL (3.9 mmol/L), take 15 grams of a carbohydrate again.  If your blood glucose level does not increase above 70 mg/dL (3.9 mmol/L) after 3 tries, seek emergency medical care.  After your blood glucose level returns to normal, eat a meal or a snack within 1 hour.  Treating Severe Hypoglycemia Severe hypoglycemia is when your blood glucose level is at or below 54 mg/dL (3 mmol/L). Severe hypoglycemia is an emergency. Do not wait to see if the symptoms will go away. Get medical help right away. Call your local emergency services (911 in the [place] Do not drive yourself to the hospital. If you have severe hypoglycemia and you cannot eat or drink, you may need an injection of glucagon. A family member or close friend should learn how to check your blood glucose and how to give you a glucagon injection. Ask your health care provider if you need to have an emergency glucagon injection kit available. Severe hypoglycemia may need to be treated in a hospital. The treatment may include getting glucose through an IV tube. You may also need treatment for the cause of your hypoglycemia. Follow these instructions at home: General instructions  Avoid any diets that cause you to not eat enough food. Talk with your health care provider before you start any new diet.  Take over-the-counter and prescription medicines only as told by your health care provider.  Limit alcohol intake to no more than 1 drink per  day for nonpregnant women and 2 drinks per day for men. One drink equals 12 oz of beer, 5 oz of wine, or 1 oz of hard liquor.  Keep all follow-up visits as told by your health care provider. This is important. If You Have Diabetes:   Make sure you know the symptoms of hypoglycemia.  Always have a rapid-acting carbohydrate snack with you to treat low blood sugar.  Follow your diabetes management plan, as told by your health care provider. Make sure you: ? Take your medicines as directed. ? Follow your exercise plan. ? Follow your meal plan. Eat on time, and do not skip meals. ? Check your blood glucose as often as directed. Make sure to check your blood glucose before and after exercise. If you exercise longer or in a different way than usual, check your blood glucose more often. ? Follow your sick day plan whenever you cannot eat or drink normally. Make this plan in advance with your health care provider.  Share your diabetes management plan with people in your workplace, school, and household.  Check your urine for ketones when you are ill and as told by your health care provider.  Carry a medical alert card or wear medical alert jewelry. If You Have Reactive Hypoglycemia or Low  Blood Sugar From Other Causes:  Monitor your blood glucose as told by your health care provider.  Follow instructions from your health care provider about eating or drinking restrictions. Contact a health care provider if:  You have problems keeping your blood glucose in your target range.  You have frequent episodes of hypoglycemia. Get help right away if:  You continue to have hypoglycemia symptoms after eating or drinking something containing glucose.  Your blood glucose is at or below 54 mg/dL (3 mmol/L).  You have a seizure.  You faint. These symptoms may represent a serious problem that is an emergency. Do not wait to see if the symptoms will go away. Get medical help right away. Call your  local emergency services (911 in the [place] Do not drive yourself to the hospital. This information is not intended to replace advice given to you by your health care provider. Make sure you discuss any questions you have with your health care provider. Document Released: 05/10/2005 Document Revised: 10/22/2015 Document Reviewed: 06/13/2015 Elsevier Interactive Patient Education  2018 [employer].   IF you received an x-ray today, you will receive an invoice from [facility]. Please contact [facility] at [phone] with questions or concerns regarding your invoice.   IF you received labwork today, you will receive an invoice from [facility]. Please contact [facility] at [phone] with questions or concerns regarding your invoice.   Our billing staff will not be able to assist you with questions regarding bills from these companies.  You will be contacted with the lab results as soon as they are available. The fastest way to get your results is to activate your My Chart account. Instructions are located on the last page of this paperwork. If you have not heard from [place] regarding the results in 2 weeks, please contact this office.       Signed,   [name], MD [facility] [place].  01/01/18 2:59 PM

## 2017-12-30 LAB — BASIC METABOLIC PANEL
BUN / CREAT RATIO: 17 (ref 9–20)
BUN: 14 mg/dL (ref 6–24)
CHLORIDE: 99 mmol/L (ref 96–106)
CO2: 22 mmol/L (ref 20–29)
Calcium: 9.7 mg/dL (ref 8.7–10.2)
Creatinine, Ser: 0.82 mg/dL (ref 0.76–1.27)
GFR, EST AFRICAN AMERICAN: 114 mL/min/{1.73_m2} (ref >59)
GFR, EST NON AFRICAN AMERICAN: 99 mL/min/{1.73_m2} (ref >59)
Glucose: 195 mg/dL — ABNORMAL HIGH (ref 65–99)
POTASSIUM: 3.5 mmol/L (ref 3.5–5.2)
Sodium: 141 mmol/L (ref 134–144)

## 2017-12-30 LAB — MAGNESIUM: MAGNESIUM: 1.2 mg/dL — AB (ref 1.6–2.3)

## 2018-01-01 ENCOUNTER — Other Ambulatory Visit: Payer: Self-pay | Admitting: Family Medicine

## 2018-01-01 ENCOUNTER — Encounter: Payer: Self-pay | Admitting: Family Medicine

## 2018-01-01 MED ORDER — MAGNESIUM CHLORIDE 64 MG PO TBEC: 1.0000 | DELAYED_RELEASE_TABLET | Freq: Every day | ORAL | 0 refills | Status: DC

## 2018-01-01 NOTE — Progress Notes (Signed)
See lab notes

## 2018-01-03 ENCOUNTER — Telehealth: Payer: Self-pay | Admitting: Family Medicine

## 2018-01-03 NOTE — Telephone Encounter (Signed)
Patient needs FMLA forms completed for his wife- she has already filled in most of the forms so I wasn't sure if this needed to be changed of not. Please look it over and correct anything that you see is wrong. I will place the forms in Dr Vonna Kotyk box on 01/03/18 please return to the FMLA/Disability box at the [employer] checkout desk within 5-7 business days, thank you.

## 2018-01-09 NOTE — Telephone Encounter (Signed)
Completed and placed in FMLA box.  

## 2018-01-10 NOTE — Telephone Encounter (Signed)
Forms scanned and faxed on 01/10/18

## 2018-01-16 ENCOUNTER — Ambulatory Visit: Payer: Managed Care, Other (non HMO) | Admitting: Family Medicine

## 2018-01-25 ENCOUNTER — Other Ambulatory Visit: Payer: Self-pay

## 2018-01-25 ENCOUNTER — Encounter: Payer: Self-pay | Admitting: Family Medicine

## 2018-01-25 ENCOUNTER — Ambulatory Visit (INDEPENDENT_AMBULATORY_CARE_PROVIDER_SITE_OTHER): Payer: Managed Care, Other (non HMO) | Admitting: Family Medicine

## 2018-01-25 VITALS — BP 148/78 | HR 61 | Temp 97.8°F | Ht 70.0 in | Wt 221.8 lb

## 2018-01-25 DIAGNOSIS — I10 Essential (primary) hypertension: Secondary | ICD-10-CM

## 2018-01-25 DIAGNOSIS — E785 Hyperlipidemia, unspecified: Secondary | ICD-10-CM | POA: Diagnosis not present

## 2018-01-25 DIAGNOSIS — E1165 Type 2 diabetes mellitus with hyperglycemia: Secondary | ICD-10-CM

## 2018-01-25 DIAGNOSIS — Z794 Long term (current) use of insulin: Secondary | ICD-10-CM | POA: Diagnosis not present

## 2018-01-25 DIAGNOSIS — K219 Gastro-esophageal reflux disease without esophagitis: Secondary | ICD-10-CM

## 2018-01-25 LAB — GLUCOSE, POCT (MANUAL RESULT ENTRY): POC Glucose: 227 mg/dl — AB (ref 70–99)

## 2018-01-25 MED ORDER — HYDROCHLOROTHIAZIDE 25 MG PO TABS: ORAL_TABLET | ORAL | 1 refills | Status: DC

## 2018-01-25 MED ORDER — RAMIPRIL 10 MG PO CAPS: 10.0000 mg | ORAL_CAPSULE | Freq: Two times a day (BID) | ORAL | 1 refills | Status: DC

## 2018-01-25 MED ORDER — PANTOPRAZOLE SODIUM 40 MG PO TBEC: DELAYED_RELEASE_TABLET | ORAL | 0 refills | Status: DC

## 2018-01-25 MED ORDER — METOPROLOL TARTRATE 100 MG PO TABS: ORAL_TABLET | ORAL | 1 refills | Status: DC

## 2018-01-25 NOTE — Progress Notes (Signed)
Subjective:  By signing my name below, I, Essence Howell, attest that this documentation has been prepared under the direction and in the presence of Wendie Agreste, MD Electronically Signed: Ladene Artist, ED Scribe 01/25/2018 at 10:05 AM.   Patient ID: Raymond Pugh, male    DOB: [birth year], 57 y.o.   MRN: [id number]  Chief Complaint  Patient presents with  . Medication Refill    hctz and all the others meds refilled   HPI Raymond Pugh is a 57 y.o. male who presents to Primary Care at [facility] for med refill. Last seen 8/8 for hospital f/u, gastroenteritis with campylobacter and UTI with E.coli. Secondary AKI from dehydration that resolved with fluids. Hypomagnesemia with level of 1.2 at last OV on 8/8. Started on slow mag 1 per day 64 mg/day 2 wks worth with plan to recheck during that time. - Pt states that he is feeling well overall. Denies heart palpitations, skipping beats, diarrhea, difficulty urinating.  DM Uncontrolled with h/o med non-adherence. Variable readings last OV including reported hypoglycemia. Intermittent dosing of insulin depending on nighttime readings but was also skipping dinner at times. Had not followed up with endo since 03/2017. Stressed the importance of regular meals and advised him to call her office to discuss regular regimen as he was using lantus 26 units, novolin R sometimes with meals, metformin 1000 mg bid. A1C 11.1 during hospitalization in July. Next appointment with Dr. Cruzita Lederer on 10/1. - Pt states that he has been skipping less meals but is still averaging 2 meals/day and has been checking his blood glucose more regularly. He is using lantus at night if blood glucose is above 160s (moreso 200) which he takes about 2 days/wk. Takes 20-30 units of novolin R 2-3 times/day with meals. Takes 30 units if reading is above 200, 20 units for below 200. Highest reading: high 200s-low 300s, lowest: 90 after dinner/before bed. He does report 2 hypoglycemic episodes  during the night.  HTN Lab Results  Component Value Date   CREATININE 0.82 12/29/2017   BP Readings from Last 3 Encounters:  01/25/18 (!) 148/78  12/29/17 130/78  06/07/17 136/78  HCTZ 25 mg qd, metoprolol 100 mg bid, altace 10 mg bid. - Pt has not taken his BP meds yet today; states he was "fasting". He ran out of HCTZ last wk.  Hyperlipidemia Lab Results  Component Value Date   CHOL 190 06/07/2017   HDL 48 06/07/2017   LDLCALC 108 (H) 06/07/2017   TRIG 169 (H) 06/07/2017   CHOLHDL 4.0 06/07/2017   Lab Results  Component Value Date   ALT 24 06/07/2017   AST 26 06/07/2017   ALKPHOS 86 06/07/2017   BILITOT 0.3 06/07/2017  Lovastatin 20 mg qd.  Patient Active Problem List   Diagnosis Date Noted  . Liver abscess 06/24/2014  . Eyelid lesion 05/29/2012  . Obesity 08/25/2010  . BARRETTS ESOPHAGUS 07/03/2010  . Iron deficiency anemia, unspecified 11/16/2009  . Hyperlipidemia 11/04/2008  . ADHD 08/14/2007  . MOLE 03/13/2007  . Uncontrolled type 2 diabetes mellitus with peripheral angiopathy ([place]) 03/13/2007  . Essential hypertension 11/03/2006   Past Medical History:  Diagnosis Date  . ADHD (attention deficit hyperactivity disorder)   . Anemia   . Barrett's esophagus   . Diabetes mellitus   . Hiatal hernia   . Hyperlipemia   . Hypertension   . Myocarditis ([place])    h/o mypocarditis, caused cardiomyopathy-- resolved    Past Surgical History:  Procedure  Laterality Date  . ABSCESS DRAIN LIVER PERC ([place] HX)     klebsiella on liver pa states   . EYE SURGERY    . HYDROCELE EXCISION / REPAIR    . radiokeratotomy    . RETINAL DETACHMENT SURGERY     No Known Allergies Prior to Admission medications   Medication Sig Start Date End Date Taking? Authorizing Provider  ACCU-CHEK GUIDE test strip TEST FOUR TIMES DAILY AS DIRECTED 10/18/17  Yes [name], MD  ALPRAZolam Duanne Moron) 0.5 MG tablet Take 1 tablet (0.5 mg total) by mouth 2 (two) times daily as needed for  anxiety. 06/07/17  Yes Wendie Agreste, MD  amphetamine-dextroamphetamine (ADDERALL XR) 20 MG 24 hr capsule Take 1 Capsules by mouth every day prn 11/24/15  Yes Daub, Loura Back, MD  BD PEN NEEDLE NANO U/F 32G X 4 MM MISC Use 4 times daily 11/24/15  Yes Daub, Loura Back, MD  BD PEN NEEDLE NANO U/F 32G X 4 MM MISC AS DIRECTED FOUR TIMES DAILY 05/13/17  Yes [name], MD  hydrochlorothiazide (HYDRODIURIL) 25 MG tablet TAKE 1 TABLET(25 MG) BY MOUTH DAILY 12/03/17  Yes Wendie Agreste, MD  Insulin Glargine (LANTUS SOLOSTAR) 100 UNIT/ML Solostar Pen Inject 52 Units into the skin daily at 10 pm. 12/22/16  Yes [name], MD  insulin regular (NOVOLIN R,HUMULIN R) 100 units/mL injection Inject 0.2-0.28 mLs (20-28 Units total) into the skin 3 (three) times daily before meals. 12/22/16  Yes [name], MD  Lancet Devices [facility]) lancets Use as instructed 05/25/17  Yes [name], MD  lovastatin (MEVACOR) 20 MG tablet TAKE 1 TABLET AT BEDTIME 09/27/17  Yes [name], MD  metFORMIN (GLUCOPHAGE) 1000 MG tablet TAKE 1 TABLET TWICE A DAY 12/19/17  Yes [name], MD  metoprolol tartrate (LOPRESSOR) 100 MG tablet TAKE 1 TABLET(100 MG) BY MOUTH TWICE DAILY 12/03/17  Yes Wendie Agreste, MD  NOVOLIN R RELION 100 UNIT/ML injection INJECT 16 TO 20 UNITS SUBCUTANEOUSLY THREE TIMES DAILY BEFORE MEAL(S) 01/31/17  Yes [name], MD  [facility] DELICA LANCETS FINE MISC Use to test blood sugar 4-6 times daily as instructed. Dx: E11.51 11/24/15  Yes Darlyne Russian, MD  pantoprazole (PROTONIX) 40 MG tablet TAKE 1 TABLET BY MOUTH EVERY MORNING BEFORE BREAKFAST 06/07/17  Yes Wendie Agreste, MD  ramipril (ALTACE) 10 MG capsule Take 1 capsule (10 mg total) by mouth 2 (two) times daily. 06/07/17  Yes Wendie Agreste, MD  RELION INSULIN SYRINGE 31G X 15/64" 0.5 ML MISC INJECT  THREE TIMES DAILY 10/11/17  Yes [name], MD   Social History   Socioeconomic History  . Marital  status: Married    Spouse name: Not on file  . Number of children: 2  . Years of education: Not on file  . Highest education level: Not on file  Occupational History  . Occupation: Ship broker, part time job    Comment: Market researcher, Architect  Social Needs  . Financial resource strain: Not on file  . Food insecurity:    Worry: Not on file    Inability: Not on file  . Transportation needs:    Medical: Not on file    Non-medical: Not on file  Tobacco Use  . Smoking status: Never Smoker  . Smokeless tobacco: Never Used  Substance and Sexual Activity  . Alcohol use: Yes    Alcohol/week: 1.0 standard drinks    Types: 1 Cans of beer per week    Comment: socially   .  Drug use: No  . Sexual activity: Not on file    Comment: not asked  Lifestyle  . Physical activity:    Days per week: Not on file    Minutes per session: Not on file  . Stress: Not on file  Relationships  . Social connections:    Talks on phone: Not on file    Gets together: Not on file    Attends religious service: Not on file    Active member of club or organization: Not on file    Attends meetings of clubs or organizations: Not on file    Relationship status: Not on file  . Intimate partner violence:    Fear of current or ex partner: Not on file    Emotionally abused: Not on file    Physically abused: Not on file    Forced sexual activity: Not on file  Other Topics Concern  . Not on file  Social History Narrative   2 children + 2 step children ---    Has a part time job, student    Exercise swimming   Review of Systems  Constitutional: Negative for fatigue and unexpected weight change.  Eyes: Negative for visual disturbance.  Respiratory: Negative for cough, chest tightness and shortness of breath.   Cardiovascular: Negative for chest pain, palpitations and leg swelling.  Gastrointestinal: Negative for abdominal pain, blood in stool and diarrhea.  Genitourinary: Negative for difficulty urinating.    Neurological: Negative for dizziness, light-headedness and headaches.      Objective:   Physical Exam  Constitutional: He is oriented to person, place, and time. He appears well-developed and well-nourished.  HENT:  Head: Normocephalic and atraumatic.  Eyes: Pupils are equal, round, and reactive to light. EOM are normal.  Neck: No JVD present. Carotid bruit is not present.  Cardiovascular: Normal rate, regular rhythm and normal heart sounds.  No murmur heard. Pulmonary/Chest: Effort normal and breath sounds normal. He has no rales.  Musculoskeletal: He exhibits no edema.  Neurological: He is alert and oriented to person, place, and time.  Skin: Skin is warm and dry.  Psychiatric: He has a normal mood and affect.  Vitals reviewed.  Vitals:   01/25/18 0920 01/25/18 0927  BP: (!) 166/84 (!) 148/78  Pulse: 61   Temp: 97.8 F (36.6 C)   TempSrc: Oral   SpO2: 98%   Weight: 221 lb 12.8 oz (100.6 kg)   Height: 5\' 10"  (1.778 m)       Assessment & Plan:   Raymond Pugh is a 57 y.o. male Type 2 diabetes mellitus with hyperglycemia, with long-term current use of insulin ([place]) - Plan: POCT glucose (manual entry)  -Still with variability, and intermittent dosing of long-acting insulin yet 40 to 60 mg of mealtime coverage.  -Stressed importance of not missing meals  -Trial of lower dose of Lantus at 15 units/day for now with plan to slowly increase as needed for improved control.  -Decrease mealtime coverage to 15 units if reading less than 200 (as he previously would use 20 units), and 20 units for higher readings (prior used 30).   - keep appt with endocrine as planned.  - hypoglycemia precautions given.     Essential hypertension - Plan: hydrochlorothiazide (HYDRODIURIL) 25 MG tablet, metoprolol tartrate (LOPRESSOR) 100 MG tablet, ramipril (ALTACE) 10 MG capsule  - decreased control, but off one med. Restart prior regimen, recheck in next 1 month.   Hypomagnesemia - Plan:  Magnesium level to determine supplement  need.   Hyperlipidemia, unspecified hyperlipidemia type - Plan: Comprehensive metabolic panel, Lipid panel  - tolerating statin - labs pending.   Gastroesophageal reflux disease, esophagitis presence not specified - Plan: pantoprazole (PROTONIX) 40 MG tablet  - option of QOD dosing if stable control. Refilled meds.   Meds ordered this encounter  Medications  . hydrochlorothiazide (HYDRODIURIL) 25 MG tablet    Sig: TAKE 1 TABLET(25 MG) BY MOUTH DAILY    Dispense:  90 tablet    Refill:  1  . metoprolol tartrate (LOPRESSOR) 100 MG tablet    Sig: TAKE 1 TABLET(100 MG) BY MOUTH TWICE DAILY    Dispense:  180 tablet    Refill:  1  . pantoprazole (PROTONIX) 40 MG tablet    Sig: TAKE 1 TABLET BY MOUTH EVERY MORNING BEFORE BREAKFAST    Dispense:  90 tablet    Refill:  0  . ramipril (ALTACE) 10 MG capsule    Sig: Take 1 capsule (10 mg total) by mouth 2 (two) times daily.    Dispense:  180 capsule    Refill:  1   Patient Instructions     I will check magnesium level again today.  If low, will need to restart supplement.   Make sure to continue eating regular meals.  lantus 15 units every night for now to lessen risk of spiking or dropping blood sugars, and more consistent control.  Continue insulin with meals, but can try lower mealtime coverage (15 units if less than 200, 20 if greater than 200).  This is if you are going to eat a regular meal. Still watch for low blood sugars and let me know if that occurs. Keep follow up with endocrinologist as planned.   No change in blood pressure medications for now, I expect the numbers to improve back on hydrochlorothiazide.  As we discussed ultimately I would like to change from beta-blockers to possibly other class but can discuss that further next visit.  Make sure you watch for any low blood sugar symptoms as the beta-blocker metoprolol can sometimes suppress those.   Continue same dose of cholesterol  medication, I will check those labs.  Acid blocker was refilled, if symptoms are controlled with every other day dosing, that may be less likely to cause nutritional deficiencies.  Thank you for coming in today, recheck in 1 month.  Return to the clinic or go to the nearest emergency room if any of your symptoms worsen or new symptoms occur.   If you have lab work done today you will be contacted with your lab results within the next 2 weeks.  If you have not heard from [place] then please contact us. The fastest way to get your results is to register for My Chart.   IF you received an x-ray today, you will receive an invoice from [facility]. Please contact [facility] at [phone] with questions or concerns regarding your invoice.   IF you received labwork today, you will receive an invoice from [place]. Please contact [facility] at [phone] with questions or concerns regarding your invoice.   Our billing staff will not be able to assist you with questions regarding bills from these companies.  You will be contacted with the lab results as soon as they are available. The fastest way to get your results is to activate your My Chart account. Instructions are located on the last page of this paperwork. If you have not heard from [place] regarding the results in 2 weeks,  please contact this office.       I personally performed the services described in this documentation, which was scribed in my presence. The recorded information has been reviewed and considered for accuracy and completeness, addended by me as needed, and agree with information above.  Signed,   [name], MD Primary [facility] at [facility].  01/25/18 11:40 AM

## 2018-01-25 NOTE — Patient Instructions (Addendum)
   I will check magnesium level again today.  If low, will need to restart supplement.   Make sure to continue eating regular meals.  lantus 15 units every night for now to lessen risk of spiking or dropping blood sugars, and more consistent control.  Continue insulin with meals, but can try lower mealtime coverage (15 units if less than 200, 20 if greater than 200).  This is if you are going to eat a regular meal. Still watch for low blood sugars and let me know if that occurs. Keep follow up with endocrinologist as planned.   No change in blood pressure medications for now, I expect the numbers to improve back on hydrochlorothiazide.  As we discussed ultimately I would like to change from beta-blockers to possibly other class but can discuss that further next visit.  Make sure you watch for any low blood sugar symptoms as the beta-blocker metoprolol can sometimes suppress those.   Continue same dose of cholesterol medication, I will check those labs.  Acid blocker was refilled, if symptoms are controlled with every other day dosing, that may be less likely to cause nutritional deficiencies.  Thank you for coming in today, recheck in 1 month.  Return to the clinic or go to the nearest emergency room if any of your symptoms worsen or new symptoms occur.   If you have lab work done today you will be contacted with your lab results within the next 2 weeks.  If you have not heard from [place] then please contact us. The fastest way to get your results is to register for My Chart.   IF you received an x-ray today, you will receive an invoice from [facility]. Please contact [facility] at [phone] with questions or concerns regarding your invoice.   IF you received labwork today, you will receive an invoice from [place]. Please contact [facility] at [phone] with questions or concerns regarding your invoice.   Our billing staff will not be able to assist you with questions  regarding bills from these companies.  You will be contacted with the lab results as soon as they are available. The fastest way to get your results is to activate your My Chart account. Instructions are located on the last page of this paperwork. If you have not heard from [place] regarding the results in 2 weeks, please contact this office.

## 2018-01-26 DIAGNOSIS — Z0271 Encounter for disability determination: Secondary | ICD-10-CM

## 2018-01-26 LAB — COMPREHENSIVE METABOLIC PANEL
ALBUMIN: 4 g/dL (ref 3.5–5.5)
ALK PHOS: 78 IU/L (ref 39–117)
ALT: 16 IU/L (ref 0–44)
AST: 15 IU/L (ref 0–40)
Albumin/Globulin Ratio: 1.3 (ref 1.2–2.2)
BUN / CREAT RATIO: 16 (ref 9–20)
BUN: 12 mg/dL (ref 6–24)
Bilirubin Total: 0.4 mg/dL (ref 0.0–1.2)
CO2: 24 mmol/L (ref 20–29)
CREATININE: 0.76 mg/dL (ref 0.76–1.27)
Calcium: 9.2 mg/dL (ref 8.7–10.2)
Chloride: 100 mmol/L (ref 96–106)
GFR, EST AFRICAN AMERICAN: 118 mL/min/{1.73_m2} (ref >59)
GFR, EST NON AFRICAN AMERICAN: 102 mL/min/{1.73_m2} (ref >59)
GLOBULIN, TOTAL: 3 g/dL (ref 1.5–4.5)
Glucose: 223 mg/dL — ABNORMAL HIGH (ref 65–99)
Potassium: 4.4 mmol/L (ref 3.5–5.2)
SODIUM: 141 mmol/L (ref 134–144)
TOTAL PROTEIN: 7 g/dL (ref 6.0–8.5)

## 2018-01-26 LAB — LIPID PANEL
CHOL/HDL RATIO: 2.8 ratio (ref 0.0–5.0)
Cholesterol, Total: 150 mg/dL (ref 100–199)
HDL: 53 mg/dL (ref >39)
LDL Calculated: 82 mg/dL (ref 0–99)
Triglycerides: 76 mg/dL (ref 0–149)
VLDL CHOLESTEROL CAL: 15 mg/dL (ref 5–40)

## 2018-01-26 LAB — MAGNESIUM: Magnesium: 1.3 mg/dL — ABNORMAL LOW (ref 1.6–2.3)

## 2018-01-30 ENCOUNTER — Other Ambulatory Visit: Payer: Self-pay | Admitting: Family Medicine

## 2018-01-30 MED ORDER — MAGNESIUM CHLORIDE 64 MG PO TBEC: 1.0000 | DELAYED_RELEASE_TABLET | Freq: Every day | ORAL | 1 refills | Status: DC

## 2018-02-21 ENCOUNTER — Ambulatory Visit (INDEPENDENT_AMBULATORY_CARE_PROVIDER_SITE_OTHER): Payer: Managed Care, Other (non HMO) | Admitting: Internal Medicine

## 2018-02-21 ENCOUNTER — Encounter: Payer: Self-pay | Admitting: Internal Medicine

## 2018-02-21 VITALS — BP 140/90 | HR 92 | Ht 70.0 in | Wt 210.0 lb

## 2018-02-21 DIAGNOSIS — E1165 Type 2 diabetes mellitus with hyperglycemia: Secondary | ICD-10-CM

## 2018-02-21 DIAGNOSIS — E785 Hyperlipidemia, unspecified: Secondary | ICD-10-CM | POA: Diagnosis not present

## 2018-02-21 DIAGNOSIS — E119 Type 2 diabetes mellitus without complications: Secondary | ICD-10-CM | POA: Diagnosis not present

## 2018-02-21 DIAGNOSIS — IMO0002 Reserved for concepts with insufficient information to code with codable children: Secondary | ICD-10-CM

## 2018-02-21 DIAGNOSIS — E669 Obesity, unspecified: Secondary | ICD-10-CM | POA: Diagnosis not present

## 2018-02-21 DIAGNOSIS — E1151 Type 2 diabetes mellitus with diabetic peripheral angiopathy without gangrene: Secondary | ICD-10-CM | POA: Diagnosis not present

## 2018-02-21 DIAGNOSIS — Z794 Long term (current) use of insulin: Secondary | ICD-10-CM

## 2018-02-21 LAB — POCT GLYCOSYLATED HEMOGLOBIN (HGB A1C): HEMOGLOBIN A1C: 10.4 % — AB (ref 4.0–5.6)

## 2018-02-21 MED ORDER — INSULIN GLARGINE 100 UNIT/ML SOLOSTAR PEN: 56.0000 [IU] | PEN_INJECTOR | Freq: Every day | SUBCUTANEOUS | 3 refills | Status: DC

## 2018-02-21 MED ORDER — INSULIN REGULAR HUMAN 100 UNIT/ML IJ SOLN: 20.0000 [IU] | Freq: Three times a day (TID) | INTRAMUSCULAR | 5 refills | Status: DC

## 2018-02-21 NOTE — Patient Instructions (Addendum)
Please continue: - Metformin 1000 mg 2x a day  Please increase: - Lantus 56 units at dinnertime - R insulin - take 30 minutes before meals: - 20 units before a smaller meal - 24-26 units before a regular meal - 30 units before a larger meal  DO NOT MISS ANY DOSES!  No sweet tea! No juice!  Please return in 3-4 months with your sugar log.

## 2018-02-21 NOTE — Progress Notes (Signed)
Subjective:     Patient ID: Raymond Pugh, male   DOB: 26-Feb-1961, 57 y.o.   MRN: [id number]  Diabetes    Raymond Pugh is a 57 y.o. man,returning for f/u for management of DM2, dx 2008, uncontrolled, insulin-dependent, with complications (peripheral neuropathy). Last visit 10 months ago.  He is not compliant with appointments and insulin doses (misses these frequently).  Since last visit, he was admitted with hypokalemia, AKI 11/2017.  He had several deaths in his family in last 2 mo.  Last hemoglobin A1c: Lab Results  Component Value Date   HGBA1C 10.1 03/25/2017   HGBA1C 9.2 12/22/2016   HGBA1C 10.1 07/20/2016   He is now on: - Metformin 1000 mg 2x a day  - Lantus 56 >> 20-26 units with dinner - PCP recently reduced his Lantus due to hypoglycemia episodes at night - R insulin after meals - 20 units before a smaller meal >> 16  - 24-26 units before a regular meal >> 20 - 28 units before a larger meal >> 20-30 Previously on Glipizide. He has hypoglycemia awareness at <90.  He checks his sugars 2 times a day: - a.m.: 119-330 >> 116-174, 200-378 >> 305-319 - 2h after b'fast:  181-273 >> 170-272 >> 198-465 - before lunch: 109, 173-374, 481 >> see above >> 231-471 - 2h after lunch:   157-321 >> 71-254, 368 >> 124, 276-360 - before dinner:  106, 234-311 >> 180-297 >> 149, 315 - after dinner:84, 117, 183-327, 427 >> 192-441, 598 >> 161-405 - bedtime: 150-170 >> 279, 350 >> see above >> 303-429 Highest: 500s (after a camping trip) >> 481 >> 598 >> n/c Lowest: 60 x1 >> 84 >> 71 (mid-day) >> 124 He had sugars of 600s in 12/2015 as he felt low and started to drink juice without checking his sugars!   -No CKD. Last BUN/Cr: Lab Results  Component Value Date   BUN 12 01/25/2018   Lab Results  Component Value Date   CREATININE 0.76 01/25/2018  On ramipril. -+ HL; last lipids: Lab Results  Component Value Date   CHOL 150 01/25/2018   HDL 53 01/25/2018   LDLCALC 82 01/25/2018    TRIG 76 01/25/2018   CHOLHDL 2.8 01/25/2018  On lovastatin - Last eye exam: 01/2017: No DR reportedly.  [facility]. he had retinal detachment surgery.  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 He has gait aches almost every weekend.  Review of Systems Constitutional: no weight gain/no weight loss, no fatigue, no subjective hyperthermia, no subjective hypothermia Eyes: no blurry vision, no xerophthalmia ENT: no sore throat, no nodules palpated in throat, no dysphagia, no odynophagia, no hoarseness Cardiovascular: no CP/no SOB/no palpitations/no leg swelling Respiratory: no cough/no SOB/no wheezing Gastrointestinal: no N/no V/no D/no C/no acid reflux Musculoskeletal: no muscle aches/no joint aches Skin: no rashes, no hair loss Neurological: no tremors/no numbness/no tingling/no dizziness  I reviewed pt's medications, allergies, PMH, social hx, family hx, and changes were documented in the history of present illness. Otherwise, unchanged from my initial visit note.   Objective:   Physical Exam BP 140/90   Pulse 92   Ht 5\' 10"  (1.778 m)   Wt 210 lb (95.3 kg)   SpO2 96%   BMI 30.13 kg/m  Body mass index is 30.13 kg/m. Wt Readings from Last 3 Encounters:  02/21/18 210 lb (95.3 kg)  01/25/18 221 lb 12.8 oz (100.6 kg)  12/29/17 213 lb (96.6 kg)  Constitutional: overweight, in NAD Eyes: PERRLA, EOMI, no exophthalmos ENT: moist mucous membranes, no thyromegaly, no cervical lymphadenopathy Cardiovascular: Tachycardia, RR, No MRG Respiratory: CTA B Gastrointestinal: abdomen soft, NT, ND, BS+ Musculoskeletal: no deformities, strength intact in all 4 Skin: moist, warm, no rashes Neurological: no tremor with outstretched hands, DTR normal in all 4  Assessment:     1.  DM2, uncontrolled, insulin-dependent, with complications - peripheral neuropathy - stable  2. HL  3. Obesity  Plan:     1. Pt with long-standing, uncontrolled diabetes, with  noncompliance with his visits and also his insulin doses.  As of now, he is taking lower doses of Lantus but this has been decreased by PCP due to to low blood sugar episodes at night.  However, after this change, his sugars are in the 300s in the morning and occasionally in the 400s afterwards.  He continues to drink sweet tea, which I strongly advised him to stop.  He is taking his regular insulin after meals and I again advised him to move these after meals.  He is trying not to miss insulin doses, but still does miss some. -I will also increase his regular insulin doses, which were decreased when his sugars improved after his hospitalization. -We again discussed about switching to a healthier diet.  He admits for eating more comfort food and snacks recently. - I advised him to: Patient Instructions  Please continue: - Metformin 1000 mg 2x a day  Please increase: - Lantus 56 units at dinnertime - R insulin - take 30 minutes before meals: - 20 units before a smaller meal - 24-26 units before a regular meal - 30 units before a larger meal  DO NOT MISS ANY DOSES!  No sweet tea! No juice!  Please return in 3-4 months with your sugar log.    - today, HbA1c is 10.4% (higher) - continue checking sugars at different times of the day - check 3x a day, rotating checks - advised for yearly eye exams >> he is not UTD - Refuses a flu shot today - Return to clinic in 3-4 mo with sugar log    2. HL - Reviewed latest lipid panel from last month: All fractions at goal Lab Results  Component Value Date   CHOL 150 01/25/2018   HDL 53 01/25/2018   LDLCALC 82 01/25/2018   TRIG 76 01/25/2018   CHOLHDL 2.8 01/25/2018  - Continues lovastatin without side effects.  3.  Obesity -He lost almost 20 pounds since last visit, but I am not sure if this is not because of his uncontrolled diabetes  [name], MD PhD [facility]

## 2018-02-21 NOTE — Addendum Note (Signed)
Addended by: Cardell Peach I on: 02/21/2018 02:18 PM   Modules accepted: Orders

## 2018-02-28 ENCOUNTER — Ambulatory Visit: Payer: Managed Care, Other (non HMO) | Admitting: Family Medicine

## 2018-03-27 ENCOUNTER — Other Ambulatory Visit: Payer: Self-pay | Admitting: Internal Medicine

## 2018-03-27 DIAGNOSIS — E119 Type 2 diabetes mellitus without complications: Secondary | ICD-10-CM

## 2018-03-27 DIAGNOSIS — Z794 Long term (current) use of insulin: Principal | ICD-10-CM

## 2018-04-14 ENCOUNTER — Ambulatory Visit (INDEPENDENT_AMBULATORY_CARE_PROVIDER_SITE_OTHER): Payer: Managed Care, Other (non HMO) | Admitting: Internal Medicine

## 2018-04-14 ENCOUNTER — Encounter: Payer: Self-pay | Admitting: Internal Medicine

## 2018-04-14 VITALS — BP 138/90 | HR 77 | Ht 70.0 in | Wt 228.0 lb

## 2018-04-14 DIAGNOSIS — E1151 Type 2 diabetes mellitus with diabetic peripheral angiopathy without gangrene: Secondary | ICD-10-CM

## 2018-04-14 DIAGNOSIS — E785 Hyperlipidemia, unspecified: Secondary | ICD-10-CM

## 2018-04-14 DIAGNOSIS — E669 Obesity, unspecified: Secondary | ICD-10-CM

## 2018-04-14 DIAGNOSIS — E1165 Type 2 diabetes mellitus with hyperglycemia: Secondary | ICD-10-CM | POA: Diagnosis not present

## 2018-04-14 DIAGNOSIS — IMO0002 Reserved for concepts with insufficient information to code with codable children: Secondary | ICD-10-CM

## 2018-04-14 MED ORDER — SEMAGLUTIDE(0.25 OR 0.5MG/DOS) 2 MG/1.5ML ~~LOC~~ SOPN: 0.5000 mg | PEN_INJECTOR | SUBCUTANEOUS | 5 refills | Status: DC

## 2018-04-14 NOTE — Patient Instructions (Addendum)
Please continue: - Metformin 1000 mg 2x a day - Lantus 56 units at dinnertime - R insulin - take 30 minutes before meals: - 20 units before a smaller meal - 24-26 units before a regular meal - 30 units before a larger meal  Try to start: Ozempic 0.25 mg weekly in a.m. (for example on Sunday morning) x 4 weeks, then increase to 0.5 mg weekly in a.m. if no nausea or hypoglycemia.  If you do start Ozempic, then decrease: - Metformin 1000 mg 2x a day - Lantus 50 units at dinnertime - R insulin - take 30 minutes before meals: - 15 units before a smaller meal - 20 units before a regular meal - 25 units before a larger meal  Please return in 3 months with your sugar log.

## 2018-04-14 NOTE — Progress Notes (Signed)
Subjective:     Patient ID: Raymond Pugh, male   DOB: [birth year], 57 y.o.   MRN: [id number]  Diabetes   Raymond Pugh is a 57 y.o. man,returning for f/u for management of DM2, dx 2008, uncontrolled, insulin-dependent, with complications (peripheral neuropathy). Last visit 1.5 mo ago.  Raymond Pugh is usually not compliant with appointments and insulin doses (misses these frequently).  Last hemoglobin A1c: Lab Results  Component Value Date   HGBA1C 10.4 (A) 02/21/2018   HGBA1C 10.1 03/25/2017   HGBA1C 9.2 12/22/2016   Raymond Pugh is now on: - Metformin 1000 mg 2x a day - Lantus 56-60 units at dinnertime - R insulin - take 30 minutes before meals: - 20 units before a smaller meal - 24-26 units before a regular meal - 30 units before a larger meal Previously on Glipizide. Raymond Pugh has hypoglycemia awareness at <90.  Raymond Pugh checks his sugars 2-3x a day -per meter download- improved in the last 2 weeks: - a.m.: 119-330 >> 116-174, 200-378 >> 305-319 >> 93, 125-270 - 2h after b'fast:  181-273 >> 170-272 >> 198-465 >> 264, 270 - before lunch: 109, 173-374, 481 >> see above >> 231-471 >> 217-315 - 2h after lunch:   157-321 >> 71-254, 368 >> 124, 276-360 >> 315, 323 - before dinne/r:  106, 234-311 >> 180-297 >> 149, 315 >> 109, 124, 298, 349 - after dinner:84, 117, 183-327, 427 >> 192-441, 598 >> 161-405 >> 94, 134-466 - bedtime: 150-170 >> 279, 350 >> see above >> 303-429 >> 187-366, 381 Highest: 500s (after a camping trip) >> 481 >> 598 >> n/c Lowest: 60 x1 >> 84 >> 71 (mid-day) >> 124 Raymond Pugh had sugars of 600s in 12/2015 as Raymond Pugh felt low and started to drink juice without checking his sugars!  Lowest: 93  - No CKD. Last BUN/Cr: Lab Results  Component Value Date   BUN 12 01/25/2018   Lab Results  Component Value Date   CREATININE 0.76 01/25/2018  On ramipril. -+ HL; last lipids: Lab Results  Component Value Date   CHOL 150 01/25/2018   HDL 53 01/25/2018   LDLCALC 82 01/25/2018   TRIG 76 01/25/2018    CHOLHDL 2.8 01/25/2018  On Lovastatin - Last eye exam: 01/2017: reportedly  No DR.  [facility]. He had retinal detachment surgery.  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 Raymond Pugh has gait aches almost every weekend.  Review of Systems Constitutional: no weight gain/no weight loss, no fatigue, no subjective hyperthermia, no subjective hypothermia Eyes: no blurry vision, no xerophthalmia ENT: no sore throat, no nodules palpated in neck, no dysphagia, no odynophagia, no hoarseness Cardiovascular: no CP/no SOB/no palpitations/no leg swelling Respiratory: no cough/no SOB/no wheezing Gastrointestinal: no N/no V/no D/no C/no acid reflux Musculoskeletal: no muscle aches/no joint aches Skin: no rashes, no hair loss Neurological: no tremors/no numbness/no tingling/no dizziness  I reviewed pt's medications, allergies, PMH, social hx, family hx, and changes were documented in the history of present illness. Otherwise, unchanged from my initial visit note.  Past Medical History:  Diagnosis Date  . ADHD (attention deficit hyperactivity disorder)   . Anemia   . Barrett's esophagus   . Diabetes mellitus   . Hiatal hernia   . Hyperlipemia   . Hypertension   . Myocarditis ([place])    h/o mypocarditis, caused cardiomyopathy-- resolved    Past Surgical History:  Procedure Laterality Date  . ABSCESS DRAIN LIVER PERC ([place] [facility])  klebsiella on liver pa states   . EYE SURGERY    . HYDROCELE EXCISION / REPAIR    . radiokeratotomy    . RETINAL DETACHMENT SURGERY     Social History   Socioeconomic History  . Marital status: Married    Spouse name: Not on file  . Number of children: 2  . Years of education: Not on file  . Highest education level: Not on file  Occupational History  . Occupation: Ship broker, part time job    Comment: Market researcher, Architect  Social Needs  . Financial resource strain: Not on file  . Food insecurity:    Worry: Not on file    Inability: Not on  file  . Transportation needs:    Medical: Not on file    Non-medical: Not on file  Tobacco Use  . Smoking status: Never Smoker  . Smokeless tobacco: Never Used  Substance and Sexual Activity  . Alcohol use: Yes    Alcohol/week: 1.0 standard drinks    Types: 1 Cans of beer per week    Comment: socially   . Drug use: No  . Sexual activity: Not on file    Comment: not asked  Lifestyle  . Physical activity:    Days per week: Not on file    Minutes per session: Not on file  . Stress: Not on file  Relationships  . Social connections:    Talks on phone: Not on file    Gets together: Not on file    Attends religious service: Not on file    Active member of club or organization: Not on file    Attends meetings of clubs or organizations: Not on file    Relationship status: Not on file  . Intimate partner violence:    Fear of current or ex partner: Not on file    Emotionally abused: Not on file    Physically abused: Not on file    Forced sexual activity: Not on file  Other Topics Concern  . Not on file  Social History Narrative   2 children + 2 step children ---    Has a part time job, student    Exercise swimming   Current Outpatient Medications on File Prior to Visit  Medication Sig Dispense Refill  . ACCU-CHEK GUIDE test strip TEST FOUR TIMES DAILY AS DIRECTED 400 each 0  . ALPRAZolam (XANAX) 0.5 MG tablet Take 1 tablet (0.5 mg total) by mouth 2 (two) times daily as needed for anxiety. 20 tablet 0  . amphetamine-dextroamphetamine (ADDERALL XR) 20 MG 24 hr capsule Take 1 Capsules by mouth every day prn 30 capsule 0  . BD PEN NEEDLE NANO U/F 32G X 4 MM MISC Use 4 times daily 400 each 3  . hydrochlorothiazide (HYDRODIURIL) 25 MG tablet TAKE 1 TABLET(25 MG) BY MOUTH DAILY 90 tablet 1  . Insulin Glargine (LANTUS SOLOSTAR) 100 UNIT/ML Solostar Pen Inject 56 Units into the skin daily at 10 pm. 45 mL 3  . insulin regular (NOVOLIN R,HUMULIN R) 100 units/mL injection Inject 0.2-0.28 mLs  (20-28 Units total) into the skin 3 (three) times daily before meals. 20 mL 5  . Lancet Devices (ACCU-CHEK SOFTCLIX) lancets Use as instructed 1 each 0  . lovastatin (MEVACOR) 20 MG tablet TAKE 1 TABLET AT BEDTIME 90 tablet 2  . magnesium chloride (SLOW-MAG) 64 MG TBEC SR tablet Take 1 tablet (64 mg total) by mouth daily. 30 tablet 1  . metFORMIN (GLUCOPHAGE) 1000 MG tablet TAKE  1 TABLET TWICE A DAY 180 tablet 3  . metoprolol tartrate (LOPRESSOR) 100 MG tablet TAKE 1 TABLET(100 MG) BY MOUTH TWICE DAILY 180 tablet 1  . ONETOUCH DELICA LANCETS FINE MISC Use to test blood sugar 4-6 times daily as instructed. Dx: E11.51 200 each 3  . pantoprazole (PROTONIX) 40 MG tablet TAKE 1 TABLET BY MOUTH EVERY MORNING BEFORE BREAKFAST 90 tablet 0  . ramipril (ALTACE) 10 MG capsule Take 1 capsule (10 mg total) by mouth 2 (two) times daily. 180 capsule 1  . RELION INSULIN SYRINGE 31G X 15/64" 0.5 ML MISC INJECT  THREE TIMES DAILY 100 each 11   No current facility-administered medications on file prior to visit.    No Known Allergies Family History  Problem Relation Age of Onset  . Diabetes Father   . Hypertension Father   . Colon polyps Father   . Hypertension Mother   . Asthma Mother   . Prostate cancer Neg Hx      Objective:   Physical Exam BP 138/90   Pulse 77   Ht 5\' 10"  (1.778 m)   Wt 228 lb (103.4 kg)   SpO2 97%   BMI 32.71 kg/m  Body mass index is 32.71 kg/m. Wt Readings from Last 3 Encounters:  04/14/18 228 lb (103.4 kg)  02/21/18 210 lb (95.3 kg)  01/25/18 221 lb 12.8 oz (100.6 kg)   Constitutional: overweight, in NAD Eyes: PERRLA, EOMI, no exophthalmos ENT: moist mucous membranes, no thyromegaly, no cervical lymphadenopathy Cardiovascular: RRR, No MRG Respiratory: CTA B Gastrointestinal: abdomen soft, NT, ND, BS+ Musculoskeletal: no deformities, strength intact in all 4 Skin: moist, warm, no rashes Neurological: no tremor with outstretched hands, DTR normal in all  4   Assessment:     1.  DM2, uncontrolled, insulin-dependent, with complications - peripheral neuropathy - stable  2. HL  3. Obesity  Plan:     1. Pt with long-standing, uncontrolled, diabetes, with noncompliance with visits and insulin doses in the past, but now returning after 1.5 months with better sugars after Raymond Pugh started to take his insulin more regularly.  At last visit, we increased the doses of his insulin and we also discussed about improvement in his diet: Cutting out sweet drinks.  Raymond Pugh is still having dietary indiscretions, after which sugars increased to 300s or 400s, but these are much more rare in the last 2 weeks. - At this visit, reviewing his meter downloads, Raymond Pugh still has a lot of variability in his blood sugars and we are limited in how much we can increase his insulin doses due to his history of nocturnal hypoglycemia.  At this visit, I suggested to start the GLP-1 receptor agonist.  We discussed about benefits and possible side effects.  We will try to start Ozempic at the low dose and increase as tolerated.  I gave Raymond Pugh an alternate regimen of Lantus and regular insulin if Raymond Pugh can start Ozempic, with lower doses. - latest HbA1c was higher, at 10.4% - I advised Raymond Pugh to: Patient Instructions  Please continue: - Metformin 1000 mg 2x a day - Lantus 56 units at dinnertime - R insulin - take 30 minutes before meals: - 20 units before a smaller meal - 24-26 units before a regular meal - 30 units before a larger meal  Try to start: Ozempic 0.25 mg weekly in a.m. (for example on Sunday morning) x 4 weeks, then increase to 0.5 mg weekly in a.m. if no nausea or hypoglycemia.  If you  do start Ozempic, then decrease: - Metformin 1000 mg 2x a day - Lantus 50 units at dinnertime - R insulin - take 30 minutes before meals: - 15 units before a smaller meal - 20 units before a regular meal - 25 units before a larger meal  Please return in 3 months with your sugar log.   -  continue checking sugars at different times of the day - check 3x a day, rotating checks - advised for yearly eye exams >> Raymond Pugh is not UTD but has one scheduled - refuses flu shot - Return to clinic in 3-4 mo with sugar log     2. HL - Reviewed latest lipid panel from 01/2018: all fxns at goal Lab Results  Component Value Date   CHOL 150 01/25/2018   HDL 53 01/25/2018   LDLCALC 82 01/25/2018   TRIG 76 01/25/2018   CHOLHDL 2.8 01/25/2018  - Continues lovastatin without side effects.   3.  Obesity - gained weight since last visit: 17 lbs! -Raymond Pugh had lost almost 20 pounds before last visit  [name], MD PhD [facility]

## 2018-04-24 ENCOUNTER — Other Ambulatory Visit: Payer: Self-pay | Admitting: Family Medicine

## 2018-04-24 DIAGNOSIS — K219 Gastro-esophageal reflux disease without esophagitis: Secondary | ICD-10-CM

## 2018-05-12 ENCOUNTER — Other Ambulatory Visit: Payer: Self-pay | Admitting: Internal Medicine

## 2018-05-12 DIAGNOSIS — Z794 Long term (current) use of insulin: Principal | ICD-10-CM

## 2018-05-12 DIAGNOSIS — E119 Type 2 diabetes mellitus without complications: Secondary | ICD-10-CM

## 2018-05-12 NOTE — Telephone Encounter (Signed)
Patient called re: wants to expedite RX for MEDICATION: Lantus, Accuchek & Gurde Test Strips, AND HCTZ (Also please see addition RX request under OTHER COMMENTS)  PHARMACY:  [facility] on [address] : Yes  IS PATIENT OUT OF MEDICATION: Yes  IF NOT; HOW MUCH IS LEFT: None  LAST APPOINTMENT DATE: @12 /20/2019  NEXT APPOINTMENT DATE:@3 /02/2019  DO WE HAVE YOUR PERMISSION TO LEAVE A DETAILED MESSAGE:Yes  OTHER COMMENTS:   RX for Novalin R-send to [facility] on [address]   **Let patient know to contact pharmacy at the end of the day to make sure medication is ready. **  ** Please notify patient to allow 48-72 hours to process**  **Encourage patient to contact the pharmacy for refills or they can request refills through [facility]**

## 2018-05-29 ENCOUNTER — Telehealth: Payer: Self-pay | Admitting: Internal Medicine

## 2018-05-29 MED ORDER — GLUCOSE BLOOD VI STRP: ORAL_STRIP | 2 refills | Status: DC

## 2018-05-29 NOTE — Telephone Encounter (Signed)
Per [facility] "Caller states hi needing a refill on his test strips, accu-check guide. Wants to know what they should be doing. Pharmacy: [place]"

## 2018-05-29 NOTE — Telephone Encounter (Signed)
RX sent

## 2018-06-24 ENCOUNTER — Other Ambulatory Visit: Payer: Self-pay | Admitting: Internal Medicine

## 2018-06-24 DIAGNOSIS — E785 Hyperlipidemia, unspecified: Secondary | ICD-10-CM

## 2018-07-03 ENCOUNTER — Other Ambulatory Visit: Payer: Self-pay | Admitting: Internal Medicine

## 2018-08-01 ENCOUNTER — Ambulatory Visit: Payer: Managed Care, Other (non HMO) | Admitting: Internal Medicine

## 2018-08-01 ENCOUNTER — Telehealth: Payer: Self-pay | Admitting: Internal Medicine

## 2018-08-01 NOTE — Telephone Encounter (Signed)
Patient no showed today's appt. Please advise on how to follow up. °A. No follow up necessary. °B. Follow up urgent. Contact patient immediately. °C. Follow up necessary. Contact patient and schedule visit in ___ days. °D. Follow up advised. Contact patient and schedule visit in ____weeks. ° °Would you like the NS fee to be applied to this visit? ° °

## 2018-08-01 NOTE — Telephone Encounter (Signed)
4 months

## 2018-08-21 ENCOUNTER — Other Ambulatory Visit: Payer: Self-pay | Admitting: Family Medicine

## 2018-08-21 DIAGNOSIS — I10 Essential (primary) hypertension: Secondary | ICD-10-CM

## 2018-08-21 NOTE — Telephone Encounter (Signed)
Patient said that he doesn't want to come into an office now, because he is afraid he will catch something

## 2018-08-21 NOTE — Telephone Encounter (Signed)
Requested medication (s) are due for refill today:  yes  Requested medication (s) are on the active medication list:  yes  Future visit scheduled:  no  Last Refill: 01/25/18; #90; Refill x 1  Attempted to call pt. To schedule f/u appt. (was to f/u in 02/2018)  Left vm to call office to schedule f/u appt.   Requested Prescriptions  Pending Prescriptions Disp Refills   hydrochlorothiazide (HYDRODIURIL) 25 MG tablet [Pharmacy Med Name: HYDROCHLOROTHIAZIDE 25MG  TABLETS] 90 tablet 1    Sig: TAKE 1 TABLET(25 MG) BY MOUTH DAILY     Cardiovascular: Diuretics - Thiazide Failed - 08/21/2018  8:19 AM      Failed - Last BP in normal range    BP Readings from Last 1 Encounters:  04/14/18 138/90         Failed - Valid encounter within last 6 months    Recent Outpatient Visits          6 months ago Type 2 diabetes mellitus with hyperglycemia, with long-term current use of insulin ([place])   [facility], Ranell Patrick, MD   7 months ago AKI (acute kidney injury) [facility])   [facility], Ranell Patrick, MD   1 year ago Hyperlipidemia, unspecified hyperlipidemia type   [facility], Ranell Patrick, MD   2 years ago Type 2 diabetes mellitus with diabetic polyneuropathy, with long-term current use of insulin [facility])   [facility], Zoe A, MD   2 years ago Perirectal abscess   [facility], Ranell Patrick, MD             Passed - Ca in normal range and within 360 days    Calcium  Date Value Ref Range Status  01/25/2018 9.2 8.7 - 10.2 mg/dL Final         Passed - Cr in normal range and within 360 days    Creat  Date Value Ref Range Status  04/08/2016 0.66 (L) 0.70 - 1.33 mg/dL Final    Comment:      For patients > or = 58 years of age: The upper reference limit for Creatinine is approximately 13% higher for people identified as African-American.      Creatinine, Ser  Date Value Ref Range Status  01/25/2018 0.76 0.76 -  1.27 mg/dL Final         Passed - K in normal range and within 360 days    Potassium  Date Value Ref Range Status  01/25/2018 4.4 3.5 - 5.2 mmol/L Final         Passed - Na in normal range and within 360 days    Sodium  Date Value Ref Range Status  01/25/2018 141 134 - 144 mmol/L Final

## 2018-08-27 ENCOUNTER — Other Ambulatory Visit: Payer: Self-pay | Admitting: Family Medicine

## 2018-08-27 DIAGNOSIS — K219 Gastro-esophageal reflux disease without esophagitis: Secondary | ICD-10-CM

## 2018-10-04 ENCOUNTER — Telehealth: Payer: Self-pay | Admitting: Internal Medicine

## 2018-10-04 NOTE — Telephone Encounter (Signed)
Per [facility] refill requested - note in box

## 2018-10-09 ENCOUNTER — Encounter: Payer: Self-pay | Admitting: Family Medicine

## 2018-10-09 ENCOUNTER — Telehealth (INDEPENDENT_AMBULATORY_CARE_PROVIDER_SITE_OTHER): Payer: Managed Care, Other (non HMO) | Admitting: Family Medicine

## 2018-10-09 ENCOUNTER — Other Ambulatory Visit: Payer: Self-pay

## 2018-10-09 DIAGNOSIS — E1165 Type 2 diabetes mellitus with hyperglycemia: Secondary | ICD-10-CM

## 2018-10-09 DIAGNOSIS — F418 Other specified anxiety disorders: Secondary | ICD-10-CM

## 2018-10-09 DIAGNOSIS — E785 Hyperlipidemia, unspecified: Secondary | ICD-10-CM

## 2018-10-09 DIAGNOSIS — K219 Gastro-esophageal reflux disease without esophagitis: Secondary | ICD-10-CM

## 2018-10-09 DIAGNOSIS — I10 Essential (primary) hypertension: Secondary | ICD-10-CM

## 2018-10-09 MED ORDER — SEMAGLUTIDE(0.25 OR 0.5MG/DOS) 2 MG/1.5ML ~~LOC~~ SOPN: 0.5000 mg | PEN_INJECTOR | SUBCUTANEOUS | 0 refills | Status: DC

## 2018-10-09 MED ORDER — LOVASTATIN 20 MG PO TABS: 20.0000 mg | ORAL_TABLET | Freq: Every day | ORAL | 1 refills | Status: DC

## 2018-10-09 MED ORDER — RAMIPRIL 10 MG PO CAPS: 10.0000 mg | ORAL_CAPSULE | Freq: Two times a day (BID) | ORAL | 1 refills | Status: DC

## 2018-10-09 MED ORDER — HYDROCHLOROTHIAZIDE 25 MG PO TABS: ORAL_TABLET | ORAL | 1 refills | Status: DC

## 2018-10-09 MED ORDER — METOPROLOL TARTRATE 100 MG PO TABS: ORAL_TABLET | ORAL | 1 refills | Status: DC

## 2018-10-09 MED ORDER — ALPRAZOLAM 0.5 MG PO TABS: 0.5000 mg | ORAL_TABLET | Freq: Two times a day (BID) | ORAL | 0 refills | Status: DC | PRN

## 2018-10-09 MED ORDER — PANTOPRAZOLE SODIUM 40 MG PO TBEC: DELAYED_RELEASE_TABLET | ORAL | 1 refills | Status: DC

## 2018-10-09 NOTE — Patient Instructions (Addendum)
It is very important that you call your diabetes doctor to schedule appointment as soon as possible.  I did refill as that they can temporarily until you can be seen.  I also checked an A1c.  Cholesterol medication refilled at same dose, but will need to check lab work with fasting labs this week.  Also will need to check magnesium and other electrolytes to make sure other medications are still safe to prescribe.  I did refill the alprazolam if needed but if that is more frequent we needed, will need to discuss other treatment.  I do not feel safe prescribing Adderall at this time until diabetes control can be determined.  Please follow-up with me in 6 weeks.  Thank you for your time today.    If you have lab work done today you will be contacted with your lab results within the next 2 weeks.  If you have not heard from [place] then please contact us. The fastest way to get your results is to register for My Chart.   IF you received an x-ray today, you will receive an invoice from [facility]. Please contact [facility] at [phone] with questions or concerns regarding your invoice.   IF you received labwork today, you will receive an invoice from [place]. Please contact [facility] at [phone] with questions or concerns regarding your invoice.   Our billing staff will not be able to assist you with questions regarding bills from these companies.  You will be contacted with the lab results as soon as they are available. The fastest way to get your results is to activate your My Chart account. Instructions are located on the last page of this paperwork. If you have not heard from [place] regarding the results in 2 weeks, please contact this office.

## 2018-10-09 NOTE — Progress Notes (Signed)
10 min chart review.   Virtual Visit via Telephone Note  I connected with Raymond Pugh on 10/09/18 at  by telephone and verified that I am speaking with the correct person using two identifiers.   I discussed the limitations, risks, security and privacy concerns of performing an evaluation and management service by telephone and the availability of in person appointments. I also discussed with the patient that there may be a patient responsible charge related to this service. The patient expressed understanding and agreed to proceed, consent obtained  Chief complaint:  DM2, HTN, other med refills   History of Present Illness: Raymond Pugh is a 58 y.o. male History of multiple medical problems, including uncontrolled diabetes with prior medication nonadherence.  I last saw him in September 2019.  Diabetes: Followed by endocrinology, Dr.Gherghe, but patient was no-show for March 10 visit.  Message was sent to patient 5 days ago from endocrinology about scheduling virtual visit, but I do not see where he has called to schedule.  Diabetes complicated by intermittent dosing of insulin in the past, and reported to hypoglycemic episodes when discussed in September. Worried about Coronavirus at time of March visit. Needs ozempic and ramipril. Out of both for now.  Still on metformin, lantus - "sometimes at night"- only uses 1-2 times per week. "only if it is high".  Has lost weight with use of Ozempic.  No symptomatic lows. Highest blood sugar is 200, normally 160-170.  On ACE and ARB.   Lab Results  Component Value Date   HGBA1C 10.4 (A) 02/21/2018   HGBA1C 10.1 03/25/2017   HGBA1C 9.2 12/22/2016   Lab Results  Component Value Date   MICROALBUR 1.5 11/24/2015   LDLCALC 82 01/25/2018   CREATININE 0.76 01/25/2018   Hypomagnesemia: See prior notes, low of 1.3 in September 2019 with plan repeat testing after restarting supplement.  Did not return for that testing. Takes OTC  magnesium once every 2 days.   Request refill of Adderall.  This is been discussed in the past but refills of Adderall had been deferred given stimulant riskand uncontrolled diabetes.  Situational anxiety: Has used alprazolam 0.5 mg as needed previously. Controlled substance database (PDMP) reviewed. No concerns appreciated.  Last prescription for alprazolam No. 20 prescribed on June 07, 2017. Has taken rarely if more significant anxiety to call nerves.   GERD: As above did have low magnesium with plan to recheck levels last year. Pantoprazole daily. Magnesium every other day - no recent cramps.   Hypertension: BP Readings from Last 3 Encounters:  10/09/18 120/75  04/14/18 138/90  02/21/18 140/90   Lab Results  Component Value Date   CREATININE 0.76 01/25/2018  Has taken ramipril BID,  Metoprolol BID, hydrochlorothiazide QD.  Home readings: 120/75 last week. Walking with grandbaby around neighborhood. Changed diet some.  Respiratory: Negative for cough, chest tightness and shortness of breath.   Cardiovascular: Negative for chest pain, palpitations and leg swelling.  Gastrointestinal: Negative for abdominal pain and blood in stool.  Neurological: Negative for dizziness, light-headedness and headaches.    Hyperlipidemia:  Lab Results  Component Value Date   CHOL 150 01/25/2018   HDL 53 01/25/2018   LDLCALC 82 01/25/2018   TRIG 76 01/25/2018   CHOLHDL 2.8 01/25/2018   Lab Results  Component Value Date   ALT 16 01/25/2018   AST 15 01/25/2018   ALKPHOS 78 01/25/2018   BILITOT 0.4 01/25/2018   Lovastatin 20mg  qd.  Has had some cramps  at times in past - takes potassium once every 2 days.     Patient Active Problem List   Diagnosis Date Noted  . Liver abscess 06/24/2014  . Eyelid lesion 05/29/2012  . Obesity 08/25/2010  . BARRETTS ESOPHAGUS 07/03/2010  . Iron deficiency anemia, unspecified 11/16/2009  . Hyperlipidemia 11/04/2008  . ADHD 08/14/2007  . MOLE  03/13/2007  . Uncontrolled type 2 diabetes mellitus with peripheral angiopathy ([place]) 03/13/2007  . Essential hypertension 11/03/2006   Past Medical History:  Diagnosis Date  . ADHD (attention deficit hyperactivity disorder)   . Anemia   . Barrett's esophagus   . Diabetes mellitus   . Hiatal hernia   . Hyperlipemia   . Hypertension   . Myocarditis ([place])    h/o mypocarditis, caused cardiomyopathy-- resolved    Past Surgical History:  Procedure Laterality Date  . ABSCESS DRAIN LIVER PERC ([place] [facility])     klebsiella on liver pa states   . EYE SURGERY    . HYDROCELE EXCISION / REPAIR    . radiokeratotomy    . RETINAL DETACHMENT SURGERY     No Known Allergies Prior to Admission medications   Medication Sig Start Date End Date Taking? Authorizing Provider  ALPRAZolam Duanne Moron) 0.5 MG tablet Take 1 tablet (0.5 mg total) by mouth 2 (two) times daily as needed for anxiety. 06/07/17  Yes Wendie Agreste, MD  amphetamine-dextroamphetamine (ADDERALL XR) 20 MG 24 hr capsule Take 1 Capsules by mouth every day prn 11/24/15  Yes Daub, Loura Back, MD  BD PEN NEEDLE NANO U/F 32G X 4 MM MISC Use 4 times daily 11/24/15  Yes Daub, Loura Back, MD  glucose blood (ACCU-CHEK GUIDE) test strip TEST FOUR TIMES DAILY AS DIRECTED 05/29/18  Yes [name], MD  hydrochlorothiazide (HYDRODIURIL) 25 MG tablet TAKE 1 TABLET(25 MG) BY MOUTH DAILY 08/22/18  Yes Wendie Agreste, MD  Lancet Devices [facility]) lancets Use as instructed 05/25/17  Yes [name], MD  lovastatin (MEVACOR) 20 MG tablet TAKE 1 TABLET AT BEDTIME 06/26/18  Yes [name], MD  magnesium chloride (SLOW-MAG) 64 MG TBEC SR tablet Take 1 tablet (64 mg total) by mouth daily. 01/30/18  Yes Wendie Agreste, MD  metFORMIN (GLUCOPHAGE) 1000 MG tablet TAKE 1 TABLET TWICE A DAY 12/19/17  Yes [name], MD  metoprolol tartrate (LOPRESSOR) 100 MG tablet TAKE 1 TABLET(100 MG) BY MOUTH TWICE DAILY 01/25/18  Yes Wendie Agreste, MD   NOVOLIN R RELION 100 UNIT/ML injection INJECT 16 TO 20 UNITS SUBCUTANEOUSLY THREE TIMES DAILY BEFORE MEAL(S) 07/03/18  Yes [name], MD  [facility] DELICA LANCETS FINE MISC Use to test blood sugar 4-6 times daily as instructed. Dx: E11.51 11/24/15  Yes Darlyne Russian, MD  pantoprazole (PROTONIX) 40 MG tablet TAKE 1 TABLET BY MOUTH EVERY MORNING BEFORE BREAKFAST 04/24/18  Yes Wendie Agreste, MD  ramipril (ALTACE) 10 MG capsule Take 1 capsule (10 mg total) by mouth 2 (two) times daily. 01/25/18  Yes Wendie Agreste, MD  [place] X 15/64" 0.5 ML MISC INJECT  THREE TIMES DAILY 10/11/17  Yes [name], MD  Semaglutide,0.25 or 0.5MG /DOS, (OZEMPIC, 0.25 OR 0.5 MG/DOSE,) 2 MG/1.5ML SOPN Inject 0.5 mg into the skin once a week. 04/14/18  Yes [name], MD  LANTUS SOLOSTAR 100 UNIT/ML Solostar Pen ADMINISTER 52 UNITS UNDER THE SKIN DAILY AT 10 PM Patient not taking: Reported on 10/09/2018 05/12/18   [name], MD  LANTUS SOLOSTAR 100 UNIT/ML Solostar  Pen ADMINISTER 52 UNITS UNDER THE SKIN DAILY AT 10 PM Patient not taking: Reported on 10/09/2018 05/12/18   [name], MD   Social History   Socioeconomic History  . Marital status: Married    Spouse name: Not on file  . Number of children: 2  . Years of education: Not on file  . Highest education level: Not on file  Occupational History  . Occupation: Ship broker, part time job    Comment: Market researcher, Architect  Social Needs  . Financial resource strain: Not on file  . Food insecurity:    Worry: Not on file    Inability: Not on file  . Transportation needs:    Medical: Not on file    Non-medical: Not on file  Tobacco Use  . Smoking status: Never Smoker  . Smokeless tobacco: Never Used  Substance and Sexual Activity  . Alcohol use: Yes    Alcohol/week: 1.0 standard drinks    Types: 1 Cans of beer per week    Comment: socially   . Drug use: No  . Sexual activity: Not on file    Comment: not  asked  Lifestyle  . Physical activity:    Days per week: Not on file    Minutes per session: Not on file  . Stress: Not on file  Relationships  . Social connections:    Talks on phone: Not on file    Gets together: Not on file    Attends religious service: Not on file    Active member of club or organization: Not on file    Attends meetings of clubs or organizations: Not on file    Relationship status: Not on file  . Intimate partner violence:    Fear of current or ex partner: Not on file    Emotionally abused: Not on file    Physically abused: Not on file    Forced sexual activity: Not on file  Other Topics Concern  . Not on file  Social History Narrative   2 children + 2 step children ---    Has a part time job, student    Exercise swimming     Observations/Objective: No distress on phone, appropriate responses.  Assessment and Plan: Hypomagnesemia - Plan: Magnesium  -Recheck level, continue supplement, may need close monitoring with use of PPI.  Situational anxiety - Plan: ALPRAZolam (XANAX) 0.5 MG tablet  -Infrequent use of alprazolam, agreed on refill for now.   -Discussed ADD but concerned about stimulant use with uncontrolled diabetes.  Held on refilling medication for now, follow-up to discuss once mind back up with endocrinologist, and could consider non-stimulant options.  Gastroesophageal reflux disease, esophagitis presence not specified - Plan: pantoprazole (PROTONIX) 40 MG tablet  -Stable with use of PPI.  Continue same, monitor magnesium as above  Essential hypertension - Plan: ramipril (ALTACE) 10 MG capsule, metoprolol tartrate (LOPRESSOR) 100 MG tablet, hydrochlorothiazide (HYDRODIURIL) 25 MG tablet, Comprehensive metabolic panel  -Stable by home readings, continue same regimen.  Labs pending  Hyperlipidemia, unspecified hyperlipidemia type - Plan: lovastatin (MEVACOR) 20 MG tablet, Lipid panel, Comprehensive metabolic panel  -Tolerating lovastatin  currently, check labs.  If recurrent myalgias/cramps, check CPK, and can discuss possible trial off statin temporarily.  Type 2 diabetes mellitus with hyperglycemia, with long-term current use of insulin (HCC) - Plan: Semaglutide,0.25 or 0.5MG /DOS, (OZEMPIC, 0.25 OR 0.5 MG/DOSE,) 2 MG/1.5ML SOPN, Hemoglobin A1c  -Uncontrolled with variable adherence to insulin regimen.  Will check A1c, but stressed importance of ongoing  follow-up with his endocrinologist, and with medication compliance/discussion of medication regimen to lessen risk of hypoglycemia, but also for improved control.  Risks of uncontrolled diabetes were discussed  Follow Up Instructions:  6 weeks.  Patient Instructions   It is very important that you call your diabetes doctor to schedule appointment as soon as possible.  I did refill as that they can temporarily until you can be seen.  I also checked an A1c.  Cholesterol medication refilled at same dose, but will need to check lab work with fasting labs this week.  Also will need to check magnesium and other electrolytes to make sure other medications are still safe to prescribe.  I did refill the alprazolam if needed but if that is more frequent we needed, will need to discuss other treatment.  I do not feel safe prescribing Adderall at this time until diabetes control can be determined.  Please follow-up with me in 6 weeks.  Thank you for your time today.    If you have lab work done today you will be contacted with your lab results within the next 2 weeks.  If you have not heard from [place] then please contact us. The fastest way to get your results is to register for My Chart.   IF you received an x-ray today, you will receive an invoice from [facility]. Please contact [facility] at [phone] with questions or concerns regarding your invoice.   IF you received labwork today, you will receive an invoice from [facility]. Please contact [facility] at  [phone] with questions or concerns regarding your invoice.   Our billing staff will not be able to assist you with questions regarding bills from these companies.  You will be contacted with the lab results as soon as they are available. The fastest way to get your results is to activate your My Chart account. Instructions are located on the last page of this paperwork. If you have not heard from [place] regarding the results in 2 weeks, please contact this office.         I discussed the assessment and treatment plan with the patient. The patient was provided an opportunity to ask questions and all were answered. The patient agreed with the plan and demonstrated an understanding of the instructions.   The patient was advised to call back or seek an in-person evaluation if the symptoms worsen or if the condition fails to improve as anticipated.  I provided 24 minutes of non-face-to-face time during this encounter.  Signed,   [name], MD [facility] [place].  10/09/18

## 2018-10-09 NOTE — Progress Notes (Signed)
CC: medication refills, pt needs xanax, adderall, pen needles, test strips, hctz, metoprolol, pantoprazole and ramipril.  No other concerns.  Recent bp and weight noted in vitals-per pt.  No travel outside the [place] or [place] in the past 3 weeks.

## 2018-10-12 ENCOUNTER — Ambulatory Visit: Payer: Managed Care, Other (non HMO)

## 2018-11-02 ENCOUNTER — Ambulatory Visit: Payer: Managed Care, Other (non HMO)

## 2018-11-04 ENCOUNTER — Other Ambulatory Visit: Payer: Self-pay | Admitting: Family Medicine

## 2018-11-04 DIAGNOSIS — I10 Essential (primary) hypertension: Secondary | ICD-10-CM

## 2018-11-06 NOTE — Telephone Encounter (Signed)
Please advise, in epic it says pt is dismissed by department unsure if this is true

## 2018-11-08 ENCOUNTER — Other Ambulatory Visit: Payer: Self-pay

## 2018-11-08 ENCOUNTER — Ambulatory Visit (INDEPENDENT_AMBULATORY_CARE_PROVIDER_SITE_OTHER): Payer: Managed Care, Other (non HMO) | Admitting: Family Medicine

## 2018-11-08 DIAGNOSIS — Z794 Long term (current) use of insulin: Secondary | ICD-10-CM

## 2018-11-08 DIAGNOSIS — I10 Essential (primary) hypertension: Secondary | ICD-10-CM

## 2018-11-08 DIAGNOSIS — E785 Hyperlipidemia, unspecified: Secondary | ICD-10-CM

## 2018-11-08 DIAGNOSIS — E1165 Type 2 diabetes mellitus with hyperglycemia: Secondary | ICD-10-CM

## 2018-11-08 LAB — LIPID PANEL

## 2018-11-09 LAB — COMPREHENSIVE METABOLIC PANEL
ALT: 18 IU/L (ref 0–44)
AST: 19 IU/L (ref 0–40)
Albumin/Globulin Ratio: 1.3 (ref 1.2–2.2)
Albumin: 3.7 g/dL — ABNORMAL LOW (ref 3.8–4.9)
Alkaline Phosphatase: 73 IU/L (ref 39–117)
BUN/Creatinine Ratio: 15 (ref 9–20)
BUN: 12 mg/dL (ref 6–24)
Bilirubin Total: 0.2 mg/dL (ref 0.0–1.2)
CO2: 26 mmol/L (ref 20–29)
Calcium: 8.9 mg/dL (ref 8.7–10.2)
Chloride: 95 mmol/L — ABNORMAL LOW (ref 96–106)
Creatinine, Ser: 0.81 mg/dL (ref 0.76–1.27)
GFR calc Af Amer: 114 mL/min/{1.73_m2} (ref >59)
GFR calc non Af Amer: 99 mL/min/{1.73_m2} (ref >59)
Globulin, Total: 2.8 g/dL (ref 1.5–4.5)
Glucose: 189 mg/dL — ABNORMAL HIGH (ref 65–99)
Potassium: 3.6 mmol/L (ref 3.5–5.2)
Sodium: 138 mmol/L (ref 134–144)
Total Protein: 6.5 g/dL (ref 6.0–8.5)

## 2018-11-09 LAB — HEMOGLOBIN A1C
Est. average glucose Bld gHb Est-mCnc: 226 mg/dL
Hgb A1c MFr Bld: 9.5 % — ABNORMAL HIGH (ref 4.8–5.6)

## 2018-11-09 LAB — LIPID PANEL
Chol/HDL Ratio: 3.3 ratio (ref 0.0–5.0)
Cholesterol, Total: 141 mg/dL (ref 100–199)
HDL: 43 mg/dL (ref >39)
LDL Calculated: 66 mg/dL (ref 0–99)
Triglycerides: 159 mg/dL — ABNORMAL HIGH (ref 0–149)
VLDL Cholesterol Cal: 32 mg/dL (ref 5–40)

## 2018-11-09 LAB — MAGNESIUM: Magnesium: 1.3 mg/dL — ABNORMAL LOW (ref 1.6–2.3)

## 2018-12-09 ENCOUNTER — Other Ambulatory Visit: Payer: Self-pay | Admitting: Family Medicine

## 2018-12-09 DIAGNOSIS — E1165 Type 2 diabetes mellitus with hyperglycemia: Secondary | ICD-10-CM

## 2018-12-09 NOTE — Telephone Encounter (Signed)
Forwarding medication refill to PCP for review. 

## 2018-12-14 ENCOUNTER — Other Ambulatory Visit: Payer: Self-pay | Admitting: Internal Medicine

## 2018-12-14 DIAGNOSIS — E119 Type 2 diabetes mellitus without complications: Secondary | ICD-10-CM

## 2018-12-14 DIAGNOSIS — Z794 Long term (current) use of insulin: Secondary | ICD-10-CM

## 2019-01-22 ENCOUNTER — Other Ambulatory Visit: Payer: Self-pay | Admitting: Family Medicine

## 2019-01-22 DIAGNOSIS — E1165 Type 2 diabetes mellitus with hyperglycemia: Secondary | ICD-10-CM

## 2019-01-22 NOTE — Telephone Encounter (Signed)
Requested medication (s) are due for refill today: yes Requested medication (s) are on the active medication list:yes Last refill:  11/04/2018  Future visit scheduled: no  Notes to clinic:  Review for refill    Requested Prescriptions  Pending Prescriptions Disp Refills   OZEMPIC, 0.25 OR 0.5 MG/DOSE, 2 MG/1.5ML SOPN [Pharmacy Med Name: OZEMPIC 0.25 OR 0.5MG /DOS 1X2MG  PEN] 3 mL     Sig: INJECT 0.5 MG UNDER THE SKIN ONCE A WEEK.     Endocrinology:  Diabetes - GLP-1 Receptor Agonists Failed - 01/22/2019  2:16 PM      Failed - HBA1C is between 0 and 7.9 and within 180 days    Hgb A1c MFr Bld  Date Value Ref Range Status  11/08/2018 9.5 (H) 4.8 - 5.6 % Final    Comment:             Prediabetes: 5.7 - 6.4          Diabetes: >6.4          Glycemic control for adults with diabetes: <7.0          Failed - Valid encounter within last 6 months    Recent Outpatient Visits          2 months ago Essential hypertension   [facility] [place], MD   12 months ago Type 2 diabetes mellitus with hyperglycemia, with long-term current use of insulin [facility]   [facility] [place], Ranell Patrick, MD   1 year ago AKI (acute kidney injury) [facility])   [facility] [place], Ranell Patrick, MD   1 year ago Hyperlipidemia, unspecified hyperlipidemia type   [facility] [place] [facility], Ranell Patrick, MD   2 years ago Type 2 diabetes mellitus with diabetic polyneuropathy, with long-term current use of insulin [facility])   [facility], Arlie Solomons, MD

## 2019-01-24 ENCOUNTER — Telehealth: Payer: Self-pay | Admitting: Internal Medicine

## 2019-01-24 DIAGNOSIS — E1165 Type 2 diabetes mellitus with hyperglycemia: Secondary | ICD-10-CM

## 2019-01-24 DIAGNOSIS — Z794 Long term (current) use of insulin: Secondary | ICD-10-CM

## 2019-01-24 NOTE — Telephone Encounter (Signed)
MEDICATION: Semaglutide,0.25 or 0.5MG /DOS, (OZEMPIC, 0.25 OR 0.5 MG/DOSE,) 2 MG/1.5ML SOPN WITH REFILLS  PHARMACY:   [id number] - [place], [place] - [address] AT [facility] OF [address] [phone] (Phone) [phone] (Fax)    IS THIS A 90 DAY SUPPLY : If possible  IS PATIENT OUT OF MEDICATION: yes  IF NOT; HOW MUCH IS LEFT: 0  LAST APPOINTMENT DATE: @7 /23/2020  NEXT APPOINTMENT DATE:@Visit  date not found  DO WE HAVE YOUR PERMISSION TO LEAVE A DETAILED MESSAGE: Yes  OTHER COMMENTS:    **Let patient know to contact pharmacy at the end of the day to make sure medication is ready. **  ** Please notify patient to allow 48-72 hours to process**  **Encourage patient to contact the pharmacy for refills or they can request refills through [facility]**

## 2019-01-25 MED ORDER — OZEMPIC (0.25 OR 0.5 MG/DOSE) 2 MG/1.5ML ~~LOC~~ SOPN: 0.5000 mg | PEN_INJECTOR | SUBCUTANEOUS | 0 refills | Status: DC

## 2019-01-25 NOTE — Telephone Encounter (Signed)
RX sent

## 2019-01-25 NOTE — Telephone Encounter (Signed)
Last office visit 04/14/2018  Cancel/No-show? One  Future office visit scheduled? No  Please advise.

## 2019-01-25 NOTE — Telephone Encounter (Signed)
Okay to refill for now. 

## 2019-03-10 ENCOUNTER — Other Ambulatory Visit: Payer: Self-pay | Admitting: Family Medicine

## 2019-03-10 DIAGNOSIS — I10 Essential (primary) hypertension: Secondary | ICD-10-CM

## 2019-03-10 NOTE — Telephone Encounter (Signed)
Requested Prescriptions  Pending Prescriptions Disp Refills  . hydrochlorothiazide (HYDRODIURIL) 25 MG tablet [Pharmacy Med Name: HYDROCHLOROTHIAZIDE 25MG  TABLETS] 90 tablet 1    Sig: TAKE 1 TABLET(25 MG) BY MOUTH DAILY     Cardiovascular: Diuretics - Thiazide Passed - 03/10/2019  9:18 AM      Passed - Ca in normal range and within 360 days    Calcium  Date Value Ref Range Status  11/08/2018 8.9 8.7 - 10.2 mg/dL Final         Passed - Cr in normal range and within 360 days    Creat  Date Value Ref Range Status  04/08/2016 0.66 (L) 0.70 - 1.33 mg/dL Final    Comment:      For patients > or = 58 years of age: The upper reference limit for Creatinine is approximately 13% higher for people identified as African-American.      Creatinine, Ser  Date Value Ref Range Status  11/08/2018 0.81 0.76 - 1.27 mg/dL Final         Passed - K in normal range and within 360 days    Potassium  Date Value Ref Range Status  11/08/2018 3.6 3.5 - 5.2 mmol/L Final         Passed - Na in normal range and within 360 days    Sodium  Date Value Ref Range Status  11/08/2018 138 134 - 144 mmol/L Final         Passed - Last BP in normal range    BP Readings from Last 1 Encounters:  10/09/18 120/75         Passed - Valid encounter within last 6 months    Recent Outpatient Visits          4 months ago Essential hypertension   [facility] [place], MD   5 months ago Hypomagnesemia   [facility] [place], Ranell Patrick, MD   1 year ago Type 2 diabetes mellitus with hyperglycemia, with long-term current use of insulin [facility])   [facility] [place], Ranell Patrick, MD   1 year ago AKI (acute kidney injury) [facility])   [facility] [place] [facility], Ranell Patrick, MD   1 year ago Hyperlipidemia, unspecified hyperlipidemia type   [facility] [place], Ranell Patrick, MD

## 2019-03-12 ENCOUNTER — Other Ambulatory Visit: Payer: Self-pay | Admitting: Family Medicine

## 2019-03-12 DIAGNOSIS — I10 Essential (primary) hypertension: Secondary | ICD-10-CM

## 2019-03-25 ENCOUNTER — Other Ambulatory Visit: Payer: Self-pay | Admitting: Internal Medicine

## 2019-03-25 DIAGNOSIS — E1165 Type 2 diabetes mellitus with hyperglycemia: Secondary | ICD-10-CM

## 2019-03-26 ENCOUNTER — Telehealth: Payer: Self-pay | Admitting: Internal Medicine

## 2019-03-26 DIAGNOSIS — Z794 Long term (current) use of insulin: Secondary | ICD-10-CM

## 2019-03-26 DIAGNOSIS — E1165 Type 2 diabetes mellitus with hyperglycemia: Secondary | ICD-10-CM

## 2019-03-26 DIAGNOSIS — E119 Type 2 diabetes mellitus without complications: Secondary | ICD-10-CM

## 2019-03-26 MED ORDER — METFORMIN HCL 1000 MG PO TABS: 1000.0000 mg | ORAL_TABLET | Freq: Two times a day (BID) | ORAL | 0 refills | Status: DC

## 2019-03-26 MED ORDER — OZEMPIC (0.25 OR 0.5 MG/DOSE) 2 MG/1.5ML ~~LOC~~ SOPN: 0.5000 mg | PEN_INJECTOR | SUBCUTANEOUS | 0 refills | Status: DC

## 2019-03-26 MED ORDER — LANTUS SOLOSTAR 100 UNIT/ML ~~LOC~~ SOPN: 52.0000 [IU] | PEN_INJECTOR | Freq: Every day | SUBCUTANEOUS | 0 refills | Status: DC

## 2019-03-26 MED ORDER — INSULIN REGULAR HUMAN 100 UNIT/ML IJ SOLN: INTRAMUSCULAR | 0 refills | Status: DC

## 2019-03-26 NOTE — Telephone Encounter (Signed)
MEDICATION: Ozempic, Novolin R, Lantus Solostar, Test Strips for One Touch, Metformin 1000 MG  PHARMACY:  [place] [facility] [place] A 90 DAY SUPPLY :   IS PATIENT OUT OF MEDICATION: no   IF NOT; HOW MUCH IS LEFT:   LAST APPOINTMENT DATE: @11 /05/2018  NEXT APPOINTMENT DATE:@Visit  date not found  DO WE HAVE YOUR PERMISSION TO LEAVE A DETAILED MESSAGE: yes - [phone]  OTHER COMMENTS:    **Let patient know to contact pharmacy at the end of the day to make sure medication is ready. **  ** Please notify patient to allow 48-72 hours to process**  **Encourage patient to contact the pharmacy for refills or they can request refills through [facility]**

## 2019-03-26 NOTE — Telephone Encounter (Signed)
Please advise patient has not been seen since Nov 2019.

## 2019-03-26 NOTE — Telephone Encounter (Signed)
Due to the coronavirus pandemic, I will give him another chance to schedule a visit.  In the meantime, let us refill his medications for 3 months without further refills

## 2019-03-26 NOTE — Telephone Encounter (Signed)
90 day supply sent for requested medications, patient must have appointment for any further refills.  Routing to front desk to assist getting patient scheduled. I also sent him a MyChart message.

## 2019-03-29 ENCOUNTER — Telehealth: Payer: Self-pay

## 2019-03-29 ENCOUNTER — Other Ambulatory Visit: Payer: Self-pay | Admitting: Internal Medicine

## 2019-03-29 DIAGNOSIS — Z794 Long term (current) use of insulin: Secondary | ICD-10-CM

## 2019-03-29 DIAGNOSIS — E119 Type 2 diabetes mellitus without complications: Secondary | ICD-10-CM

## 2019-03-29 MED ORDER — BD PEN NEEDLE NANO U/F 32G X 4 MM MISC: 0 refills | Status: DC

## 2019-03-29 MED ORDER — "RELION INSULIN SYRINGE 31G X 15/64"" 0.5 ML MISC": 0 refills | Status: AC

## 2019-03-29 NOTE — Telephone Encounter (Signed)
These has now been sent for a 90 day supply. We will NOT refill again until he is seen, patient wife made aware of this but declined to schedule at this time.

## 2019-03-29 NOTE — Telephone Encounter (Signed)
Patients wife called in needing a refill om husbands   MEDICATION: insulin regular (NOVOLIN R RELION) 100 units/mL injection, BD PEN NEEDLE NANO U/F 32G X 4 MM MISC, RELION INSULIN SYRINGE 31G X 15/64" 0.5 ML MISC   PHARMACY:  [facility]  IS THIS A 90 DAY SUPPLY : yes  IS PATIENT OUT OF MEDICATION:   IF NOT; HOW MUCH IS LEFT:   LAST APPOINTMENT DATE: @11 /09/2018  NEXT APPOINTMENT DATE:@Visit  date not found  DO WE HAVE YOUR PERMISSION TO LEAVE A DETAILED MESSAGE:  OTHER COMMENTS:    **Let patient know to contact pharmacy at the end of the day to make sure medication is ready. **  ** Please notify patient to allow 48-72 hours to process**  **Encourage patient to contact the pharmacy for refills or they can request refills through [facility]**

## 2019-04-30 ENCOUNTER — Other Ambulatory Visit: Payer: Self-pay

## 2019-04-30 DIAGNOSIS — Z20822 Contact with and (suspected) exposure to covid-19: Secondary | ICD-10-CM

## 2019-05-02 LAB — NOVEL CORONAVIRUS, NAA: SARS-CoV-2, NAA: NOT DETECTED

## 2019-05-03 ENCOUNTER — Telehealth: Payer: Self-pay | Admitting: General Practice

## 2019-05-03 NOTE — Telephone Encounter (Signed)
Pt called in for covid result.  °Advised of Not Detected result.  °

## 2019-05-29 ENCOUNTER — Ambulatory Visit (INDEPENDENT_AMBULATORY_CARE_PROVIDER_SITE_OTHER): Payer: 59 | Admitting: Internal Medicine

## 2019-05-29 ENCOUNTER — Telehealth: Payer: Self-pay | Admitting: Family Medicine

## 2019-05-29 ENCOUNTER — Telehealth: Payer: Self-pay | Admitting: Internal Medicine

## 2019-05-29 ENCOUNTER — Other Ambulatory Visit: Payer: Self-pay | Admitting: Family Medicine

## 2019-05-29 ENCOUNTER — Encounter: Payer: Self-pay | Admitting: Internal Medicine

## 2019-05-29 ENCOUNTER — Other Ambulatory Visit: Payer: Self-pay

## 2019-05-29 VITALS — BP 140/86 | HR 90 | Ht 70.0 in | Wt 206.0 lb

## 2019-05-29 DIAGNOSIS — Z794 Long term (current) use of insulin: Secondary | ICD-10-CM | POA: Diagnosis not present

## 2019-05-29 DIAGNOSIS — E785 Hyperlipidemia, unspecified: Secondary | ICD-10-CM

## 2019-05-29 DIAGNOSIS — E663 Overweight: Secondary | ICD-10-CM | POA: Diagnosis not present

## 2019-05-29 DIAGNOSIS — E119 Type 2 diabetes mellitus without complications: Secondary | ICD-10-CM

## 2019-05-29 DIAGNOSIS — I10 Essential (primary) hypertension: Secondary | ICD-10-CM

## 2019-05-29 LAB — POCT GLYCOSYLATED HEMOGLOBIN (HGB A1C): Hemoglobin A1C: 9.3 % — AB (ref 4.0–5.6)

## 2019-05-29 MED ORDER — ONETOUCH VERIO W/DEVICE KIT: PACK | 0 refills | Status: DC

## 2019-05-29 MED ORDER — GLUCOSE BLOOD VI STRP: ORAL_STRIP | 12 refills | Status: DC

## 2019-05-29 MED ORDER — LANTUS SOLOSTAR 100 UNIT/ML ~~LOC~~ SOPN: 25.0000 [IU] | PEN_INJECTOR | Freq: Every day | SUBCUTANEOUS | 5 refills | Status: DC

## 2019-05-29 MED ORDER — ONETOUCH ULTRASOFT LANCETS MISC: 12 refills | Status: DC

## 2019-05-29 NOTE — Patient Instructions (Addendum)
Please continue: - Metformin 1000 mg 2x a day - Ozempic 0.5 mg weekly - R insulin 30 minutes before meals: 14-20 units before meals  Please restart: - Lantus 25 units in am and increase by 4 units every 4 days until sugars in am are <130.   Please return in 3-4 months with your sugar log.

## 2019-05-29 NOTE — Telephone Encounter (Signed)
ramipril (ALTACE) 10 MG capsule [id number]   [id number] - [place], [place] - [address] AT [facility] [address] [facility] [address]  [address], [place] [address]   Patient's wife calling in the request for the refill says that the patient is out of medication

## 2019-05-29 NOTE — Progress Notes (Signed)
Subjective:     Patient ID: Raymond Pugh, male   DOB: January 14, 1961, 59 y.o.   MRN: [id number]  Diabetes  Mr. Raymond Pugh is a 59 y.o. man,returning for f/u for management of DM2, dx 2008, uncontrolled, insulin-dependent, with complications (peripheral neuropathy). Last visit 1 year and 2 months ago.  He is not usually compliant with appointments and insulin doses.  He stopped Lantus completely 2 months ago.  Reviewed HbA1c levels: Lab Results  Component Value Date   HGBA1C 9.5 (H) 11/08/2018   HGBA1C 10.4 (A) 02/21/2018   HGBA1C 10.1 03/25/2017   He is now on: - Metformin 1000 mg 2x a day - Ozempic 0.5 mg weekly - Lantus 56-60 units at dinnertime >> stopped 2 mo ago! - R insulin - take 30 minutes before meals: 14-20 units before "most" meals  Previously on Glipizide. He has hypoglycemia awareness at <90.  He checks his sugars 2-3 times a day per his recall: - a.m.: 116-174, 200-378 >> 305-319 >> 93, 125-270 >> 135, 150-190 - 2h after b'fast: 170-272 >> 198-465 >> 264, 270 >> 135-150 - before lunch: see above >> 231-471 >> 217-315 >> 200s - 2h after lunch: 71-254, 368 >> 124, 276-360 >> 315, 323 >> n/c - before dinne/r: 180-297 >> 149, 315 >> 109, 124, 298, 349 >> 190-200 - after dinner: 192-441, 598 >> 161-405 >> 94, 134-466 >> 200-300 - bedtime: see above >> 303-429 >> 187-366, 381 >> n/c Highest: 500s (after a camping trip) >> 481 >> 598 >> n/c >> 400 (Christmas) Lowest: 60 x1 >> 84 >> 71 (mid-day) >> 124 >> 93 >> 50s at night - given too much R insulin He had sugars of 600s in 12/2015 as he felt low and started to drink juice without checking his sugars!   -No CKD. Last BUN/Cr: Lab Results  Component Value Date   BUN 12 11/08/2018   Lab Results  Component Value Date   CREATININE 0.81 11/08/2018  On ramipril. -+ HL; last lipids: Lab Results  Component Value Date   CHOL 141 11/08/2018   HDL 43 11/08/2018   LDLCALC 66 11/08/2018   TRIG 159 (H) 11/08/2018   CHOLHDL 3.3  11/08/2018  On lovastatin. - Last eye exam: 01/2017: Reportedly no DR.  [facility]. He has a history of retinal detachment surgery.  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   Review of Systems Constitutional: no weight gain/no weight loss, no fatigue, no subjective hyperthermia, no subjective hypothermia Eyes: no blurry vision, no xerophthalmia ENT: no sore throat, no nodules palpated in neck, no dysphagia, no odynophagia, no hoarseness Cardiovascular: no CP/no SOB/no palpitations/no leg swelling Respiratory: no cough/no SOB/no wheezing Gastrointestinal: no N/no V/no D/no C/no acid reflux Musculoskeletal: no muscle aches/no joint aches Skin: no rashes, no hair loss Neurological: no tremors/no numbness/no tingling/no dizziness  I reviewed pt's medications, allergies, PMH, social hx, family hx, and changes were documented in the history of present illness. Otherwise, unchanged from my initial visit note.  Past Medical History:  Diagnosis Date  . ADHD (attention deficit hyperactivity disorder)   . Anemia   . Barrett's esophagus   . Diabetes mellitus   . Hiatal hernia   . Hyperlipemia   . Hypertension   . Myocarditis ([place])    h/o mypocarditis, caused cardiomyopathy-- resolved    Past Surgical History:  Procedure Laterality Date  . ABSCESS DRAIN LIVER PERC ([place] [facility])     klebsiella on liver  pa states   . EYE SURGERY    . HYDROCELE EXCISION / REPAIR    . radiokeratotomy    . RETINAL DETACHMENT SURGERY     Social History   Socioeconomic History  . Marital status: Married    Spouse name: Not on file  . Number of children: 2  . Years of education: Not on file  . Highest education level: Not on file  Occupational History  . Occupation: Ship broker, part time job    Comment: Market researcher, Architect  Tobacco Use  . Smoking status: Never Smoker  . Smokeless tobacco: Never Used  Substance and Sexual Activity  . Alcohol use: Yes    Alcohol/week: 1.0  standard drinks    Types: 1 Cans of beer per week    Comment: socially   . Drug use: No  . Sexual activity: Not on file    Comment: not asked  Other Topics Concern  . Not on file  Social History Narrative   2 children + 2 step children ---    Has a part time job, Ship broker    Exercise swimming   Social Determinants of Health   Financial Resource Strain:   . Difficulty of Paying Living Expenses: Not on file  Food Insecurity:   . Worried About Charity fundraiser in the Last Year: Not on file  . Ran Out of Food in the Last Year: Not on file  Transportation Needs:   . Lack of Transportation (Medical): Not on file  . Lack of Transportation (Non-Medical): Not on file  Physical Activity:   . Days of Exercise per Week: Not on file  . Minutes of Exercise per Session: Not on file  Stress:   . Feeling of Stress : Not on file  Social Connections:   . Frequency of Communication with Friends and Family: Not on file  . Frequency of Social Gatherings with Friends and Family: Not on file  . Attends Religious Services: Not on file  . Active Member of Clubs or Organizations: Not on file  . Attends Archivist Meetings: Not on file  . Marital Status: Not on file  Intimate Partner Violence:   . Fear of Current or Ex-Partner: Not on file  . Emotionally Abused: Not on file  . Physically Abused: Not on file  . Sexually Abused: Not on file   Current Outpatient Medications on File Prior to Visit  Medication Sig Dispense Refill  . ALPRAZolam (XANAX) 0.5 MG tablet Take 1 tablet (0.5 mg total) by mouth 2 (two) times daily as needed for anxiety. 20 tablet 0  . amphetamine-dextroamphetamine (ADDERALL XR) 20 MG 24 hr capsule Take 1 Capsules by mouth every day prn 30 capsule 0  . BD PEN NEEDLE NANO U/F 32G X 4 MM MISC Use 4 times daily NO REFILLS WITHOUT APPOINTMENT 400 each 0  . glucose blood (ACCU-CHEK GUIDE) test strip TEST FOUR TIMES DAILY AS DIRECTED 400 each 2  . hydrochlorothiazide  (HYDRODIURIL) 25 MG tablet TAKE 1 TABLET(25 MG) BY MOUTH DAILY 90 tablet 1  . Insulin Glargine (LANTUS SOLOSTAR) 100 UNIT/ML Solostar Pen Inject 52 Units into the skin at bedtime. NO REFILLS WITHOUT APPOINTMENT 15 pen 0  . insulin regular (NOVOLIN R RELION) 100 units/mL injection Inject 0.16-0.2 mLs (16-20 Units total) into the skin 3 (three) times daily before meals. NO FURTHER REFILLS WITHOUT APPOINTMENT 20 mL 0  . Insulin Syringe-Needle U-100 (RELION INSULIN SYRINGE) 31G X 15/64" 0.5 ML MISC Use to inject insulin  3 times a day. NO REFILLS WITHOUT APPOINTMENT 300 each 0  . Lancet Devices (ACCU-CHEK SOFTCLIX) lancets Use as instructed 1 each 0  . lovastatin (MEVACOR) 20 MG tablet Take 1 tablet (20 mg total) by mouth at bedtime. 90 tablet 1  . magnesium chloride (SLOW-MAG) 64 MG TBEC SR tablet Take 1 tablet (64 mg total) by mouth daily. 30 tablet 1  . metFORMIN (GLUCOPHAGE) 1000 MG tablet Take 1 tablet (1,000 mg total) by mouth 2 (two) times daily. NO FURTHER REFILLS WITHOUT APPOINTMENT 180 tablet 0  . metoprolol tartrate (LOPRESSOR) 100 MG tablet TAKE 1 TABLET(100 MG) BY MOUTH TWICE DAILY 180 tablet 1  . ONETOUCH DELICA LANCETS FINE MISC Use to test blood sugar 4-6 times daily as instructed. Dx: E11.51 200 each 3  . pantoprazole (PROTONIX) 40 MG tablet TAKE 1 TABLET BY MOUTH EVERY MORNING BEFORE BREAKFAST 90 tablet 1  . ramipril (ALTACE) 10 MG capsule Take 1 capsule (10 mg total) by mouth 2 (two) times daily. 180 capsule 1  . Semaglutide,0.25 or 0.5MG /DOS, (OZEMPIC, 0.25 OR 0.5 MG/DOSE,) 2 MG/1.5ML SOPN Inject 0.5 mg into the skin once a week. NO REFILLS WITHOUT APPOINTMENT 3 pen 0   No current facility-administered medications on file prior to visit.   No Known Allergies Family History  Problem Relation Age of Onset  . Diabetes Father   . Hypertension Father   . Colon polyps Father   . Hypertension Mother   . Asthma Mother   . Prostate cancer Neg Hx      Objective:   Physical Exam BP  140/86   Pulse 90   Ht 5\' 10"  (1.778 m)   Wt 206 lb (93.4 kg)   SpO2 97%   BMI 29.56 kg/m  Body mass index is 29.56 kg/m. Wt Readings from Last 3 Encounters:  05/29/19 206 lb (93.4 kg)  10/09/18 210 lb (95.3 kg)  04/14/18 228 lb (103.4 kg)   Constitutional: overweight, in NAD Eyes: PERRLA, EOMI, no exophthalmos ENT: moist mucous membranes, no thyromegaly, no cervical lymphadenopathy Cardiovascular: RRR, No MRG Respiratory: CTA B Gastrointestinal: abdomen soft, NT, ND, BS+ Musculoskeletal: no deformities, strength intact in all 4 Skin: moist, warm, no rashes Neurological: no tremor with outstretched hands, DTR normal in all 4   Assessment:     1.  DM2, uncontrolled, insulin-dependent, with complications - peripheral neuropathy - stable  2. HL  3.  Overweight  Plan:     1. Pt with longstanding, uncontrolled, type 2 diabetes, with noncompliance with visits and insulin doses.  He returns after an absence of more than a year.  At last visit, we added Ozempic and I advised him to reduce his insulin doses if he can start the medication.  We also discussed about reducing dietary restrictions especially sweet drinks.  With these, his sugars were increasing to 300s and 400s in the past.  I also emphasized compliance with insulin doses, which she was missing frequently. -We reviewed together latest HbA1c which was 9.5% at last check -At this visit, I again emphasized the importance of keeping the appointments. -He tells me that he only rapidly since last visit as he noticed that his sugars were improving almost.  He actually stopped the long-acting insulin completely 2 months ago.  At this visit, sugars are high, still almost all above target.  We will restart Lantus at the lower dose and I advised him to take it in the morning since he did have 2 instances when he had low  blood sugars at night.  However, he feels that these were related to taking too much regular insulin before dinner.   We will continue with the lower dose of regular insulin.  I would not change his Ozempic dose now, but we can consider increasing the dose at next visit, since he is tolerating this well - I advised him to: Patient Instructions  Please continue: - Metformin 1000 mg 2x a day - Ozempic 0.5 mg weekly - R insulin 30 minutes before meals: 14-20 units before meals  Please restart: - Lantus 25 units in am and increase by 4 units every 4 days until sugars in am are <130.   Please return in 3-4 months with your sugar log.   - we checked his HbA1c: 9.3% (slightly lower) - advised to check sugars at different times of the day - 3x a day, rotating check times - advised for yearly eye exams >> he is not UTD - return to clinic in 3-4 months    2. HL -Reviewed latest lipid panel from 10/2018: LDL at goal, triglycerides slightly high Lab Results  Component Value Date   CHOL 141 11/08/2018   HDL 43 11/08/2018   LDLCALC 66 11/08/2018   TRIG 159 (H) 11/08/2018   CHOLHDL 3.3 11/08/2018  -Continues lovastatin without side effects  3.  Overweight -He gained approximately 17 pounds before last visit, but this was more than a year ago - at this visit, she is -22 pounds compared to last visit, most likely an effect of Ozempic - Continue Ozempic which also helps with weight loss  [name], MD PhD [facility] End[facility]

## 2019-05-29 NOTE — Telephone Encounter (Signed)
onetouch meter, strips and lancets sent.

## 2019-05-29 NOTE — Telephone Encounter (Signed)
Call patients wife for better information . [phone]

## 2019-05-29 NOTE — Addendum Note (Signed)
Addended by: Cardell Peach I on: 05/29/2019 03:16 PM   Modules accepted: Orders

## 2019-05-29 NOTE — Telephone Encounter (Signed)
Pt has called the insurance company and the one touch and freestyle brands are covered by them.

## 2019-05-29 NOTE — Addendum Note (Signed)
Addended by: Cardell Peach I on: 05/29/2019 08:36 AM   Modules accepted: Orders

## 2019-05-30 NOTE — Telephone Encounter (Signed)
Requested medication (s) are due for refill today: yes  Requested medication (s) are on the active medication list: yes  Last refill:  12/11/2018  Future visit scheduled: yes  Notes to clinic: no valid encounter in last 6 months   Requested Prescriptions  Pending Prescriptions Disp Refills   ramipril (ALTACE) 10 MG capsule [Pharmacy Med Name: RAMIPRIL 10MG  CAPSULES] 180 capsule 1    Sig: TAKE 1 CAPSULE(10 MG) BY MOUTH TWICE DAILY      Cardiovascular:  ACE Inhibitors Failed - 05/29/2019  6:05 PM      Failed - Cr in normal range and within 180 days    Creat  Date Value Ref Range Status  04/08/2016 0.66 (L) 0.70 - 1.33 mg/dL Final    Comment:      For patients > or = 59 years of age: The upper reference limit for Creatinine is approximately 13% higher for people identified as African-American.      Creatinine, Ser  Date Value Ref Range Status  11/08/2018 0.81 0.76 - 1.27 mg/dL Final   Creatinine,U  Date Value Ref Range Status  07/24/2009 293.3 mg/dL Final          Failed - K in normal range and within 180 days    Potassium  Date Value Ref Range Status  11/08/2018 3.6 3.5 - 5.2 mmol/L Final          Failed - Last BP in normal range    BP Readings from Last 1 Encounters:  05/29/19 140/86          Failed - Valid encounter within last 6 months    Recent Outpatient Visits           6 months ago Essential hypertension   [facility], [place]   7 months ago Hypomagnesemia   [facility], Ranell Patrick, MD   1 year ago Type 2 diabetes mellitus with hyperglycemia, with long-term current use of insulin [employer] [facility] [employer])   [facility], Ranell Patrick, MD   1 year ago AKI (acute kidney injury) [facility])   [facility], Ranell Patrick, MD   1 year ago Hyperlipidemia, unspecified hyperlipidemia type   [facility], Ranell Patrick, MD       Future Appointments             In 2 days Wendie Agreste, MD  [facility] [place], [place] - Patient is not pregnant

## 2019-06-01 ENCOUNTER — Telehealth (INDEPENDENT_AMBULATORY_CARE_PROVIDER_SITE_OTHER): Payer: 59 | Admitting: Family Medicine

## 2019-06-01 ENCOUNTER — Other Ambulatory Visit: Payer: Self-pay

## 2019-06-01 ENCOUNTER — Encounter: Payer: Self-pay | Admitting: Family Medicine

## 2019-06-01 VITALS — BP 147/78 | HR 87 | Temp 97.0°F | Wt 209.0 lb

## 2019-06-01 DIAGNOSIS — E785 Hyperlipidemia, unspecified: Secondary | ICD-10-CM

## 2019-06-01 DIAGNOSIS — K2100 Gastro-esophageal reflux disease with esophagitis, without bleeding: Secondary | ICD-10-CM | POA: Diagnosis not present

## 2019-06-01 DIAGNOSIS — E1165 Type 2 diabetes mellitus with hyperglycemia: Secondary | ICD-10-CM

## 2019-06-01 DIAGNOSIS — F418 Other specified anxiety disorders: Secondary | ICD-10-CM

## 2019-06-01 DIAGNOSIS — Z794 Long term (current) use of insulin: Secondary | ICD-10-CM

## 2019-06-01 DIAGNOSIS — I10 Essential (primary) hypertension: Secondary | ICD-10-CM

## 2019-06-01 MED ORDER — RAMIPRIL 10 MG PO CAPS: ORAL_CAPSULE | ORAL | 1 refills | Status: DC

## 2019-06-01 MED ORDER — ALPRAZOLAM 0.5 MG PO TABS: 0.5000 mg | ORAL_TABLET | Freq: Two times a day (BID) | ORAL | 0 refills | Status: DC | PRN

## 2019-06-01 MED ORDER — HYDROCHLOROTHIAZIDE 25 MG PO TABS: ORAL_TABLET | ORAL | 1 refills | Status: DC

## 2019-06-01 MED ORDER — LOVASTATIN 20 MG PO TABS: 20.0000 mg | ORAL_TABLET | Freq: Every day | ORAL | 1 refills | Status: DC

## 2019-06-01 MED ORDER — OMEPRAZOLE 20 MG PO CPDR: 20.0000 mg | DELAYED_RELEASE_CAPSULE | Freq: Every day | ORAL | 1 refills | Status: DC

## 2019-06-01 MED ORDER — METOPROLOL TARTRATE 100 MG PO TABS: ORAL_TABLET | ORAL | 1 refills | Status: DC

## 2019-06-01 NOTE — Progress Notes (Signed)
Virtual Visit via Telephone Note  I connected with Raymond Pugh on 06/12/19 at 3:35 PM by telephone and verified that I am speaking with the correct person using two identifiers.   I discussed the limitations, risks, security and privacy concerns of performing an evaluation and management service by telephone and the availability of in person appointments. I also discussed with the patient that there may be a patient responsible charge related to this service. The patient expressed understanding and agreed to proceed, consent obtained  Chief complaint: Chief Complaint  Patient presents with  . Medication Refill    med refill/ f/u with no other issues at this time    History of Present Illness: Raymond Pugh is a 59 y.o. male   Diabetes: Complicated by hyperglycemia, nonadherence.  Followed by endocrinology - Dr. Cruzita Lederer, last appt 05/29/19.  Previous appointment approximately 1 year prior.  Restarted Lantus low dose, morning dosing to minimize hypoglycemia, increasing 4 units every 4 days until a.m. blood sugars under 130.  Continued lower dose of regular insulin, continue Ozempic and Metformin.  68-monthfollow-up planned. He is on statin, ACE inhibitor  Microalbumin:  Optho, foot exam, pneumovax: Due for microalbumin, pneumococcal vaccine, colonoscopy- Jan 2016 - no polyps.  Declines pneumonia vaccine, flu vaccine. Will agree to covid vaccine.  Optho exam 06/29/2018 Recent podiatry visit.   Lab Results  Component Value Date   MICROALBUR 1.5 11/24/2015   MICROALBUR 0.4 11/21/2014    Lab Results  Component Value Date   HGBA1C 9.3 (A) 05/29/2019   HGBA1C 9.5 (H) 11/08/2018   HGBA1C 10.4 (A) 02/21/2018   Lab Results  Component Value Date   MICROALBUR 1.5 11/24/2015   LDLCALC 66 11/08/2018   CREATININE 0.81 11/08/2018   Situational anxiety Rare use of alprazolam.  Last discussed Oct 09, 2018 telemedicine visit.Controlled substance database (PDMP) reviewed. No concerns  appreciated.  Last alprazolam prescription Oct 09, 2018. Has few left still. Still rare use - used over Christmas. Anxiety has been stable.   Hypertension: HCTZ 25 mg daily, ramipril 10 mg BID, metoprolol 100 mg twice daily Home readings:147/78 today. Out of ramipril past 2 weeks.  Usually 125/80 on meds.  BP Readings from Last 3 Encounters:  06/01/19 (!) 147/78  05/29/19 140/86  10/09/18 120/75   Lab Results  Component Value Date   CREATININE 0.81 11/08/2018  Constitutional: Negative for fatigue and unexpected weight change.  Eyes: Negative for visual disturbance.  Respiratory: Negative for cough, chest tightness and shortness of breath.   Cardiovascular: Negative for chest pain, palpitations and leg swelling.  Gastrointestinal: Negative for abdominal pain and blood in stool.  Neurological: Negative for dizziness, light-headedness and headaches.    GERD: Protonix 40 mg in past. Using omeprazole instead for cost reasons. Taking 1 per day.   Hyperlipidemia: Lovastatin 20 mg daily. No new myalgias/side effects.  Lab Results  Component Value Date   CHOL 141 11/08/2018   HDL 43 11/08/2018   LDLCALC 66 11/08/2018   TRIG 159 (H) 11/08/2018   CHOLHDL 3.3 11/08/2018   Lab Results  Component Value Date   ALT 18 11/08/2018   AST 19 11/08/2018   ALKPHOS 73 11/08/2018   BILITOT 0.2 11/08/2018        Patient Active Problem List   Diagnosis Date Noted  . Overweight (BMI 25.0-29.9) 05/29/2019  . Liver abscess 06/24/2014  . Eyelid lesion 05/29/2012  . Obesity 08/25/2010  . BARRETTS ESOPHAGUS 07/03/2010  . Iron deficiency anemia, unspecified 11/16/2009  .  Hyperlipidemia 11/04/2008  . ADHD 08/14/2007  . MOLE 03/13/2007  . Uncontrolled type 2 diabetes mellitus with peripheral angiopathy ([place]) 03/13/2007  . Essential hypertension 11/03/2006   Past Medical History:  Diagnosis Date  . ADHD (attention deficit hyperactivity disorder)   . Anemia   . Barrett's esophagus   .  Diabetes mellitus   . Hiatal hernia   . Hyperlipemia   . Hypertension   . Myocarditis ([place])    h/o mypocarditis, caused cardiomyopathy-- resolved    Past Surgical History:  Procedure Laterality Date  . ABSCESS DRAIN LIVER PERC ([place] [facility])     klebsiella on liver pa states   . EYE SURGERY    . HYDROCELE EXCISION / REPAIR    . radiokeratotomy    . RETINAL DETACHMENT SURGERY     No Known Allergies Prior to Admission medications   Medication Sig Start Date End Date Taking? Authorizing Provider  ALPRAZolam Duanne Moron) 0.5 MG tablet Take 1 tablet (0.5 mg total) by mouth 2 (two) times daily as needed for anxiety. 10/09/18  Yes Wendie Agreste, MD  amphetamine-dextroamphetamine (ADDERALL XR) 20 MG 24 hr capsule Take 1 Capsules by mouth every day prn 11/24/15  Yes Daub, Loura Back, MD  BD PEN NEEDLE NANO U/F 32G X 4 MM MISC Use 4 times daily NO REFILLS WITHOUT APPOINTMENT 03/29/19  Yes [name], MD  Blood Glucose Monitoring Suppl (ONETOUCH VERIO) w/Device KIT Use to check blood sugar 3 times a day. 05/29/19  Yes [name], MD  glucose blood test strip Use to check blood sugar 3 times a day 05/29/19  Yes [name], MD  hydrochlorothiazide (HYDRODIURIL) 25 MG tablet TAKE 1 TABLET(25 MG) BY MOUTH DAILY 03/10/19  Yes Wendie Agreste, MD  Insulin Glargine (LANTUS SOLOSTAR) 100 UNIT/ML Solostar Pen Inject 25 Units into the skin daily. 05/29/19  Yes [name], MD  insulin regular (NOVOLIN R RELION) 100 units/mL injection Inject 0.16-0.2 mLs (16-20 Units total) into the skin 3 (three) times daily before meals. NO FURTHER REFILLS WITHOUT APPOINTMENT 03/29/19  Yes [name], MD  Insulin Syringe-Needle U-100 (RELION INSULIN SYRINGE) 31G X 15/64" 0.5 ML MISC Use to inject insulin 3 times a day. NO REFILLS WITHOUT APPOINTMENT 03/29/19  Yes [name], MD  Lancet Devices [facility]) lancets Use as instructed 05/25/17  Yes [name], MD  Lancets [facility]  ULTRASOFT) lancets Use to check blood sugar 3 times a day 05/29/19  Yes [name], MD  lovastatin (MEVACOR) 20 MG tablet Take 1 tablet (20 mg total) by mouth at bedtime. 10/09/18  Yes Wendie Agreste, MD  magnesium chloride (SLOW-MAG) 64 MG TBEC SR tablet Take 1 tablet (64 mg total) by mouth daily. 01/30/18  Yes Wendie Agreste, MD  metFORMIN (GLUCOPHAGE) 1000 MG tablet Take 1 tablet (1,000 mg total) by mouth 2 (two) times daily. NO FURTHER REFILLS WITHOUT APPOINTMENT 03/26/19  Yes [name], MD  metoprolol tartrate (LOPRESSOR) 100 MG tablet TAKE 1 TABLET(100 MG) BY MOUTH TWICE DAILY 03/13/19  Yes Wendie Agreste, MD  [facility] DELICA LANCETS FINE MISC Use to test blood sugar 4-6 times daily as instructed. Dx: E11.51 11/24/15  Yes Darlyne Russian, MD  pantoprazole (PROTONIX) 40 MG tablet TAKE 1 TABLET BY MOUTH EVERY MORNING BEFORE BREAKFAST 10/09/18  Yes Wendie Agreste, MD  ramipril (ALTACE) 10 MG capsule TAKE 1 CAPSULE(10 MG) BY MOUTH TWICE DAILY 05/30/19  Yes Wendie Agreste, MD  Semaglutide,0.25 or 0.5MG/DOS, (OZEMPIC, 0.25 OR 0.5 MG/DOSE,) 2  MG/1.5ML SOPN Inject 0.5 mg into the skin once a week. NO REFILLS WITHOUT APPOINTMENT 03/26/19  Yes [name], MD   Social History   Socioeconomic History  . Marital status: Married    Spouse name: Not on file  . Number of children: 2  . Years of education: Not on file  . Highest education level: Not on file  Occupational History  . Occupation: Ship broker, part time job    Comment: Market researcher, Architect  Tobacco Use  . Smoking status: Never Smoker  . Smokeless tobacco: Never Used  Substance and Sexual Activity  . Alcohol use: Yes    Alcohol/week: 1.0 standard drinks    Types: 1 Cans of beer per week    Comment: socially   . Drug use: No  . Sexual activity: Not on file    Comment: not asked  Other Topics Concern  . Not on file  Social History Narrative   2 children + 2 step children ---    Has a part time job, Ship broker     Exercise swimming   Social Determinants of Health   Financial Resource Strain:   . Difficulty of Paying Living Expenses: Not on file  Food Insecurity:   . Worried About Charity fundraiser in the Last Year: Not on file  . Ran Out of Food in the Last Year: Not on file  Transportation Needs:   . Lack of Transportation (Medical): Not on file  . Lack of Transportation (Non-Medical): Not on file  Physical Activity:   . Days of Exercise per Week: Not on file  . Minutes of Exercise per Session: Not on file  Stress:   . Feeling of Stress : Not on file  Social Connections:   . Frequency of Communication with Friends and Family: Not on file  . Frequency of Social Gatherings with Friends and Family: Not on file  . Attends Religious Services: Not on file  . Active Member of Clubs or Organizations: Not on file  . Attends Archivist Meetings: Not on file  . Marital Status: Not on file  Intimate Partner Violence:   . Fear of Current or Ex-Partner: Not on file  . Emotionally Abused: Not on file  . Physically Abused: Not on file  . Sexually Abused: Not on file     Observations/Objective: Vitals:   06/01/19 0846  BP: (!) 147/78  Pulse: 87  Temp: (!) 97 F (36.1 C)  TempSrc: Temporal  Weight: 209 lb (94.8 kg)  Home vitals.   Assessment and Plan: Gastroesophageal reflux disease with esophagitis, unspecified whether hemorrhage - Plan: omeprazole (PRILOSEC) 20 MG capsule  -Stable with omeprazole usage.  Continue same.   Essential hypertension - Plan: hydrochlorothiazide (HYDRODIURIL) 25 MG tablet, ramipril (ALTACE) 10 MG capsule, metoprolol tartrate (LOPRESSOR) 100 MG tablet  Elevated off meds, stable when taking medications.  Continue same regimen for now   Situational anxiety - Plan: ALPRAZolam (XANAX) 0.5 MG tablet  -Rare use of alprazolam, overall controlled.  No change in regimen for now.  RTC precautions if her symptoms Hyperlipidemia, unspecified hyperlipidemia type -  Plan: lovastatin (MEVACOR) 20 MG tablet  -Tolerating statin, continue same  Type 2 diabetes mellitus with hyperglycemia, with long-term current use of insulin (HCC) - Plan: Microalbumin / creatinine urine ratio, Comprehensive metabolic panel, Lipid panel  -Followed by endocrinology with recent med adjustment.  Continue ongoing care   Follow Up Instructions:   There are no Patient Instructions on file for this visit.  I discussed the assessment and treatment plan with the patient. The patient was provided an opportunity to ask questions and all were answered. The patient agreed with the plan and demonstrated an understanding of the instructions.   The patient was advised to call back or seek an in-person evaluation if the symptoms worsen or if the condition fails to improve as anticipated.  I provided approximately 15 minutes of non-face-to-face time during this encounter with telephone encounter as well as chart review.  Signed,   [name], MD [facility] [place].  06/12/19

## 2019-06-08 ENCOUNTER — Ambulatory Visit: Payer: 59

## 2019-06-12 ENCOUNTER — Other Ambulatory Visit: Payer: Self-pay | Admitting: Internal Medicine

## 2019-06-12 DIAGNOSIS — E119 Type 2 diabetes mellitus without complications: Secondary | ICD-10-CM

## 2019-06-22 ENCOUNTER — Other Ambulatory Visit: Payer: Self-pay | Admitting: Internal Medicine

## 2019-06-22 DIAGNOSIS — Z794 Long term (current) use of insulin: Secondary | ICD-10-CM

## 2019-06-22 DIAGNOSIS — E1165 Type 2 diabetes mellitus with hyperglycemia: Secondary | ICD-10-CM

## 2019-09-07 ENCOUNTER — Other Ambulatory Visit: Payer: Self-pay

## 2019-09-11 ENCOUNTER — Encounter: Payer: Self-pay | Admitting: Internal Medicine

## 2019-09-11 ENCOUNTER — Ambulatory Visit (INDEPENDENT_AMBULATORY_CARE_PROVIDER_SITE_OTHER): Payer: 59 | Admitting: Internal Medicine

## 2019-09-11 DIAGNOSIS — Z5329 Procedure and treatment not carried out because of patient's decision for other reasons: Secondary | ICD-10-CM

## 2019-09-11 NOTE — Progress Notes (Signed)
Subjective:     Patient ID: Raymond Pugh, male   DOB: [birth year], 59 y.o.   MRN: [id number]  NO SHOW (canceled ~1 hour before appointment).  Diabetes  Raymond Pugh is a 60 y.o. man,returning for f/u for management of DM2, dx 2008, uncontrolled, insulin-dependent, with complications (peripheral neuropathy). Last visit 3 months ago, but previous visit 1 year and 2 months prior.  He is not usually compliant with appointments and insulin doses.  2 months before last visit, he stopped Lantus completely.  We restarted that last visit  Reviewed HbA1c levels: Lab Results  Component Value Date   HGBA1C 9.3 (A) 05/29/2019   HGBA1C 9.5 (H) 11/08/2018   HGBA1C 10.4 (A) 02/21/2018   At last visit was on: - Metformin 1000 mg 2x a day - Ozempic 0.5 mg weekly -  >> stopped 2 mo prior! - R insulin - take 30 minutes before meals: 14-20 units before "most" meals  Previously on Glipizide. He has hypoglycemia awareness at <90.  I advised him to use the following regimen: - Metformin 1000 mg 2x a day - Ozempic 0.5 mg weekly - R insulin 30 minutes before meals: 14-20 units before meals - Lantus 25 units in the a.m.  He checks his sugars 2-3 times a day: - a.m.: 305-319 >> 93, 125-270 >> 135, 150-190 - 2h after b'fast: 198-465 >> 264, 270 >> 135-150 - before lunch: 231-471 >> 217-315 >> 200s - 2h after lunch:124, 276-360 >> 315, 323 >> n/c - before dinne/r: 109, 124, 298, 349 >> 190-200 - after dinner: 161-405 >> 94, 134-466 >> 200-300 - bedtime: 303-429 >> 187-366, 381 >> n/c Highest: 598 >> n/c >> 400 (Christmas) Lowest:50s at night - given too much R insulin He had sugars of 600s in 12/2015 as he felt low and started to drink juice without checking his sugars!   -No CKD. Last BUN/Cr: Lab Results  Component Value Date   BUN 12 11/08/2018   Lab Results  Component Value Date   CREATININE 0.81 11/08/2018  On rapid. -+ HL; last lipids: Lab Results  Component Value Date   CHOL 141  11/08/2018   HDL 43 11/08/2018   LDLCALC 66 11/08/2018   TRIG 159 (H) 11/08/2018   CHOLHDL 3.3 11/08/2018  On lovastatin. - Last eye exam: 01/2017: Reportedly no DR.  [facility]. He has a history of retinal detachment surgery.  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   Review of Systems Constitutional: no weight gain/no weight loss, no fatigue, no subjective hyperthermia, no subjective hypothermia Eyes: no blurry vision, no xerophthalmia ENT: no sore throat, no nodules palpated in neck, no dysphagia, no odynophagia, no hoarseness Cardiovascular: no CP/no SOB/no palpitations/no leg swelling Respiratory: no cough/no SOB/no wheezing Gastrointestinal: no N/no V/no D/no C/no acid reflux Musculoskeletal: no muscle aches/no joint aches Skin: no rashes, no hair loss Neurological: no tremors/no numbness/no tingling/no dizziness  I reviewed pt's medications, allergies, PMH, social hx, family hx, and changes were documented in the history of present illness. Otherwise, unchanged from my initial visit note.  Past Medical History:  Diagnosis Date  . ADHD (attention deficit hyperactivity disorder)   . Anemia   . Barrett's esophagus   . Diabetes mellitus   . Hiatal hernia   . Hyperlipemia   . Hypertension   . Myocarditis ([place])    h/o mypocarditis, caused cardiomyopathy-- resolved    Past Surgical History:  Procedure Laterality Date  . ABSCESS  DRAIN LIVER PERC ([place] [facility]X)     klebsiella on liver pa states   . EYE SURGERY    . HYDROCELE EXCISION / REPAIR    . radiokeratotomy    . RETINAL DETACHMENT SURGERY     Social History   Socioeconomic History  . Marital status: Married    Spouse name: Not on file  . Number of children: 2  . Years of education: Not on file  . Highest education level: Not on file  Occupational History  . Occupation: Ship broker, part time job    Comment: Market researcher, Architect  Tobacco Use  . Smoking status: Never Smoker  . Smokeless  tobacco: Never Used  Substance and Sexual Activity  . Alcohol use: Yes    Alcohol/week: 1.0 standard drinks    Types: 1 Cans of beer per week    Comment: socially   . Drug use: No  . Sexual activity: Not on file    Comment: not asked  Other Topics Concern  . Not on file  Social History Narrative   2 children + 2 step children ---    Has a part time job, Ship broker    Exercise swimming   Social Determinants of Health   Financial Resource Strain:   . Difficulty of Paying Living Expenses:   Food Insecurity:   . Worried About Charity fundraiser in the Last Year:   . Arboriculturist in the Last Year:   Transportation Needs:   . Film/video editor (Medical):   Marland Kitchen Lack of Transportation (Non-Medical):   Physical Activity:   . Days of Exercise per Week:   . Minutes of Exercise per Session:   Stress:   . Feeling of Stress :   Social Connections:   . Frequency of Communication with Friends and Family:   . Frequency of Social Gatherings with Friends and Family:   . Attends Religious Services:   . Active Member of Clubs or Organizations:   . Attends Archivist Meetings:   Marland Kitchen Marital Status:   Intimate Partner Violence:   . Fear of Current or Ex-Partner:   . Emotionally Abused:   Marland Kitchen Physically Abused:   . Sexually Abused:    Current Outpatient Medications on File Prior to Visit  Medication Sig Dispense Refill  . ALPRAZolam (XANAX) 0.5 MG tablet Take 1 tablet (0.5 mg total) by mouth 2 (two) times daily as needed for anxiety. 20 tablet 0  . amphetamine-dextroamphetamine (ADDERALL XR) 20 MG 24 hr capsule Take 1 Capsules by mouth every day prn 30 capsule 0  . BD PEN NEEDLE NANO U/F 32G X 4 MM MISC Use 4 times daily NO REFILLS WITHOUT APPOINTMENT 400 each 0  . Blood Glucose Monitoring Suppl (ONETOUCH VERIO) w/Device KIT Use to check blood sugar 3 times a day. 1 kit 0  . glucose blood test strip Use to check blood sugar 3 times a day 300 each 12  . hydrochlorothiazide  (HYDRODIURIL) 25 MG tablet TAKE 1 TABLET(25 MG) BY MOUTH DAILY 90 tablet 1  . Insulin Glargine (LANTUS SOLOSTAR) 100 UNIT/ML Solostar Pen Inject 25 Units into the skin daily. 15 pen 5  . insulin regular (NOVOLIN R RELION) 100 units/mL injection Inject 0.16-0.2 mLs (16-20 Units total) into the skin 3 (three) times daily before meals. NO FURTHER REFILLS WITHOUT APPOINTMENT 20 mL 0  . Insulin Syringe-Needle U-100 (RELION INSULIN SYRINGE) 31G X 15/64" 0.5 ML MISC Use to inject insulin 3 times a day. NO REFILLS  WITHOUT APPOINTMENT 300 each 0  . Lancet Devices (ACCU-CHEK SOFTCLIX) lancets Use as instructed 1 each 0  . Lancets (ONETOUCH ULTRASOFT) lancets Use to check blood sugar 3 times a day 300 each 12  . lovastatin (MEVACOR) 20 MG tablet Take 1 tablet (20 mg total) by mouth at bedtime. 90 tablet 1  . magnesium chloride (SLOW-MAG) 64 MG TBEC SR tablet Take 1 tablet (64 mg total) by mouth daily. 30 tablet 1  . metFORMIN (GLUCOPHAGE) 1000 MG tablet Take 1 tablet twice a day. 180 tablet 3  . metoprolol tartrate (LOPRESSOR) 100 MG tablet TAKE 1 TABLET(100 MG) BY MOUTH TWICE DAILY 180 tablet 1  . omeprazole (PRILOSEC) 20 MG capsule Take 1 capsule (20 mg total) by mouth daily. 90 capsule 1  . ONETOUCH DELICA LANCETS FINE MISC Use to test blood sugar 4-6 times daily as instructed. Dx: E11.51 200 each 3  . OZEMPIC, 0.25 OR 0.5 MG/DOSE, 2 MG/1.5ML SOPN INJECT 0.5MG INTO THE SKIN ONCE A WEEK. NEED APPOINTMENT 3 pen 0  . ramipril (ALTACE) 10 MG capsule TAKE 1 CAPSULE(10 MG) BY MOUTH TWICE DAILY 180 capsule 1   No current facility-administered medications on file prior to visit.   No Known Allergies Family History  Problem Relation Age of Onset  . Diabetes Father   . Hypertension Father   . Colon polyps Father   . Hypertension Mother   . Asthma Mother   . Prostate cancer Neg Hx      Objective:   Physical Exam There were no vitals taken for this visit. There is no height or weight on file to calculate  BMI. Wt Readings from Last 3 Encounters:  06/01/19 209 lb (94.8 kg)  05/29/19 206 lb (93.4 kg)  10/09/18 210 lb (95.3 kg)   Constitutional: overweight, in NAD Eyes: PERRLA, EOMI, no exophthalmos ENT: moist mucous membranes, no thyromegaly, no cervical lymphadenopathy Cardiovascular: RRR, No MRG Respiratory: CTA B Gastrointestinal: abdomen soft, NT, ND, BS+ Musculoskeletal: no deformities, strength intact in all 4 Skin: moist, warm, no rashes Neurological: no tremor with outstretched hands, DTR normal in all 4  Assessment:     1.  DM2, uncontrolled, insulin-dependent, with complications - peripheral neuropathy - stable  2. HL  3.  Overweight  Plan:     1. Pt with longstanding, uncontrolled, type 2 diabetes, with noncompliance with visits and insulin doses.  At last visit, he returned after an absence of more than a year.  We did add Ozempic at the previous visit and he was able to lose a significant amount of weight.  However, sugars were still high and HbA1c was 9.3%, slightly lower, but still above target.  However, at that time, she was off Lantus, which she stopped 2 months prior to the appointment.  Since sugars were high, we restarted Lantus at a low dose and advised him to take it in the morning states he had to these menses when he had low blood sugars during the night.  We discussed about increasing the dose as needed, gradually.  - I advised him to: Patient Instructions  Please continue: - Metformin 1000 mg 2x a day - Ozempic 0.5 mg weekly - R insulin 30 minutes before meals: 14-20 units before meals - Lantus 25 units in the a.m.  Please return in 3-4 months with your sugar log.   - we checked his HbA1c: 7%  - advised to check sugars at different times of the day - 3x a day, rotating check times -  advised for yearly eye exams >> he is UTD - return to clinic in 3-4 months    2. HL -Reviewed latest lipid panel from 10/2018: At goal except for mild increase in  triglycerides Lab Results  Component Value Date   CHOL 141 11/08/2018   HDL 43 11/08/2018   LDLCALC 66 11/08/2018   TRIG 159 (H) 11/08/2018   CHOLHDL 3.3 11/08/2018  -Continues lovastatin without side effects  3.  Overweight -Continue Ozempic which should also help with weight loss -he lost 22 pounds after starting Ozempic, before last visit  [name], MD PhD [facility] Endocrinology

## 2019-09-20 ENCOUNTER — Other Ambulatory Visit: Payer: Self-pay | Admitting: Family Medicine

## 2019-09-20 DIAGNOSIS — I10 Essential (primary) hypertension: Secondary | ICD-10-CM

## 2019-09-20 NOTE — Telephone Encounter (Signed)
Requested Prescriptions  Pending Prescriptions Disp Refills  . ramipril (ALTACE) 10 MG capsule [Pharmacy Med Name: RAMIPRIL 10MG  CAPSULES] 180 capsule 1    Sig: TAKE 1 CAPSULE(10 MG) BY MOUTH TWICE DAILY     Cardiovascular:  ACE Inhibitors Failed - 09/20/2019  5:10 PM      Failed - Cr in normal range and within 180 days    Creat  Date Value Ref Range Status  04/08/2016 0.66 (L) 0.70 - 1.33 mg/dL Final    Comment:      For patients > or = 59 years of age: The upper reference limit for Creatinine is approximately 13% higher for people identified as African-American.      Creatinine, Ser  Date Value Ref Range Status  11/08/2018 0.81 0.76 - 1.27 mg/dL Final   Creatinine,U  Date Value Ref Range Status  07/24/2009 293.3 mg/dL Final         Failed - K in normal range and within 180 days    Potassium  Date Value Ref Range Status  11/08/2018 3.6 3.5 - 5.2 mmol/L Final         Failed - Last BP in normal range    BP Readings from Last 1 Encounters:  06/01/19 (!) 147/78         Passed - Patient is not pregnant      Passed - Valid encounter within last 6 months    Recent Outpatient Visits          3 months ago Gastroesophageal reflux disease with esophagitis, unspecified whether hemorrhage   [facility], Ranell Patrick, MD   10 months ago Essential hypertension   [facility], Ranell Patrick, MD   11 months ago Hypomagnesemia   [facility], Ranell Patrick, MD   1 year ago Type 2 diabetes mellitus with hyperglycemia, with long-term current use of insulin [facility])   [facility], Ranell Patrick, MD   1 year ago AKI (acute kidney injury) [facility])   [facility], Ranell Patrick, MD

## 2019-09-21 ENCOUNTER — Other Ambulatory Visit: Payer: Self-pay | Admitting: Internal Medicine

## 2019-09-21 DIAGNOSIS — Z794 Long term (current) use of insulin: Secondary | ICD-10-CM

## 2019-09-21 DIAGNOSIS — E1165 Type 2 diabetes mellitus with hyperglycemia: Secondary | ICD-10-CM

## 2019-10-05 ENCOUNTER — Ambulatory Visit: Payer: 59 | Admitting: Internal Medicine

## 2019-10-05 DIAGNOSIS — Z0289 Encounter for other administrative examinations: Secondary | ICD-10-CM

## 2019-12-16 ENCOUNTER — Other Ambulatory Visit: Payer: Self-pay | Admitting: Family Medicine

## 2019-12-16 DIAGNOSIS — I10 Essential (primary) hypertension: Secondary | ICD-10-CM

## 2019-12-16 DIAGNOSIS — E785 Hyperlipidemia, unspecified: Secondary | ICD-10-CM

## 2019-12-16 NOTE — Telephone Encounter (Signed)
Requested medication (s) are due for refill today: yes  Requested medication (s) are on the active medication list: yes  Last refill:  06/01/19 for all requested meds  Future visit scheduled: no  Notes to clinic:  needs appt and overdue labs  Requested Prescriptions  Pending Prescriptions Disp Refills   lovastatin (MEVACOR) 20 MG tablet [Pharmacy Med Name: LOVASTATIN 20MG  TABLETS] 90 tablet 1    Sig: TAKE 1 TABLET(20 MG) BY MOUTH AT BEDTIME      Cardiovascular:  Antilipid - Statins Failed - 12/16/2019 11:21 AM      Failed - Total Cholesterol in normal range and within 360 days    Cholesterol, Total  Date Value Ref Range Status  11/08/2018 141 100 - 199 mg/dL Final          Failed - LDL in normal range and within 360 days    LDL Calculated  Date Value Ref Range Status  11/08/2018 66 0 - 99 mg/dL Final          Failed - HDL in normal range and within 360 days    HDL  Date Value Ref Range Status  11/08/2018 43 >39 mg/dL Final          Failed - Triglycerides in normal range and within 360 days    Triglycerides  Date Value Ref Range Status  11/08/2018 159 (H) 0 - 149 mg/dL Final   Triglyceride fasting, serum  Date Value Ref Range Status  05/09/2006 136 0 - 149 mg/dL Final    Comment:    See lab report for associated comment(s)          Passed - Patient is not pregnant      Passed - Valid encounter within last 12 months    Recent Outpatient Visits           6 months ago Gastroesophageal reflux disease with esophagitis, unspecified whether hemorrhage   [facility] Ranell Patrick, MD   1 year ago Essential hypertension   [facility], Ranell Patrick, MD   1 year ago Hypomagnesemia   [facility], Ranell Patrick, MD   1 year ago Type 2 diabetes mellitus with hyperglycemia, with long-term current use of insulin [facility])   [facility], Ranell Patrick, MD   1 year ago AKI (acute kidney injury) [facility])   [facility], Ranell Patrick, MD                hydrochlorothiazide (HYDRODIURIL) 25 MG tablet [Pharmacy Med Name: HYDROCHLOROTHIAZIDE 25MG  TABLETS] 90 tablet 1    Sig: TAKE 1 TABLET(25 MG) BY MOUTH DAILY      Cardiovascular: Diuretics - Thiazide Failed - 12/16/2019 11:21 AM      Failed - Ca in normal range and within 360 days    Calcium  Date Value Ref Range Status  11/08/2018 8.9 8.7 - 10.2 mg/dL Final          Failed - Cr in normal range and within 360 days    Creat  Date Value Ref Range Status  04/08/2016 0.66 (L) 0.70 - 1.33 mg/dL Final    Comment:      For patients > or = 59 years of age: The upper reference limit for Creatinine is approximately 13% higher for people identified as African-American.      Creatinine, Ser  Date Value Ref Range Status  11/08/2018 0.81 0.76 - 1.27 mg/dL Final  Creatinine,U  Date Value Ref Range Status  07/24/2009 293.3 mg/dL Final          Failed - K in normal range and within 360 days    Potassium  Date Value Ref Range Status  11/08/2018 3.6 3.5 - 5.2 mmol/L Final          Failed - Na in normal range and within 360 days    Sodium  Date Value Ref Range Status  11/08/2018 138 134 - 144 mmol/L Final          Failed - Last BP in normal range    BP Readings from Last 1 Encounters:  06/01/19 (!) 147/78          Failed - Valid encounter within last 6 months    Recent Outpatient Visits           6 months ago Gastroesophageal reflux disease with esophagitis, unspecified whether hemorrhage   [facility], Ranell Patrick, MD   1 year ago Essential hypertension   [facility], Ranell Patrick, MD   1 year ago Hypomagnesemia   [facility], Ranell Patrick, MD   1 year ago Type 2 diabetes mellitus with hyperglycemia, with long-term current use of insulin [facility])   [facility], Ranell Patrick, MD   1 year ago AKI (acute kidney injury) [facility])   [facility], Ranell Patrick, MD                metoprolol tartrate (LOPRESSOR) 100 MG tablet [Pharmacy Med Name: METOPROLOL TARTRATE 100MG  TABLETS] 180 tablet 1    Sig: TAKE 1 TABLET(100 MG) BY MOUTH TWICE DAILY      Cardiovascular:  Beta Blockers Failed - 12/16/2019 11:21 AM      Failed - Last BP in normal range    BP Readings from Last 1 Encounters:  06/01/19 (!) 147/78          Failed - Valid encounter within last 6 months    Recent Outpatient Visits           6 months ago Gastroesophageal reflux disease with esophagitis, unspecified whether hemorrhage   [facility], Ranell Patrick, MD   1 year ago Essential hypertension   [facility], Ranell Patrick, MD   1 year ago Hypomagnesemia   [facility], Ranell Patrick, MD   1 year ago Type 2 diabetes mellitus with hyperglycemia, with long-term current use of insulin [facility])   [facility], Ranell Patrick, MD   1 year ago AKI (acute kidney injury) [facility])   [facility], Ranell Patrick, MD              Passed - Last Heart Rate in normal range    Pulse Readings from Last 1 Encounters:  06/01/19 87

## 2019-12-17 NOTE — Telephone Encounter (Signed)
Called pt LVMTCB on 7/226/21 @ 2:20 Pm

## 2019-12-17 NOTE — Telephone Encounter (Signed)
Please schedule appt for f/u and labs before any further refills

## 2019-12-28 ENCOUNTER — Other Ambulatory Visit: Payer: Self-pay | Admitting: Family Medicine

## 2019-12-28 DIAGNOSIS — K2100 Gastro-esophageal reflux disease with esophagitis, without bleeding: Secondary | ICD-10-CM

## 2019-12-28 MED ORDER — OMEPRAZOLE 20 MG PO CPDR: 20.0000 mg | DELAYED_RELEASE_CAPSULE | Freq: Every day | ORAL | 0 refills | Status: DC

## 2019-12-28 NOTE — Telephone Encounter (Signed)
Medication: hydrochlorothiazide (HYDRODIURIL) 25 MG tablet [[id number]] , omeprazole (PRILOSEC) 20 MG capsule [[id number]] , lovastatin (MEVACOR) 20 MG tablet [[id number]] ,   Has the patient contacted their pharmacy? YES (Agent: If no, request that the patient contact the pharmacy for the refill.) (Agent: If yes, when and what did the pharmacy advise?)  Preferred Pharmacy (with phone number or street name): [id number] [address], [place] [address]  Phone:  [phone] Fax:  [phone]     Agent: Please be advised that RX refills may take up to 3 business days. We ask that you follow-up with your pharmacy.

## 2019-12-28 NOTE — Telephone Encounter (Signed)
Requested medication (s) are due for refill today: yes  Requested medication (s) are on the active medication list: yes  Last refill:  06/01/19 #90 1 refill   Future visit scheduled: no  Notes to clinic:  meds overdue and patient needs appt. Sent 1230 pm Hydrochlorothiazide (hydrodiuril) 25mg   and lovastation (Mevacor) 20mg       Requested Prescriptions  Signed Prescriptions Disp Refills   omeprazole (PRILOSEC) 20 MG capsule 90 capsule 0    Sig: Take 1 capsule (20 mg total) by mouth daily.      Gastroenterology: Proton Pump Inhibitors Passed - 12/28/2019  2:40 PM      Passed - Valid encounter within last 12 months    Recent Outpatient Visits           7 months ago Gastroesophageal reflux disease with esophagitis, unspecified whether hemorrhage   [facility], Ranell Patrick, MD   1 year ago Essential hypertension   [facility], Ranell Patrick, MD   1 year ago Hypomagnesemia   [facility], Ranell Patrick, MD   1 year ago Type 2 diabetes mellitus with hyperglycemia, with long-term current use of insulin [facility])   [facility], Ranell Patrick, MD   1 year ago AKI (acute kidney injury) [facility])   [facility], Ranell Patrick, MD

## 2020-03-13 ENCOUNTER — Other Ambulatory Visit: Payer: Self-pay | Admitting: Family Medicine

## 2020-03-13 DIAGNOSIS — I10 Essential (primary) hypertension: Secondary | ICD-10-CM

## 2020-03-14 ENCOUNTER — Other Ambulatory Visit: Payer: Self-pay | Admitting: Family Medicine

## 2020-03-14 DIAGNOSIS — I10 Essential (primary) hypertension: Secondary | ICD-10-CM

## 2020-03-14 NOTE — Telephone Encounter (Signed)
Requested medication (s) are due for refill today: YES  Requested medication (s) are on the active medication list: YES   Future visit scheduled: YES  Notes to clinic: REVIEW FOR REFILL PATIENT IS DUE FOR A FOLLOW UP APPOINTMENT    Requested Prescriptions  Pending Prescriptions Disp Refills   metoprolol tartrate (LOPRESSOR) 100 MG tablet 180 tablet 1    Sig: TAKE 1 TABLET(100 MG) BY MOUTH TWICE DAILY      Cardiovascular:  Beta Blockers Failed - 03/14/2020  9:51 AM      Failed - Last BP in normal range    BP Readings from Last 1 Encounters:  06/01/19 (!) 147/78          Failed - Valid encounter within last 6 months    Recent Outpatient Visits           9 months ago Gastroesophageal reflux disease with esophagitis, unspecified whether hemorrhage   [facility], Ranell Patrick, MD   1 year ago Essential hypertension   [facility], Ranell Patrick, MD   1 year ago Hypomagnesemia   [facility], Ranell Patrick, MD   2 years ago Type 2 diabetes mellitus with hyperglycemia, with long-term current use of insulin [facility])   [facility], Ranell Patrick, MD   2 years ago AKI (acute kidney injury) [facility])   [facility], Ranell Patrick, MD              Passed - Last Heart Rate in normal range    Pulse Readings from Last 1 Encounters:  06/01/19 87

## 2020-03-14 NOTE — Telephone Encounter (Signed)
Medication Refill - Medication: metoprolol tartrate (LOPRESSOR) 100 MG tablet     Preferred Pharmacy (with phone number or street name): [id number] - [address], [address] AT [facility] [address] Phone:  [phone]  Fax:  [phone]       Agent: Please be advised that RX refills may take up to 3 business days. We ask that you follow-up with your pharmacy.

## 2020-03-17 MED ORDER — METOPROLOL TARTRATE 100 MG PO TABS: ORAL_TABLET | ORAL | 0 refills | Status: DC

## 2020-03-23 ENCOUNTER — Other Ambulatory Visit: Payer: Self-pay | Admitting: Internal Medicine

## 2020-03-23 DIAGNOSIS — Z794 Long term (current) use of insulin: Secondary | ICD-10-CM

## 2020-03-23 DIAGNOSIS — E1165 Type 2 diabetes mellitus with hyperglycemia: Secondary | ICD-10-CM

## 2020-03-30 ENCOUNTER — Other Ambulatory Visit: Payer: Self-pay | Admitting: Family Medicine

## 2020-03-30 DIAGNOSIS — I10 Essential (primary) hypertension: Secondary | ICD-10-CM

## 2020-04-03 ENCOUNTER — Other Ambulatory Visit: Payer: Self-pay | Admitting: Family Medicine

## 2020-04-03 DIAGNOSIS — K2100 Gastro-esophageal reflux disease with esophagitis, without bleeding: Secondary | ICD-10-CM

## 2020-04-03 NOTE — Telephone Encounter (Signed)
Requested medications are due for refill today yes  Requested medications are on the active medication list yes  Last refill 8/6  Last visit 03/2020  Future visit scheduled No  Notes to clinic You already approved 90 days with note to make appt, no upcoming appt is scheduled, please assess.

## 2020-04-05 ENCOUNTER — Other Ambulatory Visit: Payer: Self-pay | Admitting: Family Medicine

## 2020-04-05 DIAGNOSIS — I10 Essential (primary) hypertension: Secondary | ICD-10-CM

## 2020-04-05 NOTE — Telephone Encounter (Signed)
Requested medication (s) are due for refill today: yes  Requested medication (s) are on the active medication list: yes  Last refill:  06/01/19  Future visit scheduled: no  Notes to clinic:  needs appt   Requested Prescriptions  Pending Prescriptions Disp Refills   hydrochlorothiazide (HYDRODIURIL) 25 MG tablet [Pharmacy Med Name: HYDROCHLOROTHIAZIDE 25MG  TABLETS] 90 tablet 1    Sig: TAKE 1 TABLET(25 MG) BY MOUTH DAILY      Cardiovascular: Diuretics - Thiazide Failed - 04/05/2020  1:43 PM      Failed - Ca in normal range and within 360 days    Calcium  Date Value Ref Range Status  11/08/2018 8.9 8.7 - 10.2 mg/dL Final          Failed - Cr in normal range and within 360 days    Creat  Date Value Ref Range Status  04/08/2016 0.66 (L) 0.70 - 1.33 mg/dL Final    Comment:      For patients > or = 59 years of age: The upper reference limit for Creatinine is approximately 13% higher for people identified as African-American.      Creatinine, Ser  Date Value Ref Range Status  11/08/2018 0.81 0.76 - 1.27 mg/dL Final   Creatinine,U  Date Value Ref Range Status  07/24/2009 293.3 mg/dL Final          Failed - K in normal range and within 360 days    Potassium  Date Value Ref Range Status  11/08/2018 3.6 3.5 - 5.2 mmol/L Final          Failed - Na in normal range and within 360 days    Sodium  Date Value Ref Range Status  11/08/2018 138 134 - 144 mmol/L Final          Failed - Last BP in normal range    BP Readings from Last 1 Encounters:  06/01/19 (!) 147/78          Failed - Valid encounter within last 6 months    Recent Outpatient Visits           10 months ago Gastroesophageal reflux disease with esophagitis, unspecified whether hemorrhage   [facility], Ranell Patrick, MD   1 year ago Essential hypertension   [facility], Ranell Patrick, MD   1 year ago Hypomagnesemia   [facility], Ranell Patrick, MD   2 years  ago Type 2 diabetes mellitus with hyperglycemia, with long-term current use of insulin [facility])   [facility], Ranell Patrick, MD   2 years ago AKI (acute kidney injury) [facility])   [facility], Ranell Patrick, MD

## 2020-06-08 ENCOUNTER — Other Ambulatory Visit: Payer: Self-pay | Admitting: Family Medicine

## 2020-06-08 DIAGNOSIS — I10 Essential (primary) hypertension: Secondary | ICD-10-CM

## 2020-06-08 NOTE — Telephone Encounter (Signed)
Requested medication (s) are due for refill today: yes  Requested medication (s) are on the active medication list: yes  Last refill:  03/13/2020  Future visit scheduled: no  Notes to clinic:  overdue for follow up appointment   Requested Prescriptions  Pending Prescriptions Disp Refills   ramipril (ALTACE) 10 MG capsule [Pharmacy Med Name: RAMIPRIL 10MG  CAPSULES] 180 capsule 1    Sig: TAKE 1 CAPSULE(10 MG) BY MOUTH TWICE DAILY      Cardiovascular:  ACE Inhibitors Failed - 06/08/2020  3:12 AM      Failed - Cr in normal range and within 180 days    Creat  Date Value Ref Range Status  04/08/2016 0.66 (L) 0.70 - 1.33 mg/dL Final    Comment:      For patients > or = 60 years of age: The upper reference limit for Creatinine is approximately 13% higher for people identified as African-American.      Creatinine, Ser  Date Value Ref Range Status  11/08/2018 0.81 0.76 - 1.27 mg/dL Final   Creatinine,U  Date Value Ref Range Status  07/24/2009 293.3 mg/dL Final          Failed - K in normal range and within 180 days    Potassium  Date Value Ref Range Status  11/08/2018 3.6 3.5 - 5.2 mmol/L Final          Failed - Last BP in normal range    BP Readings from Last 1 Encounters:  06/01/19 (!) 147/78          Failed - Valid encounter within last 6 months    Recent Outpatient Visits           1 year ago Gastroesophageal reflux disease with esophagitis, unspecified whether hemorrhage   [facility], Ranell Patrick, MD   1 year ago Essential hypertension   [facility], Ranell Patrick, MD   1 year ago Hypomagnesemia   [facility], Ranell Patrick, MD   2 years ago Type 2 diabetes mellitus with hyperglycemia, with long-term current use of insulin [facility])   [facility], Ranell Patrick, MD   2 years ago AKI (acute kidney injury) [facility])   [facility], Ranell Patrick, MD                Passed - Patient is not  pregnant

## 2020-06-08 NOTE — Telephone Encounter (Signed)
Requested medication (s) are due for refill today: no  Requested medication (s) are on the active medication list: yes  Last refill:  03/13/2020  Future visit scheduled: no  Notes to clinic: overdue for follow up appt    Requested Prescriptions  Pending Prescriptions Disp Refills   ramipril (ALTACE) 10 MG capsule [Pharmacy Med Name: RAMIPRIL 10MG  CAPSULES] 180 capsule 1    Sig: TAKE 1 CAPSULE(10 MG) BY MOUTH TWICE DAILY      Cardiovascular:  ACE Inhibitors Failed - 06/08/2020 10:05 AM      Failed - Cr in normal range and within 180 days    Creat  Date Value Ref Range Status  04/08/2016 0.66 (L) 0.70 - 1.33 mg/dL Final    Comment:      For patients > or = 60 years of age: The upper reference limit for Creatinine is approximately 13% higher for people identified as African-American.      Creatinine, Ser  Date Value Ref Range Status  11/08/2018 0.81 0.76 - 1.27 mg/dL Final   Creatinine,U  Date Value Ref Range Status  07/24/2009 293.3 mg/dL Final          Failed - K in normal range and within 180 days    Potassium  Date Value Ref Range Status  11/08/2018 3.6 3.5 - 5.2 mmol/L Final          Failed - Last BP in normal range    BP Readings from Last 1 Encounters:  06/01/19 (!) 147/78          Failed - Valid encounter within last 6 months    Recent Outpatient Visits           1 year ago Gastroesophageal reflux disease with esophagitis, unspecified whether hemorrhage   [facility], Ranell Patrick, MD   1 year ago Essential hypertension   [facility], Ranell Patrick, MD   1 year ago Hypomagnesemia   [facility], Ranell Patrick, MD   2 years ago Type 2 diabetes mellitus with hyperglycemia, with long-term current use of insulin [facility])   [facility], Ranell Patrick, MD   2 years ago AKI (acute kidney injury) [facility])   [facility], Ranell Patrick, MD                Passed - Patient is not  pregnant

## 2020-06-11 ENCOUNTER — Other Ambulatory Visit: Payer: Self-pay | Admitting: Family Medicine

## 2020-06-11 DIAGNOSIS — I10 Essential (primary) hypertension: Secondary | ICD-10-CM

## 2020-06-11 DIAGNOSIS — K2100 Gastro-esophageal reflux disease with esophagitis, without bleeding: Secondary | ICD-10-CM

## 2020-06-11 NOTE — Telephone Encounter (Signed)
Attempted to call patient to schedule appointment- left message to call office. Rx notes state patient needs appointment for RF

## 2020-06-16 ENCOUNTER — Other Ambulatory Visit: Payer: Self-pay | Admitting: Internal Medicine

## 2020-06-16 DIAGNOSIS — E1165 Type 2 diabetes mellitus with hyperglycemia: Secondary | ICD-10-CM

## 2020-06-16 DIAGNOSIS — Z794 Long term (current) use of insulin: Secondary | ICD-10-CM

## 2020-06-20 ENCOUNTER — Other Ambulatory Visit: Payer: Self-pay | Admitting: Internal Medicine

## 2020-07-03 ENCOUNTER — Other Ambulatory Visit: Payer: Self-pay | Admitting: Internal Medicine

## 2020-07-03 ENCOUNTER — Other Ambulatory Visit: Payer: Self-pay | Admitting: Family Medicine

## 2020-07-03 DIAGNOSIS — Z794 Long term (current) use of insulin: Secondary | ICD-10-CM

## 2020-07-03 DIAGNOSIS — I10 Essential (primary) hypertension: Secondary | ICD-10-CM

## 2020-07-03 NOTE — Telephone Encounter (Signed)
Please schedule appt for med refill. 30 day supply has been sent no further refills without office visit

## 2020-07-03 NOTE — Telephone Encounter (Signed)
Requested medication (s) are due for refill today - yes  Requested medication (s) are on the active medication list -yes  Future visit scheduled -no  Last refill: 04/19/20  Notes to clinic: Patient has not followed up with appointment- courtesy RF already given- sent for review of request  Requested Prescriptions  Pending Prescriptions Disp Refills   metoprolol tartrate (LOPRESSOR) 100 MG tablet [Pharmacy Med Name: METOPROLOL TARTRATE 100MG  TABLETS] 180 tablet     Sig: TAKE 1 TABLET(100 MG) BY MOUTH TWICE DAILY      Cardiovascular:  Beta Blockers Failed - 07/03/2020 11:07 AM      Failed - Last BP in normal range    BP Readings from Last 1 Encounters:  06/01/19 (!) 147/78          Failed - Valid encounter within last 6 months    Recent Outpatient Visits           1 year ago Gastroesophageal reflux disease with esophagitis, unspecified whether hemorrhage   [facility], Ranell Patrick, MD   1 year ago Essential hypertension   [facility], Ranell Patrick, MD   1 year ago Hypomagnesemia   [facility], Ranell Patrick, MD   2 years ago Type 2 diabetes mellitus with hyperglycemia, with long-term current use of insulin [facility])   [facility], Ranell Patrick, MD   2 years ago AKI (acute kidney injury) [facility])   [facility], Ranell Patrick, MD                Passed - Last Heart Rate in normal range    Pulse Readings from Last 1 Encounters:  06/01/19 87              Requested Prescriptions  Pending Prescriptions Disp Refills   metoprolol tartrate (LOPRESSOR) 100 MG tablet [Pharmacy Med Name: METOPROLOL TARTRATE 100MG  TABLETS] 180 tablet     Sig: TAKE 1 TABLET(100 MG) BY MOUTH TWICE DAILY      Cardiovascular:  Beta Blockers Failed - 07/03/2020 11:07 AM      Failed - Last BP in normal range    BP Readings from Last 1 Encounters:  06/01/19 (!) 147/78          Failed - Valid encounter within last 6 months     Recent Outpatient Visits           1 year ago Gastroesophageal reflux disease with esophagitis, unspecified whether hemorrhage   [facility], Ranell Patrick, MD   1 year ago Essential hypertension   [facility], Ranell Patrick, MD   1 year ago Hypomagnesemia   [facility], Ranell Patrick, MD   2 years ago Type 2 diabetes mellitus with hyperglycemia, with long-term current use of insulin [facility])   [facility], Ranell Patrick, MD   2 years ago AKI (acute kidney injury) [facility])   [facility], Ranell Patrick, MD                Passed - Last Heart Rate in normal range    Pulse Readings from Last 1 Encounters:  06/01/19 87

## 2020-07-03 NOTE — Telephone Encounter (Signed)
No further refills without office visit 

## 2020-07-04 NOTE — Telephone Encounter (Signed)
Pt. Called asking for 30 refills on "all of my meds" until he was able to be seen. pt. Was unable to specify which meds.

## 2020-07-04 NOTE — Telephone Encounter (Signed)
Left message on patient's voicemail to inform that courtesy refill of 30-day supply was sent to pharmacy yesterday. Advised patient to call office to schedule an in-office appointment to see provider about 1 month from now for additional refills.

## 2020-07-08 ENCOUNTER — Other Ambulatory Visit: Payer: Self-pay | Admitting: Family Medicine

## 2020-07-08 DIAGNOSIS — I10 Essential (primary) hypertension: Secondary | ICD-10-CM

## 2020-07-08 NOTE — Telephone Encounter (Signed)
Requested medications are due for refill today.  Yes  Requested medications are on the active medications list.  Yes  Last refill. 06/09/2020  Future visit scheduled.   yes  Notes to clinic.  Courtesy refill already given. Please advise.

## 2020-07-25 ENCOUNTER — Ambulatory Visit: Payer: 59 | Admitting: Family Medicine

## 2020-07-29 ENCOUNTER — Telehealth: Payer: Self-pay | Admitting: Internal Medicine

## 2020-07-29 DIAGNOSIS — E119 Type 2 diabetes mellitus without complications: Secondary | ICD-10-CM

## 2020-07-29 MED ORDER — ONETOUCH VERIO VI STRP: ORAL_STRIP | 0 refills | Status: DC

## 2020-07-29 MED ORDER — ONETOUCH VERIO W/DEVICE KIT: PACK | 0 refills | Status: DC

## 2020-07-29 MED ORDER — ONETOUCH ULTRASOFT LANCETS MISC: 0 refills | Status: AC

## 2020-07-29 NOTE — Telephone Encounter (Signed)
Rx sent to preferred pharmacy.

## 2020-07-29 NOTE — Telephone Encounter (Signed)
Patient's wife called re: Patient lost his meter and requests the following new RX asap:  MEDICATION: Blood Glucose Monitoring Suppl (ONETOUCH VERIO) w/Device KIT with all supplies   PHARMACY:   [id number] [address], [place] - [address] AT [address] Phone:  [phone]  Fax:  [phone]       HAS THE PATIENT CONTACTED [place]?  Yes  IS THIS A 90 DAY SUPPLY : Yes  IS PATIENT OUT OF MEDICATION: Yes  IF NOT; HOW MUCH IS LEFT: 0  LAST APPOINTMENT DATE: '@1' /09/2019  NEXT APPOINTMENT DATE:'@5' /07/2020  DO WE HAVE YOUR PERMISSION TO LEAVE A DETAILED MESSAGE?: Yes  OTHER COMMENTS:    **Let patient know to contact pharmacy at the end of the day to make sure medication is ready. **  ** Please notify patient to allow 48-72 hours to process**  **Encourage patient to contact the pharmacy for refills or they can request refills through [facility]**

## 2020-07-31 ENCOUNTER — Ambulatory Visit (INDEPENDENT_AMBULATORY_CARE_PROVIDER_SITE_OTHER): Payer: 59 | Admitting: Family Medicine

## 2020-07-31 ENCOUNTER — Other Ambulatory Visit: Payer: Self-pay

## 2020-07-31 ENCOUNTER — Encounter: Payer: Self-pay | Admitting: Family Medicine

## 2020-07-31 VITALS — BP 149/83 | HR 83 | Temp 97.8°F | Resp 16 | Ht 70.0 in | Wt 205.8 lb

## 2020-07-31 DIAGNOSIS — Z1211 Encounter for screening for malignant neoplasm of colon: Secondary | ICD-10-CM

## 2020-07-31 DIAGNOSIS — F418 Other specified anxiety disorders: Secondary | ICD-10-CM

## 2020-07-31 DIAGNOSIS — E1165 Type 2 diabetes mellitus with hyperglycemia: Secondary | ICD-10-CM

## 2020-07-31 DIAGNOSIS — E162 Hypoglycemia, unspecified: Secondary | ICD-10-CM

## 2020-07-31 DIAGNOSIS — I10 Essential (primary) hypertension: Secondary | ICD-10-CM

## 2020-07-31 DIAGNOSIS — K2100 Gastro-esophageal reflux disease with esophagitis, without bleeding: Secondary | ICD-10-CM

## 2020-07-31 DIAGNOSIS — E785 Hyperlipidemia, unspecified: Secondary | ICD-10-CM | POA: Diagnosis not present

## 2020-07-31 DIAGNOSIS — Z794 Long term (current) use of insulin: Secondary | ICD-10-CM

## 2020-07-31 MED ORDER — RAMIPRIL 10 MG PO CAPS: 10.0000 mg | ORAL_CAPSULE | Freq: Two times a day (BID) | ORAL | 1 refills | Status: DC

## 2020-07-31 MED ORDER — OMEPRAZOLE 20 MG PO CPDR: 20.0000 mg | DELAYED_RELEASE_CAPSULE | Freq: Every day | ORAL | 1 refills | Status: DC

## 2020-07-31 MED ORDER — LOVASTATIN 20 MG PO TABS: 20.0000 mg | ORAL_TABLET | Freq: Every day | ORAL | 1 refills | Status: DC

## 2020-07-31 MED ORDER — METOPROLOL TARTRATE 100 MG PO TABS: 100.0000 mg | ORAL_TABLET | Freq: Two times a day (BID) | ORAL | 1 refills | Status: DC

## 2020-07-31 MED ORDER — ALPRAZOLAM 0.5 MG PO TABS: 0.5000 mg | ORAL_TABLET | Freq: Two times a day (BID) | ORAL | 0 refills | Status: DC | PRN

## 2020-07-31 MED ORDER — HYDROCHLOROTHIAZIDE 25 MG PO TABS: 25.0000 mg | ORAL_TABLET | Freq: Every day | ORAL | 1 refills | Status: DC

## 2020-07-31 NOTE — Patient Instructions (Addendum)
See information below on the low blood sugars but make sure you do not skip meals or do not take any extra insulin doses compared to what has been prescribed by your endocrinologist.  I will check your diabetes test today but you will need to follow-up with your endocrinologist to adjust diabetes medications.  If you have further low blood sugars be seen right away.  No med changes today.  Return to the clinic or go to the nearest emergency room if any of your symptoms worsen or new symptoms occur.   Hypoglycemia Hypoglycemia occurs when the level of sugar (glucose) in the blood is too low. Hypoglycemia can happen in people who have or do not have diabetes. It can develop quickly, and it can be a medical emergency. For most people, a blood glucose level below 70 mg/dL (3.9 mmol/L) is considered hypoglycemia. Glucose is a type of sugar that provides the body's main source of energy. Certain hormones (insulin and glucagon) control the level of glucose in the blood. Insulin lowers blood glucose, and glucagon raises blood glucose. Hypoglycemia can result from having too much insulin in the bloodstream, or from not eating enough food that contains glucose. You may also have reactive hypoglycemia, which happens within 4 hours after eating a meal. What are the causes? Hypoglycemia occurs most often in people who have diabetes and may be caused by:  Diabetes medicine.  Not eating enough, or not eating often enough.  Increased physical activity.  Drinking alcohol on an empty stomach. If you do not have diabetes, hypoglycemia may be caused by:  A tumor in the pancreas.  Not eating enough, or not eating for long periods at a time (fasting).  A severe infection or illness.  Problems after having bariatric surgery.  Organ failure, such as kidney or liver failure.  Certain medicines. What increases the risk? Hypoglycemia is more likely to develop in people who:  Have diabetes and take  medicines to lower blood glucose.  Abuse alcohol.  Have a severe illness. What are the signs or symptoms? Symptoms vary depending on whether the condition is mild, moderate, or severe. Mild hypoglycemia  Hunger.  Anxiety.  Sweating and feeling clammy.  Dizziness or feeling light-headed.  Sleepiness or restless sleep.  Nausea.  Increased heart rate.  Headache.  Blurry vision.  Irritability.  Tingling or numbness around the mouth, lips, or tongue.  A change in coordination. Moderate hypoglycemia  Confusion and poor judgment.  Behavior changes.  Weakness.  Irregular heartbeat. Severe hypoglycemia Severe hypoglycemia is a medical emergency. It can cause:  Fainting.  Seizures.  Loss of consciousness (coma).  Death. How is this diagnosed? Hypoglycemia is diagnosed with a blood test to measure your blood glucose level. This blood test is done while you are having symptoms. Your health care provider may also do a physical exam and review your medical history. How is this treated? This condition can be treated by immediately eating or drinking something that contains sugar with 15 grams of rapid-acting carbohydrate, such as:  4 oz (120 mL) of fruit juice.  4-6 oz (120-150 mL) of regular soda (not diet soda).  8 oz (240 mL) of low-fat milk.  Several pieces of hard candy. Check food labels to find out how many to eat for 15 grams.  1 Tbsp (15 mL) of sugar or honey. Treating hypoglycemia if you have diabetes If you are alert and able to swallow safely, follow the 15:15 rule:  Take 15 grams of a rapid-acting  carbohydrate. Talk with your health care provider about how much you should take. Options for getting 15 grams of rapid-acting carbohydrate include: ? Glucose tablets (take 4 tablets). ? Several pieces of hard candy. Check food labels to find out how many pieces to eat for 15 grams. ? 4 oz (120 mL) of fruit juice. ? 4-6 oz (120-150 mL) of regular (not  diet) soda. ? 1 Tbsp (15 mL) of honey or sugar.  Check your blood glucose 15 minutes after you take the carbohydrate.  If the repeat blood glucose level is still at or below 70 mg/dL (3.9 mmol/L), take 15 grams of a carbohydrate again.  If your blood glucose level does not increase above 70 mg/dL (3.9 mmol/L) after 3 tries, seek emergency medical care.  After your blood glucose level returns to normal, eat a meal or a snack within 1 hour.   Treating severe hypoglycemia Severe hypoglycemia is when your blood glucose level is at or below 54 mg/dL (3 mmol/L). Severe hypoglycemia is a medical emergency. Get medical help right away. If you have severe hypoglycemia and you cannot eat or drink, you will need to be given glucagon. A family member or close friend should learn how to check your blood glucose and how to give you glucagon. Ask your health care provider if you need to have an emergency glucagon kit available. Severe hypoglycemia may need to be treated in a hospital. The treatment may include getting glucose through an IV. You may also need treatment for the cause of your hypoglycemia. Follow these instructions at home: General instructions  Take over-the-counter and prescription medicines only as told by your health care provider.  Monitor your blood glucose as told by your health care provider.  If you drink alcohol: ? Limit how much you use to:  0-1 drink a day for nonpregnant women.  0-2 drinks a day for men. ? Be aware of how much alcohol is in your drink. In the [place] one drink equals one 12 oz bottle of beer (355 mL), one 5 oz glass of wine (148 mL), or one 1 oz glass of hard liquor (44 mL).  Keep all follow-up visits as told by your health care provider. This is important. If you have diabetes:  Always have a rapid-acting carbohydrate (15 grams) option with you to treat low blood glucose.  Follow your diabetes management plan as directed. Make sure you: ? Know the  symptoms of hypoglycemia. It is important to treat it right away to prevent it from becoming severe. ? Check your blood glucose as often as told. Always check before and after exercise. ? Always check your blood glucose before you drive a motorized vehicle. ? Take your medicines as told. ? Follow your meal plan. Eat on time, and do not skip meals.  Share your diabetes management plan with people in your workplace, school, and household.  Carry a medical alert card or wear medical alert jewelry.   Contact a health care provider if:  You have problems keeping your blood glucose in your target range.  You have frequent episodes of hypoglycemia. Get help right away if:  You continue to have hypoglycemia symptoms after eating or drinking something that contains 15 grams of fast-acting carbohydrate and you cannot get your blood glucose above 70 mg/dL (3.9 mmol/L) while following the 15:15 rule.  Your blood glucose is at or below 54 mg/dL (3 mmol/L).  You have a seizure.  You faint. These symptoms may represent a serious  problem that is an emergency. Do not wait to see if the symptoms will go away. Get medical help right away. Call your local emergency services (911 in the [place] Do not drive yourself to the hospital. Summary  Hypoglycemia occurs when the level of sugar (glucose) in the blood is too low.  Hypoglycemia can happen in people who have or do not have diabetes. It can develop quickly, and it can be a medical emergency.  Make sure you know the symptoms of hypoglycemia and how to treat it.  Always have a rapid-acting carbohydrate snack with you to treat low blood sugar. This information is not intended to replace advice given to you by your health care provider. Make sure you discuss any questions you have with your health care provider. Document Revised: 04/04/2019 Document Reviewed: 04/04/2019 Elsevier Patient Education  2021 [employer].      If you have lab work done  today you will be contacted with your lab results within the next 2 weeks.  If you have not heard from [place] then please contact us. The fastest way to get your results is to register for My Chart.   IF you received an x-ray today, you will receive an invoice from [facility]. Please contact [facility] at [phone] with questions or concerns regarding your invoice.   IF you received labwork today, you will receive an invoice from [facility]. Please contact [facility] at [phone] with questions or concerns regarding your invoice.   Our billing staff will not be able to assist you with questions regarding bills from these companies.  You will be contacted with the lab results as soon as they are available. The fastest way to get your results is to activate your My Chart account. Instructions are located on the last page of this paperwork. If you have not heard from [place] regarding the results in 2 weeks, please contact this office.

## 2020-07-31 NOTE — Progress Notes (Signed)
Subjective:  Patient ID: Raymond Pugh, male    DOB: 01-07-1961  Age: 60 y.o. MRN: [id number]  CC:  Chief Complaint  Patient presents with  . Gastroesophageal Reflux    Pt doing well on omeprazole needs refill  . Hypertension    Pt has been doing well is in need of metoprolol and HCTZ refills denies physical sxs   . Anxiety    Pt reports uses xanax on rare occasion to help ease some stress 20 tab last apx 1 year     HPI Raymond Pugh presents for Medication refills, last saw me in January 2021 for telemedicine visit.  GERD Well-controlled on otc omeprazole, history of Barrett's esophagus. No breakthrough symptoms.  Hypertension: Ramipril 10 mg BID, metoprolol 100 mg twice daily. hctz 96m qd.  No new side effects. No missed doses - temporarily ran out few weeks ago. Hesitant to present to medical practices d/t covid pandemic.  Home readings: 120-125/80 BP Readings from Last 3 Encounters:  07/31/20 (!) 149/83  06/01/19 (!) 147/78  05/29/19 140/86   Lab Results  Component Value Date   CREATININE 0.81 11/08/2018   History of diabetes followed by Dr.Gherghe previously, no-show for appointment April 2021. Will be following up with endocrinology. On lantus, ozempic, novolin R with meals. Had low with too much meds few times, or if not eating.  On ace-I, ARB.   Situational anxiety Rare use of alprazolam in the past.  Anxiety was stable on last discussed in January 2021.  Controlled substance database reviewed.  #20 alprazolam filled 06/01/2019. Only a few left.  Still rare use, less stressors with family that has moved out.   Hyperlipidemia: Lovastatin once per day. No new myalgias.  Lab Results  Component Value Date   CHOL 141 11/08/2018   HDL 43 11/08/2018   LDLCALC 66 11/08/2018   TRIG 159 (H) 11/08/2018   CHOLHDL 3.3 11/08/2018   Lab Results  Component Value Date   ALT 18 11/08/2018   AST 19 11/08/2018   ALKPHOS 73 11/08/2018   BILITOT 0.2 11/08/2018     HM: Colonoscopy normal in April 2012. Screening options with colonoscopy versus Cologuard discussed. Discussed timing of repeat testing intervals if normal, as well as potential need for diagnostic Colonoscopy if positive Cologuard. Understanding expressed, and chose Cologuard.     History Patient Active Problem List   Diagnosis Date Noted  . Overweight (BMI 25.0-29.9) 05/29/2019  . Liver abscess 06/24/2014  . Eyelid lesion 05/29/2012  . Obesity 08/25/2010  . BARRETTS ESOPHAGUS 07/03/2010  . Iron deficiency anemia, unspecified 11/16/2009  . Hyperlipidemia 11/04/2008  . ADHD 08/14/2007  . MOLE 03/13/2007  . Uncontrolled type 2 diabetes mellitus with peripheral angiopathy ([place] 03/13/2007  . Essential hypertension 11/03/2006   Past Medical History:  Diagnosis Date  . ADHD (attention deficit hyperactivity disorder)   . Anemia   . Barrett's esophagus   . Diabetes mellitus   . Hiatal hernia   . Hyperlipemia   . Hypertension   . Myocarditis ([place]    h/o mypocarditis, caused cardiomyopathy-- resolved    Past Surgical History:  Procedure Laterality Date  . ABSCESS DRAIN LIVER PERC (ABrayHX)     klebsiella on liver pa states   . EYE SURGERY    . HYDROCELE EXCISION / REPAIR    . radiokeratotomy    . RETINAL DETACHMENT SURGERY     No Known Allergies Prior to Admission medications   Medication Sig Start Date End Date Taking?  Authorizing Provider  ALPRAZolam Duanne Moron) 0.5 MG tablet Take 1 tablet (0.5 mg total) by mouth 2 (two) times daily as needed for anxiety. 06/01/19  Yes Wendie Agreste, MD  amphetamine-dextroamphetamine (ADDERALL XR) 20 MG 24 hr capsule Take 1 Capsules by mouth every day prn 11/24/15  Yes Daub, Loura Back, MD  BD PEN NEEDLE NANO U/F 32G X 4 MM MISC Use 4 times daily NO REFILLS WITHOUT APPOINTMENT 03/29/19  Yes [name], MD  Blood Glucose Monitoring Suppl (ONETOUCH VERIO) w/Device KIT Use to check blood sugar 3 times a day. 07/29/20  Yes [name], MD  glucose blood (ONETOUCH VERIO) test strip USE TO CHECK BLOOD SUGAR THREE TIMES DAILY 07/29/20  Yes [name], MD  hydrochlorothiazide (HYDRODIURIL) 25 MG tablet TAKE 1 TABLET(25 MG) BY MOUTH DAILY 04/07/20  Yes Wendie Agreste, MD  Insulin Glargine (LANTUS SOLOSTAR) 100 UNIT/ML Solostar Pen Inject 25 Units into the skin daily. 05/29/19  Yes [name], MD  insulin regular (NOVOLIN R RELION) 100 units/mL injection Inject 0.16-0.2 mLs (16-20 Units total) into the skin 3 (three) times daily before meals. NO FURTHER REFILLS WITHOUT APPOINTMENT 03/29/19  Yes [name], MD  Insulin Syringe-Needle U-100 (RELION INSULIN SYRINGE) 31G X 15/64" 0.5 ML MISC Use to inject insulin 3 times a day. NO REFILLS WITHOUT APPOINTMENT 03/29/19  Yes [name], MD  Lancet Devices [facility]) lancets Use as instructed 05/25/17  Yes [name], MD  Lancets [facility] ULTRASOFT) lancets Use to check blood sugar 3 times a day 07/29/20  Yes [name], MD  lovastatin (MEVACOR) 20 MG tablet Take 1 tablet (20 mg total) by mouth at bedtime. 06/01/19  Yes Wendie Agreste, MD  metFORMIN (GLUCOPHAGE) 1000 MG tablet TAKE 1 TABLET BY MOUTH TWICE DAILY. NO FURTHER REFILLS WITHOUT APPOINTMENT 07/04/20  Yes [name], MD  metoprolol tartrate (LOPRESSOR) 100 MG tablet TAKE 1 TABLET(100 MG) BY MOUTH TWICE DAILY 07/03/20  Yes Wendie Agreste, MD  omeprazole (PRILOSEC) 20 MG capsule Take 1 capsule (20 mg total) by mouth daily. 12/28/19  Yes Wendie Agreste, MD  OZEMPIC, 0.25 OR 0.5 MG/DOSE, 2 MG/1.5ML SOPN INJECT 0.5MG INTO THE SKIN ONCE A WEEK. NEED APPOINTMENT 03/24/20  Yes [name], MD  ramipril (ALTACE) 10 MG capsule TAKE 1 CAPSULE(10 MG) BY MOUTH TWICE DAILY 07/08/20  Yes Wendie Agreste, MD  magnesium chloride (SLOW-MAG) 64 MG TBEC SR tablet Take 1 tablet (64 mg total) by mouth daily. Patient not taking: Reported on 07/31/2020 01/30/18   Wendie Agreste, MD    Social History   Socioeconomic History  . Marital status: Married    Spouse name: Not on file  . Number of children: 2  . Years of education: Not on file  . Highest education level: Not on file  Occupational History  . Occupation: Ship broker, part time job    Comment: Market researcher, Architect  Tobacco Use  . Smoking status: Never Smoker  . Smokeless tobacco: Never Used  Substance and Sexual Activity  . Alcohol use: Yes    Alcohol/week: 1.0 standard drink    Types: 1 Cans of beer per week    Comment: socially   . Drug use: No  . Sexual activity: Not on file    Comment: not asked  Other Topics Concern  . Not on file  Social History Narrative   2 children + 2 step children ---    Has a part time job, Ship broker    Exercise swimming  Social Determinants of Health   Financial Resource Strain: Not on file  Food Insecurity: Not on file  Transportation Needs: Not on file  Physical Activity: Not on file  Stress: Not on file  Social Connections: Not on file  Intimate Partner Violence: Not on file    Review of Systems  Constitutional: Negative for fatigue and unexpected weight change.  Eyes: Negative for visual disturbance.  Respiratory: Negative for cough, chest tightness and shortness of breath.   Cardiovascular: Negative for chest pain, palpitations and leg swelling.  Gastrointestinal: Negative for abdominal pain and blood in stool.  Neurological: Negative for dizziness, light-headedness and headaches.     Objective:   Vitals:   07/31/20 1540  BP: (!) 149/83  Pulse: 83  Resp: 16  Temp: 97.8 F (36.6 C)  TempSrc: Temporal  SpO2: 99%  Weight: 205 lb 12.8 oz (93.4 kg)  Height: '5\' 10"'  (1.778 m)     Physical Exam Vitals reviewed.  Constitutional:      Appearance: He is well-developed.  HENT:     Head: Normocephalic and atraumatic.  Eyes:     Pupils: Pupils are equal, round, and reactive to light.  Neck:     Vascular: No carotid bruit or JVD.  Cardiovascular:      Rate and Rhythm: Normal rate and regular rhythm.     Heart sounds: Normal heart sounds. No murmur heard.   Pulmonary:     Effort: Pulmonary effort is normal.     Breath sounds: Normal breath sounds. No rales.  Skin:    General: Skin is warm and dry.  Neurological:     General: No focal deficit present.     Mental Status: He is alert and oriented to person, place, and time.  Psychiatric:        Mood and Affect: Mood normal.        Behavior: Behavior normal.        Thought Content: Thought content normal.      Assessment & Plan:  ORIAN FIGUEIRA is a 60 y.o. male . Type 2 diabetes mellitus with hyperglycemia, with long-term current use of insulin ([place]) - Plan: Hemoglobin A1c Hypoglycemia  -Check A1c but follow-up with endocrinology.  Hypoglycemia precautions and prevention discussed, handout given.  RTC/ER precautions given.  Situational anxiety - Plan: ALPRAZolam (XANAX) 0.5 MG tablet  -Rare use of alprazolam, refilled.  Essential hypertension - Plan: hydrochlorothiazide (HYDRODIURIL) 25 MG tablet, metoprolol tartrate (LOPRESSOR) 100 MG tablet, ramipril (ALTACE) 10 MG capsule  -Slight elevation in office, home monitoring with RTC precautions discussed.  Home monitoring normal.  Hyperlipidemia, unspecified hyperlipidemia type - Plan: lovastatin (MEVACOR) 20 MG tablet, Lipid panel, Comprehensive metabolic panel  -Tolerating statin, continue same, check labs  Gastroesophageal reflux disease with esophagitis, unspecified whether hemorrhage - Plan: omeprazole (PRILOSEC) 20 MG capsule  -Stable with current regimen, continue same  Screen for colon cancer - Plan: Cologuard   Meds ordered this encounter  Medications  . ALPRAZolam (XANAX) 0.5 MG tablet    Sig: Take 1 tablet (0.5 mg total) by mouth 2 (two) times daily as needed for anxiety.    Dispense:  20 tablet    Refill:  0  . hydrochlorothiazide (HYDRODIURIL) 25 MG tablet    Sig: Take 1 tablet (25 mg total) by mouth daily.     Dispense:  90 tablet    Refill:  1  . lovastatin (MEVACOR) 20 MG tablet    Sig: Take 1 tablet (20 mg total) by mouth  at bedtime.    Dispense:  90 tablet    Refill:  1  . metoprolol tartrate (LOPRESSOR) 100 MG tablet    Sig: Take 1 tablet (100 mg total) by mouth 2 (two) times daily.    Dispense:  180 tablet    Refill:  1  . omeprazole (PRILOSEC) 20 MG capsule    Sig: Take 1 capsule (20 mg total) by mouth daily.    Dispense:  90 capsule    Refill:  1  . ramipril (ALTACE) 10 MG capsule    Sig: Take 1 capsule (10 mg total) by mouth 2 (two) times daily.    Dispense:  180 capsule    Refill:  1   Patient Instructions    See information below on the low blood sugars but make sure you do not skip meals or do not take any extra insulin doses compared to what has been prescribed by your endocrinologist.  I will check your diabetes test today but you will need to follow-up with your endocrinologist to adjust diabetes medications.  If you have further low blood sugars be seen right away.  No med changes today.  Return to the clinic or go to the nearest emergency room if any of your symptoms worsen or new symptoms occur.   Hypoglycemia Hypoglycemia occurs when the level of sugar (glucose) in the blood is too low. Hypoglycemia can happen in people who have or do not have diabetes. It can develop quickly, and it can be a medical emergency. For most people, a blood glucose level below 70 mg/dL (3.9 mmol/L) is considered hypoglycemia. Glucose is a type of sugar that provides the body's main source of energy. Certain hormones (insulin and glucagon) control the level of glucose in the blood. Insulin lowers blood glucose, and glucagon raises blood glucose. Hypoglycemia can result from having too much insulin in the bloodstream, or from not eating enough food that contains glucose. You may also have reactive hypoglycemia, which happens within 4 hours after eating a meal. What are the  causes? Hypoglycemia occurs most often in people who have diabetes and may be caused by:  Diabetes medicine.  Not eating enough, or not eating often enough.  Increased physical activity.  Drinking alcohol on an empty stomach. If you do not have diabetes, hypoglycemia may be caused by:  A tumor in the pancreas.  Not eating enough, or not eating for long periods at a time (fasting).  A severe infection or illness.  Problems after having bariatric surgery.  Organ failure, such as kidney or liver failure.  Certain medicines. What increases the risk? Hypoglycemia is more likely to develop in people who:  Have diabetes and take medicines to lower blood glucose.  Abuse alcohol.  Have a severe illness. What are the signs or symptoms? Symptoms vary depending on whether the condition is mild, moderate, or severe. Mild hypoglycemia  Hunger.  Anxiety.  Sweating and feeling clammy.  Dizziness or feeling light-headed.  Sleepiness or restless sleep.  Nausea.  Increased heart rate.  Headache.  Blurry vision.  Irritability.  Tingling or numbness around the mouth, lips, or tongue.  A change in coordination. Moderate hypoglycemia  Confusion and poor judgment.  Behavior changes.  Weakness.  Irregular heartbeat. Severe hypoglycemia Severe hypoglycemia is a medical emergency. It can cause:  Fainting.  Seizures.  Loss of consciousness (coma).  Death. How is this diagnosed? Hypoglycemia is diagnosed with a blood test to measure your blood glucose level. This blood test is  done while you are having symptoms. Your health care provider may also do a physical exam and review your medical history. How is this treated? This condition can be treated by immediately eating or drinking something that contains sugar with 15 grams of rapid-acting carbohydrate, such as:  4 oz (120 mL) of fruit juice.  4-6 oz (120-150 mL) of regular soda (not diet soda).  8 oz (240 mL)  of low-fat milk.  Several pieces of hard candy. Check food labels to find out how many to eat for 15 grams.  1 Tbsp (15 mL) of sugar or honey. Treating hypoglycemia if you have diabetes If you are alert and able to swallow safely, follow the 15:15 rule:  Take 15 grams of a rapid-acting carbohydrate. Talk with your health care provider about how much you should take. Options for getting 15 grams of rapid-acting carbohydrate include: ? Glucose tablets (take 4 tablets). ? Several pieces of hard candy. Check food labels to find out how many pieces to eat for 15 grams. ? 4 oz (120 mL) of fruit juice. ? 4-6 oz (120-150 mL) of regular (not diet) soda. ? 1 Tbsp (15 mL) of honey or sugar.  Check your blood glucose 15 minutes after you take the carbohydrate.  If the repeat blood glucose level is still at or below 70 mg/dL (3.9 mmol/L), take 15 grams of a carbohydrate again.  If your blood glucose level does not increase above 70 mg/dL (3.9 mmol/L) after 3 tries, seek emergency medical care.  After your blood glucose level returns to normal, eat a meal or a snack within 1 hour.   Treating severe hypoglycemia Severe hypoglycemia is when your blood glucose level is at or below 54 mg/dL (3 mmol/L). Severe hypoglycemia is a medical emergency. Get medical help right away. If you have severe hypoglycemia and you cannot eat or drink, you will need to be given glucagon. A family member or close friend should learn how to check your blood glucose and how to give you glucagon. Ask your health care provider if you need to have an emergency glucagon kit available. Severe hypoglycemia may need to be treated in a hospital. The treatment may include getting glucose through an IV. You may also need treatment for the cause of your hypoglycemia. Follow these instructions at home: General instructions  Take over-the-counter and prescription medicines only as told by your health care provider.  Monitor your blood  glucose as told by your health care provider.  If you drink alcohol: ? Limit how much you use to:  0-1 drink a day for nonpregnant women.  0-2 drinks a day for men. ? Be aware of how much alcohol is in your drink. In the [place] one drink equals one 12 oz bottle of beer (355 mL), one 5 oz glass of wine (148 mL), or one 1 oz glass of hard liquor (44 mL).  Keep all follow-up visits as told by your health care provider. This is important. If you have diabetes:  Always have a rapid-acting carbohydrate (15 grams) option with you to treat low blood glucose.  Follow your diabetes management plan as directed. Make sure you: ? Know the symptoms of hypoglycemia. It is important to treat it right away to prevent it from becoming severe. ? Check your blood glucose as often as told. Always check before and after exercise. ? Always check your blood glucose before you drive a motorized vehicle. ? Take your medicines as told. ? Follow your meal  plan. Eat on time, and do not skip meals.  Share your diabetes management plan with people in your workplace, school, and household.  Carry a medical alert card or wear medical alert jewelry.   Contact a health care provider if:  You have problems keeping your blood glucose in your target range.  You have frequent episodes of hypoglycemia. Get help right away if:  You continue to have hypoglycemia symptoms after eating or drinking something that contains 15 grams of fast-acting carbohydrate and you cannot get your blood glucose above 70 mg/dL (3.9 mmol/L) while following the 15:15 rule.  Your blood glucose is at or below 54 mg/dL (3 mmol/L).  You have a seizure.  You faint. These symptoms may represent a serious problem that is an emergency. Do not wait to see if the symptoms will go away. Get medical help right away. Call your local emergency services (911 in the [place] Do not drive yourself to the hospital. Summary  Hypoglycemia occurs when the  level of sugar (glucose) in the blood is too low.  Hypoglycemia can happen in people who have or do not have diabetes. It can develop quickly, and it can be a medical emergency.  Make sure you know the symptoms of hypoglycemia and how to treat it.  Always have a rapid-acting carbohydrate snack with you to treat low blood sugar. This information is not intended to replace advice given to you by your health care provider. Make sure you discuss any questions you have with your health care provider. Document Revised: 04/04/2019 Document Reviewed: 04/04/2019 Elsevier Patient Education  2021 [employer].      If you have lab work done today you will be contacted with your lab results within the next 2 weeks.  If you have not heard from [place] then please contact us. The fastest way to get your results is to register for My Chart.   IF you received an x-ray today, you will receive an invoice from [facility]. Please contact [facility] at [phone] with questions or concerns regarding your invoice.   IF you received labwork today, you will receive an invoice from [facility]. Please contact [facility] at [phone] with questions or concerns regarding your invoice.   Our billing staff will not be able to assist you with questions regarding bills from these companies.  You will be contacted with the lab results as soon as they are available. The fastest way to get your results is to activate your My Chart account. Instructions are located on the last page of this paperwork. If you have not heard from [place] regarding the results in 2 weeks, please contact this office.         Signed, [name], MD [facility]

## 2020-08-01 LAB — COMPREHENSIVE METABOLIC PANEL
ALT: 19 IU/L (ref 0–44)
AST: 20 IU/L (ref 0–40)
Albumin/Globulin Ratio: 1.4 (ref 1.2–2.2)
Albumin: 4.1 g/dL (ref 3.8–4.9)
Alkaline Phosphatase: 97 IU/L (ref 44–121)
BUN/Creatinine Ratio: 18 (ref 9–20)
BUN: 13 mg/dL (ref 6–24)
Bilirubin Total: <0.2 mg/dL (ref 0.0–1.2)
CO2: 24 mmol/L (ref 20–29)
Calcium: 9.1 mg/dL (ref 8.7–10.2)
Chloride: 97 mmol/L (ref 96–106)
Creatinine, Ser: 0.72 mg/dL — ABNORMAL LOW (ref 0.76–1.27)
Globulin, Total: 2.9 g/dL (ref 1.5–4.5)
Glucose: 135 mg/dL — ABNORMAL HIGH (ref 65–99)
Potassium: 3.3 mmol/L — ABNORMAL LOW (ref 3.5–5.2)
Sodium: 141 mmol/L (ref 134–144)
Total Protein: 7 g/dL (ref 6.0–8.5)
eGFR: 105 mL/min/{1.73_m2} (ref >59)

## 2020-08-01 LAB — LIPID PANEL
Chol/HDL Ratio: 3.1 ratio (ref 0.0–5.0)
Cholesterol, Total: 144 mg/dL (ref 100–199)
HDL: 46 mg/dL (ref >39)
LDL Chol Calc (NIH): 67 mg/dL (ref 0–99)
Triglycerides: 186 mg/dL — ABNORMAL HIGH (ref 0–149)
VLDL Cholesterol Cal: 31 mg/dL (ref 5–40)

## 2020-08-01 LAB — HEMOGLOBIN A1C
Est. average glucose Bld gHb Est-mCnc: 223 mg/dL
Hgb A1c MFr Bld: 9.4 % — ABNORMAL HIGH (ref 4.8–5.6)

## 2020-08-10 ENCOUNTER — Other Ambulatory Visit: Payer: Self-pay | Admitting: Family Medicine

## 2020-08-10 DIAGNOSIS — E876 Hypokalemia: Secondary | ICD-10-CM

## 2020-08-10 NOTE — Progress Notes (Signed)
Bmp ordered for hypokalemia.

## 2020-08-14 LAB — COLOGUARD: Cologuard: NEGATIVE

## 2020-08-26 ENCOUNTER — Encounter: Payer: Self-pay | Admitting: Internal Medicine

## 2020-08-26 DIAGNOSIS — E1165 Type 2 diabetes mellitus with hyperglycemia: Secondary | ICD-10-CM

## 2020-08-26 DIAGNOSIS — Z794 Long term (current) use of insulin: Secondary | ICD-10-CM

## 2020-08-26 MED ORDER — OZEMPIC (0.25 OR 0.5 MG/DOSE) 2 MG/1.5ML ~~LOC~~ SOPN: PEN_INJECTOR | SUBCUTANEOUS | 0 refills | Status: DC

## 2020-09-21 ENCOUNTER — Other Ambulatory Visit: Payer: Self-pay | Admitting: Internal Medicine

## 2020-09-21 DIAGNOSIS — Z794 Long term (current) use of insulin: Secondary | ICD-10-CM

## 2020-09-21 DIAGNOSIS — E1165 Type 2 diabetes mellitus with hyperglycemia: Secondary | ICD-10-CM

## 2020-09-23 ENCOUNTER — Encounter: Payer: Self-pay | Admitting: Internal Medicine

## 2020-09-23 ENCOUNTER — Ambulatory Visit (INDEPENDENT_AMBULATORY_CARE_PROVIDER_SITE_OTHER): Payer: 59 | Admitting: Internal Medicine

## 2020-09-23 ENCOUNTER — Other Ambulatory Visit: Payer: Self-pay

## 2020-09-23 VITALS — BP 128/82 | HR 97 | Ht 70.0 in | Wt 203.8 lb

## 2020-09-23 DIAGNOSIS — Z794 Long term (current) use of insulin: Secondary | ICD-10-CM

## 2020-09-23 DIAGNOSIS — E785 Hyperlipidemia, unspecified: Secondary | ICD-10-CM

## 2020-09-23 DIAGNOSIS — E663 Overweight: Secondary | ICD-10-CM

## 2020-09-23 DIAGNOSIS — E1165 Type 2 diabetes mellitus with hyperglycemia: Secondary | ICD-10-CM

## 2020-09-23 LAB — POCT GLYCOSYLATED HEMOGLOBIN (HGB A1C): Hemoglobin A1C: 10.4 % — AB (ref 4.0–5.6)

## 2020-09-23 MED ORDER — OZEMPIC (1 MG/DOSE) 4 MG/3ML ~~LOC~~ SOPN: 1.0000 mg | PEN_INJECTOR | SUBCUTANEOUS | 3 refills | Status: DC

## 2020-09-23 NOTE — Progress Notes (Signed)
Subjective:     Patient ID: Raymond Pugh, male   DOB: [birth year], 60 y.o.   MRN: [id number]  Diabetes  Mr. Huss is a 60 y.o. man,returning for f/u for management of DM2, dx 2008, uncontrolled, insulin-dependent, with complications (peripheral neuropathy). Last visit 1 year and 4 months ago.  He no showed 2 appointments since last visit.  Previous visit was also more than a year prior.  He is not usually compliant with appointments and insulin doses.    Interim history: He returns after another long absence.  At last visit, he again returned after more than a year and he was off his Lantus. He had an ulcer in the left toe-saw podiatry- they are treating him for this.  It is considered superficial.  The big toe is bandaged today.  No pain. No increased urination, blurry vision, nausea.  Reviewed HbA1c levels: Lab Results  Component Value Date   HGBA1C 9.4 (H) 07/31/2020   HGBA1C 9.3 (A) 05/29/2019   HGBA1C 9.5 (H) 11/08/2018   He is now on: - Metformin 1000 mg 2x a day - Ozempic 0.5 mg weekly - Lantus 56-60 >> 25 >> but actually taking 10 units at dinnertime - misses many doses - R insulin - take 30 minutes before meals: 14-20 >> 16-20 units before "most" meals (2-3x a day)  Previously on Glipizide. He has hypoglycemia awareness at <90.  He checks his sugars 3-4x times a day - no log, no meter: - a.m.: 305-319 >> 93, 125-270 >> 135, 150-190 >> 190-250 - 2h after b'fast:  198-465 >> 264, 270 >> 135-150 >> n/c - before lunch:  231-471 >> 217-315 >> 200s >> no lunch (Pack of nabs): 150-170 (no insulin) - 2h after lunch: 124, 276-360 >> 315, 323 >> n/c - before dinne/r: 1 109, 124, 298, 349 >> 190-200 >> 200s - after dinner: 1161-405 >> 94, 134-466 >> 200-300 >> 200-300s - bedtime: see above >> 303-429 >> 187-366, 381 >> n/c Highest: 481 >> 598 >> n/c >> 400 (Christmas) >> 370 Lowest: 124 >> 93 >> 50s at night - given too much R insulin >> 30s (during the night; unclear why) He  had sugars of 600s in 12/2015 as he felt low and started to drink juice without checking his sugars!   -No CKD. Last BUN/Cr: Lab Results  Component Value Date   BUN 13 07/31/2020   Lab Results  Component Value Date   CREATININE 0.72 (L) 07/31/2020  On ramipril.  -+ HL; last lipids: Lab Results  Component Value Date   CHOL 144 07/31/2020   HDL 46 07/31/2020   LDLCALC 67 07/31/2020   TRIG 186 (H) 07/31/2020   CHOLHDL 3.1 07/31/2020  On lovastatin.  - Last eye exam: 01/2017: Reportedly no DR.  [facility]. He has a history of retinal detachment surgery.  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   Review of Systems Constitutional: no weight gain/no weight loss, no fatigue, no subjective hyperthermia, no subjective hypothermia Eyes: no blurry vision, no xerophthalmia ENT: no sore throat, no nodules palpated in neck, no dysphagia, no odynophagia, no hoarseness Cardiovascular: no CP/no SOB/no palpitations/no leg swelling Respiratory: no cough/no SOB/no wheezing Gastrointestinal: no N/no V/no D/no C/no acid reflux Musculoskeletal: no muscle aches/no joint aches Skin: no rashes, no hair loss Neurological: no tremors/no numbness/no tingling/no dizziness  I reviewed pt's medications, allergies, PMH, social hx, family hx, and changes were documented in the history  of present illness. Otherwise, unchanged from my initial visit note.  Past Medical History:  Diagnosis Date  . ADHD (attention deficit hyperactivity disorder)   . Anemia   . Barrett's esophagus   . Diabetes mellitus   . Hiatal hernia   . Hyperlipemia   . Hypertension   . Myocarditis ([place])    h/o mypocarditis, caused cardiomyopathy-- resolved    Past Surgical History:  Procedure Laterality Date  . ABSCESS DRAIN LIVER PERC ([place] HX)     klebsiella on liver pa states   . EYE SURGERY    . HYDROCELE EXCISION / REPAIR    . radiokeratotomy    . RETINAL DETACHMENT SURGERY     Social  History   Socioeconomic History  . Marital status: Married    Spouse name: Not on file  . Number of children: 2  . Years of education: Not on file  . Highest education level: Not on file  Occupational History  . Occupation: Ship broker, part time job    Comment: Market researcher, Architect  Tobacco Use  . Smoking status: Never Smoker  . Smokeless tobacco: Never Used  Substance and Sexual Activity  . Alcohol use: Yes    Alcohol/week: 1.0 standard drink    Types: 1 Cans of beer per week    Comment: socially   . Drug use: No  . Sexual activity: Not on file    Comment: not asked  Other Topics Concern  . Not on file  Social History Narrative   2 children + 2 step children ---    Has a part time job, Ship broker    Exercise swimming   Social Determinants of Health   Financial Resource Strain: Not on file  Food Insecurity: Not on file  Transportation Needs: Not on file  Physical Activity: Not on file  Stress: Not on file  Social Connections: Not on file  Intimate Partner Violence: Not on file   Current Outpatient Medications on File Prior to Visit  Medication Sig Dispense Refill  . ALPRAZolam (XANAX) 0.5 MG tablet Take 1 tablet (0.5 mg total) by mouth 2 (two) times daily as needed for anxiety. 20 tablet 0  . amphetamine-dextroamphetamine (ADDERALL XR) 20 MG 24 hr capsule Take 1 Capsules by mouth every day prn 30 capsule 0  . BD PEN NEEDLE NANO U/F 32G X 4 MM MISC Use 4 times daily NO REFILLS WITHOUT APPOINTMENT 400 each 0  . Blood Glucose Monitoring Suppl (ONETOUCH VERIO) w/Device KIT Use to check blood sugar 3 times a day. 1 kit 0  . glucose blood (ONETOUCH VERIO) test strip USE TO CHECK BLOOD SUGAR THREE TIMES DAILY 100 strip 0  . hydrochlorothiazide (HYDRODIURIL) 25 MG tablet Take 1 tablet (25 mg total) by mouth daily. 90 tablet 1  . Insulin Glargine (LANTUS SOLOSTAR) 100 UNIT/ML Solostar Pen Inject 25 Units into the skin daily. 15 pen 5  . insulin regular (NOVOLIN R RELION) 100 units/mL  injection Inject 0.16-0.2 mLs (16-20 Units total) into the skin 3 (three) times daily before meals. NO FURTHER REFILLS WITHOUT APPOINTMENT 20 mL 0  . Insulin Syringe-Needle U-100 (RELION INSULIN SYRINGE) 31G X 15/64" 0.5 ML MISC Use to inject insulin 3 times a day. NO REFILLS WITHOUT APPOINTMENT 300 each 0  . Lancet Devices (ACCU-CHEK SOFTCLIX) lancets Use as instructed 1 each 0  . Lancets (ONETOUCH ULTRASOFT) lancets Use to check blood sugar 3 times a day 100 each 0  . lovastatin (MEVACOR) 20 MG tablet Take 1 tablet (20  mg total) by mouth at bedtime. 90 tablet 1  . magnesium chloride (SLOW-MAG) 64 MG TBEC SR tablet Take 1 tablet (64 mg total) by mouth daily. (Patient not taking: Reported on 07/31/2020) 30 tablet 1  . metFORMIN (GLUCOPHAGE) 1000 MG tablet TAKE 1 TABLET BY MOUTH TWICE DAILY. NO FURTHER REFILLS WITHOUT APPOINTMENT 180 tablet 3  . metoprolol tartrate (LOPRESSOR) 100 MG tablet Take 1 tablet (100 mg total) by mouth 2 (two) times daily. 180 tablet 1  . omeprazole (PRILOSEC) 20 MG capsule Take 1 capsule (20 mg total) by mouth daily. 90 capsule 1  . OZEMPIC, 0.25 OR 0.5 MG/DOSE, 2 MG/1.5ML SOPN INJECT 0.5MG INTO THE SKIN ONCE A WEEK 1.5 mL 0  . ramipril (ALTACE) 10 MG capsule Take 1 capsule (10 mg total) by mouth 2 (two) times daily. 180 capsule 1   No current facility-administered medications on file prior to visit.   No Known Allergies Family History  Problem Relation Age of Onset  . Diabetes Father   . Hypertension Father   . Colon polyps Father   . Hypertension Mother   . Asthma Mother   . Prostate cancer Neg Hx      Objective:   Physical Exam BP 128/82 (BP Location: Right Arm, Patient Position: Sitting, Cuff Size: Normal)   Pulse 97   Ht 5' 10" (1.778 m)   Wt 203 lb 12.8 oz (92.4 kg)   SpO2 96%   BMI 29.24 kg/m  Body mass index is 29.24 kg/m. Wt Readings from Last 3 Encounters:  09/23/20 203 lb 12.8 oz (92.4 kg)  07/31/20 205 lb 12.8 oz (93.4 kg)  06/01/19 209 lb  (94.8 kg)   Constitutional: overweight, in NAD Eyes: PERRLA, EOMI, no exophthalmos ENT: moist mucous membranes, no thyromegaly, no cervical lymphadenopathy Cardiovascular: tachycardia, RR, No MRG Respiratory: CTA B Gastrointestinal: abdomen soft, NT, ND, BS+ Musculoskeletal: no deformities, strength intact in all 4 Skin: moist, warm, no rashes Neurological: no tremor with outstretched hands, DTR normal in all 4   Assessment:     1.  DM2, uncontrolled, insulin-dependent, with complications - peripheral neuropathy - stable  2. HL  3.  Overweight  Plan:     1. Pt with longstanding, uncontrolled, type 2 diabetes, with noncompliance with visits and insulin doses.  At last visit, he returned after more than a year and he was off his basal insulin.  HbA1c was high, at 9.3%.  He returns after another long absence of a year and 4 months, after he no showed 2 appointments last year.  Most recent HbA1c obtained 2 months ago, was still high, at 9.4%. -At last visit, we restarted his Lantus insulin and did not change his regular insulin doses.  We continued metformin and Ozempic. -At this visit, we discussed about his noncompliance with appointments and recommended regimen.I did plan to discharge him today, but he mentions that he was noncompliant with the appointments due to the coronavirus pandemic.  He is not determined to get control of his diabetes and come for the appointments.  I will give him another chance to come to the appt or follow the instructions, he should not schedule another appt. -At this visit, sugars are quite high, with occasional low blood sugars at night.  I advised him to decrease his regular insulin before dinner to avoid drops during the night especially since these may appear in the first half of the night, but we will also go ahead and increase the Lantus, of which he  is using low doses and taking it inconsistently, and we will also increase Ozempic dose. -I gave him a  blood sugar log and I strongly advised him to start checking his blood sugars and write them down. - I advised him to: Patient Instructions  Please continue: - Metformin 1000 mg 2x a day  Please decrease: - R insulin 30 minutes before meals: 16-20 units before breakfast and lunch but 14-16 before dinner  Please increase: - Lantus 25 units in am (EVERY DAY) - Ozempic 1 mg weekly  Please come back in 1.5 months.  - we checked his HbA1c: 10.4% (higher) - advised to check sugars at different times of the day - 4x a day, rotating check times - advised for yearly eye exams >> he is not UTD    2. HL -Reviewed latest lipid panel from 07/2020: LDL at goal, triglycerides slightly high: Lab Results  Component Value Date   CHOL 144 07/31/2020   HDL 46 07/31/2020   LDLCALC 67 07/31/2020   TRIG 186 (H) 07/31/2020   CHOLHDL 3.1 07/31/2020  -He continues lovastatin 20 mg daily without side effects.  3.  Overweight -Continues Ozempic, which should also help with weight loss.  At this visit, will increase the dose. -He lost 6 pounds since last visit  [name], MD PhD [facility]

## 2020-09-23 NOTE — Patient Instructions (Addendum)
Please continue: - Metformin 1000 mg 2x a day  Please decrease: - R insulin 30 minutes before meals: 16-20 units before breakfast and lunch but 14-16 before dinner  Please increase: - Lantus 25 units in am (EVERY DAY) - Ozempic 1 mg weekly  Please come back in 1.5 months.

## 2020-10-27 ENCOUNTER — Other Ambulatory Visit: Payer: Self-pay | Admitting: Internal Medicine

## 2020-10-27 DIAGNOSIS — E1165 Type 2 diabetes mellitus with hyperglycemia: Secondary | ICD-10-CM

## 2020-11-17 ENCOUNTER — Other Ambulatory Visit: Payer: Self-pay

## 2020-11-17 ENCOUNTER — Encounter: Payer: Self-pay | Admitting: Internal Medicine

## 2020-11-17 ENCOUNTER — Ambulatory Visit: Payer: 59 | Admitting: Internal Medicine

## 2020-11-17 VITALS — BP 124/82 | HR 104 | Ht 70.0 in | Wt 205.0 lb

## 2020-11-17 DIAGNOSIS — E663 Overweight: Secondary | ICD-10-CM | POA: Diagnosis not present

## 2020-11-17 DIAGNOSIS — E119 Type 2 diabetes mellitus without complications: Secondary | ICD-10-CM | POA: Diagnosis not present

## 2020-11-17 DIAGNOSIS — E785 Hyperlipidemia, unspecified: Secondary | ICD-10-CM

## 2020-11-17 DIAGNOSIS — E1165 Type 2 diabetes mellitus with hyperglycemia: Secondary | ICD-10-CM

## 2020-11-17 DIAGNOSIS — Z794 Long term (current) use of insulin: Secondary | ICD-10-CM

## 2020-11-17 MED ORDER — BLOOD GLUCOSE TEST VI STRP: ORAL_STRIP | 3 refills | Status: DC

## 2020-11-17 MED ORDER — LANTUS SOLOSTAR 100 UNIT/ML ~~LOC~~ SOPN: 30.0000 [IU] | PEN_INJECTOR | Freq: Every day | SUBCUTANEOUS | Status: DC

## 2020-11-17 MED ORDER — ONETOUCH VERIO VI STRP: ORAL_STRIP | 3 refills | Status: DC

## 2020-11-17 NOTE — Patient Instructions (Addendum)
Please continue: - Metformin 1000 mg 2x a day - R insulin 30 minutes before meals: 16-20 units before breakfast and lunch but 14-16 before dinner - Ozempic 1 mg weekly  Please increase: - Lantus 30-34 units in am (EVERY DAY)  Try to see if Livongo meter is covered.  Please come back in 3 months.

## 2020-11-17 NOTE — Progress Notes (Signed)
Subjective:     Patient ID: Raymond Pugh, male   DOB: March 24, 1961, 60 y.o.   MRN: [id number]  Diabetes Mr. Smolinsky is a 60 y.o. man,returning for f/u for management of DM2, dx 2008, uncontrolled, insulin-dependent, with complications (peripheral neuropathy). Last visit 1.5 months ago. He is not usually compliant with appointments and insulin doses.    Interim history: At last visit, he had an ulcer in the left toe-treated by podiatry.  This is considered superficial.  No pain. No increased urination, blurry vision, nausea, chest pain. He is now a grandfather - new grandbaby (a boy) born yesterday  - he is tired.  Reviewed HbA1c levels: Lab Results  Component Value Date   HGBA1C 10.4 (A) 09/23/2020   HGBA1C 9.4 (H) 07/31/2020   HGBA1C 9.3 (A) 05/29/2019   He is now on: - Metformin 1000 mg 2x a day - Ozempic 0.5 >> 1 mg weekly - Lantus 56-60 >> 25 >> but actually taking 10 units at dinnertime >> 25 units at night - R insulin - take 30 minutes before meals: 14-20 >> 16-20 units before "most" meals Previously on Glipizide. He has hypoglycemia awareness at <90.  He checks his sugars 3-4x times a day - no log, no meter: - a.m.: 93, 125-270 >> 135, 150-190 >> 190-250 >> 105-220 (ave 160) - 2h after b'fast:  198-465 >> 264, 270 >> 135-150 >> n/c - before lunch: 217-315 >> 200s >> Pack of nabs: 150-170 >> 180-200s - 2h after lunch: 124, 276-360 >> 315, 323 >> n/c - before dinne/r: 1 109, 124, 298, 349 >> 190-200 >> 200s >> 140-160, 200 - after dinner: 94, 134-466 >> 200-300 >> 200-300s - bedtime: see above >> 303-429 >> 187-366, 381 >> n/c Highest: 481 >> 598 >> n/c >> 400 (Christmas) >> 370  >> 600 x1 (no insulin), 200s OTW. Lowest: 124 >> 93 >> 50s at night - given too much R insulin >> 30s (during the night; unclear why) >> 50 x1 (at night) He had sugars of 600s in 12/2015 as he felt low and started to drink juice without checking his sugars!   -No CKD. Last BUN/Cr: Lab Results   Component Value Date   BUN 13 07/31/2020   Lab Results  Component Value Date   CREATININE 0.72 (L) 07/31/2020  On ramipril.  -+ HL; last lipids: Lab Results  Component Value Date   CHOL 144 07/31/2020   HDL 46 07/31/2020   LDLCALC 67 07/31/2020   TRIG 186 (H) 07/31/2020   CHOLHDL 3.1 07/31/2020  On lovastatin.  - Last eye exam: 01/2017: Reportedly no DR.  [facility]. He has a history of retinal detachment surgery.  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   Review of Systems Constitutional: no weight gain/no weight loss, + fatigue, no subjective hyperthermia, no subjective hypothermia Eyes: no blurry vision, no xerophthalmia ENT: no sore throat, no nodules palpated in neck, no dysphagia, no odynophagia, no hoarseness Cardiovascular: no CP/no SOB/no palpitations/no leg swelling Respiratory: no cough/no SOB/no wheezing Gastrointestinal: no N/no V/no D/no C/no acid reflux Musculoskeletal: no muscle aches/no joint aches Skin: no rashes, no hair loss Neurological: no tremors/no numbness/no tingling/no dizziness  I reviewed pt's medications, allergies, PMH, social hx, family hx, and changes were documented in the history of present illness. Otherwise, unchanged from my initial visit note.  Past Medical History:  Diagnosis Date   ADHD (attention deficit hyperactivity disorder)    Anemia  Barrett's esophagus    Diabetes mellitus    Hiatal hernia    Hyperlipemia    Hypertension    Myocarditis ([place])    h/o mypocarditis, caused cardiomyopathy-- resolved    Past Surgical History:  Procedure Laterality Date   ABSCESS DRAIN LIVER PERC ([facility] HX)     klebsiella on liver pa states    EYE SURGERY     HYDROCELE EXCISION / REPAIR     radiokeratotomy     RETINAL DETACHMENT SURGERY     Social History   Socioeconomic History   Marital status: Married    Spouse name: Not on file   Number of children: 2   Years of education: Not on file   Highest  education level: Not on file  Occupational History   Occupation: Ship broker, 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AC, Architect  Tobacco Use   Smoking status: Never   Smokeless tobacco: Never  Substance and Sexual Activity   Alcohol use: Yes    Alcohol/week: 1.0 standard drink    Types: 1 Cans of beer per week    Comment: socially    Drug use: No   Sexual activity: Not on file    Comment: not asked  Other Topics Concern   Not on file  Social History Narrative   2 children + 2 step children ---    Has a part time job, Ship broker    Exercise swimming   Social Determinants of Health   Financial Resource Strain: Not on file  Food Insecurity: Not on file  Transportation Needs: Not on file  Physical Activity: Not on file  Stress: Not on file  Social Connections: Not on file  Intimate Partner Violence: Not on file   Current Outpatient Medications on File Prior to Visit  Medication Sig Dispense Refill   ALPRAZolam (XANAX) 0.5 MG tablet Take 1 tablet (0.5 mg total) by mouth 2 (two) times daily as needed for anxiety. 20 tablet 0   amphetamine-dextroamphetamine (ADDERALL XR) 20 MG 24 hr capsule Take 1 Capsules by mouth every day prn 30 capsule 0   BD PEN NEEDLE NANO U/F 32G X 4 MM MISC Use 4 times daily NO REFILLS WITHOUT APPOINTMENT 400 each 0   Blood Glucose Monitoring Suppl (ONETOUCH VERIO) w/Device KIT Use to check blood sugar 3 times a day. 1 kit 0   glucose blood (ONETOUCH VERIO) test strip USE TO CHECK BLOOD SUGAR THREE TIMES DAILY 100 strip 0   hydrochlorothiazide (HYDRODIURIL) 25 MG tablet Take 1 tablet (25 mg total) by mouth daily. 90 tablet 1   Insulin Glargine (LANTUS SOLOSTAR) 100 UNIT/ML Solostar Pen Inject 25 Units into the skin daily. 15 pen 5   insulin regular (NOVOLIN R RELION) 100 units/mL injection Inject 0.16-0.2 mLs (16-20 Units total) into the skin 3 (three) times daily before meals. NO FURTHER REFILLS WITHOUT APPOINTMENT 20 mL 0   Insulin Syringe-Needle U-100 (RELION  INSULIN SYRINGE) 31G X 15/64" 0.5 ML MISC Use to inject insulin 3 times a day. NO REFILLS WITHOUT APPOINTMENT 300 each 0   Lancet Devices (ACCU-CHEK SOFTCLIX) lancets Use as instructed 1 each 0   Lancets (ONETOUCH ULTRASOFT) lancets Use to check blood sugar 3 times a day 100 each 0   lovastatin (MEVACOR) 20 MG tablet Take 1 tablet (20 mg total) by mouth at bedtime. 90 tablet 1   magnesium chloride (SLOW-MAG) 64 MG TBEC SR tablet Take 1 tablet (64 mg total) by mouth daily. 30 tablet 1  metFORMIN (GLUCOPHAGE) 1000 MG tablet TAKE 1 TABLET BY MOUTH TWICE DAILY. NO FURTHER REFILLS WITHOUT APPOINTMENT 180 tablet 3   metoprolol tartrate (LOPRESSOR) 100 MG tablet Take 1 tablet (100 mg total) by mouth 2 (two) times daily. 180 tablet 1   omeprazole (PRILOSEC) 20 MG capsule Take 1 capsule (20 mg total) by mouth daily. 90 capsule 1   ramipril (ALTACE) 10 MG capsule Take 1 capsule (10 mg total) by mouth 2 (two) times daily. 180 capsule 1   Semaglutide, 1 MG/DOSE, (OZEMPIC, 1 MG/DOSE,) 4 MG/3ML SOPN Inject 1 mg into the skin once a week. 9 mL 3   No current facility-administered medications on file prior to visit.   No Known Allergies Family History  Problem Relation Age of Onset   Diabetes Father    Hypertension Father    Colon polyps Father    Hypertension Mother    Asthma Mother    Prostate cancer Neg Hx      Objective:   Physical Exam BP 124/82   Pulse (!) 104   Ht '5\' 10"'  (1.778 m)   Wt 205 lb (93 kg)   SpO2 97%   BMI 29.41 kg/m  Body mass index is 29.41 kg/m. Wt Readings from Last 3 Encounters:  11/17/20 205 lb (93 kg)  09/23/20 203 lb 12.8 oz (92.4 kg)  07/31/20 205 lb 12.8 oz (93.4 kg)   Constitutional: overweight, in NAD Eyes: PERRLA, EOMI, no exophthalmos ENT: moist mucous membranes, no thyromegaly, no cervical lymphadenopathy Cardiovascular: tachycardia, RR, No MRG Respiratory: CTA B Gastrointestinal: abdomen soft, NT, ND, BS+ Musculoskeletal: no deformities, strength  intact in all 4 Skin: moist, warm, no rashes Neurological: no tremor with outstretched hands, DTR normal in all 4   Assessment:     1.  DM2, uncontrolled, insulin-dependent, with complications - peripheral neuropathy - stable  2. HL  3.  Overweight  Plan:     1. Pt with Longstanding, uncontrolled, type 2 diabetes, with noncompliance with visits and insulin doses.  At last visit, we discussed that if he was not coming for the appointment and following the recommended regimen, I would not be able to see him.  He was telling me that he was determined to gain control of his diabetes to return for the appointment.  I decided to give him another chance. -At last visit, sugars were quite high, with occasional low blood sugars at night and we discussed about decreasing her regular insulin before dinner to avoid drops during the night especially since they were appearing in the first half of the night.  We also increase the Lantus, which she was using at low doses and taking it inconsistently.  I advised him to take this every day.  We also increase his Ozempic dose to 1 mg weekly.  I gave him a blood sugar log and I strongly advised him to start checking blood sugars and write them down. -At today's visit, sugars are may be slightly better, but still fluctuating and higher than target at most times.  He is not sure what can influence the blood sugars.  He did have 1 low blood sugar in the 50s in the second half of the night, but no other lows since last visit.  At this visit, we discussed that he is still on the lower dose of Lantus compared to the regular insulin that he gets for an entire day so I advised him to increase the Lantus slightly.  We will continue Ozempic and metformin,  which she tolerates well. -He still does not have a log or meter with him today.  He lost his log.  Discussed about possibly getting a [employer] meter.  I sent the strips to his pharmacy both for Volo meter and for his One  Touch Verio reflect meter - I advised him to: Patient Instructions  Please continue: - Metformin 1000 mg 2x a day - R insulin 30 minutes before meals: 16-20 units before breakfast and lunch but 14-16 before dinner - Ozempic 1 mg weekly  Please increase: - Lantus 30-34 units in am (EVERY DAY)  Try to see if [employer] meter is covered.  Please come back in 3 months.  - we we will check his HbA1c at next visit - advised to check sugars at different times of the day - 4x a day, rotating check times - advised for yearly eye exams >> he is UTD - return to clinic in 3 months    2. HL -Reviewed latest lipid panel from 07/2020: LDL at goal, triglycerides slightly high, will Lab Results  Component Value Date   CHOL 144 07/31/2020   HDL 46 07/31/2020   LDLCALC 67 07/31/2020   TRIG 186 (H) 07/31/2020   CHOLHDL 3.1 07/31/2020  -He continues on lovastatin 20 mg daily without side effects  3.  Overweight -Continues on Ozempic weekly which should also help with weight loss -Lost 6 pounds before last visit, weight stable since then  [name], MD PhD [facility]

## 2021-02-23 ENCOUNTER — Ambulatory Visit: Payer: 59 | Admitting: Internal Medicine

## 2021-03-11 ENCOUNTER — Ambulatory Visit: Payer: 59 | Admitting: Internal Medicine

## 2021-03-25 ENCOUNTER — Ambulatory Visit (INDEPENDENT_AMBULATORY_CARE_PROVIDER_SITE_OTHER): Payer: 59 | Admitting: Internal Medicine

## 2021-03-25 ENCOUNTER — Encounter: Payer: Self-pay | Admitting: Internal Medicine

## 2021-03-25 ENCOUNTER — Other Ambulatory Visit: Payer: Self-pay

## 2021-03-25 VITALS — BP 120/78 | HR 95 | Ht 70.0 in | Wt 199.0 lb

## 2021-03-25 DIAGNOSIS — E1165 Type 2 diabetes mellitus with hyperglycemia: Secondary | ICD-10-CM

## 2021-03-25 DIAGNOSIS — E785 Hyperlipidemia, unspecified: Secondary | ICD-10-CM

## 2021-03-25 DIAGNOSIS — E663 Overweight: Secondary | ICD-10-CM | POA: Diagnosis not present

## 2021-03-25 DIAGNOSIS — Z794 Long term (current) use of insulin: Secondary | ICD-10-CM | POA: Diagnosis not present

## 2021-03-25 LAB — POCT GLYCOSYLATED HEMOGLOBIN (HGB A1C): Hemoglobin A1C: 8.7 % — AB (ref 4.0–5.6)

## 2021-03-25 MED ORDER — FREESTYLE LIBRE 3 SENSOR MISC: 1.0000 | 3 refills | Status: DC

## 2021-03-25 NOTE — Patient Instructions (Addendum)
Please continue: - Metformin 1000 mg 2x a day - R insulin 30 minutes before meals: 16-20 units  - Ozempic 1 mg weekly  Please increase: - Lantus 36-38 units at night  The most common suppliers for the continuous glucose monitor are:  [facility]: [phone] Ext [phone] [employer]: [place]: [place]: [place]: [phone] [place] [employer]: [place]!  Please come back in 3-4 months.

## 2021-03-25 NOTE — Progress Notes (Signed)
Subjective:     Patient ID: Raymond Pugh, male   DOB: May 11, 1961, 60 y.o.   MRN: [id number]  Diabetes Raymond Pugh is a 60 y.o. man,returning for f/u for management of DM2, dx 2008, uncontrolled, insulin-dependent, with complications (peripheral neuropathy). Last visit 4 months ago.  Interim history: He had a left toe ulcer, treated by podiatry.  This was considered superficial.  It has healed since last visit. No increased urination, blurry vision, nausea, chest pain.  Reviewed HbA1c levels: Lab Results  Component Value Date   HGBA1C 8.7 (A) 03/25/2021   HGBA1C 10.4 (A) 09/23/2020   HGBA1C 9.4 (H) 07/31/2020   HGBA1C 9.3 (A) 05/29/2019   HGBA1C 9.5 (H) 11/08/2018   HGBA1C 10.4 (A) 02/21/2018   HGBA1C 10.1 03/25/2017   HGBA1C 9.2 12/22/2016   HGBA1C 10.1 07/20/2016   HGBA1C 14.0 04/08/2016   HGBA1C >14.0 11/24/2015   HGBA1C 9.6 08/20/2015   HGBA1C 11.2 05/10/2015   HGBA1C 8.7 (H) 11/21/2014   HGBA1C 11.3 03/19/2014   HGBA1C 10.9 (H) 10/30/2012   HGBA1C 13.3 (H) 06/06/2012   HGBA1C 11.6 (H) 07/03/2010   HGBA1C 7.1 (H) 11/07/2009   HGBA1C 7.1 (H) 07/24/2009   HGBA1C 8.0 (H) 10/03/2007   HGBA1C 8.4 (H) 07/04/2007   HGBA1C 8.3 (H) 04/05/2007   HGBA1C 7.5 (H) 05/09/2006   He is now on: - Metformin 1000 mg 2x a day - Ozempic 0.5 >> 1 mg weekly - Lantus 56-60 >> ... 25 units at night >> 30-34 units daily ("as needed" -4-5x a week!) - R insulin 14-20 >> 16-24 units before "most" meals Previously on Glipizide. He has hypoglycemia awareness at <90.  He checks his sugars 2-3x times a day - no log, no meter: - a.m.:  135, 150-190 >> 190-250 >> 105-220 (ave 160) >> 150-185 - 2h after b'fast:  198-465 >> 264, 270 >> 135-150 >> n/c - before lunch: 200s >> Pack of nabs: 150-170 >> 180-200s >> n/c - 2h after lunch: 124, 276-360 >> 315, 323 >> n/c - before dinne/r: 190-200 >> 200s >> 140-160, 200 >> 150-175 - after dinner: 94, 134-466 >> 200-300 >> 200-300s >> >180-upper 250 -  bedtime: see above >> 303-429 >> 187-366, 381 >> n/c Highest: 370  >> 600 x1 (no insulin), 200s OTW >> >300s. Lowest:  50 x1 (at night) >> 89. Has hypogycemia awareness at 75s. He had sugars of 600s in 12/2015 as he felt low and started to drink juice without checking his sugars!   -No CKD. Last BUN/Cr: Lab Results  Component Value Date   BUN 13 07/31/2020   Lab Results  Component Value Date   CREATININE 0.72 (L) 07/31/2020  On ramipril.  -+ HL; last lipids: Lab Results  Component Value Date   CHOL 144 07/31/2020   HDL 46 07/31/2020   LDLCALC 67 07/31/2020   TRIG 186 (H) 07/31/2020   CHOLHDL 3.1 07/31/2020  On lovastatin.  - Last eye exam: 01/2017: Reportedly no DR.  [facility]. He has a history of retinal detachment surgery.  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   Review of Systems + see HPI  I reviewed pt's medications, allergies, PMH, social hx, family hx, and changes were documented in the history of present illness. Otherwise, unchanged from my initial visit note.  Past Medical History:  Diagnosis Date   ADHD (attention deficit hyperactivity disorder)    Anemia    Barrett's esophagus  Diabetes mellitus    Hiatal hernia    Hyperlipemia    Hypertension    Myocarditis ([place])    h/o mypocarditis, caused cardiomyopathy-- resolved    Past Surgical History:  Procedure Laterality Date   ABSCESS DRAIN LIVER PERC ([facility] HX)     klebsiella on liver pa states    EYE SURGERY     HYDROCELE EXCISION / REPAIR     radiokeratotomy     RETINAL DETACHMENT SURGERY     Social History   Socioeconomic History   Marital status: Married    Spouse name: Not on file   Number of children: 2   Years of education: Not on file   Highest education level: Not on file  Occupational History   Occupation: Ship broker, 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AC, Architect  Tobacco Use   Smoking status: Never   Smokeless tobacco: Never  Substance and Sexual  Activity   Alcohol use: Yes    Alcohol/week: 1.0 standard drink    Types: 1 Cans of beer per week    Comment: socially    Drug use: No   Sexual activity: Not on file    Comment: not asked  Other Topics Concern   Not on file  Social History Narrative   2 children + 2 step children ---    Has a part time job, Ship broker    Exercise swimming   Social Determinants of Health   Financial Resource Strain: Not on file  Food Insecurity: Not on file  Transportation Needs: Not on file  Physical Activity: Not on file  Stress: Not on file  Social Connections: Not on file  Intimate Partner Violence: Not on file   Current Outpatient Medications on File Prior to Visit  Medication Sig Dispense Refill   ALPRAZolam (XANAX) 0.5 MG tablet Take 1 tablet (0.5 mg total) by mouth 2 (two) times daily as needed for anxiety. 20 tablet 0   amphetamine-dextroamphetamine (ADDERALL XR) 20 MG 24 hr capsule Take 1 Capsules by mouth every day prn 30 capsule 0   BD PEN NEEDLE NANO U/F 32G X 4 MM MISC Use 4 times daily NO REFILLS WITHOUT APPOINTMENT 400 each 0   Blood Glucose Monitoring Suppl (ONETOUCH VERIO) w/Device KIT Use to check blood sugar 3 times a day. 1 kit 0   Glucose Blood (BLOOD GLUCOSE TEST STRIPS) STRP Livongo (if covered) - check sugars 3x a day 300 strip 3   glucose blood (ONETOUCH VERIO) test strip USE TO CHECK BLOOD SUGAR THREE TIMES DAILY 300 strip 3   hydrochlorothiazide (HYDRODIURIL) 25 MG tablet Take 1 tablet (25 mg total) by mouth daily. 90 tablet 1   insulin glargine (LANTUS SOLOSTAR) 100 UNIT/ML Solostar Pen Inject 30-34 Units into the skin at bedtime.     insulin regular (NOVOLIN R RELION) 100 units/mL injection Inject 0.16-0.2 mLs (16-20 Units total) into the skin 3 (three) times daily before meals. NO FURTHER REFILLS WITHOUT APPOINTMENT 20 mL 0   Insulin Syringe-Needle U-100 (RELION INSULIN SYRINGE) 31G X 15/64" 0.5 ML MISC Use to inject insulin 3 times a day. NO REFILLS WITHOUT APPOINTMENT  300 each 0   Lancet Devices (ACCU-CHEK SOFTCLIX) lancets Use as instructed 1 each 0   Lancets (ONETOUCH ULTRASOFT) lancets Use to check blood sugar 3 times a day 100 each 0   lovastatin (MEVACOR) 20 MG tablet Take 1 tablet (20 mg total) by mouth at bedtime. 90 tablet 1   magnesium chloride (SLOW-MAG) 64 MG TBEC  SR tablet Take 1 tablet (64 mg total) by mouth daily. 30 tablet 1   metFORMIN (GLUCOPHAGE) 1000 MG tablet TAKE 1 TABLET BY MOUTH TWICE DAILY. NO FURTHER REFILLS WITHOUT APPOINTMENT 180 tablet 3   metoprolol tartrate (LOPRESSOR) 100 MG tablet Take 1 tablet (100 mg total) by mouth 2 (two) times daily. 180 tablet 1   omeprazole (PRILOSEC) 20 MG capsule Take 1 capsule (20 mg total) by mouth daily. 90 capsule 1   ramipril (ALTACE) 10 MG capsule Take 1 capsule (10 mg total) by mouth 2 (two) times daily. 180 capsule 1   Semaglutide, 1 MG/DOSE, (OZEMPIC, 1 MG/DOSE,) 4 MG/3ML SOPN Inject 1 mg into the skin once a week. 9 mL 3   No current facility-administered medications on file prior to visit.   No Known Allergies Family History  Problem Relation Age of Onset   Diabetes Father    Hypertension Father    Colon polyps Father    Hypertension Mother    Asthma Mother    Prostate cancer Neg Hx      Objective:   Physical Exam BP 120/78 (BP Location: Right Arm, Patient Position: Sitting, Cuff Size: Normal)   Pulse 95   Ht '5\' 10"'  (1.778 m)   Wt 199 lb (90.3 kg)   SpO2 96%   BMI 28.55 kg/m  Body mass index is 28.55 kg/m. Wt Readings from Last 3 Encounters:  03/25/21 199 lb (90.3 kg)  11/17/20 205 lb (93 kg)  09/23/20 203 lb 12.8 oz (92.4 kg)   Constitutional: overweight, in NAD Eyes: PERRLA, EOMI, no exophthalmos ENT: moist mucous membranes, no thyromegaly, no cervical lymphadenopathy Cardiovascular: tachycardia, RR, No MRG Respiratory: CTA B Gastrointestinal: abdomen soft, NT, ND, BS+ Musculoskeletal: no deformities, strength intact in all 4 Skin: moist, warm, no  rashes Neurological: no tremor with outstretched hands, DTR normal in all 4   Assessment:     1.  DM2, uncontrolled, insulin-dependent, with complications - peripheral neuropathy - stable  2. HL  3.  Overweight  Plan:     1. Pt with longstanding, uncontrolled, type 2 diabetes, with previous noncompliance with visits and insulin doses.  In the past, we discussed that if he was not coming for appointments and following the recommended regimen, I would not be able to see him.  However, he is now returning up to 4 months from the previous, as advised. -At last visit, sugars were slightly better, but still fluctuating and they were higher than target most times of the day.  Upon questioning, he was still on the lower dose of Lantus than recommended and we did increase this slightly.  We also continued Ozempic, which he was tolerating well.  He did not have a log or meter with him and I recommended to get a Livongo meter.  He also has a One Touch Verio reflect meter.  Test strips for this were also refilled at last visit in case he could not get a Livongo meter.  HbA1c at last visit was still 10.4% (higher). -at this visit, sugars appear to have improved since last visit.  They are still above target, though, and upon questioning, this is due to the fact that he is missing his Lantus dose, which he tells me that he is taking as needed.  I advised him that Lantus insulin is not to be used as needed, but he has to take it every day.  In fact, I did advise him to increase the dose slightly to lower his  sugars throughout the day.  After dinner, sugars are higher, but improving the sugars throughout the day will most likely help the evening blood sugars, too. -He is also interested in a freestyle libre CGM.  I sent a prescription for the freestyle libre 3 through to his pharmacy.  I advised him that if this is not covered through the pharmacy, he will need to get in touch with few suppliers to see which ones  work with his insurance.  I believe that starting on the freestyle libre CGM will further help the blood sugars. - I advised him to: Patient Instructions  Please continue: - Metformin 1000 mg 2x a day - R insulin 30 minutes before meals: 16-20 units  - Ozempic 1 mg weekly  Please increase: - Lantus 36-38 units at night  The most common suppliers for the continuous glucose monitor are:  [facility]: [phone] Ext [facility]: [place]: [place]: [place]: [phone] [place] [employer] [employer]!  Please come back in 3-4 months.  - we checked his HbA1c: 8.7% (!best in a long time) - advised to check sugars at different times of the day - 4x a day, rotating check times - advised for yearly eye exams >> he is not UTD -again strongly advised him to schedule this - return to clinic in 3-4 months  2. HL -Reviewed latest lipid panel from 07/2020: LDL and HDL at goal, TG slightly high: Lab Results  Component Value Date   CHOL 144 07/31/2020   HDL 46 07/31/2020   LDLCALC 67 07/31/2020   TRIG 186 (H) 07/31/2020   CHOLHDL 3.1 07/31/2020  -He continues on lovastatin 20 mg daily without side effects  3.  Overweight -We will continue Ozempic which should also help with weight loss -Weight was stable at last visit, previously lost 6 pounds -lost 6 more lbs since last visit  [name], MD PhD [facility] Endocrinology

## 2021-03-27 ENCOUNTER — Other Ambulatory Visit: Payer: Self-pay | Admitting: Family Medicine

## 2021-03-27 DIAGNOSIS — I10 Essential (primary) hypertension: Secondary | ICD-10-CM

## 2021-03-27 DIAGNOSIS — K2100 Gastro-esophageal reflux disease with esophagitis, without bleeding: Secondary | ICD-10-CM

## 2021-04-02 ENCOUNTER — Other Ambulatory Visit: Payer: Self-pay | Admitting: Family Medicine

## 2021-04-02 DIAGNOSIS — I10 Essential (primary) hypertension: Secondary | ICD-10-CM

## 2021-04-25 ENCOUNTER — Observation Stay (HOSPITAL_COMMUNITY)
Admission: EM | Admit: 2021-04-25 | Discharge: 2021-04-26 | Disposition: A | Discharge status: Home / Self Care | Payer: 59 | Attending: Surgery | Admitting: Surgery

## 2021-04-25 ENCOUNTER — Observation Stay (HOSPITAL_COMMUNITY): Payer: 59

## 2021-04-25 ENCOUNTER — Emergency Department (HOSPITAL_COMMUNITY): Payer: 59

## 2021-04-25 ENCOUNTER — Other Ambulatory Visit: Payer: Self-pay

## 2021-04-25 ENCOUNTER — Encounter (HOSPITAL_COMMUNITY): Payer: Self-pay | Admitting: Emergency Medicine

## 2021-04-25 DIAGNOSIS — Z79899 Other long term (current) drug therapy: Secondary | ICD-10-CM | POA: Diagnosis not present

## 2021-04-25 DIAGNOSIS — Z794 Long term (current) use of insulin: Secondary | ICD-10-CM | POA: Diagnosis not present

## 2021-04-25 DIAGNOSIS — I10 Essential (primary) hypertension: Secondary | ICD-10-CM | POA: Insufficient documentation

## 2021-04-25 DIAGNOSIS — K2270 Barrett's esophagus without dysplasia: Secondary | ICD-10-CM

## 2021-04-25 DIAGNOSIS — T07XXXA Unspecified multiple injuries, initial encounter: Secondary | ICD-10-CM | POA: Diagnosis present

## 2021-04-25 DIAGNOSIS — S065X0A Traumatic subdural hemorrhage without loss of consciousness, initial encounter: Secondary | ICD-10-CM | POA: Diagnosis not present

## 2021-04-25 DIAGNOSIS — S065XAA Traumatic subdural hemorrhage with loss of consciousness status unknown, initial encounter: Secondary | ICD-10-CM

## 2021-04-25 DIAGNOSIS — I6200 Nontraumatic subdural hemorrhage, unspecified: Secondary | ICD-10-CM

## 2021-04-25 DIAGNOSIS — E119 Type 2 diabetes mellitus without complications: Secondary | ICD-10-CM | POA: Insufficient documentation

## 2021-04-25 DIAGNOSIS — S299XXA Unspecified injury of thorax, initial encounter: Secondary | ICD-10-CM | POA: Insufficient documentation

## 2021-04-25 DIAGNOSIS — Y92410 Unspecified street and highway as the place of occurrence of the external cause: Secondary | ICD-10-CM | POA: Insufficient documentation

## 2021-04-25 DIAGNOSIS — Z20822 Contact with and (suspected) exposure to covid-19: Secondary | ICD-10-CM | POA: Diagnosis not present

## 2021-04-25 HISTORY — DX: Traumatic subdural hemorrhage with loss of consciousness status unknown, initial encounter: S06.5XAA

## 2021-04-25 LAB — RESP PANEL BY RT-PCR (FLU A&B, COVID) ARPGX2
Influenza A by PCR: NEGATIVE
Influenza B by PCR: NEGATIVE
SARS Coronavirus 2 by RT PCR: NEGATIVE

## 2021-04-25 LAB — COMPREHENSIVE METABOLIC PANEL WITH GFR
ALT: 15 U/L (ref 0–44)
AST: 25 U/L (ref 15–41)
Albumin: 3.3 g/dL — ABNORMAL LOW (ref 3.5–5.0)
Alkaline Phosphatase: 68 U/L (ref 38–126)
Anion gap: 11 (ref 5–15)
BUN: 13 mg/dL (ref 6–20)
CO2: 25 mmol/L (ref 22–32)
Calcium: 8.6 mg/dL — ABNORMAL LOW (ref 8.9–10.3)
Chloride: 98 mmol/L (ref 98–111)
Creatinine, Ser: 0.87 mg/dL (ref 0.61–1.24)
GFR, Estimated: >60 mL/min
Glucose, Bld: 245 mg/dL — ABNORMAL HIGH (ref 70–99)
Potassium: 3.1 mmol/L — ABNORMAL LOW (ref 3.5–5.1)
Sodium: 134 mmol/L — ABNORMAL LOW (ref 135–145)
Total Bilirubin: 0.6 mg/dL (ref 0.3–1.2)
Total Protein: 6.6 g/dL (ref 6.5–8.1)

## 2021-04-25 LAB — CBC WITH DIFFERENTIAL/PLATELET
Abs Immature Granulocytes: 0.04 10*3/uL (ref 0.00–0.07)
Basophils Absolute: 0.1 10*3/uL (ref 0.0–0.1)
Basophils Relative: 1 %
Eosinophils Absolute: 0.5 10*3/uL (ref 0.0–0.5)
Eosinophils Relative: 4 %
HCT: 42 % (ref 39.0–52.0)
Hemoglobin: 14.1 g/dL (ref 13.0–17.0)
Immature Granulocytes: 0 %
Lymphocytes Relative: 18 %
Lymphs Abs: 2.3 10*3/uL (ref 0.7–4.0)
MCH: 30.9 pg (ref 26.0–34.0)
MCHC: 33.6 g/dL (ref 30.0–36.0)
MCV: 92.1 fL (ref 80.0–100.0)
Monocytes Absolute: 0.9 10*3/uL (ref 0.1–1.0)
Monocytes Relative: 7 %
Neutro Abs: 9.2 10*3/uL — ABNORMAL HIGH (ref 1.7–7.7)
Neutrophils Relative %: 70 %
Platelets: 271 10*3/uL (ref 150–400)
RBC: 4.56 MIL/uL (ref 4.22–5.81)
RDW: 12.9 % (ref 11.5–15.5)
WBC: 12.9 10*3/uL — ABNORMAL HIGH (ref 4.0–10.5)
nRBC: 0 % (ref 0.0–0.2)

## 2021-04-25 LAB — CBG MONITORING, ED
Glucose-Capillary: 228 mg/dL — ABNORMAL HIGH (ref 70–99)
Glucose-Capillary: 236 mg/dL — ABNORMAL HIGH (ref 70–99)
Glucose-Capillary: 205 mg/dL — ABNORMAL HIGH (ref 70–99)

## 2021-04-25 LAB — GLUCOSE, CAPILLARY: Glucose-Capillary: 228 mg/dL — ABNORMAL HIGH (ref 70–99)

## 2021-04-25 MED ORDER — OXYCODONE HCL 5 MG PO TABS
5.0000 mg | ORAL_TABLET | ORAL | Status: DC | PRN
Start: 2021-04-25 — End: 2021-04-26
  Administered 2021-04-25 – 2021-04-26 (×2): 10 mg via ORAL
  Filled 2021-04-25 (×2): qty 2

## 2021-04-25 MED ORDER — ONDANSETRON 4 MG PO TBDP: 4.0000 mg | ORAL_TABLET | Freq: Four times a day (QID) | ORAL | Status: DC | PRN

## 2021-04-25 MED ORDER — RAMIPRIL 5 MG PO CAPS
10.0000 mg | ORAL_CAPSULE | Freq: Every day | ORAL | Status: DC
  Administered 2021-04-26: 11:00:00 10 mg via ORAL
  Filled 2021-04-25: qty 2

## 2021-04-25 MED ORDER — INSULIN ASPART 100 UNIT/ML IJ SOLN
0.0000 [IU] | INTRAMUSCULAR | Status: DC
  Administered 2021-04-25: 8 [IU] via SUBCUTANEOUS
  Administered 2021-04-25: 5 [IU] via SUBCUTANEOUS
  Administered 2021-04-26 (×2): 8 [IU] via SUBCUTANEOUS
  Administered 2021-04-26: 05:00:00 3 [IU] via SUBCUTANEOUS

## 2021-04-25 MED ORDER — INSULIN GLARGINE-YFGN 100 UNIT/ML ~~LOC~~ SOLN
20.0000 [IU] | Freq: Every day | SUBCUTANEOUS | Status: DC
  Administered 2021-04-25: 20 [IU] via SUBCUTANEOUS
  Filled 2021-04-25 (×3): qty 0.2

## 2021-04-25 MED ORDER — PRAVASTATIN SODIUM 10 MG PO TABS
20.0000 mg | ORAL_TABLET | Freq: Every day | ORAL | Status: DC
  Administered 2021-04-25: 20 mg via ORAL
  Filled 2021-04-25: qty 2

## 2021-04-25 MED ORDER — ONDANSETRON HCL 4 MG/2ML IJ SOLN: 4.0000 mg | Freq: Four times a day (QID) | INTRAMUSCULAR | Status: DC | PRN

## 2021-04-25 MED ORDER — ALPRAZOLAM 0.5 MG PO TABS
0.5000 mg | ORAL_TABLET | Freq: Two times a day (BID) | ORAL | Status: DC | PRN
  Administered 2021-04-25: 0.5 mg via ORAL
  Filled 2021-04-25: qty 1

## 2021-04-25 MED ORDER — IOHEXOL 300 MG/ML  SOLN
50.0000 mL | Freq: Once | INTRAMUSCULAR | Status: DC | PRN
  Administered 2021-04-25: 50 mL via ORAL

## 2021-04-25 MED ORDER — TRAMADOL HCL 50 MG PO TABS
50.0000 mg | ORAL_TABLET | Freq: Four times a day (QID) | ORAL | Status: DC | PRN
  Administered 2021-04-25: 50 mg via ORAL
  Filled 2021-04-25: qty 1

## 2021-04-25 MED ORDER — HYDROCODONE-ACETAMINOPHEN 5-325 MG PO TABS
1.0000 | ORAL_TABLET | Freq: Once | ORAL | Status: AC
  Administered 2021-04-25: 1 via ORAL
  Filled 2021-04-25: qty 1

## 2021-04-25 MED ORDER — PANTOPRAZOLE SODIUM 40 MG PO TBEC
40.0000 mg | DELAYED_RELEASE_TABLET | Freq: Every day | ORAL | Status: DC
  Administered 2021-04-26: 11:00:00 40 mg via ORAL
  Filled 2021-04-25: qty 1

## 2021-04-25 MED ORDER — ACETAMINOPHEN 325 MG PO TABS
650.0000 mg | ORAL_TABLET | Freq: Four times a day (QID) | ORAL | Status: DC | PRN
  Administered 2021-04-25: 650 mg via ORAL
  Filled 2021-04-25: qty 2

## 2021-04-25 MED ORDER — INSULIN GLARGINE 100 UNIT/ML SOLOSTAR PEN: 20.0000 [IU] | PEN_INJECTOR | Freq: Every day | SUBCUTANEOUS | Status: DC

## 2021-04-25 MED ORDER — HYDROMORPHONE HCL 1 MG/ML IJ SOLN: 0.5000 mg | INTRAMUSCULAR | Status: DC | PRN

## 2021-04-25 MED ORDER — METOPROLOL TARTRATE 100 MG PO TABS
100.0000 mg | ORAL_TABLET | Freq: Two times a day (BID) | ORAL | Status: DC
  Administered 2021-04-25 – 2021-04-26 (×2): 100 mg via ORAL
  Filled 2021-04-25 (×2): qty 1

## 2021-04-25 MED ORDER — IOHEXOL 300 MG/ML  SOLN
75.0000 mL | Freq: Once | INTRAMUSCULAR | Status: AC | PRN
  Administered 2021-04-25: 75 mL via INTRAVENOUS
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
## 2021-04-25 NOTE — ED Notes (Signed)
Patient transported to CT 
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
## 2021-04-25 NOTE — ED Notes (Signed)
Trauma surgeon at bedside with patient.

## 2021-04-25 NOTE — ED Notes (Signed)
Per Dr. Erie Noe), Pt is allow to have non-diet soda.
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
## 2021-04-25 NOTE — ED Notes (Signed)
ED Provider at bedside. 

## 2021-04-25 NOTE — ED Notes (Signed)
Ophthalmology at bedside with patient.

## 2021-04-26 LAB — GLUCOSE, CAPILLARY
Glucose-Capillary: 166 mg/dL — ABNORMAL HIGH (ref 70–99)
Glucose-Capillary: 268 mg/dL — ABNORMAL HIGH (ref 70–99)
Glucose-Capillary: 270 mg/dL — ABNORMAL HIGH (ref 70–99)
Glucose-Capillary: 272 mg/dL — ABNORMAL HIGH (ref 70–99)

## 2021-04-26 LAB — HIV ANTIBODY (ROUTINE TESTING W REFLEX): HIV Screen 4th Generation wRfx: NONREACTIVE

## 2021-04-26 MED ORDER — LEVETIRACETAM 500 MG PO TABS
500.0000 mg | ORAL_TABLET | Freq: Two times a day (BID) | ORAL | Status: DC
  Administered 2021-04-26: 11:00:00 500 mg via ORAL
  Filled 2021-04-26: qty 1

## 2021-04-26 MED ORDER — OXYCODONE HCL 5 MG PO TABS: 5.0000 mg | ORAL_TABLET | Freq: Four times a day (QID) | ORAL | 0 refills | Status: DC | PRN

## 2021-04-26 MED ORDER — LEVETIRACETAM 500 MG PO TABS: 500.0000 mg | ORAL_TABLET | Freq: Two times a day (BID) | ORAL | 0 refills | Status: DC

## 2021-04-26 MED ORDER — ACETAMINOPHEN 325 MG PO TABS
650.0000 mg | ORAL_TABLET | Freq: Four times a day (QID) | ORAL | Status: DC | PRN
Start: 2021-04-26 — End: 2021-11-21

## 2021-04-26 MED ORDER — ONDANSETRON 4 MG PO TBDP
4.0000 mg | ORAL_TABLET | Freq: Four times a day (QID) | ORAL | 0 refills | Status: DC | PRN
Start: 2021-04-26 — End: 2021-05-08
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
## 2021-04-26 NOTE — Discharge Instructions (Addendum)
[facility] DISCHARGE INSTRUCTIONS  Activity You may resume activities as tolerated.  When to Call us: Fever greater than 100.5 New redness, drainage, or swelling at incision site Severe pain, nausea, or vomiting Jaundice (yellowing of the whites of the eyes or skin)  Medications You need to take Keppra (levetiracetam) 2 times daily for 7 days. This is to help prevent seizures, due to the small amount of blood you have around your brain. You may take oxycodone every 6 hours as needed for severe pain.   Follow-up You need to follow up with Dr. Carlean Purl at [facility] for your Barrett's esophagus. You have previously seen Dr. Carlean Purl in the past.  For questions or concerns, please call the [facility] Surgery office at [phone].

## 2021-04-28 ENCOUNTER — Encounter: Payer: Self-pay | Admitting: Physician Assistant

## 2021-04-29 ENCOUNTER — Ambulatory Visit (INDEPENDENT_AMBULATORY_CARE_PROVIDER_SITE_OTHER): Payer: 59 | Admitting: Registered Nurse

## 2021-04-29 ENCOUNTER — Inpatient Hospital Stay: Payer: 59 | Admitting: Registered Nurse

## 2021-04-29 ENCOUNTER — Other Ambulatory Visit: Payer: Self-pay | Admitting: Surgery

## 2021-04-29 VITALS — BP 130/78 | HR 100 | Temp 97.6°F | Resp 16 | Ht 70.0 in | Wt 200.0 lb

## 2021-04-29 DIAGNOSIS — S065XAA Traumatic subdural hemorrhage with loss of consciousness status unknown, initial encounter: Secondary | ICD-10-CM

## 2021-04-29 DIAGNOSIS — R911 Solitary pulmonary nodule: Secondary | ICD-10-CM
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
## 2021-05-03 ENCOUNTER — Emergency Department (HOSPITAL_COMMUNITY): Payer: 59

## 2021-05-03 ENCOUNTER — Encounter (HOSPITAL_COMMUNITY): Payer: Self-pay

## 2021-05-03 ENCOUNTER — Other Ambulatory Visit: Payer: Self-pay

## 2021-05-03 ENCOUNTER — Emergency Department (HOSPITAL_COMMUNITY)
Admission: EM | Admit: 2021-05-03 | Discharge: 2021-05-03 | Disposition: A | Discharge status: Home / Self Care | Payer: 59 | Attending: Emergency Medicine | Admitting: Emergency Medicine

## 2021-05-03 ENCOUNTER — Encounter: Payer: Self-pay | Admitting: Registered Nurse

## 2021-05-03 DIAGNOSIS — Z794 Long term (current) use of insulin: Secondary | ICD-10-CM | POA: Insufficient documentation

## 2021-05-03 DIAGNOSIS — Z7984 Long term (current) use of oral hypoglycemic drugs: Secondary | ICD-10-CM | POA: Insufficient documentation

## 2021-05-03 DIAGNOSIS — L299 Pruritus, unspecified: Secondary | ICD-10-CM | POA: Insufficient documentation

## 2021-05-03 DIAGNOSIS — I10 Essential (primary) hypertension: Secondary | ICD-10-CM | POA: Diagnosis not present

## 2021-05-03 DIAGNOSIS — S065X0A Traumatic subdural hemorrhage without loss of consciousness, initial encounter: Secondary | ICD-10-CM | POA: Diagnosis not present

## 2021-05-03 DIAGNOSIS — Z20822 Contact with and (suspected) exposure to covid-19: Secondary | ICD-10-CM | POA: Insufficient documentation

## 2021-05-03 DIAGNOSIS — R509 Fever, unspecified: Secondary | ICD-10-CM | POA: Diagnosis not present

## 2021-05-03 DIAGNOSIS — E119 Type 2 diabetes mellitus without complications: Secondary | ICD-10-CM | POA: Insufficient documentation

## 2021-05-03 DIAGNOSIS — S0990XA Unspecified injury of head, initial encounter: Secondary | ICD-10-CM | POA: Diagnosis present

## 2021-05-03 DIAGNOSIS — Y92410 Unspecified street and highway as the place of occurrence of the external cause: Secondary | ICD-10-CM | POA: Diagnosis not present

## 2021-05-03 DIAGNOSIS — S065XAA Traumatic subdural hemorrhage with loss of consciousness status unknown, initial encounter: Secondary | ICD-10-CM

## 2021-05-03 DIAGNOSIS — Z79899 Other long term (current) drug therapy: Secondary | ICD-10-CM | POA: Insufficient documentation

## 2021-05-03 LAB — RESP PANEL BY RT-PCR (FLU A&B, COVID) ARPGX2
Influenza A by PCR: NEGATIVE
Influenza B by PCR: NEGATIVE
SARS Coronavirus 2 by RT PCR: NEGATIVE

## 2021-05-03 NOTE — Discharge Instructions (Addendum)
You came to the emergency department today to be evaluated for your reports of fever and "itchiness," to your head.  Your physical exam was reassuring.  The CT scan obtained showed improvement in your subdural hematoma.  Please make sure to continue following up with your neurosurgeon for further management of your subdural hematoma.  Due to reports of fever you were tested for COVID-19 and influenza.  You can find your test results on your [facility] MyChart later this evening.  If positive for COVID-19 or influenza please self isolate at home for the next 5 days.  Please follow-up with your primary care provider if your symptoms do not improve.  Get help right away if:  You have any symptoms of a stroke. "BE FAST" is an easy way to remember the main warning signs of a stroke: B - Balance. Signs are dizziness, sudden trouble walking, or loss of balance. E - Eyes. Signs are trouble seeing or a sudden change in vision. F - Face. Signs are sudden weakness or numbness of the face, or the face or eyelid drooping on one side. A - Arms. Signs are weakness or numbness in an arm. This happens suddenly and usually on one side of the body. S - Speech. Signs are sudden trouble speaking, slurred speech, or trouble understanding what people say. T - Time. Time to call emergency services. Write down what time symptoms started. You have other signs of a stroke, such as: A sudden, severe headache with no known cause. Nausea or vomiting. Seizure. You have a partial or total loss of consciousness. Confusion.

## 2021-05-03 NOTE — ED Notes (Signed)
Patient transported to CT 
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
## 2021-05-03 NOTE — ED Notes (Signed)
Discharge instructions reviewed with patient and significant other . Patient and significant other verbalized understanding of instructions. Follow-up care and medications were reviewed. Patient ambulatory with steady gait. VSS upon discharge.
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
## 2021-05-08 ENCOUNTER — Encounter: Payer: Self-pay | Admitting: Pulmonary Disease

## 2021-05-08 ENCOUNTER — Other Ambulatory Visit: Payer: Self-pay

## 2021-05-08 ENCOUNTER — Ambulatory Visit (INDEPENDENT_AMBULATORY_CARE_PROVIDER_SITE_OTHER): Payer: 59 | Admitting: Pulmonary Disease

## 2021-05-08 VITALS — BP 130/80 | HR 90 | Ht 70.0 in | Wt 196.6 lb

## 2021-05-08 DIAGNOSIS — Z789 Other specified health status: Secondary | ICD-10-CM | POA: Diagnosis not present

## 2021-05-08 DIAGNOSIS — R911 Solitary pulmonary nodule: Secondary | ICD-10-CM

## 2021-05-08 DIAGNOSIS — R918 Other nonspecific abnormal finding of lung field: Secondary | ICD-10-CM | POA: Diagnosis not present
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
## 2021-05-08 NOTE — Telephone Encounter (Signed)
Will forward to Dr. Valeta Harms as Juluis Rainier of typo in notes.   Thanks

## 2021-05-08 NOTE — Patient Instructions (Signed)
Thank you for visiting Dr. Valeta Harms at [facility]. Today we recommend the following:  Orders Placed This Encounter  Procedures   Procedural/ Surgical Case Request: ROBOTIC ASSISTED NAVIGATIONAL BRONCHOSCOPY   Ambulatory referral to Pulmonology   Tentative bronchoscopy 06/09/2020  Return in about 4 weeks (around 06/05/2021).    Please do your part to reduce the spread of COVID-19.

## 2021-05-11 ENCOUNTER — Telehealth: Payer: Self-pay | Admitting: Pulmonary Disease

## 2021-05-11 DIAGNOSIS — R911 Solitary pulmonary nodule: Secondary | ICD-10-CM | POA: Diagnosis present

## 2021-05-11 NOTE — Telephone Encounter (Signed)
Sched for 1/17 at 7:30.  Pt will go for covid test on 1/13.  Spoke to [place] at [facility] CT and she will make disc.  Left vm for pt to call me for appt info.

## 2021-05-12 ENCOUNTER — Encounter: Payer: Self-pay | Admitting: Physician Assistant

## 2021-05-12 ENCOUNTER — Ambulatory Visit (INDEPENDENT_AMBULATORY_CARE_PROVIDER_SITE_OTHER): Payer: 59 | Admitting: Physician Assistant

## 2021-05-12 VITALS — BP 120/60 | HR 87 | Ht 70.0 in | Wt 201.0 lb

## 2021-05-12 DIAGNOSIS — Z1211 Encounter for screening for malignant neoplasm of colon: Secondary | ICD-10-CM

## 2021-05-12 DIAGNOSIS — Z8719 Personal history of other diseases of the digestive system: Secondary | ICD-10-CM

## 2021-05-12 DIAGNOSIS — R1310 Dysphagia, unspecified: Secondary | ICD-10-CM | POA: Diagnosis not present

## 2021-05-12 MED ORDER — OMEPRAZOLE 20 MG PO CPDR: 20.0000 mg | DELAYED_RELEASE_CAPSULE | Freq: Every morning | ORAL | 3 refills | Status: DC

## 2021-05-12 MED ORDER — OMEPRAZOLE 20 MG PO CPDR
20.0000 mg | DELAYED_RELEASE_CAPSULE | Freq: Every morning | ORAL | 3 refills | Status: DC
Start: 2021-05-12 — End: 2021-05-12

## 2021-05-12 NOTE — Telephone Encounter (Signed)
I spoke to pt and went over appt info with him.
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
## 2021-05-12 NOTE — Patient Instructions (Signed)
If you are age 60 or younger, your body mass index should be between 19-25. Your Body mass index is 28.84 kg/m. If this is out of the aformentioned range listed, please consider follow up with your Primary Care Provider.  ________________________________________________________  The [facility] providers would like to encourage you to use [facility] to communicate with providers for non-urgent requests or questions.  Due to long hold times on the telephone, sending your provider a message by [facility] may be a faster and more efficient way to get a response.  Please allow 48 business hours for a response.  Please remember that this is for non-urgent requests.  _______________________________________________________  Raymond Pugh have been scheduled for an endoscopy and colonoscopy. Please follow the written instructions given to you at your visit today. Please pick up your prep supplies at the pharmacy within the next 1-3 days. If you use inhalers (even only as needed), please bring them with you on the day of your procedure.  Refills of Omeprazole have been sent to your pharmacy.  **Please be sure to update [place] with new insurance card.  Thank you for entrusting me with your care and choosing [facility].  Amy Esterwood, PA-C

## 2021-05-17 ENCOUNTER — Encounter: Payer: Self-pay | Admitting: Internal Medicine

## 2021-05-17 DIAGNOSIS — Z794 Long term (current) use of insulin: Secondary | ICD-10-CM

## 2021-05-18 NOTE — Telephone Encounter (Signed)
PCC's can you look into where and when the pt's covid test is scheduled? Thank you!

## 2021-05-19 ENCOUNTER — Encounter: Payer: Self-pay | Admitting: Pulmonary Disease

## 2021-05-19 ENCOUNTER — Telehealth: Payer: Self-pay | Admitting: Pulmonary Disease

## 2021-05-19 MED ORDER — FREESTYLE LIBRE 3 SENSOR MISC: 1.0000 | 2 refills | Status: DC

## 2021-05-19 NOTE — Telephone Encounter (Signed)
Pt is to go on 1/13 for Covid Test.  Set appt times are not given - pt is to go between 8-3 that day.  The address is [facility].  Their phone # is [phone].  This is a drive up testing site.  I previously gave appt info to pt.  I just tried to call pt & left him a vm - appt info will not show on appt desk. I left him my direct phone # in case he has any questions. Will route back to triage so they can forward this info to pt & close out the message.

## 2021-05-19 NOTE — Telephone Encounter (Signed)
Rx sent to preferred pharmacy.

## 2021-05-19 NOTE — Telephone Encounter (Signed)
Dr. Icard, please see mychart message sent by pt and advise. ?

## 2021-05-19 NOTE — Telephone Encounter (Signed)
Please refer to pt's mychart message on the same thing.

## 2021-05-28 NOTE — Progress Notes (Signed)
°  Cardiology Office Note:    Date:  05/28/2021

## 2021-05-29 ENCOUNTER — Ambulatory Visit (INDEPENDENT_AMBULATORY_CARE_PROVIDER_SITE_OTHER): Payer: 59 | Admitting: Interventional Cardiology

## 2021-05-29 ENCOUNTER — Other Ambulatory Visit: Payer: Self-pay

## 2021-05-29 ENCOUNTER — Encounter: Payer: Self-pay | Admitting: Interventional Cardiology

## 2021-05-29 VITALS — BP 122/72 | HR 82 | Ht 70.0 in | Wt 200.2 lb

## 2021-05-29 DIAGNOSIS — E1159 Type 2 diabetes mellitus with other circulatory complications: Secondary | ICD-10-CM

## 2021-05-29 DIAGNOSIS — I2510 Atherosclerotic heart disease of native coronary artery without angina pectoris: Secondary | ICD-10-CM | POA: Diagnosis not present

## 2021-05-29 DIAGNOSIS — Z794 Long term (current) use of insulin: Secondary | ICD-10-CM

## 2021-05-29 DIAGNOSIS — I10 Essential (primary) hypertension: Secondary | ICD-10-CM | POA: Diagnosis not present

## 2021-05-29 DIAGNOSIS — E782 Mixed hyperlipidemia: Secondary | ICD-10-CM

## 2021-05-29 DIAGNOSIS — Z8679 Personal history of other diseases of the circulatory system: Secondary | ICD-10-CM

## 2021-05-29 NOTE — Patient Instructions (Signed)
Medication Instructions:  Your physician recommends that you continue on your current medications as directed. Please refer to the Current Medication list given to you today.  *If you need a refill on your cardiac medications before your next appointment, please call your pharmacy*   Lab Work: None today If you have labs (blood work) drawn today and your tests are completely normal, you will receive your results only by: [place] (if you have MyChart) OR A paper copy in the mail If you have any lab test that is abnormal or we need to change your treatment, we will call you to review the results.   Testing/Procedures: Your physician has requested that you have en exercise stress myoview. For further information please visit [email or web address]. Please follow instruction sheet, as given.    Follow-Up: As needed.  Provider:   Cheral Almas Tamala Julian III

## 2021-05-29 NOTE — Progress Notes (Signed)
Cardiology Office Note:    Date:  05/29/2021   ID:  Raymond Pugh, DOB 03-Jan-1961, MRN [id number]  PCP:  Wendie Agreste, MD  Cardiologist:  None   Referring MD: Wendie Agreste, MD   Chief Complaint  Patient presents with   Coronary Artery Disease   Hypertension   Follow-up    Diabetes type 2 Hyperlipidemia    History of Present Illness:    Raymond Pugh is a 61 y.o. male with a hx of asymptomatic CAD identified by CT scan performed for other purposes.  Please see report below.  The patient has a history of hypertension, hyperlipidemia, type 2 diabetes mellitus, and prior history of systolic heart failure.  The patient informs [place] concerning a prior documented history of heart failure.  He was previously seen by Dr. Crissie Sickles.  He was started on a medication, digoxin.  His blood pressure was aggressively controlled.  He states that the heart failure "went away".  This was greater than 10 years ago.  He saw Dr. Marlou Porch in 2017 because of concern by Dr. Larose Kells for an abnormal EKG.  An echocardiogram performed at that time demonstrated the findings noted below.  He is referred by his primary physician at this point because of asymptomatic coronary disease.  In speaking to the patient he denies chest discomfort, pressure, significant exertional dyspnea.  He is able to walk up a flight of stairs without difficulty.  He can walk briskly without a problem.  He denies palpitations and syncope.  Past Medical History:  Diagnosis Date   ADHD (attention deficit hyperactivity disorder)    Anemia    Barrett's esophagus    Diabetes mellitus    Hiatal hernia    Hyperlipemia    Hypertension    Myocarditis ([place])    h/o mypocarditis, caused cardiomyopathy-- resolved     Past Surgical History:  Procedure Laterality Date   ABSCESS DRAIN LIVER PERC ([facility] HX)     klebsiella on liver pa states    EYE SURGERY     HYDROCELE EXCISION / REPAIR     radiokeratotomy     RETINAL DETACHMENT  SURGERY      Current Medications: Current Meds  Medication Sig   acetaminophen (TYLENOL) 325 MG tablet Take 2 tablets (650 mg total) by mouth every 6 (six) hours as needed for mild pain.   ALPRAZolam (XANAX) 0.5 MG tablet Take 1 tablet (0.5 mg total) by mouth 2 (two) times daily as needed for anxiety.   amphetamine-dextroamphetamine (ADDERALL XR) 20 MG 24 hr capsule Take 1 Capsules by mouth every day prn   aspirin EC 81 MG tablet Take 81 mg by mouth in the morning and at bedtime. Swallow whole.   Continuous Blood Gluc Sensor (FREESTYLE LIBRE 3 SENSOR) MISC 1 applicator by Does not apply route every 14 (fourteen) days. Use to check glucose continuously   hydrochlorothiazide (HYDRODIURIL) 25 MG tablet TAKE 1 TABLET(25 MG) BY MOUTH DAILY (Patient taking differently: Take 25 mg by mouth daily.)   insulin glargine (LANTUS SOLOSTAR) 100 UNIT/ML Solostar Pen Inject 30-34 Units into the skin at bedtime.   insulin regular (NOVOLIN R RELION) 100 units/mL injection Inject 0.16-0.2 mLs (16-20 Units total) into the skin 3 (three) times daily before meals. NO FURTHER REFILLS WITHOUT APPOINTMENT (Patient taking differently: Inject 16-20 Units into the skin 3 (three) times daily before meals.)   Insulin Syringe-Needle U-100 (RELION INSULIN SYRINGE) 31G X 15/64" 0.5 ML MISC Use to inject insulin 3 times  a day. NO REFILLS WITHOUT APPOINTMENT   Lancets (ONETOUCH ULTRASOFT) lancets Use to check blood sugar 3 times a day   lovastatin (MEVACOR) 20 MG tablet Take 1 tablet (20 mg total) by mouth at bedtime.   magnesium chloride (SLOW-MAG) 64 MG TBEC SR tablet Take 1 tablet (64 mg total) by mouth daily.   metFORMIN (GLUCOPHAGE) 1000 MG tablet TAKE 1 TABLET BY MOUTH TWICE DAILY. NO FURTHER REFILLS WITHOUT APPOINTMENT (Patient taking differently: Take 500 mg by mouth 2 (two) times daily with a meal.)   metoprolol tartrate (LOPRESSOR) 100 MG tablet TAKE 1 TABLET(100 MG) BY MOUTH TWICE DAILY (Patient taking differently: Take  100 mg by mouth 2 (two) times daily.)   omeprazole (PRILOSEC) 20 MG capsule Take 1 capsule (20 mg total) by mouth in the morning. TAKE 1 CAPSULE(20 MG) BY MOUTH DAILY Strength: 20 mg   ramipril (ALTACE) 10 MG capsule Take 1 capsule (10 mg total) by mouth 2 (two) times daily.   Semaglutide, 1 MG/DOSE, (OZEMPIC, 1 MG/DOSE,) 4 MG/3ML SOPN Inject 1 mg into the skin once a week.     Allergies:   Patient has no known allergies.   Social History   Socioeconomic History   Marital status: Married    Spouse name: Not on file   Number of children: 2   Years of education: Not on file   Highest education level: Not on file  Occupational History   Occupation: Ship broker, 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AC, Architect  Tobacco Use   Smoking status: Never   Smokeless tobacco: Never  Vaping Use   Vaping Use: Never used  Substance and Sexual Activity   Alcohol use: Yes    Alcohol/week: 1.0 standard drink    Types: 1 Cans of beer per week    Comment: socially    Drug use: No   Sexual activity: Yes    Comment: not asked  Other Topics Concern   Not on file  Social History Narrative   2 children + 2 step children ---    Has a part time job, Ship broker    Exercise swimming   Social Determinants of Health   Financial Resource Strain: Not on file  Food Insecurity: Not on file  Transportation Needs: Not on file  Physical Activity: Not on file  Stress: Not on file  Social Connections: Not on file     Family History: The patient's family history includes Asthma in his mother; Colon polyps in his father; Diabetes in his father; Hypertension in his father and mother. There is no history of Prostate cancer.  ROS:   Please see the history of present illness.    He has been physically and active.  Plans to get back to swimming and walking.  Has purchased a membership at [employer].  All other systems reviewed and are negative.  EKGs/Labs/Other Studies Reviewed:    The following studies were reviewed  today:  Non Cardiac CT scan December 2022: IMPRESSION: 1. Diffuse fat stranding in the posterior upper mediastinum, seemingly centered around the upper esophagus. No definite evidence of extraluminal air. These findings may represent posttraumatic changes. 2. Heavy calcific atherosclerotic disease of the coronary arteries. 3. Milder calcific atherosclerotic disease of the aorta.   Aortic Atherosclerosis (ICD10-I70.0).  2D Doppler echocardiogram 2017:  Study Conclusions  - Left ventricle: The cavity size was normal. Wall thickness was    increased in a pattern of mild LVH. Systolic function was normal.    The estimated  ejection fraction was in the range of 55% to 60%.    Wall motion was normal; there were no regional wall motion    abnormalities. Doppler parameters are consistent with abnormal    left ventricular relaxation (grade 1 diastolic dysfunction). The    E/e&' ratio is between 8-15, suggesting indeterminate LV filling    pressure.  - Mitral valve: Calcified annulus. There was trivial regurgitation.  - Left atrium: The atrium was normal in size.  - Tricuspid valve: There was trivial regurgitation.  - Pulmonary arteries: PA peak pressure: 21 mm Hg (S).  - Inferior vena cava: The vessel was normal in size. The    respirophasic diameter changes were in the normal range (= 50%),    consistent with normal central venous pressure.   Impressions:   - LVEF 55-60%, mild LVH, normal wall motion, diastolic dysfunction,    indeterminate LV filling pressure, normal LA size, trivial MR,    TR, normal RVSP.   EKG:  EKG normal sinus rhythm, normal PR interval, left anterior hemiblock.  Recent Labs: 04/25/2021: ALT 15; BUN 13; Creatinine, Ser 0.87; Hemoglobin 14.1; Platelets 271; Potassium 3.1; Sodium 134  Recent Lipid Panel    Component Value Date/Time   CHOL 144 07/31/2020 1630   TRIG 186 (H) 07/31/2020 1630   TRIG 136 05/09/2006 1426   HDL 46 07/31/2020 1630   CHOLHDL 3.1  07/31/2020 1630   CHOLHDL 3.6 11/24/2015 0928   VLDL 30 11/24/2015 0928   LDLCALC 67 07/31/2020 1630    Physical Exam:    VS:  BP 122/72    Pulse 82    Ht 5\' 10"  (1.778 m)    Wt 200 lb 3.2 oz (90.8 kg)    SpO2 96%    BMI 28.73 kg/m     Wt Readings from Last 3 Encounters:  05/29/21 200 lb 3.2 oz (90.8 kg)  05/12/21 201 lb (91.2 kg)  05/08/21 196 lb 9.6 oz (89.2 kg)     GEN: Appears slightly older than actual age.  Probably because of his... No acute distress HEENT: Normal NECK: No JVD. LYMPHATICS: No lymphadenopathy CARDIAC: No murmur. RRR no gallop, or edema. VASCULAR: normal Pulses. No bruits. RESPIRATORY:  Clear to auscultation without rales, wheezing or rhonchi  ABDOMEN: Soft, non-tender, non-distended, No pulsatile mass, MUSCULOSKELETAL: No deformity  SKIN: Warm and dry NEUROLOGIC:  Alert and oriented x 3 PSYCHIATRIC:  Normal affect   ASSESSMENT:    1. Coronary artery calcification seen on CAT scan   2. Essential hypertension   3. Mixed hyperlipidemia   4. Controlled type 2 diabetes mellitus with other circulatory complication, with long-term current use of insulin ([place])   5. History of heart failure    PLAN:    In order of problems listed above:  Asymptomatic coronary disease.  With "heavy calcification of coronary arteries" we should establish a functional baseline for future reference.  Stress myocardial perfusion imaging is ordered especially given that the patient is starting to ramp up physical activity. Excellent control with target less than 130/80 mmHg is being achieved on the current regimen of metoprolol tartrate, HydroDIURIL, and Altace.  These therapies were started when he had "heart failure" and have been taken religiously since that time. Currently on Mevacor 20 mg/day.  Coinciding LDL is 67 in March 2022.  Target is less than 70. Hemoglobin A1c was 8.30 March 2021.  Consider adding SGLT2 therapy to his regimen.  He is on semaglutide.  Also takes  insulin and metformin.  The myocardial perfusion imaging study will help [place] to have a wall motion reassessment of LV systolic function given prior history of systolic heart failure.   Overall education and awareness concerning secondary risk prevention was discussed in detail: LDL less than 70, hemoglobin A1c less than 7, blood pressure target less than 130/80 mmHg, >150 minutes of moderate aerobic activity per week, avoidance of smoking, weight control (via diet and exercise), and continued surveillance/management of/for obstructive sleep apnea.    Medication Adjustments/Labs and Tests Ordered: Current medicines are reviewed at length with the patient today.  Concerns regarding medicines are outlined above.  Orders Placed This Encounter  Procedures   Cardiac Stress Test: Informed Consent Details: Physician/Practitioner Attestation; Transcribe to consent form and obtain patient signature   Myocardial Perfusion Imaging   EKG 12-Lead   No orders of the defined types were placed in this encounter.   Patient Instructions  Medication Instructions:  Your physician recommends that you continue on your current medications as directed. Please refer to the Current Medication list given to you today.  *If you need a refill on your cardiac medications before your next appointment, please call your pharmacy*   Lab Work: None today If you have labs (blood work) drawn today and your tests are completely normal, you will receive your results only by: [place] (if you have MyChart) OR A paper copy in the mail If you have any lab test that is abnormal or we need to change your treatment, we will call you to review the results.   Testing/Procedures: Your physician has requested that you have en exercise stress myoview. For further information please visit [email or web address]. Please follow instruction sheet, as given.    Follow-Up: As needed.  Provider:   Aurora Mask III      Signed, Sinclair Grooms, MD  05/29/2021 12:40 PM    [place]

## 2021-06-05 ENCOUNTER — Other Ambulatory Visit: Payer: Self-pay | Admitting: Pulmonary Disease

## 2021-06-05 LAB — SARS CORONAVIRUS 2 (TAT 6-24 HRS): SARS Coronavirus 2: NEGATIVE

## 2021-06-07 ENCOUNTER — Encounter: Payer: Self-pay | Admitting: Pulmonary Disease

## 2021-06-08 ENCOUNTER — Encounter (HOSPITAL_COMMUNITY): Payer: Self-pay | Admitting: Pulmonary Disease

## 2021-06-08 ENCOUNTER — Telehealth: Payer: Self-pay | Admitting: Pulmonary Disease

## 2021-06-08 ENCOUNTER — Other Ambulatory Visit: Payer: Self-pay

## 2021-06-08 NOTE — Telephone Encounter (Signed)
See telephone encounter from 06/08/21. Patient is aware of instructions.

## 2021-06-08 NOTE — Anesthesia Preprocedure Evaluation (Addendum)
Anesthesia Evaluation  Patient identified by MRN, date of birth, ID band Patient awake    Reviewed: Allergy & Precautions, NPO status , Patient's Chart, lab work & pertinent test results  Airway Mallampati: II  TM Distance: >3 FB Neck ROM: Full    Dental  (+) Dental Advisory Given   Pulmonary  Pulmonary nodule   breath sounds clear to auscultation       Cardiovascular METS: 5 - 7 Mets hypertension, Pt. on medications and Pt. on home beta blockers + Peripheral Vascular Disease   Rhythm:Regular Rate:Normal     Neuro/Psych negative neurological ROS     GI/Hepatic Neg liver ROS, hiatal hernia,   Endo/Other  diabetes  Renal/GU negative Renal ROS     Musculoskeletal   Abdominal   Peds  Hematology  (+) anemia ,   Anesthesia Other Findings   Reproductive/Obstetrics                            Anesthesia Physical Anesthesia Plan  ASA: 2  Anesthesia Plan: General   Post-op Pain Management: Minimal or no pain anticipated and Tylenol PO (pre-op)   Induction:   PONV Risk Score and Plan: 2 and Dexamethasone, Ondansetron and Treatment may vary due to age or medical condition  Airway Management Planned: Oral ETT  Additional Equipment: None  Intra-op Plan:   Post-operative Plan: Extubation in OR  Informed Consent: I have reviewed the patients History and Physical, chart, labs and discussed the procedure including the risks, benefits and alternatives for the proposed anesthesia with the patient or authorized representative who has indicated his/her understanding and acceptance.     Dental advisory given  Plan Discussed with: CRNA  Anesthesia Plan Comments: ( )       Anesthesia Quick Evaluation
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
## 2021-06-08 NOTE — Telephone Encounter (Signed)
Called and spoke with Lattie Haw. She stated that the patient is scheduled for a bronch tomorrow and does not remember getting any information about the appt. I advised her that he will need to be at [facility] by 530am. She is aware that he is can take his medications in the morning with the minimal amount of water. No food after midnight.   She verbalized understanding.   Nothing further needed at time of call.

## 2021-06-08 NOTE — Progress Notes (Signed)
PCP - Dr. Corliss Parish Cardiologist - Dr. Daneen Schick    Chest x-ray - 04/25/21 EKG - 05/29/21 Stress Test - "Some time ago"  Aspirin Instructions: Stop today  ERAS Protcol - N/A  COVID TEST- 06/05/21  Anesthesia review: N  Patient verbally denies any shortness of breath, fever, cough and chest pain during phone call   -------------  SDW INSTRUCTIONS given:  Your procedure is scheduled on 06/09/21.  Report to [facility] Main Entrance "A" at 0530 A.M., and check in at the Admitting office.  Call this number if you have problems the morning of surgery:  [phone]   Remember:  Do not eat after midnight the night before your surgery  Take these medicines the morning of surgery with A SIP OF WATER  Metoprolol, prilosec Tylenol and xanax if needed  Take 8 units of Lantus tonight   .** PLEASE check your blood sugar the morning of your surgery when you wake up and every 2 hours until you get to the Short Stay unit.  If your blood sugar is less than 70 mg/dL, you will need to treat for low blood sugar: Do not take insulin. Treat a low blood sugar (less than 70 mg/dL) with  cup of clear juice (cranberry or apple), 4 glucose tablets, OR glucose gel. Recheck blood sugar in 15 minutes after treatment (to make sure it is greater than 70 mg/dL). If your blood sugar is not greater than 70 mg/dL on recheck, call [phone] for further instructions.   As of today, STOP taking any Aspirin (unless otherwise instructed by your surgeon) Aleve, Naproxen, Ibuprofen, Motrin, Advil, Goody's, BC's, all herbal medications, fish oil, and all vitamins.                      Do not wear jewelry, make up, or nail polish            Do not wear lotions, powders, perfumes/colognes, or deodorant.            Do not shave 48 hours prior to surgery.  Men may shave face and neck.            Do not bring valuables to the hospital.            [facility] is not responsible for any belongings or  valuables.  Do NOT Smoke (Tobacco/Vaping) or drink Alcohol 24 hours prior to your procedure If you use a CPAP at night, you may bring all equipment for your overnight stay.   Contacts, glasses, dentures or bridgework may not be worn into surgery.      For patients admitted to the hospital, discharge time will be determined by your treatment team.   Patients discharged the day of surgery will not be allowed to drive home, and someone needs to stay with them for 24 hours.    Special instructions:   [place]- Preparing For Surgery  Before surgery, you can play an important role. Because skin is not sterile, your skin needs to be as free of germs as possible. You can reduce the number of germs on your skin by washing with CHG (chlorahexidine gluconate) Soap before surgery.  CHG is an antiseptic cleaner which kills germs and bonds with the skin to continue killing germs even after washing.    Oral Hygiene is also important to reduce your risk of infection.  Remember - BRUSH YOUR TEETH THE MORNING OF SURGERY WITH YOUR REGULAR TOOTHPASTE  Please do not  use if you have an allergy to CHG or antibacterial soaps. If your skin becomes reddened/irritated stop using the CHG.  Do not shave (including legs and underarms) for at least 48 hours prior to first CHG shower. It is OK to shave your face.  Please follow these instructions carefully.   Shower the NIGHT BEFORE SURGERY and the MORNING OF SURGERY with DIAL Soap.   Pat yourself dry with a CLEAN TOWEL.  Wear CLEAN PAJAMAS to bed the night before surgery  Place CLEAN SHEETS on your bed the night of your first shower and DO NOT SLEEP WITH PETS.   Day of Surgery: Please shower morning of surgery  Wear Clean/Comfortable clothing the morning of surgery Do not apply any deodorants/lotions.   Remember to brush your teeth WITH YOUR REGULAR TOOTHPASTE.   Questions were answered. Patient verbalized understanding of instructions.

## 2021-06-09 ENCOUNTER — Ambulatory Visit (HOSPITAL_COMMUNITY): Payer: 59 | Admitting: Vascular Surgery

## 2021-06-09 ENCOUNTER — Encounter (HOSPITAL_COMMUNITY): Payer: Self-pay | Admitting: Pulmonary Disease

## 2021-06-09 ENCOUNTER — Encounter (HOSPITAL_COMMUNITY)
Admission: RE | Disposition: A | Discharge status: Home / Self Care | Payer: Self-pay | Source: Home / Self Care | Attending: Pulmonary Disease

## 2021-06-09 ENCOUNTER — Ambulatory Visit (HOSPITAL_COMMUNITY): Payer: 59

## 2021-06-09 ENCOUNTER — Ambulatory Visit (HOSPITAL_COMMUNITY)
Admission: RE | Admit: 2021-06-09 | Discharge: 2021-06-09 | Disposition: A | Discharge status: Home / Self Care | Payer: 59 | Attending: Pulmonary Disease | Admitting: Pulmonary Disease

## 2021-06-09 ENCOUNTER — Other Ambulatory Visit: Payer: Self-pay

## 2021-06-09 DIAGNOSIS — F909 Attention-deficit hyperactivity disorder, unspecified type: Secondary | ICD-10-CM | POA: Insufficient documentation

## 2021-06-09 DIAGNOSIS — E119 Type 2 diabetes mellitus without complications: Secondary | ICD-10-CM | POA: Insufficient documentation

## 2021-06-09 DIAGNOSIS — I429 Cardiomyopathy, unspecified: Secondary | ICD-10-CM | POA: Insufficient documentation

## 2021-06-09 DIAGNOSIS — E785 Hyperlipidemia, unspecified: Secondary | ICD-10-CM | POA: Insufficient documentation

## 2021-06-09 DIAGNOSIS — Z8719 Personal history of other diseases of the digestive system: Secondary | ICD-10-CM | POA: Insufficient documentation

## 2021-06-09 DIAGNOSIS — I739 Peripheral vascular disease, unspecified: Secondary | ICD-10-CM | POA: Diagnosis not present

## 2021-06-09 DIAGNOSIS — R911 Solitary pulmonary nodule: Secondary | ICD-10-CM | POA: Diagnosis not present

## 2021-06-09 DIAGNOSIS — Z419 Encounter for procedure for purposes other than remedying health state, unspecified: Secondary | ICD-10-CM

## 2021-06-09 DIAGNOSIS — Z9889 Other specified postprocedural states: Secondary | ICD-10-CM

## 2021-06-09 DIAGNOSIS — I10 Essential (primary) hypertension: Secondary | ICD-10-CM | POA: Insufficient documentation

## 2021-06-09 HISTORY — PX: BRONCHIAL WASHINGS: SHX5105

## 2021-06-09 HISTORY — PX: VIDEO BRONCHOSCOPY WITH RADIAL ENDOBRONCHIAL ULTRASOUND: SHX6849

## 2021-06-09 HISTORY — PX: BRONCHIAL BIOPSY: SHX5109

## 2021-06-09 HISTORY — PX: BRONCHIAL NEEDLE ASPIRATION BIOPSY: SHX5106

## 2021-06-09 LAB — BASIC METABOLIC PANEL
Anion gap: 11 (ref 5–15)
BUN: 15 mg/dL (ref 6–20)
CO2: 24 mmol/L (ref 22–32)
Calcium: 8.6 mg/dL — ABNORMAL LOW (ref 8.9–10.3)
Chloride: 103 mmol/L (ref 98–111)
Creatinine, Ser: 0.78 mg/dL (ref 0.61–1.24)
GFR, Estimated: >60 mL/min (ref >60)
Glucose, Bld: 182 mg/dL — ABNORMAL HIGH (ref 70–99)
Potassium: 3.6 mmol/L (ref 3.5–5.1)
Sodium: 138 mmol/L (ref 135–145)

## 2021-06-09 LAB — CBC
HCT: 44.6 % (ref 39.0–52.0)
Hemoglobin: 14.9 g/dL (ref 13.0–17.0)
MCH: 31 pg (ref 26.0–34.0)
MCHC: 33.4 g/dL (ref 30.0–36.0)
MCV: 92.7 fL (ref 80.0–100.0)
Platelets: 258 10*3/uL (ref 150–400)
RBC: 4.81 MIL/uL (ref 4.22–5.81)
RDW: 13.2 % (ref 11.5–15.5)
WBC: 10.3 10*3/uL (ref 4.0–10.5)
nRBC: 0 % (ref 0.0–0.2)

## 2021-06-09 LAB — GLUCOSE, CAPILLARY
Glucose-Capillary: 183 mg/dL — ABNORMAL HIGH (ref 70–99)
Glucose-Capillary: 157 mg/dL — ABNORMAL HIGH (ref 70–99)

## 2021-06-09 SURGERY — BRONCHOSCOPY, WITH BIOPSY USING ELECTROMAGNETIC NAVIGATION
Anesthesia: General | Laterality: Left

## 2021-06-09 MED ORDER — PHENYLEPHRINE HCL-NACL 20-0.9 MG/250ML-% IV SOLN
INTRAVENOUS | Status: DC | PRN
  Administered 2021-06-09: 40 ug/min via INTRAVENOUS

## 2021-06-09 MED ORDER — DEXAMETHASONE SODIUM PHOSPHATE 10 MG/ML IJ SOLN
INTRAMUSCULAR | Status: DC | PRN
Start: 2021-06-09 — End: 2021-06-09
  Administered 2021-06-09: 10 mg via INTRAVENOUS

## 2021-06-09 MED ORDER — SUGAMMADEX SODIUM 200 MG/2ML IV SOLN
INTRAVENOUS | Status: DC | PRN
  Administered 2021-06-09: 200 mg via INTRAVENOUS

## 2021-06-09 MED ORDER — ROCURONIUM BROMIDE 10 MG/ML (PF) SYRINGE
PREFILLED_SYRINGE | INTRAVENOUS | Status: DC | PRN
  Administered 2021-06-09: 50 mg via INTRAVENOUS

## 2021-06-09 MED ORDER — MIDAZOLAM HCL 2 MG/2ML IJ SOLN
INTRAMUSCULAR | Status: DC | PRN
  Administered 2021-06-09: 2 mg via INTRAVENOUS

## 2021-06-09 MED ORDER — LACTATED RINGERS IV SOLN: INTRAVENOUS | Status: DC

## 2021-06-09 MED ORDER — CHLORHEXIDINE GLUCONATE 0.12 % MT SOLN
OROMUCOSAL | Status: AC
  Filled 2021-06-09: qty 15

## 2021-06-09 MED ORDER — ONDANSETRON HCL 4 MG/2ML IJ SOLN
INTRAMUSCULAR | Status: DC | PRN
  Administered 2021-06-09: 4 mg via INTRAVENOUS

## 2021-06-09 MED ORDER — ACETAMINOPHEN 500 MG PO TABS
1000.0000 mg | ORAL_TABLET | Freq: Once | ORAL | Status: AC
  Administered 2021-06-09: 1000 mg via ORAL
  Filled 2021-06-09: qty 2

## 2021-06-09 MED ORDER — FENTANYL CITRATE (PF) 100 MCG/2ML IJ SOLN: 25.0000 ug | INTRAMUSCULAR | Status: DC | PRN

## 2021-06-09 MED ORDER — PROPOFOL 10 MG/ML IV BOLUS
INTRAVENOUS | Status: DC | PRN
  Administered 2021-06-09: 50 mg via INTRAVENOUS
  Administered 2021-06-09: 150 mg via INTRAVENOUS

## 2021-06-09 MED ORDER — LIDOCAINE 2% (20 MG/ML) 5 ML SYRINGE
INTRAMUSCULAR | Status: DC | PRN
  Administered 2021-06-09: 60 mg via INTRAVENOUS

## 2021-06-09 MED ORDER — FENTANYL CITRATE (PF) 250 MCG/5ML IJ SOLN
INTRAMUSCULAR | Status: DC | PRN
  Administered 2021-06-09 (×2): 50 ug via INTRAVENOUS

## 2021-06-09 NOTE — Discharge Instructions (Signed)
Flexible Bronchoscopy, Care After This sheet gives you information about how to care for yourself after your test. Your doctor may also give you more specific instructions. If you have problems or questions, contact your doctor. Follow these instructions at home: Eating and drinking Do not eat or drink anything (not even water) for 2 hours after your test, or until your numbing medicine (local anesthetic) wears off. When your numbness is gone and your cough and gag reflexes have come back, you may: Eat only soft foods. Slowly drink liquids. The day after the test, go back to your normal diet. Driving Do not drive for 24 hours if you were given a medicine to help you relax (sedative). Do not drive or use heavy machinery while taking prescription pain medicine. General instructions  Take over-the-counter and prescription medicines only as told by your doctor. Return to your normal activities as told. Ask what activities are safe for you. Do not use any products that have nicotine or tobacco in them. This includes cigarettes and e-cigarettes. If you need help quitting, ask your doctor. Keep all follow-up visits as told by your doctor. This is important. It is very important if you had a tissue sample (biopsy) taken. Get help right away if: You have shortness of breath that gets worse. You get light-headed. You feel like you are going to pass out (faint). You have chest pain. You cough up: More than a little blood. More blood than before. Summary Do not eat or drink anything (not even water) for 2 hours after your test, or until your numbing medicine wears off. Do not use cigarettes. Do not use e-cigarettes. Get help right away if you have chest pain.  This information is not intended to replace advice given to you by your health care provider. Make sure you discuss any questions you have with your health care provider. Document Released: 03/07/2009 Document Revised: 04/22/2017 Document  Reviewed: 05/28/2016 Elsevier Patient Education  2020 [employer].

## 2021-06-09 NOTE — Anesthesia Procedure Notes (Signed)
Procedure Name: Intubation Date/Time: 06/09/2021 7:34 AM Performed by: Erick Colace, CRNA Pre-anesthesia Checklist: Patient identified, Emergency Drugs available, Suction available and Patient being monitored Patient Re-evaluated:Patient Re-evaluated prior to induction Oxygen Delivery Method: Circle system utilized Preoxygenation: Pre-oxygenation with 100% oxygen Induction Type: IV induction Ventilation: Mask ventilation without difficulty Laryngoscope Size: Mac and 4 Grade View: Grade II Tube type: Oral Tube size: 8.5 mm Number of attempts: 1 Airway Equipment and Method: Stylet and Oral airway Placement Confirmation: ETT inserted through vocal cords under direct vision, positive ETCO2 and breath sounds checked- equal and bilateral Secured at: 23 cm Tube secured with: Tape Dental Injury: Teeth and Oropharynx as per pre-operative assessment

## 2021-06-09 NOTE — Transfer of Care (Signed)
Immediate Anesthesia Transfer of Care Note  Patient: Raymond Pugh  Procedure(s) Performed: ROBOTIC ASSISTED NAVIGATIONAL BRONCHOSCOPY (Left) VIDEO BRONCHOSCOPY WITH RADIAL ENDOBRONCHIAL ULTRASOUND BRONCHIAL NEEDLE ASPIRATION BIOPSIES BRONCHIAL BIOPSIES BRONCHIAL WASHINGS  Patient Location: PACU  Anesthesia Type:General  Level of Consciousness: awake  Airway & Oxygen Therapy: Patient Spontanous Breathing and Patient connected to face mask oxygen  Post-op Assessment: Report given to RN and Post -op Vital signs reviewed and stable  Post vital signs: Reviewed and stable  Last Vitals:  Vitals Value Taken Time  BP 156/75 06/09/21 0840  Temp    Pulse 91 06/09/21 0841  Resp 22 06/09/21 0841  SpO2 100 % 06/09/21 0841  Vitals shown include unvalidated device data.  Last Pain:  Vitals:   06/09/21 0646  TempSrc:   PainSc: 0-No pain         Complications: No notable events documented.
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
## 2021-06-09 NOTE — Anesthesia Postprocedure Evaluation (Signed)
Anesthesia Post Note  Patient: Raymond Pugh  Procedure(s) Performed: ROBOTIC ASSISTED NAVIGATIONAL BRONCHOSCOPY (Left) VIDEO BRONCHOSCOPY WITH RADIAL ENDOBRONCHIAL ULTRASOUND BRONCHIAL NEEDLE ASPIRATION BIOPSIES BRONCHIAL BIOPSIES BRONCHIAL WASHINGS     Patient location during evaluation: PACU Anesthesia Type: General Level of consciousness: awake and alert Pain management: pain level controlled Vital Signs Assessment: post-procedure vital signs reviewed and stable Respiratory status: spontaneous breathing, nonlabored ventilation, respiratory function stable and patient connected to nasal cannula oxygen Cardiovascular status: blood pressure returned to baseline and stable Postop Assessment: no apparent nausea or vomiting Anesthetic complications: no   No notable events documented.  Last Vitals:  Vitals:   06/09/21 0855 06/09/21 0910  BP: 126/70 131/72  Pulse: 91 92  Resp: 13 18  Temp:  (!) 36.3 C  SpO2: 93% 96%    Last Pain:  Vitals:   06/09/21 0910  TempSrc:   PainSc: 0-No pain                 Tiajuana Amass

## 2021-06-09 NOTE — Op Note (Signed)
Video Bronchoscopy with Robotic Assisted Bronchoscopic Navigation   Date of Operation: 06/09/2021   Pre-op Diagnosis: Left upper lobe pulmonary nodule  Post-op Diagnosis: Left upper lobe pulmonary nodule  Surgeon: Garner Nash, DO   Assistants: None   Anesthesia: General endotracheal anesthesia  Operation: Flexible video fiberoptic bronchoscopy with robotic assistance and biopsies.  Estimated Blood Loss: Minimal  Complications: None  Indications and History: Raymond Pugh is a 61 y.o. male with history of left upper lobe pulmonary nodule. The risks, benefits, complications, treatment options and expected outcomes were discussed with the patient.  The possibilities of pneumothorax, pneumonia, reaction to medication, pulmonary aspiration, perforation of a viscus, bleeding, failure to diagnose a condition and creating a complication requiring transfusion or operation were discussed with the patient who freely signed the consent.    Description of Procedure: The patient was seen in the Preoperative Area, was examined and was deemed appropriate to proceed.  The patient was taken to [facility] endoscopy room 3, identified as Raymond Pugh and the procedure verified as Flexible Video Fiberoptic Bronchoscopy.  A Time Out was held and the above information confirmed.   Prior to the date of the procedure a high-resolution CT scan of the chest was performed. Utilizing ION software program a virtual tracheobronchial tree was generated to allow the creation of distinct navigation pathways to the patient's parenchymal abnormalities. After being taken to the operating room general anesthesia was initiated and the patient  was orally intubated. The video fiberoptic bronchoscope was introduced via the endotracheal tube and a general inspection was performed which showed normal right and left lung anatomy, aspiration of the bilateral mainstems was completed to remove any remaining secretions. Robotic catheter  inserted into patient's endotracheal tube.   Target #1 left upper lobe pulmonary nodule: The distinct navigation pathways prepared prior to this procedure were then utilized to navigate to patient's lesion identified on CT scan. The robotic catheter was secured into place and the vision probe was withdrawn.  Lesion location was approximated using fluoroscopy, three-dimensional cone beam CT imaging, and radial endobronchial ultrasound for peripheral targeting. Under fluoroscopic guidance transbronchial needle aspirations and transbronchial forceps biopsies were performed to be sent for cytology and pathology. A bronchioalveolar lavage was performed in the left upper lobe and sent for microbiology.  At the end of the procedure a general airway inspection was performed and there was no evidence of active bleeding. The bronchoscope was removed.  The patient tolerated the procedure well. There was no significant blood loss and there were no obvious complications. A post-procedural chest x-ray is pending.  Samples Target #1: 2. Transbronchial Wang needle biopsies from left upper lobe 3. Transbronchial forceps biopsies from left upper lobe 4. Bronchoalveolar lavage from left upper lobe  Plans:  The patient will be discharged from the PACU to home when recovered from anesthesia and after chest x-ray is reviewed. We will review the cytology, pathology and microbiology results with the patient when they become available. Outpatient followup will be with Garner Nash, DO.   Garner Nash, DO [facility] 06/09/2021 8:31 AM

## 2021-06-10 ENCOUNTER — Encounter: Payer: Self-pay | Admitting: Pulmonary Disease

## 2021-06-10 LAB — ACID FAST SMEAR (AFB, MYCOBACTERIA): Acid Fast Smear: NEGATIVE

## 2021-06-10 NOTE — Telephone Encounter (Signed)
Please see patient mychart message below  I thought I would see you in post op yesterday before I left.Marland KitchenMarland KitchenI know you were busy.Marland KitchenMarland KitchenJust wondering your take on how it all went...how did the Nodule and the Specimen look.Marland KitchenMarland KitchenWhat if anything should I be focusing on.Marland KitchenMarland KitchenI did receive the check out information and am following that but just checking on any additional thought s you might have for me.Marland KitchenMarland KitchenMarland KitchenThanks      Raymond Pugh [phone]  [email or web address]

## 2021-06-11 LAB — FUNGUS STAIN

## 2021-06-11 LAB — FUNGAL STAIN REFLEX

## 2021-06-11 NOTE — Telephone Encounter (Signed)
New mychart message sent by pt: [facility] (supporting Icard, Bradley L, DO) 21 hours ago (7:21 PM)   CP So Two tests have appeared in my test results chart here.Marland KitchenMarland KitchenMarland KitchenCan someone tell me exactly what they mean? Am I negative? Are there more test results coming? Please advise...Marland KitchenMarland KitchenThanks Jarman Litton   [email or web address] [phone].Marland Kitchen...    Dr. Valeta Harms, please advise.

## 2021-06-12 ENCOUNTER — Encounter (HOSPITAL_COMMUNITY): Payer: Self-pay | Admitting: Pulmonary Disease

## 2021-06-15 ENCOUNTER — Telehealth: Payer: Self-pay | Admitting: Pulmonary Disease

## 2021-06-15 DIAGNOSIS — R911 Solitary pulmonary nodule: Secondary | ICD-10-CM

## 2021-06-15 LAB — AEROBIC/ANAEROBIC CULTURE W GRAM STAIN (SURGICAL/DEEP WOUND): Gram Stain: NONE SEEN

## 2021-06-15 NOTE — Telephone Encounter (Signed)
PCCM:  I called and reviewed the patients bronchoscopy results.  Tissue biopsies negative for malignancy.  Culture results no growth to date.  Repeat noncontrasted CT scan of the chest in 6 months.  [facility] 06/15/2021 4:53 PM

## 2021-06-17 ENCOUNTER — Encounter: Payer: Self-pay | Admitting: Internal Medicine

## 2021-06-17 ENCOUNTER — Telehealth: Payer: Self-pay | Admitting: Internal Medicine

## 2021-06-17 DIAGNOSIS — Z794 Long term (current) use of insulin: Secondary | ICD-10-CM

## 2021-06-17 MED ORDER — DEXCOM G6 RECEIVER DEVI: 0 refills | Status: DC

## 2021-06-17 MED ORDER — DEXCOM G6 TRANSMITTER MISC: 3 refills | Status: DC

## 2021-06-17 MED ORDER — DEXCOM G6 SENSOR MISC: 3 refills | Status: DC

## 2021-06-17 NOTE — Telephone Encounter (Signed)
Rx sent to preferred pharmacy.

## 2021-06-17 NOTE — Telephone Encounter (Signed)
Pts spouse called in stating that pt is needing to have Dexcom G6 meter to be called in due to the new insurance that he has does not cover the current meter that he has.  The current meter is not working well and would like to see if the Dexcom G6 can be called in as soon as possible.  Pharm:  [facility] on [address].

## 2021-06-18 ENCOUNTER — Other Ambulatory Visit (HOSPITAL_COMMUNITY): Payer: Self-pay

## 2021-06-18 ENCOUNTER — Telehealth: Payer: Self-pay

## 2021-06-18 NOTE — Telephone Encounter (Signed)
Patient Advocate Encounter   Received notification from patient's pharmacy that prior authorization for Dexcom G6 Sensors is required by his/her [employer].   PA submitted on 06/18/21  Key#: [id number]  Status is pending    [facility] will continue to follow:  Patient Advocate Fax: [phone]

## 2021-06-18 NOTE — Telephone Encounter (Signed)
Patient Advocate Encounter   Received notification from patient's pharmacy that prior authorization for Dexcom G6 Receiver is required by his/her [employer]   PA submitted on 06/18/21  Key#: [id number]   Status is pending    [facility] will continue to follow:  Patient Advocate Fax: [phone]

## 2021-06-18 NOTE — Telephone Encounter (Signed)
Patient Advocate Encounter   Received notification from patient's pharmacy that prior authorization for Dexcom G6 Transmitter is required by his/her [employer].   PA submitted on 06/18/21  Key#: [id number]  Status is pending    [facility] will continue to follow:  Patient Advocate Fax: [phone]

## 2021-06-19 ENCOUNTER — Ambulatory Visit: Payer: 59 | Admitting: Pulmonary Disease

## 2021-06-19 ENCOUNTER — Other Ambulatory Visit (HOSPITAL_COMMUNITY): Payer: Self-pay

## 2021-06-19 NOTE — Telephone Encounter (Signed)
Patient Advocate Encounter  Prior Authorization for [employer], Receiver & Sensors has been approved.    PA# [id number]  Effective dates: 06/18/21 through 06/18/22  Per Test Claim Patients co-pay is $45 each   Spoke with Pharmacy to Process.  Patient Advocate Fax: [phone]

## 2021-06-20 ENCOUNTER — Encounter (HOSPITAL_BASED_OUTPATIENT_CLINIC_OR_DEPARTMENT_OTHER): Payer: Self-pay

## 2021-06-20 ENCOUNTER — Emergency Department (HOSPITAL_BASED_OUTPATIENT_CLINIC_OR_DEPARTMENT_OTHER): Payer: 59

## 2021-06-20 ENCOUNTER — Emergency Department (HOSPITAL_BASED_OUTPATIENT_CLINIC_OR_DEPARTMENT_OTHER)
Admission: EM | Admit: 2021-06-20 | Discharge: 2021-06-20 | Disposition: A | Discharge status: Home / Self Care | Payer: 59 | Attending: Emergency Medicine | Admitting: Emergency Medicine

## 2021-06-20 ENCOUNTER — Other Ambulatory Visit: Payer: Self-pay

## 2021-06-20 DIAGNOSIS — W268XXA Contact with other sharp object(s), not elsewhere classified, initial encounter: Secondary | ICD-10-CM | POA: Insufficient documentation

## 2021-06-20 DIAGNOSIS — Z7982 Long term (current) use of aspirin: Secondary | ICD-10-CM | POA: Diagnosis not present

## 2021-06-20 DIAGNOSIS — S90859A Superficial foreign body, unspecified foot, initial encounter: Secondary | ICD-10-CM

## 2021-06-20 DIAGNOSIS — S91341A Puncture wound with foreign body, right foot, initial encounter: Secondary | ICD-10-CM | POA: Insufficient documentation

## 2021-06-20 DIAGNOSIS — Z794 Long term (current) use of insulin: Secondary | ICD-10-CM | POA: Insufficient documentation

## 2021-06-20 DIAGNOSIS — S99921A Unspecified injury of right foot, initial encounter: Secondary | ICD-10-CM | POA: Diagnosis present

## 2021-06-20 DIAGNOSIS — E1140 Type 2 diabetes mellitus with diabetic neuropathy, unspecified: Secondary | ICD-10-CM | POA: Diagnosis not present

## 2021-06-20 DIAGNOSIS — Z23 Encounter for immunization: Secondary | ICD-10-CM | POA: Insufficient documentation

## 2021-06-20 DIAGNOSIS — Z7984 Long term (current) use of oral hypoglycemic drugs: Secondary | ICD-10-CM | POA: Diagnosis not present

## 2021-06-20 MED ORDER — TRAMADOL HCL 50 MG PO TABS
50.0000 mg | ORAL_TABLET | Freq: Four times a day (QID) | ORAL | 0 refills | Status: DC | PRN
Start: 2021-06-20 — End: 2021-07-24

## 2021-06-20 MED ORDER — CIPROFLOXACIN HCL 500 MG PO TABS: 500.0000 mg | ORAL_TABLET | Freq: Two times a day (BID) | ORAL | 0 refills | Status: DC

## 2021-06-20 MED ORDER — TRAMADOL HCL 50 MG PO TABS
50.0000 mg | ORAL_TABLET | Freq: Once | ORAL | Status: AC
  Administered 2021-06-20: 50 mg via ORAL
  Filled 2021-06-20: qty 1

## 2021-06-20 MED ORDER — TETANUS-DIPHTHERIA TOXOIDS TD 5-2 LFU IM INJ
0.5000 mL | INJECTION | Freq: Once | INTRAMUSCULAR | Status: AC
  Administered 2021-06-20: 0.5 mL via INTRAMUSCULAR
  Filled 2021-06-20: qty 0.5

## 2021-06-20 NOTE — ED Triage Notes (Signed)
Pt presents with a red and painful R foot. Pt is a diabetic with neuropathy and pt does not know what happened. Pt thinks he might have stepped on a nail because he found a hole in his shoe.

## 2021-06-20 NOTE — ED Provider Notes (Signed)
[facility] EMERGENCY DEPT Provider Note   CSN: [id number] Arrival date & time: 06/20/21  2148     History  Chief Complaint  Patient presents with   Foot Injury    PAXTEN APPELT is a 61 y.o. male.  60 year old male complains of possible foreign body to right foot.  States that he may have stepped on a piece of metal by 2 days ago.  Did note some bleeding from the foot this morning.  Denies any fever or chills.  Patient does have a history of diabetic neuropathy.  No other complaints      Home Medications Prior to Admission medications   Medication Sig Start Date End Date Taking? Authorizing Provider  acetaminophen (TYLENOL) 325 MG tablet Take 2 tablets (650 mg total) by mouth every 6 (six) hours as needed for mild pain. 04/26/21   Dwan Bolt, MD  ALPRAZolam Duanne Moron) 0.5 MG tablet Take 1 tablet (0.5 mg total) by mouth 2 (two) times daily as needed for anxiety. 07/31/20   Wendie Agreste, MD  amphetamine-dextroamphetamine (ADDERALL XR) 20 MG 24 hr capsule Take 1 Capsules by mouth every day prn Patient taking differently: Take 30 mg by mouth daily as needed (Focus). 11/24/15   Darlyne Russian, MD  aspirin EC 81 MG tablet Take 81 mg by mouth in the morning and at bedtime. Swallow whole.    [provider]  Continuous Blood Gluc Receiver (DEXCOM G6 RECEIVER) DEVI Use as instructed to check blood sugar 06/17/21   [name], MD  Continuous Blood Gluc Sensor (DEXCOM G6 SENSOR) MISC Use as instructed to check blood sugar change every 10 days 06/17/21   [name], MD  Continuous Blood Gluc Transmit (DEXCOM G6 TRANSMITTER) MISC Use as instructed. Change every 90 days 06/17/21   [name], MD  hydrochlorothiazide (HYDRODIURIL) 25 MG tablet TAKE 1 TABLET(25 MG) BY MOUTH DAILY Patient taking differently: Take 25 mg by mouth daily. 04/03/21   Wendie Agreste, MD  insulin glargine (LANTUS SOLOSTAR) 100 UNIT/ML Solostar Pen Inject 30-34 Units into  the skin at bedtime. Patient taking differently: Inject 16 Units into the skin at bedtime. 11/17/20   [name], MD  insulin regular (NOVOLIN R RELION) 100 units/mL injection Inject 0.16-0.2 mLs (16-20 Units total) into the skin 3 (three) times daily before meals. NO FURTHER REFILLS WITHOUT APPOINTMENT Patient taking differently: Inject 16-25 Units into the skin 3 (three) times daily before meals. Sliding scale 03/29/19   [name], MD  Insulin Syringe-Needle U-100 (RELION INSULIN SYRINGE) 31G X 15/64" 0.5 ML MISC Use to inject insulin 3 times a day. NO REFILLS WITHOUT APPOINTMENT 03/29/19   [name], MD  Lancets [facility] ULTRASOFT) lancets Use to check blood sugar 3 times a day 07/29/20   [name], MD  lovastatin (MEVACOR) 20 MG tablet Take 1 tablet (20 mg total) by mouth at bedtime. 07/31/20   Wendie Agreste, MD  metFORMIN (GLUCOPHAGE) 1000 MG tablet TAKE 1 TABLET BY MOUTH TWICE DAILY. NO FURTHER REFILLS WITHOUT APPOINTMENT Patient taking differently: Take 1,000 mg by mouth 2 (two) times daily with a meal. 07/04/20   [name], MD  metoprolol tartrate (LOPRESSOR) 100 MG tablet TAKE 1 TABLET(100 MG) BY MOUTH TWICE DAILY Patient taking differently: Take 100 mg by mouth 2 (two) times daily. 03/27/21   Wendie Agreste, MD  omeprazole (PRILOSEC) 20 MG capsule Take 1 capsule (20 mg total) by mouth in the morning. TAKE 1 CAPSULE(20 MG) BY MOUTH DAILY Strength:  20 mg 05/12/21   Esterwood, Amy S, PA-C  ramipril (ALTACE) 10 MG capsule Take 1 capsule (10 mg total) by mouth 2 (two) times daily. 07/31/20   Wendie Agreste, MD  Semaglutide, 1 MG/DOSE, (OZEMPIC, 1 MG/DOSE,) 4 MG/3ML SOPN Inject 1 mg into the skin once a week. 09/23/20   [name], MD      Allergies    Patient has no known allergies.    Review of Systems   Review of Systems  All other systems reviewed and are negative.  Physical Exam Updated Vital Signs There were no vitals taken for this  visit. Physical Exam Vitals and nursing note reviewed.  Constitutional:      General: He is not in acute distress.    Appearance: Normal appearance. He is well-developed. He is not toxic-appearing.  HENT:     Head: Normocephalic and atraumatic.  Eyes:     General: Lids are normal.     Conjunctiva/sclera: Conjunctivae normal.     Pupils: Pupils are equal, round, and reactive to light.  Neck:     Thyroid: No thyroid mass.     Trachea: No tracheal deviation.  Cardiovascular:     Rate and Rhythm: Normal rate and regular rhythm.     Heart sounds: Normal heart sounds. No murmur heard.   No gallop.  Pulmonary:     Effort: Pulmonary effort is normal. No respiratory distress.     Breath sounds: Normal breath sounds. No stridor. No decreased breath sounds, wheezing, rhonchi or rales.  Abdominal:     General: There is no distension.     Palpations: Abdomen is soft.     Tenderness: There is no abdominal tenderness. There is no rebound.  Musculoskeletal:        General: No tenderness. Normal range of motion.     Cervical back: Normal range of motion and neck supple.     Comments: Puncture wound noted at bottom of foot at the base between the second and and third toes.  No active drainage appreciated.  No surrounding erythema  Skin:    General: Skin is warm and dry.     Findings: No abrasion or rash.  Neurological:     Mental Status: He is alert and oriented to person, place, and time. Mental status is at baseline.     GCS: GCS eye subscore is 4. GCS verbal subscore is 5. GCS motor subscore is 6.     Cranial Nerves: No cranial nerve deficit.     Sensory: No sensory deficit.     Motor: Motor function is intact.  Psychiatric:        Attention and Perception: Attention normal.        Speech: Speech normal.        Behavior: Behavior normal.    ED Results / Procedures / Treatments   Labs (all labs ordered are listed, but only abnormal results are displayed) Labs Reviewed - No data to  display  EKG None  Radiology No results found.  Procedures Procedures    Medications Ordered in ED Medications  tetanus & diphtheria toxoids (adult) (TENIVAC) injection 0.5 mL (has no administration in time range)    ED Course/ Medical Decision Making/ A&P                           Medical Decision Making Amount and/or Complexity of Data Reviewed Radiology: ordered.  Risk Prescription drug management.   Patient's tetanus  status updated here.  X-ray source foreign body is noted.  Patient be placed on Cipro for antibiotic prophylaxis and will be given referral to podiatry.         Final Clinical Impression(s) / ED Diagnoses Final diagnoses:  None    Rx / DC Orders ED Discharge Orders     None         Lacretia Leigh, MD 06/20/21 2255

## 2021-06-22 ENCOUNTER — Telehealth (HOSPITAL_COMMUNITY): Payer: Self-pay | Admitting: *Deleted

## 2021-06-22 ENCOUNTER — Ambulatory Visit (INDEPENDENT_AMBULATORY_CARE_PROVIDER_SITE_OTHER): Payer: 59

## 2021-06-22 ENCOUNTER — Other Ambulatory Visit: Payer: Self-pay

## 2021-06-22 ENCOUNTER — Ambulatory Visit (INDEPENDENT_AMBULATORY_CARE_PROVIDER_SITE_OTHER): Payer: 59 | Admitting: Podiatry

## 2021-06-22 ENCOUNTER — Other Ambulatory Visit (HOSPITAL_COMMUNITY): Payer: Self-pay

## 2021-06-22 DIAGNOSIS — L03031 Cellulitis of right toe: Secondary | ICD-10-CM | POA: Diagnosis not present

## 2021-06-22 DIAGNOSIS — L03115 Cellulitis of right lower limb: Secondary | ICD-10-CM

## 2021-06-22 DIAGNOSIS — M79671 Pain in right foot: Secondary | ICD-10-CM

## 2021-06-22 DIAGNOSIS — L03119 Cellulitis of unspecified part of limb: Secondary | ICD-10-CM | POA: Diagnosis not present

## 2021-06-22 DIAGNOSIS — L02619 Cutaneous abscess of unspecified foot: Secondary | ICD-10-CM | POA: Diagnosis not present

## 2021-06-22 DIAGNOSIS — L923 Foreign body granuloma of the skin and subcutaneous tissue: Secondary | ICD-10-CM | POA: Diagnosis not present

## 2021-06-22 DIAGNOSIS — L02611 Cutaneous abscess of right foot: Secondary | ICD-10-CM | POA: Diagnosis not present

## 2021-06-22 MED ORDER — DOXYCYCLINE HYCLATE 100 MG PO TABS: 100.0000 mg | ORAL_TABLET | Freq: Two times a day (BID) | ORAL | 1 refills | Status: DC

## 2021-06-22 NOTE — Telephone Encounter (Signed)
Patient given detailed instructions per Myocardial Perfusion Study Information Sheet for the test on 06/29/2021 at 8:00. Patient notified to arrive 15 minutes early and that it is imperative to arrive on time for appointment to keep from having the test rescheduled.  If you need to cancel or reschedule your appointment, please call the office within 24 hours of your appointment. . Patient verbalized understanding.Raymond Pugh

## 2021-06-22 NOTE — Progress Notes (Signed)
Subjective:   Patient ID: Raymond Pugh, male   DOB: 61 y.o.   MRN: [id number]   HPI Patient presents stating he stepped on something 4 days ago and his foot has been very sore and he does have diabetes tried to keep it under good control and is now on the pump but still does run an A1c of approximate 8.  Patient has started antibiotics yesterday from the emergency room and presents with this pathology.  Patient does not currently smoke and tries to be active   Review of Systems  All other systems reviewed and are negative.      Objective:  Physical Exam Vitals and nursing note reviewed.  Constitutional:      Appearance: He is well-developed.  Pulmonary:     Effort: Pulmonary effort is normal.  Musculoskeletal:        General: Normal range of motion.  Skin:    General: Skin is warm.  Neurological:     Mental Status: He is alert.    Neurovascular status was Pugh to be intact muscle strength was Pugh to be adequate.  Range of motion subtalar midtarsal joint adequate patient is Pugh to have redness around the second MPJ extending into the second digit right with no proximal edema erythema drainage from that point.  He states it has reduced somewhat in color since he started the antibiotics and he has a lot of pain on the bottom of the right foot with a small area which appears to be a penetration injury     Assessment:  Probability for infection associated with some form of penetration injury right plantar foot with diabetes is complicating factor     Plan:  8 H&P x-ray reviewed and today I did sterile prep anesthetic of the area and I went ahead and using sterile instrumentation I open the area up and Pugh a piece of glass within this area and no active drainage I flushed the area out I applied sterile dressing and it appears he is on Cipro so I added doxycycline to his regimen and I advised on soaks and elevation and gave strict instructions if any redness or any systemic signs  of infection were to occur he is to go straight to the emergency room.  I did explain the high risk of this with his diabetes and he is not currently running a fever with his temperature being normal and he is encouraged to take his temperature daily.  With the removal of the foreign body the flushing of the area and the antibiotic regimen I am relatively confident this will heal but because of his long-term diabetes there can be some issues  X-rays indicate that there appears to be no indication of significant foreign body formation even though we were able to remove the piece of glass from the

## 2021-06-26 ENCOUNTER — Other Ambulatory Visit: Payer: Self-pay | Admitting: Podiatry

## 2021-06-26 DIAGNOSIS — L03031 Cellulitis of right toe: Secondary | ICD-10-CM

## 2021-06-29 ENCOUNTER — Ambulatory Visit (HOSPITAL_COMMUNITY): Payer: 59

## 2021-06-30 NOTE — Telephone Encounter (Signed)
This is the patient Dr. Valeta Harms is planning on doing the robotic bronch on 07/14/2021. He just wanted to make sure you had the patient . Thanks

## 2021-07-01 LAB — CYTOLOGY - NON PAP

## 2021-07-01 NOTE — Telephone Encounter (Signed)
Spoke to Judson Roch - this is for another pt.

## 2021-07-03 ENCOUNTER — Other Ambulatory Visit: Payer: Self-pay

## 2021-07-03 ENCOUNTER — Ambulatory Visit (HOSPITAL_COMMUNITY): Payer: 59 | Attending: Cardiology

## 2021-07-03 DIAGNOSIS — Z794 Long term (current) use of insulin: Secondary | ICD-10-CM | POA: Diagnosis present

## 2021-07-03 DIAGNOSIS — I10 Essential (primary) hypertension: Secondary | ICD-10-CM | POA: Diagnosis present

## 2021-07-03 DIAGNOSIS — I2510 Atherosclerotic heart disease of native coronary artery without angina pectoris: Secondary | ICD-10-CM | POA: Diagnosis present

## 2021-07-03 DIAGNOSIS — E782 Mixed hyperlipidemia: Secondary | ICD-10-CM | POA: Diagnosis present

## 2021-07-03 DIAGNOSIS — E1159 Type 2 diabetes mellitus with other circulatory complications: Secondary | ICD-10-CM | POA: Diagnosis present

## 2021-07-03 LAB — MYOCARDIAL PERFUSION IMAGING
Base ST Depression (mm): 0 mm
LV dias vol: 85 mL (ref 62–150)
LV sys vol: 51 mL
Nuc Stress EF: 40 %
Peak HR: 127 {beats}/min
Rest HR: 77 {beats}/min
Rest Nuclear Isotope Dose: 10.2 mCi
SRS: 1
SSS: 0
ST Depression (mm): 0 mm
Stress Nuclear Isotope Dose: 30.7 mCi
TID: 0.88

## 2021-07-03 MED ORDER — TECHNETIUM TC 99M TETROFOSMIN IV KIT
30.7000 | PACK | Freq: Once | INTRAVENOUS | Status: AC | PRN
  Administered 2021-07-03: 30.7 via INTRAVENOUS
  Filled 2021-07-03: qty 31

## 2021-07-03 MED ORDER — TECHNETIUM TC 99M TETROFOSMIN IV KIT
10.2000 | PACK | Freq: Once | INTRAVENOUS | Status: AC | PRN
  Administered 2021-07-03: 10.2 via INTRAVENOUS
  Filled 2021-07-03: qty 11

## 2021-07-03 MED ORDER — REGADENOSON 0.4 MG/5ML IV SOLN
0.4000 mg | Freq: Once | INTRAVENOUS | Status: AC
  Administered 2021-07-03: 0.4 mg via INTRAVENOUS

## 2021-07-07 LAB — FUNGUS CULTURE WITH STAIN

## 2021-07-07 LAB — FUNGUS CULTURE RESULT

## 2021-07-07 LAB — FUNGAL ORGANISM REFLEX

## 2021-07-08 ENCOUNTER — Encounter: Payer: Self-pay | Admitting: Certified Registered Nurse Anesthetist

## 2021-07-09 ENCOUNTER — Ambulatory Visit (AMBULATORY_SURGERY_CENTER): Payer: 59 | Admitting: Internal Medicine

## 2021-07-09 ENCOUNTER — Encounter: Payer: Self-pay | Admitting: Internal Medicine

## 2021-07-09 VITALS — BP 122/69 | HR 77 | Temp 97.7°F | Resp 13 | Ht 70.0 in | Wt 201.0 lb

## 2021-07-09 DIAGNOSIS — K2270 Barrett's esophagus without dysplasia: Secondary | ICD-10-CM | POA: Diagnosis not present

## 2021-07-09 DIAGNOSIS — K2970 Gastritis, unspecified, without bleeding: Secondary | ICD-10-CM

## 2021-07-09 DIAGNOSIS — K2990 Gastroduodenitis, unspecified, without bleeding: Secondary | ICD-10-CM | POA: Diagnosis not present

## 2021-07-09 DIAGNOSIS — R1310 Dysphagia, unspecified: Secondary | ICD-10-CM

## 2021-07-09 DIAGNOSIS — Z1211 Encounter for screening for malignant neoplasm of colon: Secondary | ICD-10-CM | POA: Diagnosis not present

## 2021-07-09 DIAGNOSIS — K2950 Unspecified chronic gastritis without bleeding: Secondary | ICD-10-CM | POA: Diagnosis not present

## 2021-07-09 MED ORDER — SODIUM CHLORIDE 0.9 % IV SOLN: 500.0000 mL | Freq: Once | INTRAVENOUS | Status: DC

## 2021-07-09 NOTE — Op Note (Signed)
[facility] Patient Name: Raymond Pugh Procedure Date: 07/09/2021 11:53 AM MRN: [id number] Endoscopist: Gatha Mayer , MD Age: 61 Referring MD:  Date of Birth: 1961-04-25 Gender: Male Account #: [id number] Procedure:                Colonoscopy Indications:              Screening for colorectal malignant neoplasm, Last                            colonoscopy: 2012 Medicines:                Propofol per Anesthesia, Monitored Anesthesia Care Procedure:                Pre-Anesthesia Assessment:                           - Prior to the procedure, a History and Physical                            was performed, and patient medications and                            allergies were reviewed. The patient's tolerance of                            previous anesthesia was also reviewed. The risks                            and benefits of the procedure and the sedation                            options and risks were discussed with the patient.                            All questions were answered, and informed consent                            was obtained. Prior Anticoagulants: The patient has                            taken no previous anticoagulant or antiplatelet                            agents. ASA Grade Assessment: III - A patient with                            severe systemic disease. After reviewing the risks                            and benefits, the patient was deemed in                            satisfactory condition to undergo the procedure.  After obtaining informed consent, the colonoscope                            was passed under direct vision. Throughout the                            procedure, the patient's blood pressure, pulse, and                            oxygen saturations were monitored continuously. The                            Olympus CF-HQ190L [id number]) Colonoscope was                            introduced through  the anus and advanced to the the                            cecum, identified by appendiceal orifice and                            ileocecal valve. The colonoscopy was performed                            without difficulty. The patient tolerated the                            procedure well. The quality of the bowel                            preparation was good. The ileocecal valve,                            appendiceal orifice, and rectum were photographed. Scope In: 12:23:50 PM Scope Out: 12:35:13 PM Scope Withdrawal Time: 0 hours 8 minutes 51 seconds  Total Procedure Duration: 0 hours 11 minutes 23 seconds  Findings:                 The perianal and digital rectal examinations were                            normal.                           The entire examined colon appeared normal on direct                            and retroflexion views. Complications:            No immediate complications. Estimated Blood Loss:     Estimated blood loss: none. Impression:               - The entire examined colon is normal on direct and                            retroflexion views.                           -  No specimens collected. Recommendation:           - Patient has a contact number available for                            emergencies. The signs and symptoms of potential                            delayed complications were discussed with the                            patient. Return to normal activities tomorrow.                            Written discharge instructions were provided to the                            patient.                           - Clear liquids x 1 hour then soft foods rest of                            day. Start prior diet tomorrow.                           - Repeat colonoscopy in 10 years for screening                            purposes. Gatha Mayer, MD 07/09/2021 12:46:05 PM This report has been signed electronically.

## 2021-07-09 NOTE — Patient Instructions (Addendum)
I stretched the esophagus to see if that helps you swallow better. I also took biopsies of the esophagus and stomach where the lining was abnormal. No signs of cancer, ulcers or anything bad.  Colonoscopy was ok - Normal!  I appreciate the opportunity to care for you. Gatha Mayer, MD, [facility]     Handouts were given to your care partner on Barrett's Esophagus and the post Esophageal Dilatation Diet to follow the rest of today. Your sugar was 147 in the recovery room. You may resume your current medications today. Await biopsy results.  May take 1-3 weeks to receive pathology results. Please call if any questions or concerns.     YOU HAD AN ENDOSCOPIC PROCEDURE TODAY AT [facility]:   Refer to the procedure report that was given to you for any specific questions about what was found during the examination.  If the procedure report does not answer your questions, please call your gastroenterologist to clarify.  If you requested that your care partner not be given the details of your procedure findings, then the procedure report has been included in a sealed envelope for you to review at your convenience later.  YOU SHOULD EXPECT: Some feelings of bloating in the abdomen. Passage of more gas than usual.  Walking can help get rid of the air that was put into your GI tract during the procedure and reduce the bloating. If you had a lower endoscopy (such as a colonoscopy or flexible sigmoidoscopy) you may notice spotting of blood in your stool or on the toilet paper. If you underwent a bowel prep for your procedure, you may not have a normal bowel movement for a few days.  Please Note:  You might notice some irritation and congestion in your nose or some drainage.  This is from the oxygen used during your procedure.  There is no need for concern and it should clear up in a day or so.  SYMPTOMS TO REPORT IMMEDIATELY:  Following lower endoscopy (colonoscopy or flexible  sigmoidoscopy):  Excessive amounts of blood in the stool  Significant tenderness or worsening of abdominal pains  Swelling of the abdomen that is new, acute  Fever of 100F or higher  Following upper endoscopy (EGD)  Vomiting of blood or coffee ground material  New chest pain or pain under the shoulder blades  Painful or persistently difficult swallowing  New shortness of breath  Fever of 100F or higher  Black, tarry-looking stools  For urgent or emergent issues, a gastroenterologist can be reached at any hour by calling [phone]. Do not use MyChart messaging for urgent concerns.    DIET:  Please follow the Post Esophageal Dilatation Diet the rest of the day.  Handout was given to your care partner.  Drink plenty of fluids but you should avoid alcoholic beverages for 24 hours.  ACTIVITY:  You should plan to take it easy for the rest of today and you should NOT DRIVE or use heavy machinery until tomorrow (because of the sedation medicines used during the test).    FOLLOW UP: Our staff will call the number listed on your records 48-72 hours following your procedure to check on you and address any questions or concerns that you may have regarding the information given to you following your procedure. If we do not reach you, we will leave a message.  We will attempt to reach you two times.  During this call, we will ask if you have developed any  symptoms of COVID 19. If you develop any symptoms (ie: fever, flu-like symptoms, shortness of breath, cough etc.) before then, please call [phone].  If you test positive for Covid 19 in the 2 weeks post procedure, please call and report this information to [place].    If any biopsies were taken you will be contacted by phone or by letter within the next 1-3 weeks.  Please call us at [phone] if you have not heard about the biopsies in 3 weeks.    SIGNATURES/CONFIDENTIALITY: You and/or your care partner have signed paperwork which will  be entered into your electronic medical record.  These signatures attest to the fact that that the information above on your After Visit Summary has been reviewed and is understood.  Full responsibility of the confidentiality of this discharge information lies with you and/or your care-partner.

## 2021-07-09 NOTE — Progress Notes (Signed)
1156 Robinul 0.1 mg IV given due large amount of secretions upon assessment.  MD made aware, vss

## 2021-07-09 NOTE — Progress Notes (Signed)
VS-DT 

## 2021-07-09 NOTE — Progress Notes (Signed)
Report given to PACU, vss 

## 2021-07-09 NOTE — Op Note (Signed)
[place] Patient Name: Raymond Pugh Procedure Date: 07/09/2021 11:54 AM MRN: [id number] Endoscopist: Gatha Mayer , MD Age: 61 Referring MD:  Date of Birth: 1960-11-29 Gender: Male Account #: [id number] Procedure:                Upper GI endoscopy Indications:              Dysphagia, Barrett's esophagus, Follow-up of                            Barrett's esophagus Medicines:                Propofol per Anesthesia, Monitored Anesthesia Care Procedure:                Pre-Anesthesia Assessment:                           - Prior to the procedure, a History and Physical                            was performed, and patient medications and                            allergies were reviewed. The patient's tolerance of                            previous anesthesia was also reviewed. The risks                            and benefits of the procedure and the sedation                            options and risks were discussed with the patient.                            All questions were answered, and informed consent                            was obtained. Prior Anticoagulants: The patient has                            taken no previous anticoagulant or antiplatelet                            agents. ASA Grade Assessment: III - A patient with                            severe systemic disease. After reviewing the risks                            and benefits, the patient was deemed in                            satisfactory condition to undergo the procedure.  After obtaining informed consent, the endoscope was                            passed under direct vision. Throughout the                            procedure, the patient's blood pressure, pulse, and                            oxygen saturations were monitored continuously. The                            Endoscope was introduced through the mouth, and                            advanced to the  second part of duodenum. The upper                            GI endoscopy was accomplished without difficulty.                            The patient tolerated the procedure well. Scope In: Scope Out: Findings:                 There were esophageal mucosal changes secondary to                            established long-segment Barrett's disease,                            classified as Barrett's stage C0-M1 per [place]                            criteria, classified as Barrett's stage C9-M9 per                            [place] criteria present in the middle third of the                            esophagus and in the lower third of the esophagus.                            The maximum longitudinal extent of these mucosal                            changes was 10 cm in length. Mucosa was biopsied                            with a cold forceps for histology in 4 quadrants at                            intervals of 2 cm from 25 to 35 cm from the  incisors. A total of 5 specimen bottles were sent                            to pathology. Verification of patient                            identification for the specimen was done. Estimated                            blood loss was minimal.                           The gastroesophageal flap valve was visualized                            endoscopically and classified as Hill Grade II                            (fold present, opens with respiration).                           Localized mild inflammation characterized by                            congestion (edema), erythema and friability was                            found in the prepyloric region of the stomach.                            Biopsies were taken with a cold forceps for                            histology. Verification of patient identification                            for the specimen was done. Estimated blood loss was                             minimal.                           The exam was otherwise without abnormality.                           The cardia and gastric fundus were normal on                            retroflexion.                           The scope was withdrawn. Dilation was performed in                            the entire esophagus with a Maloney dilator with  mild resistance at 54 Fr. Estimated blood loss:                            none. Complications:            No immediate complications. Estimated Blood Loss:     Estimated blood loss was minimal. Impression:               - Esophageal mucosal changes secondary to                            established long-segment Barrett's disease,                            classified as Barrett's stage C0-M1 per [place]                            criteria, classified as Barrett's stage C9-M9 per                            [place] criteria. Biopsied.                           - Gastroesophageal flap valve classified as Hill                            Grade II (fold present, opens with respiration).                           - Gastritis. Biopsied.                           - The examination was otherwise normal.                           - Dilation performed in the entire esophagus. Recommendation:           - Patient has a contact number available for                            emergencies. The signs and symptoms of potential                            delayed complications were discussed with the                            patient. Return to normal activities tomorrow.                            Written discharge instructions were provided to the                            patient.                           - Clear liquids x 1 hour then soft foods rest of  day. Start prior diet tomorrow.                           - Continue present medications.                           - Await pathology results.                            - See the other procedure note for documentation of                            additional recommendations. Gatha Mayer, MD 07/09/2021 12:44:23 PM This report has been signed electronically.

## 2021-07-09 NOTE — Progress Notes (Signed)
No problems noted in the recovery room. maw 

## 2021-07-09 NOTE — Progress Notes (Signed)
Called to room to assist during endoscopic procedure.  Patient ID and intended procedure confirmed with present staff. Received instructions for my participation in the procedure from the performing physician.  
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
## 2021-07-10 ENCOUNTER — Other Ambulatory Visit: Payer: Self-pay | Admitting: Family Medicine

## 2021-07-10 DIAGNOSIS — I10 Essential (primary) hypertension: Secondary | ICD-10-CM

## 2021-07-14 ENCOUNTER — Telehealth: Payer: Self-pay

## 2021-07-14 ENCOUNTER — Telehealth: Payer: Self-pay | Admitting: *Deleted

## 2021-07-14 NOTE — Telephone Encounter (Signed)
Patient returned your call, stated that he is doing well.

## 2021-07-14 NOTE — Telephone Encounter (Signed)
No answer on second attempt follow up call.

## 2021-07-14 NOTE — Telephone Encounter (Signed)
°  Follow up Call-  Call back number 07/09/2021  Post procedure Call Back phone  # [phone]  Permission to leave phone message Yes  Some recent data might be hidden   Post-procedure f/u call attempted, no answer and unable to leave voicemail. Will attempt to call patient this afternoon.

## 2021-07-15 ENCOUNTER — Encounter: Payer: Self-pay | Admitting: Internal Medicine

## 2021-07-17 ENCOUNTER — Other Ambulatory Visit: Payer: Self-pay | Admitting: Internal Medicine

## 2021-07-17 DIAGNOSIS — E119 Type 2 diabetes mellitus without complications: Secondary | ICD-10-CM

## 2021-07-17 DIAGNOSIS — Z794 Long term (current) use of insulin: Secondary | ICD-10-CM

## 2021-07-22 LAB — ACID FAST CULTURE WITH REFLEXED SENSITIVITIES (MYCOBACTERIA): Acid Fast Culture: NEGATIVE

## 2021-07-24 ENCOUNTER — Ambulatory Visit: Payer: 59 | Admitting: Internal Medicine

## 2021-07-24 ENCOUNTER — Encounter: Payer: Self-pay | Admitting: Internal Medicine

## 2021-07-24 ENCOUNTER — Other Ambulatory Visit: Payer: Self-pay

## 2021-07-24 VITALS — BP 118/76 | HR 86 | Ht 70.0 in | Wt 199.6 lb

## 2021-07-24 DIAGNOSIS — E785 Hyperlipidemia, unspecified: Secondary | ICD-10-CM | POA: Diagnosis not present

## 2021-07-24 DIAGNOSIS — E1165 Type 2 diabetes mellitus with hyperglycemia: Secondary | ICD-10-CM | POA: Diagnosis not present

## 2021-07-24 DIAGNOSIS — Z794 Long term (current) use of insulin: Secondary | ICD-10-CM

## 2021-07-24 DIAGNOSIS — E663 Overweight: Secondary | ICD-10-CM

## 2021-07-24 LAB — POCT GLYCOSYLATED HEMOGLOBIN (HGB A1C): Hemoglobin A1C: 7.9 % — AB (ref 4.0–5.6)

## 2021-07-24 NOTE — Patient Instructions (Addendum)
Please continue: ?- Metformin 1000 mg 2x a day ?- R insulin 30 minutes before meals: 12-16 units  ?- Ozempic 1 mg weekly ? ?Try to restart: ?- Lantus 20 units in am ? ?TRY TO SCHEDULE A NEW EYE EXAM! ? ?Please come back in 4 months. ?
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
## 2021-07-28 ENCOUNTER — Other Ambulatory Visit: Payer: Self-pay | Admitting: *Deleted

## 2021-07-28 ENCOUNTER — Telehealth: Payer: Self-pay | Admitting: Interventional Cardiology

## 2021-07-28 DIAGNOSIS — I2510 Atherosclerotic heart disease of native coronary artery without angina pectoris: Secondary | ICD-10-CM

## 2021-07-28 DIAGNOSIS — R9439 Abnormal result of other cardiovascular function study: Secondary | ICD-10-CM

## 2021-07-28 NOTE — Telephone Encounter (Signed)
Forwarded Dr Thompson Caul recommendation for Tylenol to patient via [place]. ?

## 2021-07-28 NOTE — Telephone Encounter (Signed)
Pt c/o of Chest Pain: STAT if CP now or developed within 24 hours ? ?1. Are you having CP right now? Right in the left side of breast plate. Goes around the the back ? ?2. Are you experiencing any other symptoms (ex. SOB, nausea, vomiting, sweating)? Pt denies any of these symptoms  ? ?3. How long have you been experiencing CP? A couple days ago ? ?4. Is your CP continuous or coming and going? Coming and going  ? ?5. Have you taken Nitroglycerin? No  ? ?Best number [phone] ??  ?

## 2021-07-28 NOTE — Telephone Encounter (Signed)
Spoke with patient who states that he has been experiencing a dull pain that he states is located at his "center breast plate just beside my left titty and goes around my side to my shoulder on L side." Pt says pain started 2 days ago, intermittent, and not worse or re-producible with activity. Pt denies N/V, diaphoresis, jaw/arm pain, vision changes or SOB. Does not check vitals at home, but states that his appt at the endocrinologist "the other day" BP was 117/76. No recent medication changes. Pt says he has researched this pain and it's location online and thinks he has fluid build up around his heart and he is very concerned. Pt states that the website said one treatment was diclofenac, which his wife had an RX for and he took one and it helped the pain to go away all day, but came back next day. Pt wants Dr Tamala Julian to write him an RX for this until we find out what is wrong. 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 Also explained that Dr Tamala Julian will not prescribe this without proof of need, not self-diagnosis. Pt is aware and agrees to proceed with plan in place for ECHO. Will route to Tamala Julian, MD as Juluis Rainier. Reiterated to patient that if any of the above symptoms should occur, he can go to ED or call us back. ?

## 2021-08-05 ENCOUNTER — Ambulatory Visit (HOSPITAL_COMMUNITY): Payer: 59 | Attending: Cardiovascular Disease

## 2021-08-05 ENCOUNTER — Other Ambulatory Visit: Payer: Self-pay

## 2021-08-05 DIAGNOSIS — I2510 Atherosclerotic heart disease of native coronary artery without angina pectoris: Secondary | ICD-10-CM | POA: Diagnosis present

## 2021-08-05 DIAGNOSIS — R9439 Abnormal result of other cardiovascular function study: Secondary | ICD-10-CM | POA: Insufficient documentation

## 2021-08-05 LAB — ECHOCARDIOGRAM COMPLETE
Area-P 1/2: 4.06 cm2
S' Lateral: 3 cm

## 2021-08-11 ENCOUNTER — Other Ambulatory Visit (HOSPITAL_COMMUNITY): Payer: Self-pay

## 2021-08-12 ENCOUNTER — Other Ambulatory Visit (HOSPITAL_COMMUNITY): Payer: Self-pay

## 2021-08-12 ENCOUNTER — Telehealth: Payer: Self-pay | Admitting: Internal Medicine

## 2021-08-12 ENCOUNTER — Telehealth: Payer: Self-pay

## 2021-08-12 NOTE — Telephone Encounter (Signed)
Patient's wife Raymond Pugh called today and said that his claim for EGD/Colon was denied because there was no auth sent into [employer].  I knew there was no auth required, but I called anyway and got a rep to get a confirmation number. ?Per [place] @ [employer] ?No auth req for [id number] and [id number] because we are in network. ?Ref# [name] 08/12/21 9:11am CST ?She then transferred me to claims to see if they could pull it up and give [place] more information.  After 30 minutes of hold time, I had to hang up. ?[name] and gave her all the info I received from [employer] and told her I would forward info to my supervisor to get assistance on this issue.  Also mentioned to give her a few days to call back.  She thanked me for calling and giving her an update. ? ?

## 2021-08-12 NOTE — Telephone Encounter (Signed)
Patient Advocate Encounter ? ?Prior Authorization for Ozempic '4mg'$ /12m pen injectors has been approved.   ? ?PA# [id number] ? ?Effective dates: 08/12/21 through 08/13/22 ? ?Per Test Claim Patients co-pay is $0.  ? ?Spoke with Pharmacy to Process. ? ?Patient Advocate ?Fax: [phone] ?

## 2021-08-12 NOTE — Telephone Encounter (Signed)
Patient Advocate Encounter ?  ?Received notification from [facility] that prior authorization for Ozempic '4mg'$ /37m pen injectors is required by his/her [employer] ?  ?PA submitted on 08/12/21 ? ?Key#: [id number]? ?Status is pending ?   ?[facility] will continue to follow: ? ?Patient Advocate ?Fax: [phone] ?

## 2021-10-01 ENCOUNTER — Other Ambulatory Visit: Payer: Self-pay | Admitting: Family Medicine

## 2021-10-01 DIAGNOSIS — I10 Essential (primary) hypertension: Secondary | ICD-10-CM

## 2021-10-03 ENCOUNTER — Other Ambulatory Visit: Payer: Self-pay | Admitting: Internal Medicine

## 2021-10-14 ENCOUNTER — Encounter (INDEPENDENT_AMBULATORY_CARE_PROVIDER_SITE_OTHER): Payer: 59 | Admitting: Ophthalmology

## 2021-10-22 ENCOUNTER — Encounter (INDEPENDENT_AMBULATORY_CARE_PROVIDER_SITE_OTHER): Payer: 59 | Admitting: Ophthalmology

## 2021-10-22 DIAGNOSIS — H35033 Hypertensive retinopathy, bilateral: Secondary | ICD-10-CM

## 2021-10-22 DIAGNOSIS — H338 Other retinal detachments: Secondary | ICD-10-CM

## 2021-10-22 DIAGNOSIS — H35372 Puckering of macula, left eye: Secondary | ICD-10-CM | POA: Diagnosis not present

## 2021-10-22 DIAGNOSIS — I10 Essential (primary) hypertension: Secondary | ICD-10-CM | POA: Diagnosis not present

## 2021-10-22 DIAGNOSIS — H43813 Vitreous degeneration, bilateral: Secondary | ICD-10-CM | POA: Diagnosis not present

## 2021-10-22 DIAGNOSIS — H2513 Age-related nuclear cataract, bilateral: Secondary | ICD-10-CM

## 2021-11-04 ENCOUNTER — Other Ambulatory Visit: Payer: Self-pay

## 2021-11-04 ENCOUNTER — Telehealth: Payer: Self-pay | Admitting: Family Medicine

## 2021-11-04 DIAGNOSIS — E785 Hyperlipidemia, unspecified: Secondary | ICD-10-CM

## 2021-11-04 DIAGNOSIS — I10 Essential (primary) hypertension: Secondary | ICD-10-CM

## 2021-11-04 MED ORDER — LOVASTATIN 20 MG PO TABS: 20.0000 mg | ORAL_TABLET | Freq: Every day | ORAL | 0 refills | Status: DC

## 2021-11-04 NOTE — Telephone Encounter (Signed)
Patient states he needed refill on all medications but looks like he only needed a refill on Lovastatin because everything else should have refills

## 2021-11-04 NOTE — Telephone Encounter (Signed)
PT has a appt for 11/18/21. Pt want to know if he can get refills on his medications before the office visit.

## 2021-11-18 ENCOUNTER — Ambulatory Visit (INDEPENDENT_AMBULATORY_CARE_PROVIDER_SITE_OTHER): Payer: 59 | Admitting: Family Medicine

## 2021-11-18 VITALS — BP 138/76 | HR 78 | Temp 98.0°F | Resp 16 | Ht 70.0 in | Wt 207.2 lb

## 2021-11-18 DIAGNOSIS — E1165 Type 2 diabetes mellitus with hyperglycemia: Secondary | ICD-10-CM | POA: Diagnosis not present

## 2021-11-18 DIAGNOSIS — E785 Hyperlipidemia, unspecified: Secondary | ICD-10-CM

## 2021-11-18 DIAGNOSIS — I10 Essential (primary) hypertension: Secondary | ICD-10-CM | POA: Diagnosis not present

## 2021-11-18 DIAGNOSIS — E1142 Type 2 diabetes mellitus with diabetic polyneuropathy: Secondary | ICD-10-CM | POA: Diagnosis not present

## 2021-11-18 DIAGNOSIS — E119 Type 2 diabetes mellitus without complications: Secondary | ICD-10-CM

## 2021-11-18 DIAGNOSIS — M7592 Shoulder lesion, unspecified, left shoulder: Secondary | ICD-10-CM

## 2021-11-18 DIAGNOSIS — Z794 Long term (current) use of insulin: Secondary | ICD-10-CM

## 2021-11-18 MED ORDER — RAMIPRIL 10 MG PO CAPS: ORAL_CAPSULE | ORAL | 1 refills | Status: DC

## 2021-11-18 MED ORDER — GABAPENTIN 100 MG PO CAPS: 100.0000 mg | ORAL_CAPSULE | Freq: Two times a day (BID) | ORAL | 0 refills | Status: DC

## 2021-11-18 MED ORDER — LOVASTATIN 20 MG PO TABS: 20.0000 mg | ORAL_TABLET | Freq: Every day | ORAL | 1 refills | Status: DC
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
## 2021-11-18 NOTE — Patient Instructions (Addendum)
Fasting lab visit in next few days.  Try gabapentin at night initially to see if that helps with foot pains. If helpful but still daytime symptoms after 1 week can increase to 2 times per day. Recheck in 1 month, but also discuss pain with your podiatrist.  I will refer you to dermatology for the area on your left shoulder.  We can discuss adderall further at next visit.   Return to the clinic or go to the nearest emergency room if any of your symptoms worsen or new symptoms occur. Thanks for coming in today.

## 2021-11-21 ENCOUNTER — Encounter: Payer: Self-pay | Admitting: Family Medicine

## 2021-12-02 ENCOUNTER — Ambulatory Visit (HOSPITAL_BASED_OUTPATIENT_CLINIC_OR_DEPARTMENT_OTHER): Admission: RE | Admit: 2021-12-02 | Payer: 59 | Source: Ambulatory Visit
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
## 2021-12-07 ENCOUNTER — Ambulatory Visit: Payer: 59 | Admitting: Internal Medicine

## 2021-12-18 ENCOUNTER — Encounter: Payer: Self-pay | Admitting: Family Medicine

## 2021-12-18 ENCOUNTER — Ambulatory Visit (INDEPENDENT_AMBULATORY_CARE_PROVIDER_SITE_OTHER): Payer: 59 | Admitting: Family Medicine

## 2021-12-18 VITALS — BP 130/82 | HR 82 | Temp 97.9°F | Resp 20 | Ht 70.0 in | Wt 215.4 lb

## 2021-12-18 DIAGNOSIS — K140 Glossitis: Secondary | ICD-10-CM | POA: Diagnosis not present

## 2021-12-18 DIAGNOSIS — M7592 Shoulder lesion, unspecified, left shoulder: Secondary | ICD-10-CM | POA: Diagnosis not present

## 2021-12-18 DIAGNOSIS — E1142 Type 2 diabetes mellitus with diabetic polyneuropathy: Secondary | ICD-10-CM | POA: Diagnosis not present

## 2021-12-18 DIAGNOSIS — K029 Dental caries, unspecified: Secondary | ICD-10-CM | POA: Diagnosis not present

## 2021-12-18 MED ORDER — TRIAMCINOLONE ACETONIDE 0.1 % MT PSTE: 1.0000 | PASTE | Freq: Two times a day (BID) | OROMUCOSAL | 0 refills | Status: DC

## 2021-12-18 MED ORDER — GABAPENTIN 100 MG PO CAPS
100.0000 mg | ORAL_CAPSULE | Freq: Two times a day (BID) | ORAL | 1 refills | Status: DC
Start: 2021-12-18 — End: 2021-12-29
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
## 2021-12-18 NOTE — Patient Instructions (Addendum)
I will check into dermatology referral.   Ok to take gabapentin 2 times per day if that is working well for neuropathy. If 3rd dose of gabapentin is needed consistently let me know.  Please have fasting labs done as soon as possible.   I do not see any sign of infection around the broken tooth at this time but if you have any gum swelling, face pain, or increasing pain be seen right away.  I do recommend seeing dentist as soon as possible as the sharp/irritated area of the tooth may be continuing to cause problems with your tongue. Try applying dental wax to sharp area for now (should be able to purchase over the counter).  Steroid paste to tongue lesion twice per day may help. It does not look infected at this time.   Return to the clinic or go to the nearest emergency room if any of your symptoms worsen or new symptoms occur.

## 2021-12-29 ENCOUNTER — Other Ambulatory Visit: Payer: Self-pay | Admitting: Family Medicine

## 2021-12-29 DIAGNOSIS — E1142 Type 2 diabetes mellitus with diabetic polyneuropathy: Secondary | ICD-10-CM

## 2021-12-29 NOTE — Telephone Encounter (Signed)
No concerns on controlled substance database review.  Refills ordered.

## 2021-12-30 ENCOUNTER — Encounter: Payer: Self-pay | Admitting: Anesthesiology

## 2021-12-30 ENCOUNTER — Encounter: Payer: Self-pay | Admitting: Ophthalmology

## 2021-12-30 ENCOUNTER — Other Ambulatory Visit: Payer: Self-pay

## 2021-12-30 NOTE — Discharge Instructions (Signed)

## 2022-01-01 ENCOUNTER — Telehealth (INDEPENDENT_AMBULATORY_CARE_PROVIDER_SITE_OTHER): Payer: Commercial Managed Care - HMO | Admitting: Family Medicine

## 2022-01-01 ENCOUNTER — Encounter: Payer: Self-pay | Admitting: Family Medicine

## 2022-01-01 ENCOUNTER — Telehealth: Payer: Self-pay | Admitting: Family Medicine

## 2022-01-01 VITALS — Ht 70.0 in

## 2022-01-01 DIAGNOSIS — U071 COVID-19: Secondary | ICD-10-CM

## 2022-01-01 MED ORDER — NIRMATRELVIR/RITONAVIR (PAXLOVID)TABLET: 3.0000 | ORAL_TABLET | Freq: Two times a day (BID) | ORAL | 0 refills | Status: AC

## 2022-01-01 NOTE — Progress Notes (Signed)
Established Patient Office Visit  Subjective   Patient ID: Raymond Pugh, male    DOB: 1960-06-06  Age: 61 y.o. MRN: [id number]  Chief Complaint  Patient presents with   Cough    Cough, fever, body aches, no energy symptoms at home covid test positive today.     Cough Associated symptoms include chills, a fever, headaches, myalgias and a sore throat. Pertinent negatives include no eye redness, rash, shortness of breath or wheezing.   presents with a 1 day history of headache with nasal congestion postnasal drip sore throat and minor cough.  There has been no fever 102 degrees that responded to Tylenol.  Denies history of asthma or or difficulty breathing.  Past medical history was reviewed.  Last GFR was 60.  He tested positive for COVID yesterday.    Review of Systems  Constitutional:  Positive for chills, fever and malaise/fatigue.  HENT:  Positive for congestion and sore throat.   Eyes:  Negative for blurred vision, discharge and redness.  Respiratory:  Positive for cough. Negative for sputum production, shortness of breath and wheezing.   Cardiovascular: Negative.   Gastrointestinal:  Negative for abdominal pain.  Genitourinary: Negative.   Musculoskeletal:  Positive for joint pain and myalgias.  Skin:  Negative for rash.  Neurological:  Positive for headaches. Negative for tingling, loss of consciousness and weakness.  Endo/Heme/Allergies:  Negative for polydipsia.      Objective:     Ht '5\' 10"'$  (1.778 m)   BMI 28.70 kg/m    Physical Exam Constitutional:      General: He is not in acute distress.    Appearance: Normal appearance. He is not ill-appearing, toxic-appearing or diaphoretic.  HENT:     Head: Normocephalic and atraumatic.     Right Ear: External ear normal.     Left Ear: External ear normal.  Eyes:     General: No scleral icterus.       Right eye: No discharge.        Left eye: No discharge.     Extraocular Movements: Extraocular movements intact.      Conjunctiva/sclera: Conjunctivae normal.  Pulmonary:     Effort: Pulmonary effort is normal. No respiratory distress.  Abdominal:     Tenderness: There is no guarding.  Skin:    General: Skin is warm and dry.  Neurological:     Mental Status: He is alert and oriented to person, place, and time.  Psychiatric:        Mood and Affect: Mood normal.        Behavior: Behavior normal.      No results Pugh for any visits on 01/01/22.    The 10-year ASCVD risk score (Arnett DK, et al., 2019) is: 15.1%    Assessment & Plan:   Problem List Items Addressed This Visit       Other   COVID-19 - Primary   Relevant Medications   nirmatrelvir/ritonavir EUA (PAXLOVID) 20 x 150 MG & 10 x '100MG'$  TABS    No follow-ups on file.  We will start Paxlovid.  We will quarantine through Tuesday.  May use over-the-counter's as needed including Tylenol and Advil.  Follow-up with Dr. Nyoka Cowden if not improving  Libby Maw, MD  Virtual Visit via Video Note  I connected with Raymond Pugh on 01/01/22 at  4:00 PM EDT by a video enabled telemedicine application and verified that I am speaking with the correct person using two identifiers.  Location:  Patient: home alone in a room. Wife is in the house.  Provider: work   I discussed the limitations of evaluation and management by telemedicine and the availability of in person appointments. The patient expressed understanding and agreed to proceed.  History of Present Illness:    Observations/Objective:   Assessment and Plan:   Follow Up Instructions:    I discussed the assessment and treatment plan with the patient. The patient was provided an opportunity to ask questions and all were answered. The patient agreed with the plan and demonstrated an understanding of the instructions.   The patient was advised to call back or seek an in-person evaluation if the symptoms worsen or if the condition fails to improve as  anticipated.  I provided 25 minutes of non-face-to-face time during this encounter.   Libby Maw, MD

## 2022-01-01 NOTE — Telephone Encounter (Signed)
Pt has been called and made a virtual for this afternoon

## 2022-01-01 NOTE — Telephone Encounter (Signed)
Caller name: Dishon Kehoe   On DPR? :yes/no: Yes  Call back number: [phone]  Provider they see: Carlota Raspberry   Reason for call: Wife called stating that her husband has Covid. Wife to know what her husband can to help him. Pt pharmacy is [id number] - [place], [address] AT [address].

## 2022-01-05 ENCOUNTER — Ambulatory Visit
Admission: RE | Admit: 2022-01-05 | Payer: Commercial Managed Care - HMO | Source: Home / Self Care | Admitting: Ophthalmology

## 2022-01-05 SURGERY — PHACOEMULSIFICATION, CATARACT, WITH IOL INSERTION
Anesthesia: Topical | Laterality: Left

## 2022-01-14 ENCOUNTER — Telehealth: Payer: Self-pay

## 2022-01-14 NOTE — Telephone Encounter (Signed)
Patient Advocate Encounter   Received notification from [facility] that prior authorization is required for Dexcom G6 Sensor. PA submitted and APPROVED on 01/14/2022.  Key [id number] Effective: 01/14/2022 - 01/14/2023  Clista Bernhardt, CPhT Rx Patient Advocate Specialist Phone: [phone]

## 2022-01-31 ENCOUNTER — Other Ambulatory Visit: Payer: Self-pay | Admitting: Family Medicine

## 2022-01-31 DIAGNOSIS — E785 Hyperlipidemia, unspecified: Secondary | ICD-10-CM

## 2022-02-03 ENCOUNTER — Telehealth (INDEPENDENT_AMBULATORY_CARE_PROVIDER_SITE_OTHER): Payer: Commercial Managed Care - HMO | Admitting: Family Medicine

## 2022-02-03 ENCOUNTER — Encounter: Payer: Self-pay | Admitting: Family Medicine

## 2022-02-03 VITALS — Ht 70.0 in | Wt 200.0 lb

## 2022-02-03 DIAGNOSIS — E1165 Type 2 diabetes mellitus with hyperglycemia: Secondary | ICD-10-CM

## 2022-02-03 DIAGNOSIS — M79674 Pain in right toe(s): Secondary | ICD-10-CM | POA: Diagnosis not present

## 2022-02-03 DIAGNOSIS — Z794 Long term (current) use of insulin: Secondary | ICD-10-CM

## 2022-02-03 DIAGNOSIS — S91109A Unspecified open wound of unspecified toe(s) without damage to nail, initial encounter: Secondary | ICD-10-CM

## 2022-02-03 MED ORDER — DOXYCYCLINE HYCLATE 100 MG PO TABS: 100.0000 mg | ORAL_TABLET | Freq: Two times a day (BID) | ORAL | 0 refills | Status: DC
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
## 2022-02-03 NOTE — Patient Instructions (Signed)
Start doxycyline for possible skin infection in the toe. If that is not getting much better in next 2 days, be seen in person. Call me and we will get you an appointment. I also ordered an xray to be completed at our [facility] location.   Return to the clinic or go to the nearest emergency room if any of your symptoms worsen or new symptoms occur.  Cellulitis, Adult  Cellulitis is a skin infection. The infected area is usually warm, red, swollen, and tender. This condition occurs most often in the arms and lower legs. The infection can travel to the muscles, blood, and underlying tissue and become serious. It is very important to get treated for this condition. What are the causes? Cellulitis is caused by bacteria. The bacteria enter through a break in the skin, such as a cut, burn, insect bite, open sore, or crack. What increases the risk? This condition is more likely to occur in people who: Have a weak body defense system (immune system). Have open wounds on the skin, such as cuts, burns, bites, and scrapes. Bacteria can enter the body through these open wounds. Are older than 61 years of age. Have diabetes. Have a type of long-lasting (chronic) liver disease (cirrhosis) or kidney disease. Are obese. Have a skin condition such as: Itchy rash (eczema). Slow movement of blood in the veins (venous stasis). Fluid buildup below the skin (edema). Have had radiation therapy. Use IV drugs. What are the signs or symptoms? Symptoms of this condition include: Redness, streaking, or spotting on the skin. Swollen area of the skin. Tenderness or pain when an area of the skin is touched. Warm skin. A fever. Chills. Blisters. How is this diagnosed? This condition is diagnosed based on a medical history and physical exam. You may also have tests, including: Blood tests. Imaging tests. How is this treated? Treatment for this condition may include: Medicines, such as antibiotic medicines or  medicines to treat allergies (antihistamines). Supportive care, such as rest and application of cold or warm cloths (compresses) to the skin. Hospital care, if the condition is severe. The infection usually starts to get better within 1-2 days of treatment. Follow these instructions at home:  Medicines Take over-the-counter and prescription medicines only as told by your health care provider. If you were prescribed an antibiotic medicine, take it as told by your health care provider. Do not stop taking the antibiotic even if you start to feel better. General instructions Drink enough fluid to keep your urine pale yellow. Do not touch or rub the infected area. Raise (elevate) the infected area above the level of your heart while you are sitting or lying down. Apply warm or cold compresses to the affected area as told by your health care provider. Keep all follow-up visits as told by your health care provider. This is important. These visits let your health care provider make sure a more serious infection is not developing. Contact a health care provider if: You have a fever. Your symptoms do not begin to improve within 1-2 days of starting treatment. Your bone or joint underneath the infected area becomes painful after the skin has healed. Your infection returns in the same area or another area. You notice a swollen bump in the infected area. You develop new symptoms. You have a general ill feeling (malaise) with muscle aches and pains. Get help right away if: Your symptoms get worse. You feel very sleepy. You develop vomiting or diarrhea that persists. You notice  red streaks coming from the infected area. Your red area gets larger or turns dark in color. These symptoms may represent a serious problem that is an emergency. Do not wait to see if the symptoms will go away. Get medical help right away. Call your local emergency services (911 in the [place] Do not drive yourself to the  hospital. Summary Cellulitis is a skin infection. This condition occurs most often in the arms and lower legs. Treatment for this condition may include medicines, such as antibiotic medicines or antihistamines. Take over-the-counter and prescription medicines only as told by your health care provider. If you were prescribed an antibiotic medicine, do not stop taking the antibiotic even if you start to feel better. Contact a health care provider if your symptoms do not begin to improve within 1-2 days of starting treatment or your symptoms get worse. Keep all follow-up visits as told by your health care provider. This is important. These visits let your health care provider make sure that a more serious infection is not developing. This information is not intended to replace advice given to you by your health care provider. Make sure you discuss any questions you have with your health care provider. Document Revised: 02/18/2021 Document Reviewed: 02/19/2021 Elsevier Patient Education  [place].

## 2022-02-07 ENCOUNTER — Encounter: Payer: Self-pay | Admitting: Family Medicine

## 2022-02-12 ENCOUNTER — Encounter: Payer: Self-pay | Admitting: Family Medicine

## 2022-02-22 ENCOUNTER — Encounter (INDEPENDENT_AMBULATORY_CARE_PROVIDER_SITE_OTHER): Payer: Commercial Managed Care - HMO | Admitting: Ophthalmology

## 2022-03-01 ENCOUNTER — Encounter: Payer: Self-pay | Admitting: Ophthalmology

## 2022-03-04 NOTE — Discharge Instructions (Signed)

## 2022-03-09 ENCOUNTER — Ambulatory Visit
Admission: RE | Admit: 2022-03-09 | Discharge: 2022-03-09 | Disposition: A | Discharge status: Home / Self Care | Payer: Commercial Managed Care - HMO | Attending: Ophthalmology | Admitting: Ophthalmology

## 2022-03-09 ENCOUNTER — Ambulatory Visit: Payer: Commercial Managed Care - HMO | Admitting: Anesthesiology

## 2022-03-09 ENCOUNTER — Encounter: Payer: Self-pay | Admitting: Ophthalmology

## 2022-03-09 ENCOUNTER — Other Ambulatory Visit: Payer: Self-pay

## 2022-03-09 ENCOUNTER — Encounter
Admission: RE | Disposition: A | Discharge status: Home / Self Care | Payer: Self-pay | Source: Home / Self Care | Attending: Ophthalmology

## 2022-03-09 DIAGNOSIS — E1136 Type 2 diabetes mellitus with diabetic cataract: Secondary | ICD-10-CM | POA: Insufficient documentation

## 2022-03-09 DIAGNOSIS — H2512 Age-related nuclear cataract, left eye: Secondary | ICD-10-CM | POA: Insufficient documentation

## 2022-03-09 DIAGNOSIS — I10 Essential (primary) hypertension: Secondary | ICD-10-CM | POA: Diagnosis not present

## 2022-03-09 DIAGNOSIS — K449 Diaphragmatic hernia without obstruction or gangrene: Secondary | ICD-10-CM | POA: Diagnosis not present

## 2022-03-09 HISTORY — PX: CATARACT EXTRACTION W/PHACO: SHX586

## 2022-03-09 LAB — GLUCOSE, CAPILLARY
Glucose-Capillary: 151 mg/dL — ABNORMAL HIGH (ref 70–99)
Glucose-Capillary: 151 mg/dL — ABNORMAL HIGH (ref 70–99)

## 2022-03-09 SURGERY — PHACOEMULSIFICATION, CATARACT, WITH IOL INSERTION
Anesthesia: Topical | Site: Eye | Laterality: Left

## 2022-03-09 MED ORDER — ARMC OPHTHALMIC DILATING DROPS
1.0000 | OPHTHALMIC | Status: DC | PRN
  Administered 2022-03-09 (×3): 1 via OPHTHALMIC

## 2022-03-09 MED ORDER — MIDAZOLAM HCL 2 MG/2ML IJ SOLN
INTRAMUSCULAR | Status: DC | PRN
  Administered 2022-03-09: 1.5 mg via INTRAVENOUS
  Administered 2022-03-09: .5 mg via INTRAVENOUS

## 2022-03-09 MED ORDER — SIGHTPATH DOSE#1 BSS IO SOLN
INTRAOCULAR | Status: DC | PRN
  Administered 2022-03-09: 15 mL via INTRAOCULAR

## 2022-03-09 MED ORDER — SIGHTPATH DOSE#1 BSS IO SOLN
INTRAOCULAR | Status: DC | PRN
  Administered 2022-03-09: 2 mL

## 2022-03-09 MED ORDER — SIGHTPATH DOSE#1 BSS IO SOLN
INTRAOCULAR | Status: DC | PRN
  Administered 2022-03-09: 39 mL via OPHTHALMIC

## 2022-03-09 MED ORDER — LACTATED RINGERS IV SOLN: INTRAVENOUS | Status: DC

## 2022-03-09 MED ORDER — TETRACAINE HCL 0.5 % OP SOLN
1.0000 [drp] | OPHTHALMIC | Status: DC | PRN
  Administered 2022-03-09 (×3): 1 [drp] via OPHTHALMIC

## 2022-03-09 MED ORDER — SIGHTPATH DOSE#1 NA CHONDROIT SULF-NA HYALURON 40-17 MG/ML IO SOLN
INTRAOCULAR | Status: DC | PRN
  Administered 2022-03-09: 1 mL via INTRAOCULAR

## 2022-03-09 MED ORDER — FENTANYL CITRATE (PF) 100 MCG/2ML IJ SOLN
INTRAMUSCULAR | Status: DC | PRN
  Administered 2022-03-09: 25 ug via INTRAVENOUS
  Administered 2022-03-09: 50 ug via INTRAVENOUS
  Administered 2022-03-09: 25 ug via INTRAVENOUS

## 2022-03-09 MED ORDER — BRIMONIDINE TARTRATE-TIMOLOL 0.2-0.5 % OP SOLN
OPHTHALMIC | Status: DC | PRN
  Administered 2022-03-09: 1 [drp] via OPHTHALMIC

## 2022-03-09 MED ORDER — MOXIFLOXACIN HCL 0.5 % OP SOLN
OPHTHALMIC | Status: DC | PRN
  Administered 2022-03-09: 0.2 mL via OPHTHALMIC

## 2022-03-09 SURGICAL SUPPLY — 11 items
CANNULA ANT/CHMB 27G (MISCELLANEOUS) IMPLANT
CANNULA ANT/CHMB 27GA (MISCELLANEOUS) IMPLANT
CATARACT SUITE SIGHTPATH (MISCELLANEOUS) ×1 IMPLANT
FEE CATARACT SUITE SIGHTPATH (MISCELLANEOUS) ×1 IMPLANT
GLOVE SURG ENC TEXT LTX SZ8 (GLOVE) ×1 IMPLANT
GLOVE SURG TRIUMPH 8.0 PF LTX (GLOVE) ×1 IMPLANT
LENS IOL TECNIS EYHANCE 19.0 (Intraocular Lens) IMPLANT
NDL FILTER BLUNT 18X1 1/2 (NEEDLE) ×1 IMPLANT
NEEDLE FILTER BLUNT 18X1 1/2 (NEEDLE) ×1 IMPLANT
SYR 3ML LL SCALE MARK (SYRINGE) ×1 IMPLANT
WATER STERILE IRR 250ML POUR (IV SOLUTION) ×1 IMPLANT

## 2022-03-09 NOTE — Op Note (Signed)
PREOPERATIVE DIAGNOSIS:  Nuclear sclerotic cataract of the left eye.   POSTOPERATIVE DIAGNOSIS:  Nuclear sclerotic cataract of the left eye.   OPERATIVE PROCEDURE:ORPROCALL@   SURGEON:  Birder Robson, MD.   ANESTHESIA:  Anesthesiologist: Dimas Millin, MD CRNA: Patience Musca., CRNA  1.      Managed anesthesia care. 2.     0.48m of Shugarcaine was instilled following the paracentesis   COMPLICATIONS:  None.   TECHNIQUE:   Stop and chop   DESCRIPTION OF PROCEDURE:  The patient was examined and consented in the preoperative holding area where the aforementioned topical anesthesia was applied to the left eye and then brought back to the Operating Room where the left eye was prepped and draped in the usual sterile ophthalmic fashion and a lid speculum was placed. A paracentesis was created with the side port blade and the anterior chamber was filled with viscoelastic. A near clear corneal incision was performed with the steel keratome. A continuous curvilinear capsulorrhexis was performed with a cystotome followed by the capsulorrhexis forceps. Hydrodissection and hydrodelineation were carried out with BSS on a blunt cannula. The lens was removed in a stop and chop  technique and the remaining cortical material was removed with the irrigation-aspiration handpiece. The capsular bag was inflated with viscoelastic and the Technis [id number] lens was placed in the capsular bag without complication. The remaining viscoelastic was removed from the eye with the irrigation-aspiration handpiece. The wounds were hydrated. The anterior chamber was flushed with BSS and the eye was inflated to physiologic pressure. 0.126mVigamox was placed in the anterior chamber. The wounds were found to be water tight. The eye was dressed with Combigan. The patient was given protective glasses to wear throughout the day and a shield with which to sleep tonight. The patient was also given drops with which to begin a drop  regimen today and will follow-up with me in one day. Implant Name Type Inv. Item Serial No. Manufacturer Lot No. LRB No. Used Action  LENS IOL TECNIS EYHANCE 19.0 - [id number] Lens LENS IOL TECNIS EYHANCE 19.0 [id number]  Left 1 Implanted    Procedure(s) with comments: CATARACT EXTRACTION PHACO AND INTRAOCULAR LENS PLACEMENT (IOC) LEFT DIABETIC 5.16 00:36.6 (Left) - Diabetic  Electronically signed: WiBirder Robson0/17/2023 1:22 PM

## 2022-03-09 NOTE — Anesthesia Postprocedure Evaluation (Signed)
Anesthesia Post Note  Patient: Raymond Pugh  Procedure(s) Performed: CATARACT EXTRACTION PHACO AND INTRAOCULAR LENS PLACEMENT (IOC) LEFT DIABETIC 5.16 00:36.6 (Left: Eye)  Patient location during evaluation: PACU Anesthesia Type: MAC Level of consciousness: awake and alert Pain management: pain level controlled Vital Signs Assessment: post-procedure vital signs reviewed and stable Respiratory status: spontaneous breathing, nonlabored ventilation, respiratory function stable and patient connected to nasal cannula oxygen Cardiovascular status: stable and blood pressure returned to baseline Postop Assessment: no apparent nausea or vomiting Anesthetic complications: no   There were no known notable events for this encounter.   Last Vitals:  Vitals:   03/09/22 1322 03/09/22 1328  BP: 126/88 132/71  Pulse: 69 66  Resp: 20 12  Temp: 36.7 C   SpO2: 94% 97%    Last Pain:  Vitals:   03/09/22 1328  TempSrc:   PainSc: 0-No pain                 Dimas Millin
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
## 2022-03-09 NOTE — Transfer of Care (Signed)
Immediate Anesthesia Transfer of Care Note  Patient: Raymond Pugh  Procedure(s) Performed: CATARACT EXTRACTION PHACO AND INTRAOCULAR LENS PLACEMENT (IOC) LEFT DIABETIC 5.16 00:36.6 (Left: Eye)  Patient Location: PACU  Anesthesia Type: No value filed.  Level of Consciousness: awake, alert  and patient cooperative  Airway and Oxygen Therapy: Patient Spontanous Breathing and Patient connected to supplemental oxygen  Post-op Assessment: Post-op Vital signs reviewed, Patient's Cardiovascular Status Stable, Respiratory Function Stable, Patent Airway and No signs of Nausea or vomiting  Post-op Vital Signs: Reviewed and stable  Complications: There were no known notable events for this encounter.

## 2022-03-09 NOTE — Anesthesia Preprocedure Evaluation (Signed)
Anesthesia Evaluation  Patient identified by MRN, date of birth, ID band Patient awake    Reviewed: Allergy & Precautions, NPO status , Patient's Chart, lab work & pertinent test results  Airway Mallampati: II  TM Distance: >3 FB Neck ROM: Full    Dental  (+) Dental Advisory Given   Pulmonary neg shortness of breath, neg COPD,  Pulmonary nodule   breath sounds clear to auscultation       Cardiovascular METS: 5 - 7 Mets hypertension, Pt. on medications and Pt. on home beta blockers (-) angina+ Peripheral Vascular Disease  (-) Past MI and (-) CABG  Rhythm:Regular Rate:Normal     Neuro/Psych PSYCHIATRIC DISORDERS negative neurological ROS     GI/Hepatic Neg liver ROS, hiatal hernia,   Endo/Other  diabetes  Renal/GU negative Renal ROS     Musculoskeletal   Abdominal   Peds  Hematology  (+) Blood dyscrasia, anemia ,   Anesthesia Other Findings   Reproductive/Obstetrics                             Anesthesia Physical  Anesthesia Plan  ASA: 2  Anesthesia Plan: MAC   Post-op Pain Management: Minimal or no pain anticipated   Induction: Intravenous  PONV Risk Score and Plan: 1 and TIVA  Airway Management Planned: Natural Airway and Nasal Cannula  Additional Equipment: None  Intra-op Plan:   Post-operative Plan: Extubation in OR  Informed Consent: I have reviewed the patients History and Physical, chart, labs and discussed the procedure including the risks, benefits and alternatives for the proposed anesthesia with the patient or authorized representative who has indicated his/her understanding and acceptance.     Dental advisory given  Plan Discussed with: CRNA  Anesthesia Plan Comments: ( )        Anesthesia Quick Evaluation

## 2022-03-11 ENCOUNTER — Encounter: Payer: Self-pay | Admitting: Ophthalmology

## 2022-03-29 ENCOUNTER — Telehealth: Payer: Self-pay

## 2022-03-29 DIAGNOSIS — Z794 Long term (current) use of insulin: Secondary | ICD-10-CM

## 2022-03-29 MED ORDER — DEXCOM G6 TRANSMITTER MISC: 0 refills | Status: DC

## 2022-03-29 MED ORDER — DEXCOM G6 SENSOR MISC: 0 refills | Status: DC

## 2022-03-29 NOTE — Telephone Encounter (Signed)
Called and requested refill for Dexcom sensor and transmitter.

## 2022-04-05 ENCOUNTER — Encounter (INDEPENDENT_AMBULATORY_CARE_PROVIDER_SITE_OTHER): Payer: Commercial Managed Care - HMO | Admitting: Ophthalmology

## 2022-04-05 DIAGNOSIS — H33301 Unspecified retinal break, right eye: Secondary | ICD-10-CM | POA: Diagnosis not present

## 2022-04-05 DIAGNOSIS — H35372 Puckering of macula, left eye: Secondary | ICD-10-CM | POA: Diagnosis not present

## 2022-04-05 DIAGNOSIS — I10 Essential (primary) hypertension: Secondary | ICD-10-CM | POA: Diagnosis not present

## 2022-04-05 DIAGNOSIS — H43813 Vitreous degeneration, bilateral: Secondary | ICD-10-CM | POA: Diagnosis not present

## 2022-04-05 DIAGNOSIS — H338 Other retinal detachments: Secondary | ICD-10-CM

## 2022-04-05 DIAGNOSIS — H35033 Hypertensive retinopathy, bilateral: Secondary | ICD-10-CM

## 2022-04-21 ENCOUNTER — Other Ambulatory Visit: Payer: Self-pay

## 2022-04-21 DIAGNOSIS — Z794 Long term (current) use of insulin: Secondary | ICD-10-CM

## 2022-04-21 MED ORDER — DEXCOM G6 SENSOR MISC: 0 refills | Status: DC

## 2022-04-21 MED ORDER — DEXCOM G6 TRANSMITTER MISC: 0 refills | Status: DC

## 2022-04-25 ENCOUNTER — Other Ambulatory Visit: Payer: Self-pay | Admitting: Internal Medicine

## 2022-04-25 ENCOUNTER — Other Ambulatory Visit: Payer: Self-pay | Admitting: Family Medicine

## 2022-04-25 DIAGNOSIS — I10 Essential (primary) hypertension: Secondary | ICD-10-CM

## 2022-04-25 DIAGNOSIS — E1165 Type 2 diabetes mellitus with hyperglycemia: Secondary | ICD-10-CM

## 2022-04-26 ENCOUNTER — Other Ambulatory Visit: Payer: Self-pay | Admitting: Lab

## 2022-04-26 DIAGNOSIS — I10 Essential (primary) hypertension: Secondary | ICD-10-CM

## 2022-04-26 MED ORDER — METOPROLOL TARTRATE 100 MG PO TABS: ORAL_TABLET | ORAL | 1 refills | Status: DC

## 2022-05-10 ENCOUNTER — Encounter (INDEPENDENT_AMBULATORY_CARE_PROVIDER_SITE_OTHER): Payer: Commercial Managed Care - HMO | Admitting: Ophthalmology

## 2022-05-10 DIAGNOSIS — H33301 Unspecified retinal break, right eye: Secondary | ICD-10-CM

## 2022-05-10 DIAGNOSIS — H59032 Cystoid macular edema following cataract surgery, left eye: Secondary | ICD-10-CM

## 2022-05-10 DIAGNOSIS — I10 Essential (primary) hypertension: Secondary | ICD-10-CM

## 2022-05-10 DIAGNOSIS — H35033 Hypertensive retinopathy, bilateral: Secondary | ICD-10-CM

## 2022-05-10 DIAGNOSIS — H35372 Puckering of macula, left eye: Secondary | ICD-10-CM | POA: Diagnosis not present

## 2022-05-10 DIAGNOSIS — H43813 Vitreous degeneration, bilateral: Secondary | ICD-10-CM

## 2022-05-10 DIAGNOSIS — H338 Other retinal detachments: Secondary | ICD-10-CM

## 2022-05-25 ENCOUNTER — Other Ambulatory Visit: Payer: Self-pay | Admitting: Physician Assistant

## 2022-05-25 ENCOUNTER — Other Ambulatory Visit: Payer: Self-pay

## 2022-05-25 MED ORDER — OMEPRAZOLE 20 MG PO CPDR: DELAYED_RELEASE_CAPSULE | ORAL | 1 refills | Status: DC

## 2022-05-25 NOTE — Telephone Encounter (Signed)
Omeprazole refilled as pharmacy requested.  

## 2022-05-27 ENCOUNTER — Encounter (INDEPENDENT_AMBULATORY_CARE_PROVIDER_SITE_OTHER): Payer: Commercial Managed Care - HMO | Admitting: Ophthalmology

## 2022-06-16 ENCOUNTER — Other Ambulatory Visit: Payer: Self-pay | Admitting: Family Medicine

## 2022-06-16 DIAGNOSIS — I10 Essential (primary) hypertension: Secondary | ICD-10-CM

## 2022-06-24 NOTE — Progress Notes (Signed)
This encounter was created in error - please disregard.

## 2022-07-08 ENCOUNTER — Other Ambulatory Visit: Payer: Self-pay | Admitting: Family Medicine

## 2022-07-08 DIAGNOSIS — I10 Essential (primary) hypertension: Secondary | ICD-10-CM

## 2022-07-15 ENCOUNTER — Encounter (INDEPENDENT_AMBULATORY_CARE_PROVIDER_SITE_OTHER): Payer: Commercial Managed Care - HMO | Admitting: Ophthalmology

## 2022-07-15 ENCOUNTER — Encounter (INDEPENDENT_AMBULATORY_CARE_PROVIDER_SITE_OTHER): Payer: Self-pay

## 2022-08-07 ENCOUNTER — Other Ambulatory Visit: Payer: Self-pay | Admitting: Internal Medicine

## 2022-08-07 DIAGNOSIS — E1165 Type 2 diabetes mellitus with hyperglycemia: Secondary | ICD-10-CM

## 2022-08-13 ENCOUNTER — Other Ambulatory Visit: Payer: Self-pay

## 2022-08-13 DIAGNOSIS — Z794 Long term (current) use of insulin: Secondary | ICD-10-CM

## 2022-08-19 ENCOUNTER — Other Ambulatory Visit: Payer: Self-pay | Admitting: *Deleted

## 2022-08-19 DIAGNOSIS — E1165 Type 2 diabetes mellitus with hyperglycemia: Secondary | ICD-10-CM

## 2022-08-19 MED ORDER — DEXCOM G6 SENSOR MISC: 0 refills | Status: DC

## 2022-09-09 ENCOUNTER — Other Ambulatory Visit: Payer: Self-pay | Admitting: Family Medicine

## 2022-09-09 DIAGNOSIS — E785 Hyperlipidemia, unspecified: Secondary | ICD-10-CM

## 2022-09-15 ENCOUNTER — Telehealth: Payer: Self-pay

## 2022-09-15 NOTE — Telephone Encounter (Signed)
PA renewal request received via CMM for Ozempic (1 MG/DOSE) /3ML pen-injectors  Key: [id number]  No office visit within 1 year, PA not submitted  New PA will need to be created if needed

## 2022-09-23 ENCOUNTER — Telehealth: Payer: Self-pay

## 2022-09-23 ENCOUNTER — Other Ambulatory Visit (HOSPITAL_COMMUNITY): Payer: Self-pay

## 2022-09-23 NOTE — Telephone Encounter (Signed)
Raymond Pugh insurance has been added and verified. Patients wife is asking for refills on all DM medication.  Pharm[facility] is [facility] [address]

## 2022-09-23 NOTE — Telephone Encounter (Signed)
Pt has new [employer] and needs a PA for his Dexcom G6 sensors and transmitters.

## 2022-09-23 NOTE — Telephone Encounter (Signed)
Patient Advocate Encounter   Received notification from pt msgs that prior authorization is required for Dexcom G6 sensor  Per PA form:     Documentation from within the last 6 months is needed to submit. His previous labs and chart notes were over a year ago.   PA not submitted

## 2022-09-23 NOTE — Telephone Encounter (Signed)
Pt advised office visit needed before authorization will be processed by insurance.

## 2022-09-23 NOTE — Telephone Encounter (Signed)
Pt needs to be seen for refills

## 2022-09-28 ENCOUNTER — Other Ambulatory Visit: Payer: Self-pay | Admitting: Internal Medicine

## 2022-09-28 ENCOUNTER — Ambulatory Visit: Payer: 59 | Admitting: Internal Medicine

## 2022-09-28 ENCOUNTER — Encounter: Payer: Self-pay | Admitting: Internal Medicine

## 2022-09-28 VITALS — BP 130/84 | HR 73 | Ht 72.0 in | Wt 219.4 lb

## 2022-09-28 DIAGNOSIS — E119 Type 2 diabetes mellitus without complications: Secondary | ICD-10-CM

## 2022-09-28 DIAGNOSIS — E1165 Type 2 diabetes mellitus with hyperglycemia: Secondary | ICD-10-CM

## 2022-09-28 DIAGNOSIS — E663 Overweight: Secondary | ICD-10-CM | POA: Diagnosis not present

## 2022-09-28 DIAGNOSIS — Z794 Long term (current) use of insulin: Secondary | ICD-10-CM

## 2022-09-28 DIAGNOSIS — Z7984 Long term (current) use of oral hypoglycemic drugs: Secondary | ICD-10-CM | POA: Diagnosis not present

## 2022-09-28 DIAGNOSIS — Z7985 Long-term (current) use of injectable non-insulin antidiabetic drugs: Secondary | ICD-10-CM | POA: Diagnosis not present

## 2022-09-28 DIAGNOSIS — E785 Hyperlipidemia, unspecified: Secondary | ICD-10-CM

## 2022-09-28 LAB — POCT GLYCOSYLATED HEMOGLOBIN (HGB A1C): Hemoglobin A1C: 8.9 % — AB (ref 4.0–5.6)

## 2022-09-28 MED ORDER — SEMAGLUTIDE(0.25 OR 0.5MG/DOS) 2 MG/3ML ~~LOC~~ SOPN: 0.5000 mg | PEN_INJECTOR | SUBCUTANEOUS | 3 refills | Status: DC

## 2022-09-28 MED ORDER — DEXCOM G6 SENSOR MISC: 3 refills | Status: DC

## 2022-09-28 MED ORDER — METFORMIN HCL 1000 MG PO TABS: ORAL_TABLET | ORAL | 3 refills | Status: DC

## 2022-09-28 MED ORDER — LANTUS SOLOSTAR 100 UNIT/ML ~~LOC~~ SOPN: 20.0000 [IU] | PEN_INJECTOR | Freq: Every day | SUBCUTANEOUS | 3 refills | Status: DC

## 2022-09-28 NOTE — Patient Instructions (Addendum)
Please continue: - Metformin 1000 mg 2x a day - R insulin 30 minutes before meals: 16-20 units  - Ozempic 0.5 mg weekly  Try to restart: - Lantus 20 units in am  TRY TO SCHEDULE A NEW EYE EXAM!  NO SNACKING.  Please come back in 4 months.
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
## 2022-09-29 ENCOUNTER — Encounter: Payer: Self-pay | Admitting: Internal Medicine

## 2022-09-30 ENCOUNTER — Telehealth: Payer: Self-pay

## 2022-09-30 NOTE — Telephone Encounter (Signed)
Patient Advocate Encounter   Received notification from pt msgs that prior authorization is required for Dexcom G6 sensor  Submitted: 09/30/22 Key [id number]  Status is pending

## 2022-09-30 NOTE — Telephone Encounter (Signed)
Patient Advocate Encounter   Received notification from pt msgs that prior authorization is required for Ozempic  Submitted: 09/30/22 Key [id number]  Status is pending

## 2022-10-05 ENCOUNTER — Other Ambulatory Visit: Payer: Self-pay

## 2022-10-05 ENCOUNTER — Encounter: Payer: Self-pay | Admitting: Internal Medicine

## 2022-10-05 DIAGNOSIS — E1165 Type 2 diabetes mellitus with hyperglycemia: Secondary | ICD-10-CM

## 2022-10-05 MED ORDER — DEXCOM G6 TRANSMITTER MISC: 3 refills | Status: DC

## 2022-10-05 NOTE — Telephone Encounter (Signed)
Patient Advocate Encounter  Prior Authorization for Dexcom G6 Sensor has been approved through [employer].    Effective: 10-02-2022 to 10-02-2023

## 2022-10-05 NOTE — Telephone Encounter (Signed)
Patient Advocate Encounter  Prior Authorization for Ozempic has been approved through [employer].    Effective: 10-02-2022 to 10-02-2023

## 2022-10-19 ENCOUNTER — Encounter: Payer: Self-pay | Admitting: Family Medicine

## 2022-10-29 ENCOUNTER — Other Ambulatory Visit (HOSPITAL_COMMUNITY): Payer: Self-pay

## 2022-11-08 ENCOUNTER — Other Ambulatory Visit: Payer: Self-pay | Admitting: Family Medicine

## 2022-11-08 DIAGNOSIS — I10 Essential (primary) hypertension: Secondary | ICD-10-CM

## 2022-11-08 NOTE — Telephone Encounter (Signed)
Pt has cpe appt 6/19, will address refills then.

## 2022-11-10 ENCOUNTER — Encounter: Payer: Self-pay | Admitting: Family Medicine

## 2022-11-10 ENCOUNTER — Other Ambulatory Visit: Payer: 59

## 2022-11-10 ENCOUNTER — Ambulatory Visit (INDEPENDENT_AMBULATORY_CARE_PROVIDER_SITE_OTHER): Payer: 59 | Admitting: Family Medicine

## 2022-11-10 VITALS — BP 130/72 | HR 66 | Temp 98.0°F | Ht 67.75 in | Wt 217.8 lb

## 2022-11-10 DIAGNOSIS — Z0000 Encounter for general adult medical examination without abnormal findings: Secondary | ICD-10-CM

## 2022-11-10 DIAGNOSIS — Z794 Long term (current) use of insulin: Secondary | ICD-10-CM

## 2022-11-10 DIAGNOSIS — H9193 Unspecified hearing loss, bilateral: Secondary | ICD-10-CM

## 2022-11-10 DIAGNOSIS — I10 Essential (primary) hypertension: Secondary | ICD-10-CM

## 2022-11-10 DIAGNOSIS — E785 Hyperlipidemia, unspecified: Secondary | ICD-10-CM

## 2022-11-10 DIAGNOSIS — E1165 Type 2 diabetes mellitus with hyperglycemia: Secondary | ICD-10-CM

## 2022-11-10 DIAGNOSIS — Z125 Encounter for screening for malignant neoplasm of prostate: Secondary | ICD-10-CM

## 2022-11-10 DIAGNOSIS — E1142 Type 2 diabetes mellitus with diabetic polyneuropathy: Secondary | ICD-10-CM

## 2022-11-10 LAB — COMPREHENSIVE METABOLIC PANEL
ALT: 23 U/L (ref 0–53)
AST: 20 U/L (ref 0–37)
Albumin: 3.7 g/dL (ref 3.5–5.2)
Alkaline Phosphatase: 77 U/L (ref 39–117)
BUN: 16 mg/dL (ref 6–23)
CO2: 31 mEq/L (ref 19–32)
Calcium: 8.6 mg/dL (ref 8.4–10.5)
Chloride: 100 mEq/L (ref 96–112)
Creatinine, Ser: 1.25 mg/dL (ref 0.40–1.50)
GFR: 62.04 mL/min (ref >60.00)
Glucose, Bld: 212 mg/dL — ABNORMAL HIGH (ref 70–99)
Potassium: 3.5 mEq/L (ref 3.5–5.1)
Sodium: 141 mEq/L (ref 135–145)
Total Bilirubin: 0.4 mg/dL (ref 0.2–1.2)
Total Protein: 7.1 g/dL (ref 6.0–8.3)

## 2022-11-10 LAB — LIPID PANEL
Cholesterol: 139 mg/dL (ref 0–200)
HDL: 42.9 mg/dL (ref >39.00)
LDL Cholesterol: 78 mg/dL (ref 0–99)
NonHDL: 96.44
Total CHOL/HDL Ratio: 3
Triglycerides: 91 mg/dL (ref 0.0–149.0)
VLDL: 18.2 mg/dL (ref 0.0–40.0)

## 2022-11-10 LAB — MICROALBUMIN / CREATININE URINE RATIO
Creatinine,U: 344.7 mg/dL
Microalb Creat Ratio: 1.1 mg/g (ref 0.0–30.0)
Microalb, Ur: 3.9 mg/dL — ABNORMAL HIGH (ref 0.0–1.9)

## 2022-11-10 MED ORDER — HYDROCHLOROTHIAZIDE 25 MG PO TABS: ORAL_TABLET | ORAL | 1 refills | Status: DC

## 2022-11-10 MED ORDER — RAMIPRIL 10 MG PO CAPS: ORAL_CAPSULE | ORAL | 1 refills | Status: DC

## 2022-11-10 MED ORDER — METOPROLOL TARTRATE 100 MG PO TABS: ORAL_TABLET | ORAL | 1 refills | Status: DC

## 2022-11-10 NOTE — Patient Instructions (Addendum)
I will refer you for hearing screening.  No med changes for now.  Keep up the good work with exercise.   Take care!  There is a recommendation for RSV vaccine for patients over age 62.   Typically would recommend the RSV vaccine as most important for patients over age 55 that have comorbidities that put them at increased risk for severe disease (heart disease such as congestive heart failure, coronary artery disease, lung disease such as asthma or COPD, kidney disease, liver disease, diabetes, chronic or progressive neurologic or muscular conditions, immunosuppressed, or being frail or of advanced age).  For others that do not have these risk factors, there still is some benefit from vaccination since age is one of the main risk factors for developing severe disease however baseline risk of developing severe disease and requiring hospitalization is likely to be lower compared to those that have comorbidities in addition to age.   CDC does have some information as well: [employer]    Preventive Care 86-62 Years Old, Male Preventive care refers to lifestyle choices and visits with your health care provider that can promote health and wellness. Preventive care visits are also called wellness exams. What can I expect for my preventive care visit? Counseling During your preventive care visit, your health care provider may ask about your: Medical history, including: Past medical problems. Family medical history. Current health, including: Emotional well-being. Home life and relationship well-being. Sexual activity. Lifestyle, including: Alcohol, nicotine or tobacco, and drug use. Access to firearms. Diet, exercise, and sleep habits. Safety issues such as seatbelt and bike helmet use. Sunscreen use. Work and work Astronomer. Physical exam Your health care provider will check your: Height and weight. These may be used to calculate your BMI  (body mass index). BMI is a measurement that tells if you are at a healthy weight. Waist circumference. This measures the distance around your waistline. This measurement also tells if you are at a healthy weight and may help predict your risk of certain diseases, such as type 2 diabetes and high blood pressure. Heart rate and blood pressure. Body temperature. Skin for abnormal spots. What immunizations do I need?  Vaccines are usually given at various ages, according to a schedule. Your health care provider will recommend vaccines for you based on your age, medical history, and lifestyle or other factors, such as travel or where you work. What tests do I need? Screening Your health care provider may recommend screening tests for certain conditions. This may include: Lipid and cholesterol levels. Diabetes screening. This is done by checking your blood sugar (glucose) after you have not eaten for a while (fasting). Hepatitis B test. Hepatitis C test. HIV (human immunodeficiency virus) test. STI (sexually transmitted infection) testing, if you are at risk. Lung cancer screening. Prostate cancer screening. Colorectal cancer screening. Talk with your health care provider about your test results, treatment options, and if necessary, the need for more tests. Follow these instructions at home: Eating and drinking  Eat a diet that includes fresh fruits and vegetables, whole grains, lean protein, and low-fat dairy products. Take vitamin and mineral supplements as recommended by your health care provider. Do not drink alcohol if your health care provider tells you not to drink. If you drink alcohol: Limit how much you have to 0-2 drinks a day. Know how much alcohol is in your drink. In the [place] one drink equals one 12 oz bottle of beer (355 mL), one 5 oz glass of wine (148 mL),  or one 1 oz glass of hard liquor (44 mL). Lifestyle Brush your teeth every morning and night with fluoride  toothpaste. Floss one time each day. Exercise for at least 30 minutes 5 or more days each week. Do not use any products that contain nicotine or tobacco. These products include cigarettes, chewing tobacco, and vaping devices, such as e-cigarettes. If you need help quitting, ask your health care provider. Do not use drugs. If you are sexually active, practice safe sex. Use a condom or other form of protection to prevent STIs. Take aspirin only as told by your health care provider. Make sure that you understand how much to take and what form to take. Work with your health care provider to find out whether it is safe and beneficial for you to take aspirin daily. Find healthy ways to manage stress, such as: Meditation, yoga, or listening to music. Journaling. Talking to a trusted person. Spending time with friends and family. Minimize exposure to UV radiation to reduce your risk of skin cancer. Safety Always wear your seat belt while driving or riding in a vehicle. Do not drive: If you have been drinking alcohol. Do not ride with someone who has been drinking. When you are tired or distracted. While texting. If you have been using any mind-altering substances or drugs. Wear a helmet and other protective equipment during sports activities. If you have firearms in your house, make sure you follow all gun safety procedures. What's next? Go to your health care provider once a year for an annual wellness visit. Ask your health care provider how often you should have your eyes and teeth checked. Stay up to date on all vaccines. This information is not intended to replace advice given to you by your health care provider. Make sure you discuss any questions you have with your health care provider. Document Revised: 11/05/2020 Document Reviewed: 11/05/2020 [employer] Patient Education  2024 [employer].
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
## 2022-12-01 ENCOUNTER — Ambulatory Visit: Payer: 59 | Attending: Family Medicine | Admitting: Audiologist

## 2022-12-06 ENCOUNTER — Other Ambulatory Visit: Payer: Self-pay | Admitting: Internal Medicine

## 2022-12-06 ENCOUNTER — Ambulatory Visit: Payer: 59 | Admitting: Podiatry

## 2022-12-06 DIAGNOSIS — L03032 Cellulitis of left toe: Secondary | ICD-10-CM | POA: Diagnosis not present

## 2022-12-06 DIAGNOSIS — L02612 Cutaneous abscess of left foot: Secondary | ICD-10-CM | POA: Diagnosis not present
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
## 2022-12-06 MED ORDER — DOXYCYCLINE HYCLATE 100 MG PO TABS: 100.0000 mg | ORAL_TABLET | Freq: Two times a day (BID) | ORAL | 1 refills | Status: DC
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
## 2023-01-20 IMAGING — DX DG CHEST 1V PORT
1 series · 1 of 1 positions shown · non-contrast
Comparison: Radiographs 04/25/2021 and 11/29/2017.  CT 04/25/2021.

CLINICAL DATA: Status post bronchoscopy.

EXAM:
PORTABLE CHEST 1 VIEW

[chest]
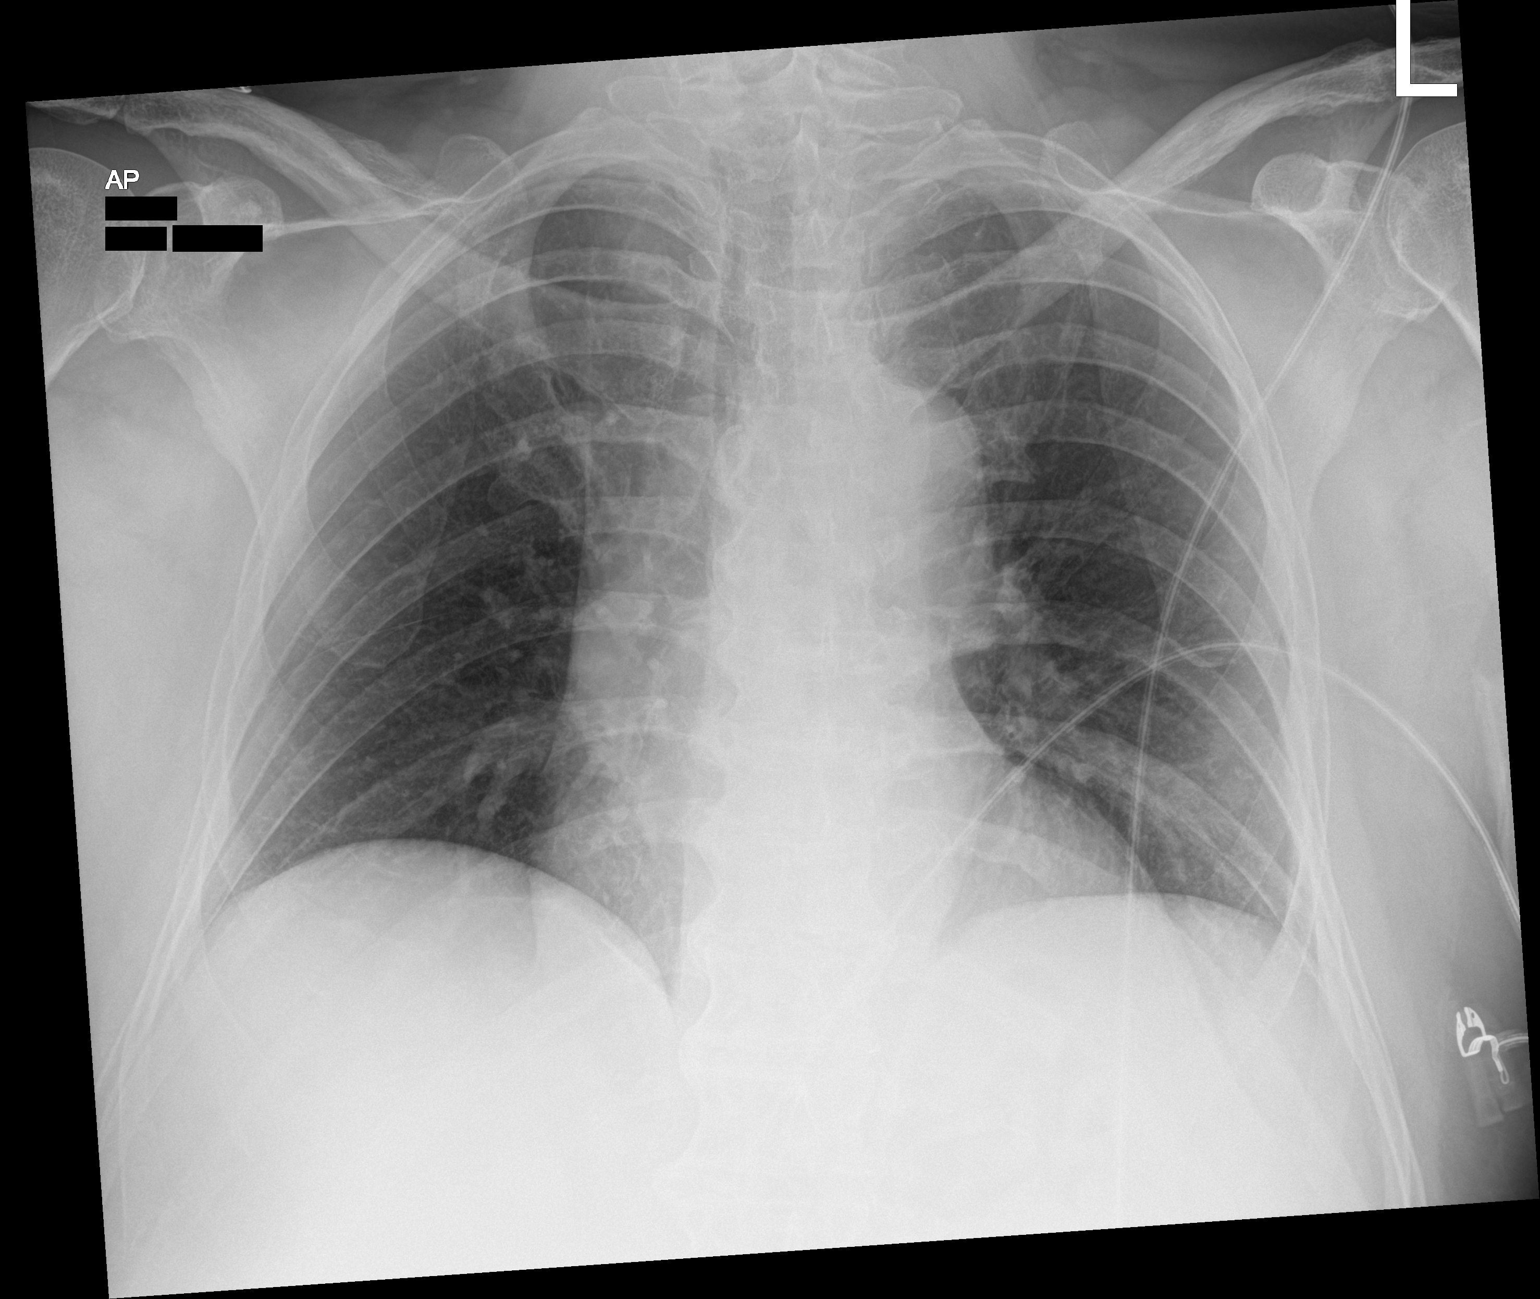

[1 of 1 positions shown; findings below may reference images not displayed]

FINDINGS: 4971 hours. Lower lung volumes and mild patient rotation to the
right. The heart size and mediastinal contours are stable without
evidence of pneumomediastinum. The lungs appear clear; part solid
lingular nodule is not clearly seen. There is no pleural effusion or
pneumothorax. The bones appear unchanged. Telemetry leads overlie
the chest.
IMPRESSION: No demonstrated complication following bronchoscopy.

## 2023-02-01 ENCOUNTER — Ambulatory Visit: Payer: 59 | Admitting: Internal Medicine

## 2023-02-01 ENCOUNTER — Encounter: Payer: Self-pay | Admitting: Internal Medicine

## 2023-02-01 VITALS — BP 150/80 | HR 107 | Ht 67.75 in | Wt 225.6 lb

## 2023-02-01 DIAGNOSIS — E663 Overweight: Secondary | ICD-10-CM | POA: Diagnosis not present

## 2023-02-01 DIAGNOSIS — E1165 Type 2 diabetes mellitus with hyperglycemia: Secondary | ICD-10-CM

## 2023-02-01 DIAGNOSIS — Z7984 Long term (current) use of oral hypoglycemic drugs: Secondary | ICD-10-CM | POA: Diagnosis not present

## 2023-02-01 DIAGNOSIS — Z794 Long term (current) use of insulin: Secondary | ICD-10-CM | POA: Diagnosis not present

## 2023-02-01 DIAGNOSIS — E785 Hyperlipidemia, unspecified: Secondary | ICD-10-CM

## 2023-02-01 DIAGNOSIS — Z7985 Long-term (current) use of injectable non-insulin antidiabetic drugs: Secondary | ICD-10-CM

## 2023-02-01 LAB — POCT GLYCOSYLATED HEMOGLOBIN (HGB A1C): Hemoglobin A1C: 10.5 % — AB (ref 4.0–5.6)

## 2023-02-01 MED ORDER — BASAGLAR KWIKPEN 100 UNIT/ML ~~LOC~~ SOPN: 30.0000 [IU] | PEN_INJECTOR | Freq: Every day | SUBCUTANEOUS | Status: DC

## 2023-02-01 NOTE — Patient Instructions (Addendum)
Please use: - Metformin 1000 mg 2x a day - R insulin 30 minutes before meals: 20-30 units, and 10 unit for a snack - Ozempic 0.5 mg weekly  Please increase: - Lantus 30 units in am  TRY TO SCHEDULE A NEW EYE EXAM!  TRY NO TO SNACK!  Please come back in 1.5-2 months.
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
## 2023-03-17 ENCOUNTER — Ambulatory Visit: Payer: 59 | Admitting: Internal Medicine
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
## 2023-04-04 ENCOUNTER — Encounter: Payer: Self-pay | Admitting: Family Medicine

## 2023-04-04 DIAGNOSIS — E1142 Type 2 diabetes mellitus with diabetic polyneuropathy: Secondary | ICD-10-CM

## 2023-04-04 DIAGNOSIS — E785 Hyperlipidemia, unspecified: Secondary | ICD-10-CM

## 2023-04-04 DIAGNOSIS — I10 Essential (primary) hypertension: Secondary | ICD-10-CM

## 2023-04-04 DIAGNOSIS — F418 Other specified anxiety disorders: Secondary | ICD-10-CM

## 2023-04-05 MED ORDER — HYDROCHLOROTHIAZIDE 25 MG PO TABS
ORAL_TABLET | ORAL | 1 refills | Status: AC
Start: 2023-04-05 — End: ?

## 2023-04-05 MED ORDER — LOVASTATIN 20 MG PO TABS: 20.0000 mg | ORAL_TABLET | Freq: Every day | ORAL | 1 refills | Status: AC

## 2023-04-05 MED ORDER — GABAPENTIN 100 MG PO CAPS: ORAL_CAPSULE | ORAL | 1 refills | Status: AC

## 2023-04-05 MED ORDER — METOPROLOL TARTRATE 100 MG PO TABS: ORAL_TABLET | ORAL | 1 refills | Status: AC

## 2023-04-05 MED ORDER — RAMIPRIL 10 MG PO CAPS: ORAL_CAPSULE | ORAL | 1 refills | Status: DC

## 2023-04-05 MED ORDER — ALPRAZOLAM 0.5 MG PO TABS: 0.5000 mg | ORAL_TABLET | Freq: Two times a day (BID) | ORAL | 0 refills | Status: DC | PRN

## 2023-04-05 NOTE — Telephone Encounter (Signed)
Pt had appt 11/10/2022 for physical, pt requesting all meds from you be sent at the same time

## 2023-04-05 NOTE — Telephone Encounter (Signed)
Physical exam June 19.  Medications discussed at that time.  Controlled substance database reviewed.  Gabapentin most recently filled #[id number] on 09/24/2022.  No recent alprazolam prescription filled.  We have discussed this previously with rare use as needed for situational stress.  Will refill temporarily but if frequent need, we will need to discuss at office visit.

## 2023-04-29 ENCOUNTER — Encounter: Payer: Self-pay | Admitting: Family Medicine

## 2023-04-29 ENCOUNTER — Telehealth: Payer: Self-pay | Admitting: Family Medicine

## 2023-04-29 NOTE — Telephone Encounter (Signed)
No response from Dr. Darrol Jump.  Initially am not sure about the message of me no longer being at the practice.  I am still at [facility], but not sure if they were looking to find a different location to be seen.  I am okay with transfer if needed.

## 2023-04-29 NOTE — Telephone Encounter (Signed)
Pt wife called and would like TOC  to dr Veto Kemps because the pr previous pcp jeffrey greene is no longer at the practice

## 2023-05-02 NOTE — Progress Notes (Unsigned)
Aleen Sells D.[facility] [address] [place] [address] Phone: [phone]   Assessment and Plan:     There are no diagnoses linked to this encounter.  ***   Pertinent previous records reviewed include ***    Follow Up: ***     Subjective:   I, Kati Riggenbach, am serving as a Neurosurgeon for Doctor Richardean Sale  Chief Complaint: neck pain   HPI:   05/03/2023 Patient is a 62 year old male with neck pain. Patient states  Relevant Historical Information: ***  Additional pertinent review of systems negative.   Current Outpatient Medications:    ALPRAZolam (XANAX) 0.5 MG tablet, Take 1 tablet (0.5 mg total) by mouth 2 (two) times daily as needed for anxiety., Disp: 20 tablet, Rfl: 0   amphetamine-dextroamphetamine (ADDERALL XR) 20 MG 24 hr capsule, Take 1 Capsules by mouth every day prn, Disp: 30 capsule, Rfl: 0   aspirin EC 81 MG tablet, Take 81 mg by mouth in the morning and at bedtime. Swallow whole., Disp: , Rfl:    Continuous Blood Gluc Receiver (DEXCOM G6 RECEIVER) DEVI, Use as instructed to check blood sugar, Disp: 1 each, Rfl: 0   Continuous Glucose Sensor (DEXCOM G6 SENSOR) MISC, Use as instructed to check blood sugar change every 10 days, Disp: 9 each, Rfl: 3   Continuous Glucose Transmitter (DEXCOM G6 TRANSMITTER) MISC, Change every 90 days, Disp: 1 each, Rfl: 3   gabapentin (NEURONTIN) 100 MG capsule, TAKE 1 CAPSULE(100 MG) BY MOUTH TWICE DAILY, Disp: 180 capsule, Rfl: 1   hydrochlorothiazide (HYDRODIURIL) 25 MG tablet, TAKE 1 TABLET(25 MG) BY MOUTH DAILY, Disp: 90 tablet, Rfl: 1   Insulin Glargine (BASAGLAR KWIKPEN) 100 UNIT/ML, Inject 30 Units into the skin at bedtime., Disp: , Rfl:    insulin regular (NOVOLIN R RELION) 100 units/mL injection, Inject 0.16-0.2 mLs (16-20 Units total) into the skin 3 (three) times daily before meals. NO FURTHER REFILLS WITHOUT APPOINTMENT (Patient taking differently: Inject 16-25 Units  into the skin 3 (three) times daily before meals. Sliding scale), Disp: 20 mL, Rfl: 0   Insulin Syringe-Needle U-100 (RELION INSULIN SYRINGE) 31G X 15/64" 0.5 ML MISC, Use to inject insulin 3 times a day. NO REFILLS WITHOUT APPOINTMENT, Disp: 300 each, Rfl: 0   Lancets (ONETOUCH ULTRASOFT) lancets, Use to check blood sugar 3 times a day, Disp: 100 each, Rfl: 0   lovastatin (MEVACOR) 20 MG tablet, Take 1 tablet (20 mg total) by mouth at bedtime. TAKE 1 TABLET(20 MG) BY MOUTH AT BEDTIME, Disp: 90 tablet, Rfl: 1   metFORMIN (GLUCOPHAGE) 1000 MG tablet, TAKE 1 TABLET BY MOUTH TWICE DAILY, Disp: 180 tablet, Rfl: 3   metoprolol tartrate (LOPRESSOR) 100 MG tablet, TAKE 1 TABLET(100 MG) BY MOUTH TWICE DAILY, Disp: 180 tablet, Rfl: 1   omeprazole (PRILOSEC) 20 MG capsule, TAKE 1 CAPSULE BY MOUTH IN THE MORNING, Disp: 90 capsule, Rfl: 1   ramipril (ALTACE) 10 MG capsule, TAKE 1 CAPSULE(10 MG) BY MOUTH TWICE DAILY, Disp: 180 capsule, Rfl: 1   Semaglutide,0.25 or 0.5MG /DOS, 2 MG/3ML SOPN, Inject 0.5 mg into the skin once a week., Disp: 9 mL, Rfl: 3   triamcinolone (KENALOG) 0.1 % paste, Use as directed 1 Application in the mouth or throat 2 (two) times daily. To tongue., Disp: 5 g, Rfl: 0   Objective:     There were no vitals filed for this visit.    There is no height or weight on file  to calculate BMI.    Physical Exam:    ***   Electronically signed by:  Aleen Sells D.[facility] 12:07 PM 05/02/23

## 2023-05-03 ENCOUNTER — Telehealth: Payer: Self-pay | Admitting: Family Medicine

## 2023-05-03 ENCOUNTER — Ambulatory Visit (INDEPENDENT_AMBULATORY_CARE_PROVIDER_SITE_OTHER): Payer: 59 | Admitting: Sports Medicine

## 2023-05-03 ENCOUNTER — Ambulatory Visit (INDEPENDENT_AMBULATORY_CARE_PROVIDER_SITE_OTHER): Payer: 59

## 2023-05-03 VITALS — HR 86 | Ht 67.0 in | Wt 228.0 lb

## 2023-05-03 DIAGNOSIS — M542 Cervicalgia: Secondary | ICD-10-CM

## 2023-05-03 DIAGNOSIS — M5030 Other cervical disc degeneration, unspecified cervical region: Secondary | ICD-10-CM | POA: Diagnosis not present

## 2023-05-03 MED ORDER — MELOXICAM 15 MG PO TABS: 15.0000 mg | ORAL_TABLET | Freq: Every day | ORAL | 0 refills | Status: DC

## 2023-05-03 NOTE — Telephone Encounter (Signed)
Lvm stated that the patient TOC was denied from dr Veto Kemps

## 2023-05-03 NOTE — Patient Instructions (Signed)
-   Start meloxicam 15 mg daily x2 weeks.  If still having pain after 2 weeks, complete 3rd-week of NSAID. May use remaining NSAID as needed once daily for pain control.  Do not to use additional over-the-counter NSAIDs (ibuprofen, naproxen, Advil, Aleve) while taking prescription NSAIDs.  May use Tylenol [phone] mg 2 to 3 times a day for breakthrough pain. Neck HEP  PT referral 4 week follow up

## 2023-05-10 ENCOUNTER — Telehealth: Payer: Self-pay | Admitting: Family Medicine

## 2023-05-10 NOTE — Telephone Encounter (Signed)
Type of form received:Recall [employer]  Additional comments: [facility]  Received by: [employer]   Form should be Faxed/mailed to: FYI  Is patient requesting call for pickup: N/A  Form placed:  [employer].  Provider will determine charge. N/A  Individual made aware of 3-5 business day turn around No?

## 2023-05-10 NOTE — Telephone Encounter (Signed)
Placed in your review folder at the nurse station

## 2023-05-12 ENCOUNTER — Ambulatory Visit: Payer: 59 | Admitting: Family Medicine

## 2023-05-19 ENCOUNTER — Emergency Department (HOSPITAL_COMMUNITY): Payer: 59

## 2023-05-19 ENCOUNTER — Other Ambulatory Visit: Payer: Self-pay

## 2023-05-19 ENCOUNTER — Inpatient Hospital Stay (HOSPITAL_COMMUNITY)
Admission: EM | Admit: 2023-05-19 | Discharge: 2023-05-24 | DRG: 256 | Disposition: A | Discharge status: Home / Self Care | Payer: 59 | Attending: Internal Medicine | Admitting: Internal Medicine

## 2023-05-19 DIAGNOSIS — E785 Hyperlipidemia, unspecified: Secondary | ICD-10-CM | POA: Diagnosis present

## 2023-05-19 DIAGNOSIS — I10 Essential (primary) hypertension: Secondary | ICD-10-CM | POA: Diagnosis present

## 2023-05-19 DIAGNOSIS — Z791 Long term (current) use of non-steroidal anti-inflammatories (NSAID): Secondary | ICD-10-CM

## 2023-05-19 DIAGNOSIS — F909 Attention-deficit hyperactivity disorder, unspecified type: Secondary | ICD-10-CM | POA: Diagnosis present

## 2023-05-19 DIAGNOSIS — Z794 Long term (current) use of insulin: Secondary | ICD-10-CM

## 2023-05-19 DIAGNOSIS — L089 Local infection of the skin and subcutaneous tissue, unspecified: Principal | ICD-10-CM | POA: Diagnosis present

## 2023-05-19 DIAGNOSIS — L03031 Cellulitis of right toe: Secondary | ICD-10-CM | POA: Diagnosis present

## 2023-05-19 DIAGNOSIS — Z6836 Body mass index (BMI) 36.0-36.9, adult: Secondary | ICD-10-CM

## 2023-05-19 DIAGNOSIS — E11628 Type 2 diabetes mellitus with other skin complications: Secondary | ICD-10-CM | POA: Diagnosis not present

## 2023-05-19 DIAGNOSIS — Z91041 Radiographic dye allergy status: Secondary | ICD-10-CM

## 2023-05-19 DIAGNOSIS — E1142 Type 2 diabetes mellitus with diabetic polyneuropathy: Secondary | ICD-10-CM

## 2023-05-19 DIAGNOSIS — S99921A Unspecified injury of right foot, initial encounter: Secondary | ICD-10-CM | POA: Diagnosis present

## 2023-05-19 DIAGNOSIS — B9561 Methicillin susceptible Staphylococcus aureus infection as the cause of diseases classified elsewhere: Secondary | ICD-10-CM | POA: Diagnosis present

## 2023-05-19 DIAGNOSIS — E66812 Obesity, class 2: Secondary | ICD-10-CM | POA: Diagnosis present

## 2023-05-19 DIAGNOSIS — Z8249 Family history of ischemic heart disease and other diseases of the circulatory system: Secondary | ICD-10-CM

## 2023-05-19 DIAGNOSIS — E1152 Type 2 diabetes mellitus with diabetic peripheral angiopathy with gangrene: Secondary | ICD-10-CM | POA: Diagnosis not present

## 2023-05-19 DIAGNOSIS — M869 Osteomyelitis, unspecified: Secondary | ICD-10-CM

## 2023-05-19 DIAGNOSIS — E1169 Type 2 diabetes mellitus with other specified complication: Secondary | ICD-10-CM | POA: Diagnosis present

## 2023-05-19 DIAGNOSIS — Z825 Family history of asthma and other chronic lower respiratory diseases: Secondary | ICD-10-CM

## 2023-05-19 DIAGNOSIS — E669 Obesity, unspecified: Secondary | ICD-10-CM | POA: Diagnosis present

## 2023-05-19 DIAGNOSIS — Z79899 Other long term (current) drug therapy: Secondary | ICD-10-CM

## 2023-05-19 DIAGNOSIS — Z7985 Long-term (current) use of injectable non-insulin antidiabetic drugs: Secondary | ICD-10-CM

## 2023-05-19 DIAGNOSIS — Z833 Family history of diabetes mellitus: Secondary | ICD-10-CM

## 2023-05-19 DIAGNOSIS — Z885 Allergy status to narcotic agent status: Secondary | ICD-10-CM

## 2023-05-19 DIAGNOSIS — L97519 Non-pressure chronic ulcer of other part of right foot with unspecified severity: Secondary | ICD-10-CM | POA: Diagnosis present

## 2023-05-19 DIAGNOSIS — E1165 Type 2 diabetes mellitus with hyperglycemia: Secondary | ICD-10-CM | POA: Diagnosis present

## 2023-05-19 DIAGNOSIS — Z83719 Family history of colon polyps, unspecified: Secondary | ICD-10-CM

## 2023-05-19 DIAGNOSIS — W228XXA Striking against or struck by other objects, initial encounter: Secondary | ICD-10-CM | POA: Diagnosis present

## 2023-05-19 DIAGNOSIS — F419 Anxiety disorder, unspecified: Secondary | ICD-10-CM | POA: Diagnosis present

## 2023-05-19 DIAGNOSIS — Z7982 Long term (current) use of aspirin: Secondary | ICD-10-CM

## 2023-05-19 DIAGNOSIS — E1140 Type 2 diabetes mellitus with diabetic neuropathy, unspecified: Secondary | ICD-10-CM | POA: Diagnosis present

## 2023-05-19 DIAGNOSIS — M868X7 Other osteomyelitis, ankle and foot: Secondary | ICD-10-CM | POA: Diagnosis present

## 2023-05-19 LAB — COMPREHENSIVE METABOLIC PANEL
ALT: 27 U/L (ref 0–44)
AST: 27 U/L (ref 15–41)
Albumin: 3 g/dL — ABNORMAL LOW (ref 3.5–5.0)
Alkaline Phosphatase: 82 U/L (ref 38–126)
Anion gap: 8 (ref 5–15)
BUN: 16 mg/dL (ref 8–23)
CO2: 26 mmol/L (ref 22–32)
Calcium: 8.9 mg/dL (ref 8.9–10.3)
Chloride: 103 mmol/L (ref 98–111)
Creatinine, Ser: 0.88 mg/dL (ref 0.61–1.24)
GFR, Estimated: >60 mL/min (ref >60)
Glucose, Bld: 151 mg/dL — ABNORMAL HIGH (ref 70–99)
Potassium: 3.3 mmol/L — ABNORMAL LOW (ref 3.5–5.1)
Sodium: 137 mmol/L (ref 135–145)
Total Bilirubin: 0.5 mg/dL (ref <1.2)
Total Protein: 6.4 g/dL — ABNORMAL LOW (ref 6.5–8.1)

## 2023-05-19 LAB — CBC WITH DIFFERENTIAL/PLATELET
Abs Immature Granulocytes: 0.03 10*3/uL (ref 0.00–0.07)
Basophils Absolute: 0.1 10*3/uL (ref 0.0–0.1)
Basophils Relative: 1 %
Eosinophils Absolute: 0.8 10*3/uL — ABNORMAL HIGH (ref 0.0–0.5)
Eosinophils Relative: 8 %
HCT: 40.2 % (ref 39.0–52.0)
Hemoglobin: 13 g/dL (ref 13.0–17.0)
Immature Granulocytes: 0 %
Lymphocytes Relative: 23 %
Lymphs Abs: 2.5 10*3/uL (ref 0.7–4.0)
MCH: 30 pg (ref 26.0–34.0)
MCHC: 32.3 g/dL (ref 30.0–36.0)
MCV: 92.6 fL (ref 80.0–100.0)
Monocytes Absolute: 0.7 10*3/uL (ref 0.1–1.0)
Monocytes Relative: 6 %
Neutro Abs: 6.6 10*3/uL (ref 1.7–7.7)
Neutrophils Relative %: 62 %
Platelets: 263 10*3/uL (ref 150–400)
RBC: 4.34 MIL/uL (ref 4.22–5.81)
RDW: 13.9 % (ref 11.5–15.5)
WBC: 10.7 10*3/uL — ABNORMAL HIGH (ref 4.0–10.5)
nRBC: 0 % (ref 0.0–0.2)

## 2023-05-19 LAB — I-STAT CG4 LACTIC ACID, ED: Lactic Acid, Venous: 1.1 mmol/L (ref 0.5–1.9)

## 2023-05-19 LAB — CBG MONITORING, ED: Glucose-Capillary: 308 mg/dL — ABNORMAL HIGH (ref 70–99)

## 2023-05-19 NOTE — ED Triage Notes (Signed)
Patient has injury to right 2nd toe, toe has progressively gotten worse since last week. Toe is white to the 2nd knuckle with black spot on the bottom of the toe. Picture from yesterday shows the toe was only white to 1st knuckle. Patient is diabetic, has not had antibiotics.

## 2023-05-20 ENCOUNTER — Encounter (HOSPITAL_COMMUNITY): Payer: Self-pay | Admitting: Internal Medicine

## 2023-05-20 ENCOUNTER — Emergency Department (HOSPITAL_COMMUNITY): Payer: 59

## 2023-05-20 DIAGNOSIS — B9561 Methicillin susceptible Staphylococcus aureus infection as the cause of diseases classified elsewhere: Secondary | ICD-10-CM | POA: Diagnosis present

## 2023-05-20 DIAGNOSIS — Z83719 Family history of colon polyps, unspecified: Secondary | ICD-10-CM | POA: Diagnosis not present

## 2023-05-20 DIAGNOSIS — Z6836 Body mass index (BMI) 36.0-36.9, adult: Secondary | ICD-10-CM | POA: Diagnosis not present

## 2023-05-20 DIAGNOSIS — F419 Anxiety disorder, unspecified: Secondary | ICD-10-CM | POA: Diagnosis present

## 2023-05-20 DIAGNOSIS — E1152 Type 2 diabetes mellitus with diabetic peripheral angiopathy with gangrene: Secondary | ICD-10-CM | POA: Diagnosis present

## 2023-05-20 DIAGNOSIS — E1140 Type 2 diabetes mellitus with diabetic neuropathy, unspecified: Secondary | ICD-10-CM | POA: Diagnosis present

## 2023-05-20 DIAGNOSIS — L97519 Non-pressure chronic ulcer of other part of right foot with unspecified severity: Secondary | ICD-10-CM | POA: Diagnosis present

## 2023-05-20 DIAGNOSIS — Z8249 Family history of ischemic heart disease and other diseases of the circulatory system: Secondary | ICD-10-CM | POA: Diagnosis not present

## 2023-05-20 DIAGNOSIS — Z825 Family history of asthma and other chronic lower respiratory diseases: Secondary | ICD-10-CM | POA: Diagnosis not present

## 2023-05-20 DIAGNOSIS — E785 Hyperlipidemia, unspecified: Secondary | ICD-10-CM | POA: Diagnosis present

## 2023-05-20 DIAGNOSIS — E11628 Type 2 diabetes mellitus with other skin complications: Secondary | ICD-10-CM | POA: Diagnosis present

## 2023-05-20 DIAGNOSIS — L089 Local infection of the skin and subcutaneous tissue, unspecified: Secondary | ICD-10-CM | POA: Diagnosis not present

## 2023-05-20 DIAGNOSIS — W228XXA Striking against or struck by other objects, initial encounter: Secondary | ICD-10-CM | POA: Diagnosis present

## 2023-05-20 DIAGNOSIS — Z91041 Radiographic dye allergy status: Secondary | ICD-10-CM | POA: Diagnosis not present

## 2023-05-20 DIAGNOSIS — M869 Osteomyelitis, unspecified: Secondary | ICD-10-CM

## 2023-05-20 DIAGNOSIS — E1169 Type 2 diabetes mellitus with other specified complication: Secondary | ICD-10-CM | POA: Diagnosis present

## 2023-05-20 DIAGNOSIS — E1142 Type 2 diabetes mellitus with diabetic polyneuropathy: Secondary | ICD-10-CM

## 2023-05-20 DIAGNOSIS — Z794 Long term (current) use of insulin: Secondary | ICD-10-CM | POA: Diagnosis not present

## 2023-05-20 DIAGNOSIS — Z885 Allergy status to narcotic agent status: Secondary | ICD-10-CM | POA: Diagnosis not present

## 2023-05-20 DIAGNOSIS — E1165 Type 2 diabetes mellitus with hyperglycemia: Secondary | ICD-10-CM | POA: Diagnosis present

## 2023-05-20 DIAGNOSIS — I10 Essential (primary) hypertension: Secondary | ICD-10-CM | POA: Diagnosis present

## 2023-05-20 DIAGNOSIS — E669 Obesity, unspecified: Secondary | ICD-10-CM | POA: Diagnosis present

## 2023-05-20 DIAGNOSIS — Z833 Family history of diabetes mellitus: Secondary | ICD-10-CM | POA: Diagnosis not present

## 2023-05-20 DIAGNOSIS — E119 Type 2 diabetes mellitus without complications: Secondary | ICD-10-CM | POA: Diagnosis not present

## 2023-05-20 DIAGNOSIS — E66812 Obesity, class 2: Secondary | ICD-10-CM | POA: Diagnosis present

## 2023-05-20 DIAGNOSIS — L03031 Cellulitis of right toe: Secondary | ICD-10-CM | POA: Diagnosis present

## 2023-05-20 DIAGNOSIS — F909 Attention-deficit hyperactivity disorder, unspecified type: Secondary | ICD-10-CM | POA: Diagnosis present

## 2023-05-20 DIAGNOSIS — S99921A Unspecified injury of right foot, initial encounter: Secondary | ICD-10-CM | POA: Diagnosis present

## 2023-05-20 DIAGNOSIS — M868X7 Other osteomyelitis, ankle and foot: Secondary | ICD-10-CM | POA: Diagnosis present

## 2023-05-20 LAB — CBC WITH DIFFERENTIAL/PLATELET
Abs Immature Granulocytes: 0.02 10*3/uL (ref 0.00–0.07)
Basophils Absolute: 0.1 10*3/uL (ref 0.0–0.1)
Basophils Relative: 1 %
Eosinophils Absolute: 0.6 10*3/uL — ABNORMAL HIGH (ref 0.0–0.5)
Eosinophils Relative: 7 %
HCT: 39.3 % (ref 39.0–52.0)
Hemoglobin: 13.1 g/dL (ref 13.0–17.0)
Immature Granulocytes: 0 %
Lymphocytes Relative: 28 %
Lymphs Abs: 2.5 10*3/uL (ref 0.7–4.0)
MCH: 30.4 pg (ref 26.0–34.0)
MCHC: 33.3 g/dL (ref 30.0–36.0)
MCV: 91.2 fL (ref 80.0–100.0)
Monocytes Absolute: 0.5 10*3/uL (ref 0.1–1.0)
Monocytes Relative: 6 %
Neutro Abs: 5.2 10*3/uL (ref 1.7–7.7)
Neutrophils Relative %: 58 %
Platelets: 218 10*3/uL (ref 150–400)
RBC: 4.31 MIL/uL (ref 4.22–5.81)
RDW: 13.7 % (ref 11.5–15.5)
WBC: 8.9 10*3/uL (ref 4.0–10.5)
nRBC: 0 % (ref 0.0–0.2)

## 2023-05-20 LAB — GLUCOSE, CAPILLARY
Glucose-Capillary: 414 mg/dL — ABNORMAL HIGH (ref 70–99)
Glucose-Capillary: 376 mg/dL — ABNORMAL HIGH (ref 70–99)

## 2023-05-20 LAB — BASIC METABOLIC PANEL
Anion gap: 7 (ref 5–15)
BUN: 17 mg/dL (ref 8–23)
CO2: 26 mmol/L (ref 22–32)
Calcium: 8.1 mg/dL — ABNORMAL LOW (ref 8.9–10.3)
Chloride: 101 mmol/L (ref 98–111)
Creatinine, Ser: 0.8 mg/dL (ref 0.61–1.24)
GFR, Estimated: >60 mL/min (ref >60)
Glucose, Bld: 348 mg/dL — ABNORMAL HIGH (ref 70–99)
Potassium: 3.3 mmol/L — ABNORMAL LOW (ref 3.5–5.1)
Sodium: 134 mmol/L — ABNORMAL LOW (ref 135–145)

## 2023-05-20 LAB — HEMOGLOBIN A1C
Hgb A1c MFr Bld: 9.6 % — ABNORMAL HIGH (ref 4.8–5.6)
Mean Plasma Glucose: 229 mg/dL

## 2023-05-20 LAB — SEDIMENTATION RATE: Sed Rate: 30 mm/h — ABNORMAL HIGH (ref 0–16)

## 2023-05-20 LAB — CBG MONITORING, ED: Glucose-Capillary: 274 mg/dL — ABNORMAL HIGH (ref 70–99)

## 2023-05-20 LAB — PREALBUMIN: Prealbumin: 13 mg/dL — ABNORMAL LOW (ref 18–38)

## 2023-05-20 LAB — C-REACTIVE PROTEIN: CRP: 2.1 mg/dL — ABNORMAL HIGH (ref <1.0)

## 2023-05-20 LAB — HIV ANTIBODY (ROUTINE TESTING W REFLEX): HIV Screen 4th Generation wRfx: NONREACTIVE

## 2023-05-20 MED ORDER — OXYCODONE HCL 5 MG PO TABS
5.0000 mg | ORAL_TABLET | ORAL | Status: DC | PRN
  Filled 2023-05-20: qty 1

## 2023-05-20 MED ORDER — VANCOMYCIN HCL IN DEXTROSE 1-5 GM/200ML-% IV SOLN
1000.0000 mg | Freq: Once | INTRAVENOUS | Status: AC
  Administered 2023-05-20: 1000 mg via INTRAVENOUS
  Filled 2023-05-20: qty 200

## 2023-05-20 MED ORDER — ONDANSETRON HCL 4 MG/2ML IJ SOLN: 4.0000 mg | Freq: Four times a day (QID) | INTRAMUSCULAR | Status: DC | PRN

## 2023-05-20 MED ORDER — BISACODYL 5 MG PO TBEC: 5.0000 mg | DELAYED_RELEASE_TABLET | Freq: Every day | ORAL | Status: DC | PRN

## 2023-05-20 MED ORDER — HYDRALAZINE HCL 20 MG/ML IJ SOLN
5.0000 mg | INTRAMUSCULAR | Status: DC | PRN
  Administered 2023-05-21: 5 mg via INTRAVENOUS
  Filled 2023-05-20: qty 1

## 2023-05-20 MED ORDER — HYDROCHLOROTHIAZIDE 25 MG PO TABS
25.0000 mg | ORAL_TABLET | Freq: Every day | ORAL | Status: DC
  Administered 2023-05-20 – 2023-05-24 (×5): 25 mg via ORAL
  Filled 2023-05-20 (×5): qty 1

## 2023-05-20 MED ORDER — PANTOPRAZOLE SODIUM 40 MG PO TBEC
40.0000 mg | DELAYED_RELEASE_TABLET | Freq: Every day | ORAL | Status: DC
Start: 2023-05-20 — End: 2023-05-24
  Administered 2023-05-20 – 2023-05-24 (×5): 40 mg via ORAL
  Filled 2023-05-20 (×5): qty 1

## 2023-05-20 MED ORDER — ALPRAZOLAM 0.5 MG PO TABS
0.5000 mg | ORAL_TABLET | Freq: Two times a day (BID) | ORAL | Status: DC | PRN
  Administered 2023-05-20 – 2023-05-22 (×4): 0.5 mg via ORAL
  Filled 2023-05-20 (×4): qty 1

## 2023-05-20 MED ORDER — ACETAMINOPHEN 650 MG RE SUPP: 650.0000 mg | Freq: Four times a day (QID) | RECTAL | Status: DC | PRN

## 2023-05-20 MED ORDER — SODIUM CHLORIDE 0.9 % IV SOLN
2.0000 g | INTRAVENOUS | Status: DC
  Administered 2023-05-21 – 2023-05-24 (×4): 2 g via INTRAVENOUS
  Filled 2023-05-20 (×4): qty 20

## 2023-05-20 MED ORDER — ONDANSETRON HCL 4 MG PO TABS: 4.0000 mg | ORAL_TABLET | Freq: Four times a day (QID) | ORAL | Status: DC | PRN

## 2023-05-20 MED ORDER — BASAGLAR KWIKPEN 100 UNIT/ML ~~LOC~~ SOPN: 30.0000 [IU] | PEN_INJECTOR | Freq: Every day | SUBCUTANEOUS | Status: DC

## 2023-05-20 MED ORDER — ACETAMINOPHEN 325 MG PO TABS
650.0000 mg | ORAL_TABLET | Freq: Four times a day (QID) | ORAL | Status: DC | PRN
  Administered 2023-05-20 – 2023-05-23 (×3): 650 mg via ORAL
  Filled 2023-05-20 (×3): qty 2

## 2023-05-20 MED ORDER — GADOBUTROL 1 MMOL/ML IV SOLN
10.0000 mL | Freq: Once | INTRAVENOUS | Status: AC | PRN
  Administered 2023-05-20: 10 mL via INTRAVENOUS

## 2023-05-20 MED ORDER — INSULIN ASPART 100 UNIT/ML IJ SOLN
10.0000 [IU] | Freq: Once | INTRAMUSCULAR | Status: AC
  Administered 2023-05-22: 10 [IU] via SUBCUTANEOUS

## 2023-05-20 MED ORDER — INSULIN ASPART 100 UNIT/ML IJ SOLN
0.0000 [IU] | Freq: Three times a day (TID) | INTRAMUSCULAR | Status: DC
  Administered 2023-05-20: 10 [IU] via SUBCUTANEOUS
  Administered 2023-05-20: 8 [IU] via SUBCUTANEOUS

## 2023-05-20 MED ORDER — GABAPENTIN 100 MG PO CAPS
100.0000 mg | ORAL_CAPSULE | Freq: Two times a day (BID) | ORAL | Status: DC
  Administered 2023-05-20 – 2023-05-24 (×9): 100 mg via ORAL
  Filled 2023-05-20 (×9): qty 1

## 2023-05-20 MED ORDER — SODIUM CHLORIDE 0.9 % IV SOLN
2.0000 g | Freq: Once | INTRAVENOUS | Status: AC
  Administered 2023-05-20: 2 g via INTRAVENOUS
  Filled 2023-05-20: qty 20

## 2023-05-20 MED ORDER — VANCOMYCIN HCL IN DEXTROSE 1-5 GM/200ML-% IV SOLN
1000.0000 mg | Freq: Two times a day (BID) | INTRAVENOUS | Status: DC
  Administered 2023-05-20 – 2023-05-23 (×7): 1000 mg via INTRAVENOUS
  Filled 2023-05-20 (×7): qty 200

## 2023-05-20 MED ORDER — PRAVASTATIN SODIUM 40 MG PO TABS
20.0000 mg | ORAL_TABLET | Freq: Every day | ORAL | Status: DC
  Administered 2023-05-21 – 2023-05-23 (×3): 20 mg via ORAL
  Filled 2023-05-20 (×4): qty 1

## 2023-05-20 MED ORDER — INSULIN GLARGINE-YFGN 100 UNIT/ML ~~LOC~~ SOLN
30.0000 [IU] | Freq: Every day | SUBCUTANEOUS | Status: DC
  Administered 2023-05-20 – 2023-05-22 (×3): 30 [IU] via SUBCUTANEOUS
  Filled 2023-05-20 (×4): qty 0.3

## 2023-05-20 MED ORDER — POTASSIUM CHLORIDE CRYS ER 20 MEQ PO TBCR
40.0000 meq | EXTENDED_RELEASE_TABLET | Freq: Once | ORAL | Status: AC
  Administered 2023-05-20: 40 meq via ORAL
  Filled 2023-05-20: qty 2

## 2023-05-20 MED ORDER — INSULIN ASPART 100 UNIT/ML IJ SOLN
0.0000 [IU] | Freq: Three times a day (TID) | INTRAMUSCULAR | Status: DC
  Administered 2023-05-21: 7 [IU] via SUBCUTANEOUS
  Administered 2023-05-21: 4 [IU] via SUBCUTANEOUS
  Administered 2023-05-21: 20 [IU] via SUBCUTANEOUS
  Administered 2023-05-22: 7 [IU] via SUBCUTANEOUS
  Administered 2023-05-22: 11 [IU] via SUBCUTANEOUS
  Administered 2023-05-23: 20 [IU] via SUBCUTANEOUS
  Administered 2023-05-23: 7 [IU] via SUBCUTANEOUS
  Administered 2023-05-24: 15 [IU] via SUBCUTANEOUS
  Filled 2023-05-20: qty 1

## 2023-05-20 MED ORDER — RAMIPRIL 5 MG PO CAPS
10.0000 mg | ORAL_CAPSULE | Freq: Every day | ORAL | Status: DC
  Administered 2023-05-20 – 2023-05-24 (×5): 10 mg via ORAL
  Filled 2023-05-20 (×7): qty 2

## 2023-05-20 MED ORDER — INSULIN ASPART 100 UNIT/ML IJ SOLN
0.0000 [IU] | Freq: Every day | INTRAMUSCULAR | Status: DC
  Administered 2023-05-20: 5 [IU] via SUBCUTANEOUS
  Administered 2023-05-23: 4 [IU] via SUBCUTANEOUS
  Administered 2023-05-23: 5 [IU] via SUBCUTANEOUS

## 2023-05-20 MED ORDER — METOPROLOL TARTRATE 50 MG PO TABS
100.0000 mg | ORAL_TABLET | Freq: Two times a day (BID) | ORAL | Status: DC
  Administered 2023-05-20 – 2023-05-24 (×9): 100 mg via ORAL
  Filled 2023-05-20 (×9): qty 2

## 2023-05-20 MED ORDER — INSULIN ASPART 100 UNIT/ML IJ SOLN
10.0000 [IU] | Freq: Once | INTRAMUSCULAR | Status: AC
  Administered 2023-05-20: 10 [IU] via SUBCUTANEOUS

## 2023-05-20 MED ORDER — VANCOMYCIN HCL IN DEXTROSE 1-5 GM/200ML-% IV SOLN
1000.0000 mg | INTRAVENOUS | Status: AC
  Administered 2023-05-20 (×2): 1000 mg via INTRAVENOUS
  Filled 2023-05-20 (×2): qty 200

## 2023-05-20 MED ORDER — SODIUM CHLORIDE 0.9 % IV SOLN: INTRAVENOUS | Status: DC

## 2023-05-20 MED ORDER — METRONIDAZOLE 500 MG/100ML IV SOLN
500.0000 mg | Freq: Three times a day (TID) | INTRAVENOUS | Status: DC
  Administered 2023-05-20 – 2023-05-24 (×11): 500 mg via INTRAVENOUS
  Filled 2023-05-20 (×12): qty 100

## 2023-05-20 MED ORDER — POLYETHYLENE GLYCOL 3350 17 G PO PACK: 17.0000 g | PACK | Freq: Every day | ORAL | Status: DC | PRN

## 2023-05-20 MED ORDER — ENOXAPARIN SODIUM 40 MG/0.4ML IJ SOSY
40.0000 mg | PREFILLED_SYRINGE | INTRAMUSCULAR | Status: DC
  Administered 2023-05-20 – 2023-05-21 (×2): 40 mg via SUBCUTANEOUS
  Filled 2023-05-20 (×2): qty 0.4

## 2023-05-20 NOTE — Plan of Care (Signed)

## 2023-05-20 NOTE — ED Notes (Addendum)
Pt visitor upset with wait, raising voice in lobby at this tech. Visitor believes she is not given priority and is being ignored. Visitor was assured they were still on our list
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
## 2023-05-20 NOTE — Inpatient Diabetes Management (Addendum)
Inpatient Diabetes Program Recommendations  AACE/ADA: New Consensus Statement on Inpatient Glycemic Control (2015)  Target Ranges:  Prepandial:   less than 140 mg/dL      Peak postprandial:   less than 180 mg/dL (1-2 hours)      Critically ill patients:  140 - 180 mg/dL    Latest Reference Range & Units 09/28/22 10:50 02/01/23 09:43  Hemoglobin A1C 4.0 - 5.6 % 8.9 ! 10.5 !  !: Data is abnormal  Latest Reference Range & Units 05/19/23 18:56  Glucose-Capillary 70 - 99 mg/dL 161 (H)  (H): Data is abnormally high   Admit with: Diabetic toe infection   History: DM2  Home DM Meds: Dexcom G6 CGM        Basaglar 30 units at bedtime        Novolin R 16-25 units TID per SSI        Metformin 1000 mg BID        Ozempic 0.5 mg Qweek  Current Orders: Novolog Moderate Correction Scale/ SSI (0-15 units) TID AC + HS      Basaglar 30 units QHS   Current A1c Pending  Novolog SSI to start at 12pm today  Basal Insulin to start tonight    Addendum 11:30am--Met w/ pt down in the ED.  Pt confirmed he sees Dr. Elvera Lennox with [facility] for diabetes management.  Last seen Sept 2024.  Was told to take the following: Lantus 30 units daily Regular insulin 30 units with meals--10 units with snacks Ozempic 0.5 mg Qweek Metformin 1000 mg BID Pt told me he has NOT been taking the Ozempic due to the nausea he gets from it Has NOT been taking the Lantus on regular basis--Maybe takes 1-2 times per week due to forgetting to take it Is taking the Metformin regularly Missed his follow up appt with Dr. Elvera Lennox in October--Needs to re-schedule Has Dexcom G6 CGM and uses on regular basis We talked about the importance of taking the Lantus insulin regularly and the importance of getting his A1c closer to 7%--Last A1c was 10.5%.  Discussed with pt that he has current A1c Pending.  Pt did tell me he switched from the mini cans of regular coke to the mini cans of Coke Zero and Sprite Zero and he told me he has  seen his glucose readings come down with this change.  Asked pt to please be consistent with taking his Lantus and to make follow up appt with ENDO asap. Discussed with pt the Insulin orders placed this AM and reminded pt to restart Dexcom sensor when he goes home.  Pt and family very appreciative of visit.     --Will follow patient during hospitalization--  Ambrose Finland RN, MSN, CDCES Diabetes Coordinator Inpatient Glycemic Control Team Team Pager: [phone] (8a-5p)
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
## 2023-05-20 NOTE — Progress Notes (Signed)
ED Pharmacy Antibiotic Sign Off An antibiotic consult was received from an ED provider for Vancomycin  per pharmacy dosing for toe infection. A chart review was completed to assess appropriateness.   The following one time order(s) were placed:  Vancomycin 2000 mg IV  Further antibiotic and/or antibiotic pharmacy consults should be ordered by the admitting provider if indicated.   Thank you for allowing pharmacy to be a part of this patient's care.   Raymond Pugh, [facility]  Clinical Pharmacist 05/20/23 4:55 AM

## 2023-05-20 NOTE — Progress Notes (Signed)
Pharmacy Antibiotic Note  Raymond Pugh is a 62 y.o. male admitted on 05/19/2023 with cellulitis.  Pharmacy has been consulted for vancomycin dosing.  Plan: Patient received 1000mg  of vancomycin this morning, only 10u/kg, will order another 1000mg  for a total loading dose of 2000mg  of vancomycin. Will then start vancomycin 1000mg  q12h for eAUC: 487, using IBW (BMI: 35), Vd: 0.5, and Scr: 0.8.  Ceftriaxone and flagyl per MD.  Follow culture data for de-escalation.  Monitor renal function for dose adjustments as indicated.   Height: 5\' 7"  (170.2 cm) Weight: 104.3 kg (230 lb) IBW/kg (Calculated) : 66.1  Temp (24hrs), Avg:97.9 F (36.6 C), Min:97.4 F (36.3 C), Max:98.2 F (36.8 C)  Recent Labs  Lab 05/19/23 1445 05/19/23 1456 05/20/23 0806  WBC 10.7*  --  8.9  CREATININE 0.88  --  0.80  LATICACIDVEN  --  1.1  --     Estimated Creatinine Clearance: 110.2 mL/min (by C-G formula based on SCr of 0.8 mg/dL).    Allergies  Allergen Reactions   Hydrocodone Nausea Only   Yellow Dyes (Non-Tartrazine) Hives and Itching    Thank you for allowing pharmacy to be a part of this patient's care.  Estill Batten, PharmD, BCCCP  05/20/2023 10:03 AM

## 2023-05-21 ENCOUNTER — Encounter (HOSPITAL_COMMUNITY): Payer: Self-pay | Admitting: Internal Medicine

## 2023-05-21 ENCOUNTER — Ambulatory Visit: Payer: 59

## 2023-05-21 DIAGNOSIS — L089 Local infection of the skin and subcutaneous tissue, unspecified: Secondary | ICD-10-CM | POA: Diagnosis not present

## 2023-05-21 DIAGNOSIS — E11628 Type 2 diabetes mellitus with other skin complications: Secondary | ICD-10-CM | POA: Diagnosis not present

## 2023-05-21 LAB — BASIC METABOLIC PANEL
Anion gap: 8 (ref 5–15)
BUN: 13 mg/dL (ref 8–23)
CO2: 27 mmol/L (ref 22–32)
Calcium: 8.3 mg/dL — ABNORMAL LOW (ref 8.9–10.3)
Chloride: 103 mmol/L (ref 98–111)
Creatinine, Ser: 0.76 mg/dL (ref 0.61–1.24)
GFR, Estimated: >60 mL/min (ref >60)
Glucose, Bld: 216 mg/dL — ABNORMAL HIGH (ref 70–99)
Potassium: 3.5 mmol/L (ref 3.5–5.1)
Sodium: 138 mmol/L (ref 135–145)

## 2023-05-21 LAB — CBC
HCT: 39.3 % (ref 39.0–52.0)
Hemoglobin: 13 g/dL (ref 13.0–17.0)
MCH: 30.1 pg (ref 26.0–34.0)
MCHC: 33.1 g/dL (ref 30.0–36.0)
MCV: 91 fL (ref 80.0–100.0)
Platelets: 234 10*3/uL (ref 150–400)
RBC: 4.32 MIL/uL (ref 4.22–5.81)
RDW: 13.7 % (ref 11.5–15.5)
WBC: 8.3 10*3/uL (ref 4.0–10.5)
nRBC: 0 % (ref 0.0–0.2)

## 2023-05-21 LAB — GLUCOSE, CAPILLARY
Glucose-Capillary: 164 mg/dL — ABNORMAL HIGH (ref 70–99)
Glucose-Capillary: 185 mg/dL — ABNORMAL HIGH (ref 70–99)
Glucose-Capillary: 219 mg/dL — ABNORMAL HIGH (ref 70–99)
Glucose-Capillary: 233 mg/dL — ABNORMAL HIGH (ref 70–99)
Glucose-Capillary: 293 mg/dL — ABNORMAL HIGH (ref 70–99)
Glucose-Capillary: 368 mg/dL — ABNORMAL HIGH (ref 70–99)

## 2023-05-21 MED ORDER — ENOXAPARIN SODIUM 60 MG/0.6ML IJ SOSY
50.0000 mg | PREFILLED_SYRINGE | INTRAMUSCULAR | Status: DC
  Administered 2023-05-22: 50 mg via SUBCUTANEOUS
  Filled 2023-05-21: qty 0.6

## 2023-05-21 MED ORDER — VITAMIN C 500 MG PO TABS
500.0000 mg | ORAL_TABLET | Freq: Every day | ORAL | Status: DC
  Administered 2023-05-21 – 2023-05-24 (×4): 500 mg via ORAL
  Filled 2023-05-21 (×4): qty 1

## 2023-05-21 MED ORDER — ZINC SULFATE 220 (50 ZN) MG PO CAPS
220.0000 mg | ORAL_CAPSULE | Freq: Every day | ORAL | Status: DC
  Administered 2023-05-21 – 2023-05-24 (×4): 220 mg via ORAL
  Filled 2023-05-21 (×4): qty 1

## 2023-05-21 NOTE — Progress Notes (Signed)
PROGRESS NOTE    Raymond Pugh  SAY:[id number] DOB: 08-04-1960 DOA: 05/19/2023 PCP: Shade Flood, MD    Brief Narrative: 62 y.o. male with medical history significant of HTN, HLD, class 2 obesity, and DM who presented on 12/16 with R 2nd toe toe pain.   He has had a black spot on the back of his toe for a couple of weeks with poor toenail hygiene.  He then stubbed his toe a couple of days ago and the dorsal toe is red and angry-appearing.  No systemic symptoms. ER Course:  Injury of the right second toe which has been progressively getting worse since last week. X-ray of the right foot no acute osseous abnormality.  Focal soft tissue swelling/defect along with the tip of the second toe.  Concern for right toe osteomyelitis and started broad-spectrum antibiotic IV vancomycin and ceftriaxone.     Assessment & Plan:   Principal Problem:   Diabetic foot infection (HCC) Active Problems:   Hyperlipidemia   Attention deficit hyperactivity disorder (ADHD)   Essential hypertension   Uncontrolled type 2 diabetes mellitus with hyperglycemia, with long-term current use of insulin (HCC)   Osteomyelitis of second toe of right foot (HCC)    #1 diabetic toe infection MRI foot noted concerning for osteomyelitis and soft tissue inflammation/infection.  Continue Rocephin and vancomycin and Flagyl.  OR canceled for today rescheduled for Monday.  Holding aspirin. Appreciate podiatry patient is weightbearing as tolerated to right foot  #2 type 2 diabetes continue SSI and insulin long-acting.  Check A1c. CBG (last 3)  Recent Labs    05/21/23 0454 05/21/23 0817 05/21/23 1231  GLUCAP 219* 233* 185*   #3 hypertension on ramipril metoprolol hydrochlorothiazide  #4 hyperlipidemia on statin  #5 anxiety ADHD on Xanax at home continue   Nutrition Problem: Increased nutrient needs Etiology: wound healing, post-op healing  Signs/Symptoms: estimated needs Interventions: MVI, Juven, Education,  Prostat  Estimated body mass index is 36.02 kg/m as calculated from the following:   Height as of this encounter: 5\' 7"  (1.702 m).   Weight as of this encounter: 104.3 kg.  DVT prophylaxis: LOVENOX Code Status FULL Family Communication: dw wife Disposition Plan:  Status is: Inpatient Remains inpatient appropriate because: osteomyelitis   Consultants: podiatry  Procedures: none Antimicrobials: Anti-infectives (From admission, onward)    Start     Dose/Rate Route Frequency Ordered Stop   05/21/23 0800  cefTRIAXone (ROCEPHIN) 2 g in sodium chloride 0.9 % 100 mL IVPB        2 g 200 mL/hr over 30 Minutes Intravenous Every 24 hours 05/20/23 0952 05/28/23 0759   05/20/23 2200  vancomycin (VANCOCIN) IVPB 1000 mg/200 mL premix        1,000 mg 200 mL/hr over 60 Minutes Intravenous Every 12 hours 05/20/23 1009     05/20/23 1015  vancomycin (VANCOCIN) IVPB 1000 mg/200 mL premix        1,000 mg 200 mL/hr over 60 Minutes Intravenous  Once 05/20/23 1009 05/20/23 1542   05/20/23 1000  metroNIDAZOLE (FLAGYL) IVPB 500 mg        500 mg 100 mL/hr over 60 Minutes Intravenous Every 8 hours 05/20/23 0952 05/27/23 0959   05/20/23 0530  vancomycin (VANCOCIN) IVPB 1000 mg/200 mL premix        1,000 mg 200 mL/hr over 60 Minutes Intravenous Every 1 hr x 2 05/20/23 0453 05/20/23 1155   05/20/23 0500  cefTRIAXone (ROCEPHIN) 2 g in sodium chloride 0.9 % 100 mL  IVPB        2 g 200 mL/hr over 30 Minutes Intravenous  Once 05/20/23 0445 05/20/23 0603        Subjective: Irritable anxious   Objective: Vitals:   05/20/23 2350 05/21/23 0456 05/21/23 0813 05/21/23 1055  BP: (!) 155/78 (!) 157/100 (!) 144/90 (!) 146/94  Pulse: 72 69 74 73  Resp: 18 18 18 20   Temp: (!) 97.5 F (36.4 C) 98.2 F (36.8 C) 97.9 F (36.6 C) 97.6 F (36.4 C)  TempSrc: Oral  Oral Oral  SpO2: 99% 99% 98% 97%  Weight:      Height:        Intake/Output Summary (Last 24 hours) at 05/21/2023 1246 Last data filed at  05/20/2023 1739 Gross per 24 hour  Intake 798.16 ml  Output --  Net 798.16 ml   Filed Weights   05/19/23 1442  Weight: 104.3 kg    Examination:  General exam: Appears irritable Respiratory system: Clear to auscultation. Respiratory effort normal. Cardiovascular system: S1 & S2 heard, RRR. No JVD, murmurs, rubs, gallops or clicks. No pedal edema. Gastrointestinal system: Abdomen is nondistended, soft and nontender. No organomegaly or masses felt. Normal bowel sounds heard. Central nervous system: Alert and oriented. No focal neurological deficits. Extremities: foot covered with dressing  Data Reviewed: I have personally reviewed following labs and imaging studies  CBC: Recent Labs  Lab 05/19/23 1445 05/20/23 0806 05/21/23 0520  WBC 10.7* 8.9 8.3  NEUTROABS 6.6 5.2  --   HGB 13.0 13.1 13.0  HCT 40.2 39.3 39.3  MCV 92.6 91.2 91.0  PLT 263 218 234   Basic Metabolic Panel: Recent Labs  Lab 05/19/23 1445 05/20/23 0806 05/21/23 0520  NA 137 134* 138  K 3.3* 3.3* 3.5  CL 103 101 103  CO2 26 26 27   GLUCOSE 151* 348* 216*  BUN 16 17 13   CREATININE 0.88 0.80 0.76  CALCIUM 8.9 8.1* 8.3*   GFR: Estimated Creatinine Clearance: 110.2 mL/min (by C-G formula based on SCr of 0.76 mg/dL). Liver Function Tests: Recent Labs  Lab 05/19/23 1445  AST 27  ALT 27  ALKPHOS 82  BILITOT 0.5  PROT 6.4*  ALBUMIN 3.0*   No results for input(s): "LIPASE", "AMYLASE" in the last 168 hours. No results for input(s): "AMMONIA" in the last 168 hours. Coagulation Profile: No results for input(s): "INR", "PROTIME" in the last 168 hours. Cardiac Enzymes: No results for input(s): "CKTOTAL", "CKMB", "CKMBINDEX", "TROPONINI" in the last 168 hours. BNP (last 3 results) No results for input(s): "PROBNP" in the last 8760 hours. HbA1C: Recent Labs    05/20/23 0505  HGBA1C 9.6*   CBG: Recent Labs  Lab 05/20/23 2006 05/20/23 2348 05/21/23 0454 05/21/23 0817 05/21/23 1231  GLUCAP  376* 293* 219* 233* 185*   Lipid Profile: No results for input(s): "CHOL", "HDL", "LDLCALC", "TRIG", "CHOLHDL", "LDLDIRECT" in the last 72 hours. Thyroid Function Tests: No results for input(s): "TSH", "T4TOTAL", "FREET4", "T3FREE", "THYROIDAB" in the last 72 hours. Anemia Panel: No results for input(s): "VITAMINB12", "FOLATE", "FERRITIN", "TIBC", "IRON", "RETICCTPCT" in the last 72 hours. Sepsis Labs: Recent Labs  Lab 05/19/23 1456  LATICACIDVEN 1.1    No results found for this or any previous visit (from the past 240 hours).       Radiology Studies: MR TOES RIGHT W WO CONTRAST Result Date: 05/20/2023 CLINICAL DATA:  Foot swelling. Diabetes. Osteomyelitis suspected. Stubbed toe a few days ago at home. Worsening right second toe pain, redness, and swelling.  Starting to blister. EXAM: MRI OF THE RIGHT TOES WITHOUT AND WITH CONTRAST TECHNIQUE: Multiplanar, multisequence MR imaging of the right forefoot was performed both before and after administration of intravenous contrast. CONTRAST:  10mL GADAVIST GADOBUTROL 1 MMOL/ML IV SOLN COMPARISON:  Right foot radiographs 05/19/2023 and 06/22/2021 FINDINGS: Bones/Joint/Cartilage In the region of the skin ulcer at the distal tip of second toe described below, there is suspicion for minimal 1-2 mm cortical step-off of a tiny 3 x 6 mm (long axis of the foot by short axis of the foot) tiny fracture fragment. Given the history of trauma to this region a few days ago, this is consistent with an acute to subacute nondisplaced tiny fracture of the distal tip of the distal phalanx of the second toe. There is diffuse marrow edema throughout the second toe distal phalanx. There is moderate patchy marrow edema within the tarsometatarsal joints, greatest within the second tarsometatarsal joint. Mild-to-moderate degenerative cystic changes on both sides of the third tarsometatarsal joint. Cartilage thinning is greatest at the second through fourth tarsometatarsal  joints. Ligaments The Lisfranc ligament complex is intact. The metatarsophalangeal and interphalangeal collateral ligaments appear intact. Muscles and Tendons There is moderate nonspecific edema seen throughout the plantar greater than dorsal midfoot to forefoot musculature. No tendon tear is seen. Soft tissues There is a skin defect and thinning of the subcutaneous fat at the distal tip of second toe (sagittal series 8, image 17). There is intermediate STIR signal within the remaining subcutaneous fat/soft tissues measuring up to approximately 4 mm in thickness contacting the distal aspect of the distal phalanx. There is mild-to-moderate subcutaneous fat edema and swelling within the dorsal midfoot to forefoot. There is a small 3 mm ganglion at the plantar medial aspect of the proximal portion of the proximal phalanx of the fifth toe (axial series 6, image 27 and coronal series 7, image 7). IMPRESSION: 1. There is a skin defect and thinning of the subcutaneous fat at the distal tip of the second toe. This may represent posttraumatic soft tissue injury versus small amount of infectious/inflammatory phlegmon. At the same time, reportedly the patient has had trauma in this region. Recommend clinical correlation for the level of concern for soft tissue infection versus just posttraumatic injury. 2. There is cortical step-off at the dorsal and plantar aspects of the distal portion of the distal phalanx of the second toe (sagittal series 8, image 16). Diffuse marrow edema throughout the second toe distal phalanx. Given the history of trauma to this region a few days ago, this is consistent with an acute to subacute nondisplaced tiny fracture of the distal tip of the distal phalanx of the second toe. Again, clinical correlation is needed to assess for the level of concern of infection versus just posttraumatic injury in the second toe. The cortical step-off suggests there is a posttraumatic fracture within the distal  aspect of the distal phalanx, and the distal phalanx marrow edema may all be due to this acute trauma. At the same time, if there are signs of cellulitis and soft tissue infection adjacent to the distal phalanx of the second toe, the marrow edema within the second toe distal phalanx could be secondary to superimposed osteomyelitis. 3. There is moderate nonspecific edema seen throughout the plantar greater than dorsal midfoot to forefoot musculature. This may be secondary to diabetic myopathy. 4. There is mild-to-moderate subcutaneous fat edema and swelling within the dorsal midfoot to forefoot. Electronically Signed   By: Kerin Salen.D.  On: 05/20/2023 08:53   DG Foot Complete Right Result Date: 05/19/2023 CLINICAL DATA:  Toe pain.  Injury. EXAM: RIGHT FOOT COMPLETE - 3+ VIEW COMPARISON:  06/22/2021. FINDINGS: No acute fracture or dislocation. No aggressive osseous lesion. Mild diffuse degenerative changes of imaged joints. Calcaneal spur noted along the Achilles tendon and Plantar aponeurosis attachment sites. There is focal soft tissue swelling/probable soft tissue defect along the tip of second toe. However, underlying bone is intact. No bone erosions. No radiopaque foreign bodies. IMPRESSION: *No acute osseous abnormality of the right foot. *Focal soft tissue swelling/defect along the tip of second toe. Electronically Signed   By: Jules Schick M.D.   On: 05/19/2023 16:04    Scheduled Meds:  ascorbic acid  500 mg Oral Daily   [START ON 05/22/2023] enoxaparin (LOVENOX) injection  50 mg Subcutaneous Q24H   gabapentin  100 mg Oral BID   hydrochlorothiazide  25 mg Oral Daily   insulin aspart  0-20 Units Subcutaneous TID WC   insulin aspart  0-5 Units Subcutaneous QHS   insulin aspart  10 Units Subcutaneous Once   insulin glargine-yfgn  30 Units Subcutaneous Daily   metoprolol tartrate  100 mg Oral BID   pantoprazole  40 mg Oral Daily   pravastatin  20 mg Oral q1800   ramipril  10 mg Oral  Daily   zinc sulfate (50mg  elemental zinc)  220 mg Oral Daily   Continuous Infusions:  sodium chloride 75 mL/hr at 05/20/23 2348   cefTRIAXone (ROCEPHIN)  IV 2 g (05/21/23 1013)   metronidazole 500 mg (05/21/23 1233)   vancomycin 1,000 mg (05/20/23 2131)     LOS: 1 day    Time spent: 41 MIN Alwyn Ren, MD  05/21/2023, 12:46 PM

## 2023-05-21 NOTE — Progress Notes (Signed)
PODIATRY PROGRESS NOTE Patient Name: Raymond Pugh  DOB [birth year] DOA 05/19/2023  Hospital Day: 3  Assessment:  62 y.o. 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 A1c and CRP in process WBC  8.3  Plan:  - OR cancelled today due to emergency case necessitating priority. Will reassess the toe tomorrow. Discussed with family if the toe is significantly improved we may be able to monitor on abx as an outpatient. If OR for amp would occur Monday AM  - Continue IV abx broad spectrum pending further culture data - Anticoagulation: ok to continue per primary - Wound care: None needed preoperatively - WB status: Weightbearing as tolerated of the right foot - Will continue to follow        Corinna Gab, DPM [facility]    Subjective:  Discussed with patient I spoke with family member. OR cancelled due to emergency in OR this AM. Will tentatively add Raymond Pugh for Monday.   Objective:   Vitals:   05/21/23 0813 05/21/23 1055  BP: (!) 144/90 (!) 146/94  Pulse: 74 73  Resp: 18 20  Temp: 97.9 F (36.6 C) 97.6 F (36.4 C)  SpO2: 98% 97%       Latest Ref Rng & Units 05/21/2023    5:20 AM 05/20/2023    8:06 AM 05/19/2023    2:45 PM  CBC  WBC 4.0 - 10.5 K/uL 8.3  8.9  10.7   Hemoglobin 13.0 - 17.0 g/dL 13.2  44.0  10.2   Hematocrit 39.0 - 52.0 % 39.3  39.3  40.2   Platelets 150 - 400 K/uL 234  218  263        Latest Ref Rng & Units 05/21/2023    5:20 AM 05/20/2023    8:06 AM 05/19/2023    2:45 PM  BMP  Glucose 70 - 99 mg/dL 725  366  440   BUN 8 - 23 mg/dL 13  17  16    Creatinine 0.61 - 1.24 mg/dL 3.47  4.25  9.56   Sodium 135 - 145 mmol/L 138  134  137   Potassium 3.5 - 5.1 mmol/L 3.5   3.3  3.3   Chloride 98 - 111 mmol/L 103  101  103   CO2 22 - 32 mmol/L 27  26  26    Calcium 8.9 - 10.3 mg/dL 8.3  8.1  8.9     General: AAOx3, NAD  Lower Extremity Exam Lower Extremity Exam Vasc:     R - PT palpable, DP palpable. Cap refill absent to the right second toe               L - PT palpable, DP palpable. Cap refill <3 sec to digits   Derm:    R -distal ulceration with necrotic changes at the distal tuft of the right second toe.  No active drainage however there is edema and erythema of the second toe.               L - Normal temp/texture/turgor with no open lesion or clinical signs of infection    MSK:     R -edema noted to the right forefoot  L -  No gross deformities. Compartments soft, non-tender, compressible   Neuro:R - Gross sensation diminished. Gross motor function intact                 L - Gross sensation diminished. Gross motor function intact     Radiology:  Results reviewed. See assessment for pertinent imaging results

## 2023-05-21 NOTE — Discharge Instructions (Signed)
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
## 2023-05-21 NOTE — Progress Notes (Addendum)
Initial Nutrition Assessment  DOCUMENTATION CODES:   Obesity unspecified  INTERVENTION:  Once diet resumes, recommend: Carb modified diet Vitamin C 500mg  daily Zinc 220mg  daily x14 days  "Plate Method" and "Carbohydrate Counting for People with Diabetes" handouts added to AVS for reference once discharged.  NUTRITION DIAGNOSIS:   Increased nutrient needs related to wound healing, post-op healing as evidenced by estimated needs.  GOAL:   Patient will meet greater than or equal to 90% of their needs  MONITOR:   PO intake, Supplement acceptance, Labs, Skin  REASON FOR ASSESSMENT:   Consult Wound healing  ASSESSMENT:   Pt admitted from home with c/o R second toe pain d/t ulceration and osteomyelitis. PMH significant for HTN, HLD, obesity and DM.  Pt NPO and off unit for R second toe amputation. Unable to obtain detailed nutrition related history at this time.   Per Podiatry note, pt's wife reports that pt is a poorly controlled diabetic.   Will place order for nutrition supplements to support wound/post-op healing and adjust as appropriate on follow up.   Question whether admission weight is actual versus stated.  Per review of weight documentation PTA, pt's weight appears to have been increasing over the last 6 months.   Medications: SSI 0-20 units TID, SSI 0-5 units at bedtime, SSI 10 units daily, semglee 30 units daily, protonix, IV abx x3  Labs reviewed  CBG's 219-376  HgbA1c 9.6%  NUTRITION - FOCUSED PHYSICAL EXAM: RD working remotely. Deferred to follow up.   Diet Order:   Diet Order             Diet NPO time specified Except for: Sips with Meds  Diet effective midnight                   EDUCATION NEEDS:   Not appropriate for education at this time  Skin:  Skin Assessment: Skin Integrity Issues: Skin Integrity Issues:: Other (Comment) Other: non-pressure wound R 2nd toe  Last BM:  12/27  Height:   Ht Readings from Last 1 Encounters:   05/19/23 5\' 7"  (1.702 m)    Weight:   Wt Readings from Last 1 Encounters:  05/19/23 104.3 kg    Ideal Body Weight:  67.3 kg  BMI:  Body mass index is 36.02 kg/m.  Estimated Nutritional Needs:   Kcal:  1700-1900  Protein:  90-105g  Fluid:  >/=1.7L  Drusilla Kanner, RDN, LDN Clinical Nutrition

## 2023-05-22 DIAGNOSIS — L089 Local infection of the skin and subcutaneous tissue, unspecified: Secondary | ICD-10-CM | POA: Diagnosis not present

## 2023-05-22 DIAGNOSIS — E11628 Type 2 diabetes mellitus with other skin complications: Secondary | ICD-10-CM | POA: Diagnosis not present

## 2023-05-22 LAB — BASIC METABOLIC PANEL
Anion gap: 13 (ref 5–15)
BUN: 13 mg/dL (ref 8–23)
CO2: 22 mmol/L (ref 22–32)
Calcium: 8.6 mg/dL — ABNORMAL LOW (ref 8.9–10.3)
Chloride: 98 mmol/L (ref 98–111)
Creatinine, Ser: 0.92 mg/dL (ref 0.61–1.24)
GFR, Estimated: >60 mL/min (ref >60)
Glucose, Bld: 488 mg/dL — ABNORMAL HIGH (ref 70–99)
Potassium: 3.7 mmol/L (ref 3.5–5.1)
Sodium: 133 mmol/L — ABNORMAL LOW (ref 135–145)

## 2023-05-22 LAB — CBC
HCT: 41.1 % (ref 39.0–52.0)
Hemoglobin: 13.9 g/dL (ref 13.0–17.0)
MCH: 30.3 pg (ref 26.0–34.0)
MCHC: 33.8 g/dL (ref 30.0–36.0)
MCV: 89.5 fL (ref 80.0–100.0)
Platelets: 253 10*3/uL (ref 150–400)
RBC: 4.59 MIL/uL (ref 4.22–5.81)
RDW: 13.8 % (ref 11.5–15.5)
WBC: 10.5 10*3/uL (ref 4.0–10.5)
nRBC: 0 % (ref 0.0–0.2)

## 2023-05-22 LAB — COMPREHENSIVE METABOLIC PANEL
ALT: 19 U/L (ref 0–44)
AST: 24 U/L (ref 15–41)
Albumin: 2.8 g/dL — ABNORMAL LOW (ref 3.5–5.0)
Alkaline Phosphatase: 73 U/L (ref 38–126)
Anion gap: 9 (ref 5–15)
BUN: 12 mg/dL (ref 8–23)
CO2: 25 mmol/L (ref 22–32)
Calcium: 8.4 mg/dL — ABNORMAL LOW (ref 8.9–10.3)
Chloride: 99 mmol/L (ref 98–111)
Creatinine, Ser: 0.8 mg/dL (ref 0.61–1.24)
GFR, Estimated: >60 mL/min (ref >60)
Glucose, Bld: 193 mg/dL — ABNORMAL HIGH (ref 70–99)
Potassium: 3.8 mmol/L (ref 3.5–5.1)
Sodium: 133 mmol/L — ABNORMAL LOW (ref 135–145)
Total Bilirubin: 0.7 mg/dL (ref <1.2)
Total Protein: 6.5 g/dL (ref 6.5–8.1)

## 2023-05-22 LAB — GLUCOSE, CAPILLARY
Glucose-Capillary: 200 mg/dL — ABNORMAL HIGH (ref 70–99)
Glucose-Capillary: 248 mg/dL — ABNORMAL HIGH (ref 70–99)
Glucose-Capillary: 289 mg/dL — ABNORMAL HIGH (ref 70–99)
Glucose-Capillary: 309 mg/dL — ABNORMAL HIGH (ref 70–99)
Glucose-Capillary: 444 mg/dL — ABNORMAL HIGH (ref 70–99)
Glucose-Capillary: 527 mg/dL (ref 70–99)

## 2023-05-22 MED ORDER — INSULIN ASPART 100 UNIT/ML IJ SOLN
10.0000 [IU] | Freq: Once | INTRAMUSCULAR | Status: AC
  Administered 2023-05-22: 10 [IU] via SUBCUTANEOUS

## 2023-05-22 NOTE — Anesthesia Preprocedure Evaluation (Signed)
Anesthesia Evaluation  Patient identified by MRN, date of birth, ID band Patient awake    Reviewed: Allergy & Precautions, NPO status , Patient's Chart, lab work & pertinent test results  Airway Mallampati: II  TM Distance: >3 FB Neck ROM: Full    Dental no notable dental hx. (+) Missing, Poor Dentition,    Pulmonary neg pulmonary ROS   Pulmonary exam normal breath sounds clear to auscultation       Cardiovascular hypertension, Normal cardiovascular exam Rhythm:Regular Rate:Normal     Neuro/Psych  Neuromuscular disease    GI/Hepatic hiatal hernia,GERD  Controlled and Medicated,,  Endo/Other  diabetes    Renal/GU      Musculoskeletal   Abdominal  (+) + obese (BMI 36)  Peds  Hematology   Anesthesia Other Findings All: Hydrocodone  Reproductive/Obstetrics                             Anesthesia Physical Anesthesia Plan  ASA: 3  Anesthesia Plan: Regional and MAC   Post-op Pain Management: Ofirmev IV (intra-op)*   Induction: Intravenous  PONV Risk Score and Plan: 2 and Treatment may vary due to age or medical condition, Midazolam and Propofol infusion  Airway Management Planned: Simple Face Mask, Natural Airway and Nasal Cannula  Additional Equipment: None  Intra-op Plan:   Post-operative Plan:   Informed Consent: I have reviewed the patients History and Physical, chart, labs and discussed the procedure including the risks, benefits and alternatives for the proposed anesthesia with the patient or authorized representative who has indicated his/her understanding and acceptance.     Dental advisory given  Plan Discussed with: CRNA and Anesthesiologist  Anesthesia Plan Comments: (MAC)        Anesthesia Quick Evaluation

## 2023-05-22 NOTE — Progress Notes (Signed)
PODIATRY PROGRESS NOTE Patient Name: Raymond Pugh  DOB 05/18/1961 DOA 05/19/2023  Hospital Day: 4  Assessment:  62 y.o. 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 A1c and CRP in process WBC on admission 10.7  Plan:  -N.p.o. past midnight for OR tomorrow AM for Right 2nd toe amputation. Discussed with patient and family bedside and he is agreeable.  - Continue IV abx broad spectrum pending further culture data - Anticoagulation: Hold pending OR - Wound care: None needed preoperatively - WB status: Weightbearing as tolerated of the right foot in postop shoe will obtain postop - Will continue to follow        Corinna Gab, DPM [facility]    Subjective:  Discussed treatment options including wound care and IV abx long term though I feel this has a poor chance of being successful. His toe is clinically not improved and shows signs of deep infection despite abx so far this admission. Recommend amputation and he agrees to proceed.   Objective:   Vitals:   05/22/23 0824 05/22/23 0826  BP: (!) 147/84 (!) 147/84  Pulse: 75 75  Resp:  18  Temp:  98 F (36.7 C)  SpO2:  97%       Latest Ref Rng & Units 05/21/2023    5:20 AM 05/20/2023    8:06 AM 05/19/2023    2:45 PM  CBC  WBC 4.0 - 10.5 K/uL 8.3  8.9  10.7   Hemoglobin 13.0 - 17.0 g/dL 32.3  55.7  32.2   Hematocrit 39.0 - 52.0 % 39.3  39.3  40.2   Platelets 150 - 400 K/uL 234  218  263        Latest Ref Rng & Units 05/21/2023    5:20 AM 05/20/2023    8:06 AM 05/19/2023    2:45 PM  BMP  Glucose 70 - 99 mg/dL 025  427  062   BUN 8 - 23 mg/dL 13  17  16    Creatinine 0.61 - 1.24 mg/dL 3.76  2.83  1.51   Sodium 135 - 145 mmol/L 138   134  137   Potassium 3.5 - 5.1 mmol/L 3.5  3.3  3.3   Chloride 98 - 111 mmol/L 103  101  103   CO2 22 - 32 mmol/L 27  26  26    Calcium 8.9 - 10.3 mg/dL 8.3  8.1  8.9     General: AAOx3, NAD  Lower Extremity Exam Vasc:     R - PT palpable, DP palpable. Cap refill absent to the right second toe               L - PT palpable, DP palpable. Cap refill <3 sec to digits   Derm:    R -distal ulceration with necrotic changes at the distal tuft of the right second toe.  No active drainage however there is edema and erythema of the second toe.               L - Normal temp/texture/turgor with no open lesion or clinical signs of infection    MSK:     R -edema noted to the right forefoot  L -  No gross deformities. Compartments soft, non-tender, compressible   Neuro:R - Gross sensation diminished. Gross motor function intact                 L - Gross sensation diminished. Gross motor function intact     Radiology:  Results reviewed. See assessment for pertinent imaging results

## 2023-05-22 NOTE — Progress Notes (Signed)
PROGRESS NOTE    Raymond Pugh  UEA:[id number] DOB: [birth year] DOA: 05/19/2023 PCP: Raymond Flood, MD    Brief Narrative: 62 y.o. male with medical history significant of HTN, HLD, class 2 obesity, and DM who presented on 12/16 with R 2nd toe toe pain.   He has had a black spot on the back of his toe for a couple of weeks with poor toenail hygiene.  He then stubbed his toe a couple of days ago and the dorsal toe is red and angry-appearing.  No systemic symptoms. ER Course:  Injury of the right second toe which has been progressively getting worse since last week. X-ray of the right foot no acute osseous abnormality.  Focal soft tissue swelling/defect along with the tip of the second toe.  Concern for right toe osteomyelitis and started broad-spectrum antibiotic IV vancomycin and ceftriaxone.     Assessment & Plan:   Principal Problem:   Diabetic foot infection (HCC) Active Problems:   Hyperlipidemia   Attention deficit hyperactivity disorder (ADHD)   Essential hypertension   Uncontrolled type 2 diabetes mellitus with hyperglycemia, with long-term current use of insulin (HCC)   Osteomyelitis of second toe of right foot (HCC)    #1 diabetic toe infection MRI foot noted concerning for osteomyelitis and soft tissue inflammation/infection.  Continue Rocephin and vancomycin and Flagyl.  4 OR tomorrow.  Holding aspirin.  Appreciate podiatry patient is weightbearing as tolerated to right foot  #2 type 2 diabetes continue SSI and insulin long-acting.  Check A1c. CBG (last 3)  Recent Labs    05/21/23 1721 05/21/23 2043 05/22/23 0821  GLUCAP 368* 164* 248*   #3 hypertension on ramipril metoprolol hydrochlorothiazide  #4 hyperlipidemia on statin  #5 anxiety ADHD on Xanax at home continue   Nutrition Problem: Increased nutrient needs Etiology: wound healing, post-op healing  Signs/Symptoms: estimated needs Interventions: MVI, Juven, Education, Prostat  Estimated body mass  index is 36.02 kg/m as calculated from the following:   Height as of this encounter: 5\' 7"  (1.702 m).   Weight as of this encounter: 104.3 kg.  DVT prophylaxis: LOVENOX Code Status FULL Family Communication: dw wife Disposition Plan:  Status is: Inpatient Remains inpatient appropriate because: osteomyelitis   Consultants: podiatry  Procedures: none Antimicrobials: Anti-infectives (From admission, onward)    Start     Dose/Rate Route Frequency Ordered Stop   05/21/23 0800  cefTRIAXone (ROCEPHIN) 2 g in sodium chloride 0.9 % 100 mL IVPB        2 g 200 mL/hr over 30 Minutes Intravenous Every 24 hours 05/20/23 0952 05/28/23 0759   05/20/23 2200  vancomycin (VANCOCIN) IVPB 1000 mg/200 mL premix        1,000 mg 200 mL/hr over 60 Minutes Intravenous Every 12 hours 05/20/23 1009     05/20/23 1015  vancomycin (VANCOCIN) IVPB 1000 mg/200 mL premix        1,000 mg 200 mL/hr over 60 Minutes Intravenous  Once 05/20/23 1009 05/20/23 1542   05/20/23 1000  metroNIDAZOLE (FLAGYL) IVPB 500 mg        500 mg 100 mL/hr over 60 Minutes Intravenous Every 8 hours 05/20/23 0952 05/27/23 0959   05/20/23 0530  vancomycin (VANCOCIN) IVPB 1000 mg/200 mL premix        1,000 mg 200 mL/hr over 60 Minutes Intravenous Every 1 hr x 2 05/20/23 0453 05/20/23 1155   05/20/23 0500  cefTRIAXone (ROCEPHIN) 2 g in sodium chloride 0.9 % 100 mL IVPB  2 g 200 mL/hr over 30 Minutes Intravenous  Once 05/20/23 0445 05/20/23 0603        Subjective: He is sitting up in bed eating breakfast wife at bedside they had a lot of questions which were all answered to the best of my knowledge and referred them to speak with podiatry.  She had podiatry.  Plan is for the OR tomorrow.  Objective: Vitals:   05/21/23 2002 05/22/23 0423 05/22/23 0824 05/22/23 0826  BP: 138/88 137/70 (!) 147/84 (!) 147/84  Pulse: 76 71 75 75  Resp: 18 18  18   Temp: 98 F (36.7 C) 98.5 F (36.9 C)  98 F (36.7 C)  TempSrc: Oral     SpO2:  99% 94%  97%  Weight:      Height:       No intake or output data in the 24 hours ending 05/22/23 1148  Filed Weights   05/19/23 1442  Weight: 104.3 kg    Examination:  General exam: Appears irritable Respiratory system: Clear to auscultation. Respiratory effort normal. Cardiovascular system: S1 & S2 heard, RRR. No JVD, murmurs, rubs, gallops or clicks. No pedal edema. Gastrointestinal system: Abdomen is nondistended, soft and nontender. No organomegaly or masses felt. Normal bowel sounds heard. Central nervous system: Alert and oriented. No focal neurological deficits. Extremities: foot covered with dressing  Data Reviewed: I have personally reviewed following labs and imaging studies  CBC: Recent Labs  Lab 05/19/23 1445 05/20/23 0806 05/21/23 0520  WBC 10.7* 8.9 8.3  NEUTROABS 6.6 5.2  --   HGB 13.0 13.1 13.0  HCT 40.2 39.3 39.3  MCV 92.6 91.2 91.0  PLT 263 218 234   Basic Metabolic Panel: Recent Labs  Lab 05/19/23 1445 05/20/23 0806 05/21/23 0520  NA 137 134* 138  K 3.3* 3.3* 3.5  CL 103 101 103  CO2 26 26 27   GLUCOSE 151* 348* 216*  BUN 16 17 13   CREATININE 0.88 0.80 0.76  CALCIUM 8.9 8.1* 8.3*   GFR: Estimated Creatinine Clearance: 110.2 mL/min (by C-G formula based on SCr of 0.76 mg/dL). Liver Function Tests: Recent Labs  Lab 05/19/23 1445  AST 27  ALT 27  ALKPHOS 82  BILITOT 0.5  PROT 6.4*  ALBUMIN 3.0*   No results for input(s): "LIPASE", "AMYLASE" in the last 168 hours. No results for input(s): "AMMONIA" in the last 168 hours. Coagulation Profile: No results for input(s): "INR", "PROTIME" in the last 168 hours. Cardiac Enzymes: No results for input(s): "CKTOTAL", "CKMB", "CKMBINDEX", "TROPONINI" in the last 168 hours. BNP (last 3 results) No results for input(s): "PROBNP" in the last 8760 hours. HbA1C: Recent Labs    05/20/23 0505  HGBA1C 9.6*   CBG: Recent Labs  Lab 05/21/23 0817 05/21/23 1231 05/21/23 1721 05/21/23 2043  05/22/23 0821  GLUCAP 233* 185* 368* 164* 248*   Lipid Profile: No results for input(s): "CHOL", "HDL", "LDLCALC", "TRIG", "CHOLHDL", "LDLDIRECT" in the last 72 hours. Thyroid Function Tests: No results for input(s): "TSH", "T4TOTAL", "FREET4", "T3FREE", "THYROIDAB" in the last 72 hours. Anemia Panel: No results for input(s): "VITAMINB12", "FOLATE", "FERRITIN", "TIBC", "IRON", "RETICCTPCT" in the last 72 hours. Sepsis Labs: Recent Labs  Lab 05/19/23 1456  LATICACIDVEN 1.1    No results found for this or any previous visit (from the past 240 hours).       Radiology Studies: No results found.   Scheduled Meds:  ascorbic acid  500 mg Oral Daily   gabapentin  100 mg Oral BID  hydrochlorothiazide  25 mg Oral Daily   insulin aspart  0-20 Units Subcutaneous TID WC   insulin aspart  0-5 Units Subcutaneous QHS   insulin aspart  10 Units Subcutaneous Once   insulin glargine-yfgn  30 Units Subcutaneous Daily   metoprolol tartrate  100 mg Oral BID   pantoprazole  40 mg Oral Daily   pravastatin  20 mg Oral q1800   ramipril  10 mg Oral Daily   zinc sulfate (50mg  elemental zinc)  220 mg Oral Daily   Continuous Infusions:  cefTRIAXone (ROCEPHIN)  IV 2 g (05/22/23 0836)   metronidazole 500 mg (05/22/23 1136)   vancomycin 1,000 mg (05/22/23 0007)     LOS: 2 days    Time spent: 51 MIN Alwyn Ren, MD  05/22/2023, 11:48 AM

## 2023-05-23 ENCOUNTER — Other Ambulatory Visit: Payer: Self-pay

## 2023-05-23 ENCOUNTER — Inpatient Hospital Stay (HOSPITAL_COMMUNITY): Payer: 59

## 2023-05-23 ENCOUNTER — Encounter (HOSPITAL_COMMUNITY)
Admission: EM | Disposition: A | Discharge status: Home / Self Care | Payer: Self-pay | Source: Home / Self Care | Attending: Internal Medicine

## 2023-05-23 ENCOUNTER — Inpatient Hospital Stay (HOSPITAL_COMMUNITY): Payer: 59 | Admitting: Certified Registered Nurse Anesthetist

## 2023-05-23 ENCOUNTER — Encounter (HOSPITAL_COMMUNITY): Payer: Self-pay | Admitting: Internal Medicine

## 2023-05-23 DIAGNOSIS — I10 Essential (primary) hypertension: Secondary | ICD-10-CM

## 2023-05-23 DIAGNOSIS — E119 Type 2 diabetes mellitus without complications: Secondary | ICD-10-CM

## 2023-05-23 DIAGNOSIS — M869 Osteomyelitis, unspecified: Secondary | ICD-10-CM | POA: Diagnosis not present

## 2023-05-23 DIAGNOSIS — L089 Local infection of the skin and subcutaneous tissue, unspecified: Secondary | ICD-10-CM | POA: Diagnosis not present

## 2023-05-23 DIAGNOSIS — E11628 Type 2 diabetes mellitus with other skin complications: Secondary | ICD-10-CM | POA: Diagnosis not present

## 2023-05-23 HISTORY — PX: AMPUTATION TOE: SHX6595

## 2023-05-23 LAB — GLUCOSE, CAPILLARY
Glucose-Capillary: 238 mg/dL — ABNORMAL HIGH (ref 70–99)
Glucose-Capillary: 204 mg/dL — ABNORMAL HIGH (ref 70–99)
Glucose-Capillary: 184 mg/dL — ABNORMAL HIGH (ref 70–99)
Glucose-Capillary: 172 mg/dL — ABNORMAL HIGH (ref 70–99)
Glucose-Capillary: 202 mg/dL — ABNORMAL HIGH (ref 70–99)
Glucose-Capillary: 386 mg/dL — ABNORMAL HIGH (ref 70–99)
Glucose-Capillary: 345 mg/dL — ABNORMAL HIGH (ref 70–99)
Glucose-Capillary: 332 mg/dL — ABNORMAL HIGH (ref 70–99)

## 2023-05-23 SURGERY — AMPUTATION, TOE
Anesthesia: Monitor Anesthesia Care | Site: Toe | Laterality: Right

## 2023-05-23 MED ORDER — LIDOCAINE HCL 1 % IJ SOLN
INTRAMUSCULAR | Status: DC | PRN
  Administered 2023-05-23: 10 mL

## 2023-05-23 MED ORDER — ONDANSETRON HCL 4 MG/2ML IJ SOLN
INTRAMUSCULAR | Status: AC
  Filled 2023-05-23: qty 2

## 2023-05-23 MED ORDER — LIDOCAINE 2% (20 MG/ML) 5 ML SYRINGE
INTRAMUSCULAR | Status: DC | PRN
  Administered 2023-05-23: 60 mg via INTRAVENOUS

## 2023-05-23 MED ORDER — CHLORHEXIDINE GLUCONATE 0.12 % MT SOLN
15.0000 mL | Freq: Once | OROMUCOSAL | Status: AC
  Administered 2023-05-23: 15 mL via OROMUCOSAL
  Filled 2023-05-23: qty 15

## 2023-05-23 MED ORDER — PROPOFOL 10 MG/ML IV BOLUS
INTRAVENOUS | Status: DC | PRN
  Administered 2023-05-23: 20 mg via INTRAVENOUS

## 2023-05-23 MED ORDER — LACTATED RINGERS IV SOLN
INTRAVENOUS | Status: DC
Start: 2023-05-23 — End: 2023-05-23

## 2023-05-23 MED ORDER — TRAMADOL 5 MG/ML ORAL SUSPENSION: 50.0000 mg | Freq: Four times a day (QID) | ORAL | Status: DC | PRN

## 2023-05-23 MED ORDER — ALPRAZOLAM 0.25 MG PO TABS
0.2500 mg | ORAL_TABLET | Freq: Two times a day (BID) | ORAL | Status: DC | PRN
  Administered 2023-05-23 (×2): 0.25 mg via ORAL
  Filled 2023-05-23 (×2): qty 1

## 2023-05-23 MED ORDER — PROPOFOL 1000 MG/100ML IV EMUL
INTRAVENOUS | Status: AC
Start: 2023-05-23 — End: ?
  Filled 2023-05-23: qty 100

## 2023-05-23 MED ORDER — INSULIN GLARGINE-YFGN 100 UNIT/ML ~~LOC~~ SOLN
35.0000 [IU] | Freq: Every day | SUBCUTANEOUS | Status: DC
  Administered 2023-05-23 – 2023-05-24 (×2): 35 [IU] via SUBCUTANEOUS
  Filled 2023-05-23 (×2): qty 0.35

## 2023-05-23 MED ORDER — TRAMADOL HCL 50 MG PO TABS
50.0000 mg | ORAL_TABLET | Freq: Four times a day (QID) | ORAL | Status: DC | PRN
  Administered 2023-05-24 (×2): 50 mg via ORAL
  Filled 2023-05-23 (×2): qty 1

## 2023-05-23 MED ORDER — MORPHINE SULFATE (PF) 2 MG/ML IV SOLN: 1.0000 mg | INTRAVENOUS | Status: DC | PRN

## 2023-05-23 MED ORDER — ONDANSETRON HCL 4 MG/2ML IJ SOLN
4.0000 mg | Freq: Once | INTRAMUSCULAR | Status: DC | PRN
Start: 2023-05-23 — End: 2023-05-23

## 2023-05-23 MED ORDER — 0.9 % SODIUM CHLORIDE (POUR BTL) OPTIME
TOPICAL | Status: DC | PRN
  Administered 2023-05-23: 1000 mL

## 2023-05-23 MED ORDER — INSULIN ASPART 100 UNIT/ML IJ SOLN
8.0000 [IU] | Freq: Once | INTRAMUSCULAR | Status: AC
  Administered 2023-05-23: 8 [IU] via SUBCUTANEOUS

## 2023-05-23 MED ORDER — SODIUM CHLORIDE 0.9 % IR SOLN
Status: DC | PRN
  Administered 2023-05-23: 1000 mL

## 2023-05-23 MED ORDER — BUPIVACAINE HCL (PF) 0.5 % IJ SOLN
INTRAMUSCULAR | Status: AC
  Filled 2023-05-23: qty 30

## 2023-05-23 MED ORDER — FENTANYL CITRATE (PF) 100 MCG/2ML IJ SOLN: 25.0000 ug | INTRAMUSCULAR | Status: DC | PRN

## 2023-05-23 MED ORDER — ORAL CARE MOUTH RINSE: 15.0000 mL | Freq: Once | OROMUCOSAL | Status: AC

## 2023-05-23 MED ORDER — ONDANSETRON HCL 4 MG/2ML IJ SOLN
INTRAMUSCULAR | Status: DC | PRN
  Administered 2023-05-23: 4 mg via INTRAVENOUS

## 2023-05-23 MED ORDER — ACETAMINOPHEN 10 MG/ML IV SOLN: 1000.0000 mg | Freq: Once | INTRAVENOUS | Status: DC | PRN

## 2023-05-23 MED ORDER — LIDOCAINE HCL (PF) 1 % IJ SOLN
INTRAMUSCULAR | Status: AC
  Filled 2023-05-23: qty 30

## 2023-05-23 MED ORDER — LIDOCAINE 2% (20 MG/ML) 5 ML SYRINGE
INTRAMUSCULAR | Status: AC
  Filled 2023-05-23: qty 5

## 2023-05-23 MED ORDER — LACTATED RINGERS IV SOLN: INTRAVENOUS | Status: DC | PRN

## 2023-05-23 MED ORDER — INSULIN ASPART 100 UNIT/ML IJ SOLN
0.0000 [IU] | INTRAMUSCULAR | Status: DC | PRN
  Administered 2023-05-23: 4 [IU] via SUBCUTANEOUS

## 2023-05-23 MED ORDER — PROPOFOL 500 MG/50ML IV EMUL
INTRAVENOUS | Status: DC | PRN
  Administered 2023-05-23: 100 ug/kg/min via INTRAVENOUS

## 2023-05-23 SURGICAL SUPPLY — 37 items
BLADE OSC/SAGITTAL MD 5.5X18 (BLADE) ×1 IMPLANT
BLADE SURG 10 STRL SS (BLADE) ×1 IMPLANT
BLADE SURG 15 STRL LF DISP TIS (BLADE) ×2 IMPLANT
BNDG COHESIVE 3X5 TAN ST LF (GAUZE/BANDAGES/DRESSINGS) ×1 IMPLANT
BNDG ELASTIC 3INX 5YD STR LF (GAUZE/BANDAGES/DRESSINGS) ×1 IMPLANT
BNDG ELASTIC 4INX 5YD STR LF (GAUZE/BANDAGES/DRESSINGS) IMPLANT
BNDG ESMARK 4X9 LF (GAUZE/BANDAGES/DRESSINGS) ×1 IMPLANT
BNDG GAUZE DERMACEA FLUFF 4 (GAUZE/BANDAGES/DRESSINGS) IMPLANT
CHLORAPREP W/TINT 26 (MISCELLANEOUS) ×1 IMPLANT
ELECT REM PT RETURN 9FT ADLT (ELECTROSURGICAL) ×1 IMPLANT
ELECTRODE REM PT RTRN 9FT ADLT (ELECTROSURGICAL) ×1 IMPLANT
GAUZE SPONGE 2X2 STRL 8-PLY (GAUZE/BANDAGES/DRESSINGS) ×2 IMPLANT
GAUZE SPONGE 4X4 12PLY STRL (GAUZE/BANDAGES/DRESSINGS) ×1 IMPLANT
GAUZE STRETCH 2X75IN STRL (MISCELLANEOUS) ×1 IMPLANT
GAUZE XEROFORM 1X8 LF (GAUZE/BANDAGES/DRESSINGS) ×1 IMPLANT
GLOVE BIO SURGEON STRL SZ7.5 (GLOVE) ×1 IMPLANT
GLOVE BIOGEL PI IND STRL 7.5 (GLOVE) ×1 IMPLANT
GOWN STRL REUS W/ TWL LRG LVL3 (GOWN DISPOSABLE) ×2 IMPLANT
HANDLE YANKAUER SUCT OPEN TIP (MISCELLANEOUS) IMPLANT
KIT BASIN OR (CUSTOM PROCEDURE TRAY) ×1 IMPLANT
NDL BIOPSY JAMSHIDI 8X6 (NEEDLE) IMPLANT
NDL HYPO 25X1 1.5 SAFETY (NEEDLE) ×2 IMPLANT
NEEDLE BIOPSY JAMSHIDI 8X6 (NEEDLE) IMPLANT
NEEDLE HYPO 25X1 1.5 SAFETY (NEEDLE) ×2 IMPLANT
PACK ORTHO EXTREMITY (CUSTOM PROCEDURE TRAY) ×1 IMPLANT
PADDING CAST ABS COTTON 4X4 ST (CAST SUPPLIES) ×2 IMPLANT
SET HNDPC FAN SPRY TIP SCT (DISPOSABLE) IMPLANT
SPIKE FLUID TRANSFER (MISCELLANEOUS) ×2 IMPLANT
STAPLER VISISTAT 35W (STAPLE) IMPLANT
STOCKINETTE 4X48 STRL (DRAPES) ×1 IMPLANT
SUT ETHILON 3 0 FSLX (SUTURE) ×1 IMPLANT
SUT PROLENE 3 0 PS 2 (SUTURE) IMPLANT
SUT PROLENE 4 0 PS 2 18 (SUTURE) ×2 IMPLANT
SYR CONTROL 10ML LL (SYRINGE) ×2 IMPLANT
TUBE CONNECTING 12X1/4 (SUCTIONS) IMPLANT
UNDERPAD 30X36 HEAVY ABSORB (UNDERPADS AND DIAPERS) ×1 IMPLANT
WATER STERILE IRR 1000ML POUR (IV SOLUTION) ×1 IMPLANT

## 2023-05-23 NOTE — Progress Notes (Signed)
Orthopedic Tech Progress Note Patient Details:  Raymond Pugh 1961/02/19 [id number]  Ortho Devices Type of Ortho Device: Postop shoe/boot Ortho Device/Splint Location: LLE Ortho Device/Splint Interventions: Ordered, Application, Adjustment, Removal   Post Interventions Patient Tolerated: Well Instructions Provided: Care of device  [name] 05/23/2023, 10:00 AM

## 2023-05-23 NOTE — Progress Notes (Signed)
History and Physical Interval Note:  05/23/2023 8:31 AM  Raymond Pugh  has presented today for surgery, with the diagnosis of Right 2nd toe osteomyelitis.  The various methods of treatment have been discussed with the patient and family. After consideration of risks, benefits and other options for treatment, the patient has consented to   Procedure(s) with comments: AMPUTATION TOE (Right) - Right 2nd toe amputation at MPJ level as a surgical intervention.  The patient's history has been reviewed, patient examined, no change in status, stable for surgery.  I have reviewed the patient's chart and labs.  Questions were answered to the patient's satisfaction.     Jenelle Mages Venecia Mehl

## 2023-05-23 NOTE — Progress Notes (Signed)
PROGRESS NOTE    Raymond Pugh  ZOX:[id number] DOB: March 26, 1961 DOA: 05/19/2023 PCP: Shade Flood, MD    Brief Narrative: 62 y.o. male with medical history significant of HTN, HLD, class 2 obesity, and DM who presented on 12/16 with R 2nd toe toe pain.   He has had a black spot on the back of his toe for a couple of weeks with poor toenail hygiene.  He then stubbed his toe a couple of days ago and the dorsal toe is red and angry-appearing.  No systemic symptoms. ER Course:  Injury of the right second toe which has been progressively getting worse since last week. X-ray of the right foot no acute osseous abnormality.  Focal soft tissue swelling/defect along with the tip of the second toe.  Concern for right toe osteomyelitis and started broad-spectrum antibiotic IV vancomycin and ceftriaxone.     Assessment & Plan:   Principal Problem:   Diabetic foot infection (HCC) Active Problems:   Hyperlipidemia   Attention deficit hyperactivity disorder (ADHD)   Essential hypertension   Uncontrolled type 2 diabetes mellitus with hyperglycemia, with long-term current use of insulin (HCC)   Osteomyelitis of second toe of right foot (HCC)    #1 diabetic toe infection MRI foot noted concerning for osteomyelitis and soft tissue inflammation/infection.  Continue Rocephin and vancomycin and Flagyl.  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  #2 type 2 diabetes continue SSI and insulin long-acting.A1c 9.6 CBG (last 3)  Recent Labs    05/23/23 0933 05/23/23 1043 05/23/23 1139  GLUCAP 184* 172* 202*   Dietary consult  Consult DM co ordinator  #3 hypertension -on ramipril metoprolol hydrochlorothiazide  #4 hyperlipidemia on statin  #5 anxiety ADHD on Xanax at home continue   Nutrition Problem: Increased nutrient needs Etiology: wound healing, post-op healing  Signs/Symptoms: estimated needs Interventions: MVI, Juven, Education,  Prostat  Estimated body mass index is 36.02 kg/m as calculated from the following:   Height as of this encounter: 5\' 7"  (1.702 m).   Weight as of this encounter: 104.3 kg.  DVT prophylaxis: LOVENOX Code Status FULL Family Communication: dw wife Disposition Plan:  Status is: Inpatient Remains inpatient appropriate because: osteomyelitis   Consultants: podiatry  Procedures: none Antimicrobials: Anti-infectives (From admission, onward)    Start     Dose/Rate Route Frequency Ordered Stop   05/21/23 0800  cefTRIAXone (ROCEPHIN) 2 g in sodium chloride 0.9 % 100 mL IVPB        2 g 200 mL/hr over 30 Minutes Intravenous Every 24 hours 05/20/23 0952 05/28/23 0759   05/20/23 2200  vancomycin (VANCOCIN) IVPB 1000 mg/200 mL premix        1,000 mg 200 mL/hr over 60 Minutes Intravenous Every 12 hours 05/20/23 1009     05/20/23 1015  vancomycin (VANCOCIN) IVPB 1000 mg/200 mL premix        1,000 mg 200 mL/hr over 60 Minutes Intravenous  Once 05/20/23 1009 05/20/23 1542   05/20/23 1000  metroNIDAZOLE (FLAGYL) IVPB 500 mg        500 mg 100 mL/hr over 60 Minutes Intravenous Every 8 hours 05/20/23 0952 05/27/23 0959   05/20/23 0530  vancomycin (VANCOCIN) IVPB 1000 mg/200 mL premix        1,000 mg 200 mL/hr over 60 Minutes Intravenous Every 1 hr x 2 05/20/23 0453 05/20/23 1155   05/20/23 0500  cefTRIAXone (ROCEPHIN) 2 g in sodium chloride 0.9 % 100  mL IVPB        2 g 200 mL/hr over 30 Minutes Intravenous  Once 05/20/23 0445 05/20/23 0603        Subjective: Seen prior to having surgery wife at bedside requesting dietary consult and diabetic coordinator consult to talk to husband and her regarding his uncontrolled diabetes and complications.  Objective: Vitals:   05/23/23 0930 05/23/23 0945 05/23/23 0950 05/23/23 1047  BP: 138/81 (!) 168/76  (!) 160/94  Pulse: 79 70 68 73  Resp: 14 15 15    Temp:   97.7 F (36.5 C) 98 F (36.7 C)  TempSrc:    Oral  SpO2: 93% 94% 96% 99%  Weight:       Height:        Intake/Output Summary (Last 24 hours) at 05/23/2023 1156 Last data filed at 05/23/2023 0909 Gross per 24 hour  Intake 1947.72 ml  Output 5 ml  Net 1942.72 ml    Filed Weights   05/19/23 1442 05/23/23 0819  Weight: 104.3 kg 104.3 kg    Examination:  General exam: Appears irritable Respiratory system: Clear to auscultation. Respiratory effort normal. Cardiovascular system: S1 & S2 heard, RRR. No JVD, murmurs, rubs, gallops or clicks. No pedal edema. Gastrointestinal system: Abdomen is nondistended, soft and nontender. No organomegaly or masses felt. Normal bowel sounds heard. Central nervous system: Alert and oriented. No focal neurological deficits. Extremities: foot covered   Data Reviewed: I have personally reviewed following labs and imaging studies  CBC: Recent Labs  Lab 05/19/23 1445 05/20/23 0806 05/21/23 0520 05/22/23 1204  WBC 10.7* 8.9 8.3 10.5  NEUTROABS 6.6 5.2  --   --   HGB 13.0 13.1 13.0 13.9  HCT 40.2 39.3 39.3 41.1  MCV 92.6 91.2 91.0 89.5  PLT 263 218 234 253   Basic Metabolic Panel: Recent Labs  Lab 05/19/23 1445 05/20/23 0806 05/21/23 0520 05/22/23 1204 05/22/23 2122  NA 137 134* 138 133* 133*  K 3.3* 3.3* 3.5 3.8 3.7  CL 103 101 103 99 98  CO2 26 26 27 25 22   GLUCOSE 151* 348* 216* 193* 488*  BUN 16 17 13 12 13   CREATININE 0.88 0.80 0.76 0.80 0.92  CALCIUM 8.9 8.1* 8.3* 8.4* 8.6*   GFR: Estimated Creatinine Clearance: 95.9 mL/min (by C-G formula based on SCr of 0.92 mg/dL). Liver Function Tests: Recent Labs  Lab 05/19/23 1445 05/22/23 1204  AST 27 24  ALT 27 19  ALKPHOS 82 73  BILITOT 0.5 0.7  PROT 6.4* 6.5  ALBUMIN 3.0* 2.8*   No results for input(s): "LIPASE", "AMYLASE" in the last 168 hours. No results for input(s): "AMMONIA" in the last 168 hours. Coagulation Profile: No results for input(s): "INR", "PROTIME" in the last 168 hours. Cardiac Enzymes: No results for input(s): "CKTOTAL", "CKMB",  "CKMBINDEX", "TROPONINI" in the last 168 hours. BNP (last 3 results) No results for input(s): "PROBNP" in the last 8760 hours. HbA1C: No results for input(s): "HGBA1C" in the last 72 hours.  CBG: Recent Labs  Lab 05/23/23 0458 05/23/23 0801 05/23/23 0933 05/23/23 1043 05/23/23 1139  GLUCAP 238* 204* 184* 172* 202*   Lipid Profile: No results for input(s): "CHOL", "HDL", "LDLCALC", "TRIG", "CHOLHDL", "LDLDIRECT" in the last 72 hours. Thyroid Function Tests: No results for input(s): "TSH", "T4TOTAL", "FREET4", "T3FREE", "THYROIDAB" in the last 72 hours. Anemia Panel: No results for input(s): "VITAMINB12", "FOLATE", "FERRITIN", "TIBC", "IRON", "RETICCTPCT" in the last 72 hours. Sepsis Labs: Recent Labs  Lab 05/19/23 1456  LATICACIDVEN 1.1  No results found for this or any previous visit (from the past 240 hours).       Radiology Studies: No results found.   Scheduled Meds:  ascorbic acid  500 mg Oral Daily   gabapentin  100 mg Oral BID   hydrochlorothiazide  25 mg Oral Daily   insulin aspart  0-20 Units Subcutaneous TID WC   insulin aspart  0-5 Units Subcutaneous QHS   insulin glargine-yfgn  35 Units Subcutaneous Daily   metoprolol tartrate  100 mg Oral BID   pantoprazole  40 mg Oral Daily   pravastatin  20 mg Oral q1800   ramipril  10 mg Oral Daily   zinc sulfate (50mg  elemental zinc)  220 mg Oral Daily   Continuous Infusions:  cefTRIAXone (ROCEPHIN)  IV 2 g (05/22/23 0836)   metronidazole 500 mg (05/23/23 0214)   vancomycin 1,000 mg (05/23/23 0019)     LOS: 3 days    Time spent: 24 MIN Alwyn Ren, MD  05/23/2023, 11:56 AM

## 2023-05-23 NOTE — Plan of Care (Signed)

## 2023-05-23 NOTE — Transfer of Care (Signed)
Immediate Anesthesia Transfer of Care Note  Patient: AMYR WASZAK  Procedure(s) Performed: AMPUTATION TOE (Right: Toe)  Patient Location: PACU  Anesthesia Type:MAC  Level of Consciousness: awake, alert , and oriented  Airway & Oxygen Therapy: Patient Spontanous Breathing and Patient connected to nasal cannula oxygen  Post-op Assessment: Report given to RN and Post -op Vital signs reviewed and stable  Post vital signs: Reviewed and stable  Last Vitals:  Vitals Value Taken Time  BP 128/83 05/23/23 0920  Temp    Pulse 77 05/23/23 0921  Resp 14 05/23/23 0921  SpO2 93 % 05/23/23 0921  Vitals shown include unfiled device data.  Last Pain:  Vitals:   05/23/23 0832  TempSrc:   PainSc: 0-No pain         Complications: No notable events documented.

## 2023-05-23 NOTE — Anesthesia Postprocedure Evaluation (Signed)
Anesthesia Post Note  Patient: Raymond Pugh  Procedure(s) Performed: AMPUTATION TOE (Right: Toe)     Patient location during evaluation: PACU Anesthesia Type: Regional Level of consciousness: awake and alert Pain management: pain level controlled Vital Signs Assessment: post-procedure vital signs reviewed and stable Respiratory status: spontaneous breathing, nonlabored ventilation and respiratory function stable Cardiovascular status: blood pressure returned to baseline Postop Assessment: no apparent nausea or vomiting Anesthetic complications: no   No notable events documented.        Shanda Howells

## 2023-05-23 NOTE — Op Note (Signed)
Full Operative Report  Date of Operation: 9:20 AM, 05/23/2023   Patient: Raymond Pugh - 62 y.o. male  Surgeon: Pilar Plate, DPM   Assistant: None  Diagnosis: osteomyelitis of right second toe  Procedure:  1. Amputation of second toe at MPJ level, right foot    Anesthesia: Monitor Anesthesia Care  Trevor Iha, MD  Anesthesiologist: Trevor Iha, MD CRNA: Little Ishikawa, CRNA   Estimated Blood Loss: Minimal  Hemostasis: 1) Anatomical dissection, mechanical compression, electrocautery 2) Minimal  Implants: * No implants in log *  Materials: 3-0 prolene  Injectables: 1) Pre-operatively: 10 cc of 50:50 mixture 1%lidocaine plain and 0.5% marcaine plain 2) Post-operatively: None   Specimens: Pathology: 2nd toe for path  Microbiology: bone from 2nd toe for culture   Antibiotics: IV antibiotics given per schedule on the floor  Drains: None  Complications: Patient tolerated the procedure well without complication.   Operative findings: As below in detailed report  Indications for Procedure: Raymond Pugh presents to [facility], DPM with a chief complaint of ulceration and necrosis with concern for underlying osteomyelitis of the right 2nd toe. The patient has failed conservative treatments of various modalities. At this time the patient has elected to proceed with surgical correction. All alternatives, risks, and complications of the procedures were thoroughly explained to the patient. Patient exhibits appropriate understanding of all discussion points and informed consent was signed and obtained in the chart with no guarantees to surgical outcome given or implied.  Description of Procedure: Patient was brought to the operating room. Patient remained on their hospital bed in the supine position. A surgical timeout was performed and all members of the operating room, the procedure, and the surgical site were identified. anesthesia occurred  as per anesthesia record. Local anesthetic as previously described was then injected about the operative field in a local infiltrative block.  The operative lower extremity as noted above was then prepped and draped in the usual sterile manner. The following procedure then began.  Attention was directed to the 2nd digit on the right foot. A full-thickness incision encompassing the entire digit was made using a #15 blade. Dissection was carried down to bone. The toe was secured with a towel clamp, further dissected in its entirety, and disarticulated at the MPJ. and passed to the back table as a gross specimen. This was then labled and sent to pathology. The bone was noted to be soft and eroded, and consistent with osteomyelitis. A bone culture was taken from this toe and sent to micro. All remaining necrotic and devitalized soft tissue structures were visualized and dissected away using sharp and dull dissection. Care was taken to protect all neurovascular structures throughout the dissection. All bleeders were cauterized as necessary.  . The area was then flushed with copious amounts of sterile saline. Then using the suture materials previously described, the site was closed in anatomic layers and the skin was well approximated under minimal tension.  The surgical site was then dressed with xeroform 4x4 kerlix and ace. The patient tolerated both the procedure and anesthesia well with vital signs stable throughout. The patient was transferred in good condition and all vital signs stable  from the OR to recovery under the discretion of anesthesia.  Condition: Vital signs stable, neurovascular status unchanged from preoperative   Surgical plan:  Expect clean margins. Post op shoe, weightbearing as tolerated. Follow up 7-10 days in office. Stable for discharge. 7 days augmentin on DC.  The patient will be WBAT in a post op shoe to the operative limb until further instructed. The dressing is to remain clean,  dry, and intact. Will continue to follow unless noted elsewhere.   Carlena Hurl, DPM [facility]

## 2023-05-23 NOTE — Progress Notes (Signed)
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  Patient received post-op shoe.  No TOC needs at this time.  Patient should discharge to home once medically stable for discharge.   Transition of Care Asessment: Insurance and Status: (P) Insurance coverage has been reviewed Patient has primary care physician: (P) Yes Home environment has been reviewed: (P) from home Prior level of function:: (P) INdependent Prior/Current Home Services: (P) No current home services Social Drivers of Health Review: (P) SDOH reviewed no interventions necessary Readmission risk has been reviewed: (P) Yes Transition of care needs: (P) no transition of care needs at this time

## 2023-05-24 ENCOUNTER — Encounter (HOSPITAL_COMMUNITY): Payer: Self-pay | Admitting: Podiatry

## 2023-05-24 ENCOUNTER — Encounter (HOSPITAL_COMMUNITY): Payer: 59

## 2023-05-24 DIAGNOSIS — E11628 Type 2 diabetes mellitus with other skin complications: Secondary | ICD-10-CM | POA: Diagnosis not present

## 2023-05-24 DIAGNOSIS — L089 Local infection of the skin and subcutaneous tissue, unspecified: Secondary | ICD-10-CM | POA: Diagnosis not present

## 2023-05-24 LAB — GLUCOSE, CAPILLARY
Glucose-Capillary: 348 mg/dL — ABNORMAL HIGH (ref 70–99)
Glucose-Capillary: 224 mg/dL — ABNORMAL HIGH (ref 70–99)

## 2023-05-24 LAB — SURGICAL PATHOLOGY

## 2023-05-24 MED ORDER — TRAMADOL HCL 50 MG PO TABS: 50.0000 mg | ORAL_TABLET | Freq: Four times a day (QID) | ORAL | 0 refills | Status: DC | PRN

## 2023-05-24 MED ORDER — MELOXICAM 15 MG PO TABS: 15.0000 mg | ORAL_TABLET | Freq: Every day | ORAL | 0 refills | Status: DC

## 2023-05-24 MED ORDER — OXYCODONE HCL 5 MG PO TABS: 5.0000 mg | ORAL_TABLET | ORAL | 0 refills | Status: DC | PRN

## 2023-05-24 MED ORDER — AMOXICILLIN-POT CLAVULANATE 875-125 MG PO TABS
1.0000 | ORAL_TABLET | Freq: Two times a day (BID) | ORAL | Status: DC
  Administered 2023-05-24: 1 via ORAL
  Filled 2023-05-24: qty 1

## 2023-05-24 MED ORDER — INSULIN GLARGINE 100 UNITS/ML SOLOSTAR PEN: 40.0000 [IU] | PEN_INJECTOR | Freq: Every day | SUBCUTANEOUS | 11 refills | Status: DC

## 2023-05-24 MED ORDER — AMOXICILLIN-POT CLAVULANATE 875-125 MG PO TABS: 1.0000 | ORAL_TABLET | Freq: Two times a day (BID) | ORAL | 0 refills | Status: DC

## 2023-05-24 MED ORDER — ACETAMINOPHEN 325 MG PO TABS: 650.0000 mg | ORAL_TABLET | Freq: Four times a day (QID) | ORAL | Status: AC | PRN

## 2023-05-24 MED ORDER — INSULIN REGULAR HUMAN 100 UNIT/ML IJ SOLN: 16.0000 [IU] | Freq: Three times a day (TID) | INTRAMUSCULAR | 0 refills | Status: DC

## 2023-05-24 MED ORDER — ZINC SULFATE 220 (50 ZN) MG PO CAPS: 220.0000 mg | ORAL_CAPSULE | Freq: Every day | ORAL | Status: AC

## 2023-05-24 MED ORDER — ASCORBIC ACID 500 MG PO TABS: 500.0000 mg | ORAL_TABLET | Freq: Every day | ORAL | Status: AC

## 2023-05-24 NOTE — Progress Notes (Addendum)
 Nutrition Follow-up  DOCUMENTATION CODES:   Obesity unspecified  INTERVENTION:  Education provided in discharge paper work.  And by RD in person.    NUTRITION DIAGNOSIS:   Limited adherence to nutrition-related recommendations related to limited prior education as evidenced by per patient/family report.    GOAL:   Patient will meet greater than or equal to 90% of their needs    MONITOR:   PO intake  REASON FOR ASSESSMENT:   Consult Diet education  ASSESSMENT:   Pt admitted from home with c/o R second toe pain d/t ulceration and osteomyelitis. PMH significant for HTN, HLD, obesity and DM.  RD provided education on Diabetes. Pt states no questions.  Continue to have good appetite with no concerns. Noted. Handed  My plate education print out to pt who handed to wife.   12/30- amputation of second toe at MP J level right foot.  Admit weight: 104.3  Weight history:  05/23/23 104.3 kg  05/03/23 103.4 kg  02/01/23 102.3 kg  11/10/22 98.8 kg  09/28/22 99.5 kg   Average Meal Intake: 100% x 1 documented meal  Nutritionally Relevant Medications: Scheduled Meds:  ascorbic acid   500 mg Oral Daily   gabapentin   100 mg Oral BID   hydrochlorothiazide   25 mg Oral Daily   metoprolol  tartrate  100 mg Oral BID   zinc  sulfate (50mg  elemental zinc )  220 mg Oral Daily    Labs Reviewed    NUTRITION - FOCUSED PHYSICAL EXAM:  Flowsheet Row Most Recent Value  Orbital Region No depletion  Upper Arm Region No depletion  Thoracic and Lumbar Region No depletion  Buccal Region No depletion  Temple Region No depletion  Clavicle Bone Region No depletion  Clavicle and Acromion Bone Region No depletion  Scapular Bone Region No depletion  Dorsal Hand No depletion  Patellar Region No depletion  Anterior Thigh Region No depletion  Posterior Calf Region No depletion  Edema (RD Assessment) Moderate  Hair Reviewed  Eyes Reviewed  Mouth Reviewed  Skin Reviewed       Diet  Order:   Diet Order             Diet - low sodium heart healthy           Diet Carb Modified Fluid consistency: Thin; Room service appropriate? Yes  Diet effective now                   EDUCATION NEEDS:   Education needs have been addressed  Skin:  Skin Assessment: Skin Integrity Issues: Skin Integrity Issues:: Other (Comment) Other: Amputation 2nd toe right foot  Last BM:  12/30  Height:   Ht Readings from Last 1 Encounters:  05/23/23 5' 7 (1.702 m)    Weight:   Wt Readings from Last 1 Encounters:  05/23/23 104.3 kg    Ideal Body Weight:  67.3 kg  BMI:  Body mass index is 36.02 kg/m.  Estimated Nutritional Needs:   Kcal:  1700-1900  Protein:  90-105g  Fluid:  >/=1.7L    Jenna Pew RDN, LDN Clinical Dietitian   If unable to reach, please contact RD Inpatient secure chat group between 8 am-4 pm daily

## 2023-05-24 NOTE — Plan of Care (Signed)
  Problem: Coping: Goal: Level of anxiety will decrease Outcome: Progressing   Problem: Pain Management: Goal: General experience of comfort will improve Outcome: Progressing

## 2023-05-24 NOTE — Discharge Summary (Signed)
 Physician Discharge Summary  Raymond Pugh FMW:[id number] DOB: 01-Jul-1960 DOA: 05/19/2023  PCP: Levora Reyes SAUNDERS, MD  Admit date: 05/19/2023 Discharge date: 05/24/2023  Admitted From: Home Disposition: Home  Recommendations for Outpatient Follow-up:  Follow up with PCP in 1-2 weeks Please obtain BMP/CBC in one week Please follow up with the podiatrist in 1 week  Home Health: None Equipment/Devices none  Discharge Condition: Stable CODE STATUS: Full code  Diet recommendation: Carb modified Brief/Interim Summary: : 62 y.o. male with medical history significant of HTN, HLD, class 2 obesity, and DM who presented on 12/16 with R 2nd toe toe pain.   He has had a black spot on the back of his toe for a couple of weeks with poor toenail hygiene.  He then stubbed his toe a couple of days ago and the dorsal toe is red and angry-appearing.  No systemic symptoms. ER Course:  Injury of the right second toe which has been progressively getting worse since last week. X-ray of the right foot no acute osseous abnormality.  Focal soft tissue swelling/defect along with the tip of the second toe.  Concern for right toe osteomyelitis and started broad-spectrum antibiotic IV vancomycin  and ceftriaxone .   Discharge Diagnoses:  Principal Problem:   Diabetic foot infection (HCC) Active Problems:   Hyperlipidemia   Attention deficit hyperactivity disorder (ADHD)   Essential hypertension   Uncontrolled type 2 diabetes mellitus with hyperglycemia, with long-term current use of insulin  (HCC)   Osteomyelitis of second toe of right foot (HCC)    #1 diabetic toe infection MRI foot noted concerning for osteomyelitis and soft tissue inflammation/infection.  He was treated with vancomycin  Rocephin  and Flagyl . 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  He was discharged on Augmentin  for 7 more days he will follow-up with the podiatrist within 7 to 10 days.  He is weightbearing  as tolerated and he was told as not to change the dressing and keep it clean dry and intact.   #2 type 2 diabetes continue SSI and insulin  long-acting.A1c 9.6 he was seen by diabetic coordinator and dietary prior to discharge. CBG (last 3)  Recent Labs (last 2 labs)       Recent Labs    05/23/23 0933 05/23/23 1043 05/23/23 1139  GLUCAP 184* 172* 202*       #3 hypertension -on ramipril  metoprolol  hydrochlorothiazide    #4 hyperlipidemia on statin   #5 anxiety ADHD on Xanax  at home continue    Nutrition Problem: Limited adherence to nutrition-related recommendations Etiology: limited prior education    Signs/Symptoms: per patient/family report     Interventions: Education  Estimated body mass index is 36.02 kg/m as calculated from the following:   Height as of this encounter: 5' 7 (1.702 m).   Weight as of this encounter: 104.3 kg.  Discharge Instructions  Discharge Instructions     Call MD for:  redness, tenderness, or signs of infection (pain, swelling, redness, odor or green/yellow discharge around incision site)   Complete by: As directed    Call MD for:  severe uncontrolled pain   Complete by: As directed    Call MD for:  temperature >100.4   Complete by: As directed    Diet - low sodium heart healthy   Complete by: As directed    Discharge wound care:   Complete by: As directed    Keep dressing clean dry intact till seen by podiatrist in 1 week  Increase activity slowly   Complete by: As directed       Allergies as of 05/24/2023       Reactions   Hydrocodone  Nausea Only   Yellow Dyes (non-tartrazine) Hives, Itching        Medication List     STOP taking these medications    Dexcom G6 Receiver Devi   Semaglutide (0.25 or 0.5MG /DOS) 2 MG/3ML Sopn       TAKE these medications    acetaminophen  325 MG tablet Commonly known as: TYLENOL  Take 2 tablets (650 mg total) by mouth every 6 (six) hours as needed for mild pain (pain score 1-3)  (or Fever >/= 101).   ALPRAZolam  0.5 MG tablet Commonly known as: Xanax  Take 1 tablet (0.5 mg total) by mouth 2 (two) times daily as needed for anxiety.   amoxicillin -clavulanate 875-125 MG tablet Commonly known as: AUGMENTIN  Take 1 tablet by mouth 2 (two) times daily.   amphetamine -dextroamphetamine 20 MG 24 hr capsule Commonly known as: Adderall XR Take 1 Capsules by mouth every day prn   ascorbic acid  500 MG tablet Commonly known as: VITAMIN C  Take 1 tablet (500 mg total) by mouth daily. Start taking on: May 25, 2023   Dexcom G6 Sensor Misc Use as instructed to check blood sugar change every 10 days   Dexcom G6 Transmitter Misc Change every 90 days   gabapentin  100 MG capsule Commonly known as: NEURONTIN  TAKE 1 CAPSULE(100 MG) BY MOUTH TWICE DAILY What changed:  how much to take how to take this when to take this reasons to take this additional instructions   hydrochlorothiazide  25 MG tablet Commonly known as: HYDRODIURIL  TAKE 1 TABLET(25 MG) BY MOUTH DAILY   insulin  glargine 100 unit/mL Sopn Commonly known as: LANTUS  Inject 40 Units into the skin daily. What changed:  how much to take Another medication with the same name was removed. Continue taking this medication, and follow the directions you see here.   insulin  regular 100 units/mL injection Commonly known as: NovoLIN R ReliOn Inject 0.16-0.2 mLs (16-20 Units total) into the skin 3 (three) times daily before meals. NO FURTHER REFILLS WITHOUT APPOINTMENT What changed:  how much to take additional instructions   lovastatin  20 MG tablet Commonly known as: MEVACOR  Take 1 tablet (20 mg total) by mouth at bedtime. TAKE 1 TABLET(20 MG) BY MOUTH AT BEDTIME   meloxicam  15 MG tablet Commonly known as: MOBIC  Take 1 tablet (15 mg total) by mouth daily.   metFORMIN  1000 MG tablet Commonly known as: GLUCOPHAGE  TAKE 1 TABLET BY MOUTH TWICE DAILY   metoprolol  tartrate 100 MG tablet Commonly known as:  LOPRESSOR  TAKE 1 TABLET(100 MG) BY MOUTH TWICE DAILY   omeprazole  20 MG capsule Commonly known as: PRILOSEC TAKE 1 CAPSULE BY MOUTH IN THE MORNING   onetouch ultrasoft lancets Use to check blood sugar 3 times a day   ramipril  10 MG capsule Commonly known as: ALTACE  TAKE 1 CAPSULE(10 MG) BY MOUTH TWICE DAILY   ReliOn Insulin  Syringe 31G X 15/64 0.5 ML Misc Generic drug: Insulin  Syringe-Needle U-100 Use to inject insulin  3 times a day. NO REFILLS WITHOUT APPOINTMENT   traMADol  50 MG tablet Commonly known as: ULTRAM  Take 1 tablet (50 mg total) by mouth every 6 (six) hours as needed for severe pain (pain score 7-10).   triamcinolone  0.1 % paste Commonly known as: KENALOG  Use as directed 1 Application in the mouth or throat 2 (two) times daily. To tongue.   zinc  sulfate (50mg   elemental zinc ) 220 (50 Zn) MG capsule Take 1 capsule (220 mg total) by mouth daily. Start taking on: May 25, 2023               Discharge Care Instructions  (From admission, onward)           Start     Ordered   05/24/23 0000  Discharge wound care:       Comments: Keep dressing clean dry intact till seen by podiatrist in 1 week   05/24/23 1103            Allergies  Allergen Reactions   Hydrocodone  Nausea Only   Yellow Dyes (Non-Tartrazine) Hives and Itching    Consultations: Podiatry   Procedures/Studies: DG Foot 2 Views Right Result Date: 05/23/2023 CLINICAL DATA:  Status post second toe amputation. EXAM: RIGHT FOOT - 2 VIEW COMPARISON:  Right foot x-rays dated May 19, 2023. FINDINGS: Interval amputation of the second toe. No acute fracture or dislocation. Similar mild dorsal degenerative spurring of the midfoot. Expected postsurgical changes in the soft tissues near the amputation site. IMPRESSION: 1. Interval amputation of the second toe. Electronically Signed   By: Elsie ONEIDA Shoulder M.D.   On: 05/23/2023 13:53   MR TOES RIGHT W WO CONTRAST Result Date:  05/20/2023 CLINICAL DATA:  Foot swelling. Diabetes. Osteomyelitis suspected. Stubbed toe a few days ago at home. Worsening right second toe pain, redness, and swelling. Starting to blister. EXAM: MRI OF THE RIGHT TOES WITHOUT AND WITH CONTRAST TECHNIQUE: Multiplanar, multisequence MR imaging of the right forefoot was performed both before and after administration of intravenous contrast. CONTRAST:  10mL GADAVIST  GADOBUTROL  1 MMOL/ML IV SOLN COMPARISON:  Right foot radiographs 05/19/2023 and 06/22/2021 FINDINGS: Bones/Joint/Cartilage In the region of the skin ulcer at the distal tip of second toe described below, there is suspicion for minimal 1-2 mm cortical step-off of a tiny 3 x 6 mm (long axis of the foot by short axis of the foot) tiny fracture fragment. Given the history of trauma to this region a few days ago, this is consistent with an acute to subacute nondisplaced tiny fracture of the distal tip of the distal phalanx of the second toe. There is diffuse marrow edema throughout the second toe distal phalanx. There is moderate patchy marrow edema within the tarsometatarsal joints, greatest within the second tarsometatarsal joint. Mild-to-moderate degenerative cystic changes on both sides of the third tarsometatarsal joint. Cartilage thinning is greatest at the second through fourth tarsometatarsal joints. Ligaments The Lisfranc ligament complex is intact. The metatarsophalangeal and interphalangeal collateral ligaments appear intact. Muscles and Tendons There is moderate nonspecific edema seen throughout the plantar greater than dorsal midfoot to forefoot musculature. No tendon tear is seen. Soft tissues There is a skin defect and thinning of the subcutaneous fat at the distal tip of second toe (sagittal series 8, image 17). There is intermediate STIR signal within the remaining subcutaneous fat/soft tissues measuring up to approximately 4 mm in thickness contacting the distal aspect of the distal phalanx.  There is mild-to-moderate subcutaneous fat edema and swelling within the dorsal midfoot to forefoot. There is a small 3 mm ganglion at the plantar medial aspect of the proximal portion of the proximal phalanx of the fifth toe (axial series 6, image 27 and coronal series 7, image 7). IMPRESSION: 1. There is a skin defect and thinning of the subcutaneous fat at the distal tip of the second toe. This may represent posttraumatic soft tissue injury versus small amount  of infectious/inflammatory phlegmon. At the same time, reportedly the patient has had trauma in this region. Recommend clinical correlation for the level of concern for soft tissue infection versus just posttraumatic injury. 2. There is cortical step-off at the dorsal and plantar aspects of the distal portion of the distal phalanx of the second toe (sagittal series 8, image 16). Diffuse marrow edema throughout the second toe distal phalanx. Given the history of trauma to this region a few days ago, this is consistent with an acute to subacute nondisplaced tiny fracture of the distal tip of the distal phalanx of the second toe. Again, clinical correlation is needed to assess for the level of concern of infection versus just posttraumatic injury in the second toe. The cortical step-off suggests there is a posttraumatic fracture within the distal aspect of the distal phalanx, and the distal phalanx marrow edema may all be due to this acute trauma. At the same time, if there are signs of cellulitis and soft tissue infection adjacent to the distal phalanx of the second toe, the marrow edema within the second toe distal phalanx could be secondary to superimposed osteomyelitis. 3. There is moderate nonspecific edema seen throughout the plantar greater than dorsal midfoot to forefoot musculature. This may be secondary to diabetic myopathy. 4. There is mild-to-moderate subcutaneous fat edema and swelling within the dorsal midfoot to forefoot. Electronically Signed    By: Tanda Lyons M.D.   On: 05/20/2023 08:53   DG Foot Complete Right Result Date: 05/19/2023 CLINICAL DATA:  Toe pain.  Injury. EXAM: RIGHT FOOT COMPLETE - 3+ VIEW COMPARISON:  06/22/2021. FINDINGS: No acute fracture or dislocation. No aggressive osseous lesion. Mild diffuse degenerative changes of imaged joints. Calcaneal spur noted along the Achilles tendon and Plantar aponeurosis attachment sites. There is focal soft tissue swelling/probable soft tissue defect along the tip of second toe. However, underlying bone is intact. No bone erosions. No radiopaque foreign bodies. IMPRESSION: *No acute osseous abnormality of the right foot. *Focal soft tissue swelling/defect along the tip of second toe. Electronically Signed   By: Ree Molt M.D.   On: 05/19/2023 16:04   DG Cervical Spine 2 or 3 views Result Date: 05/11/2023 CLINICAL DATA:  Neck discomfort. EXAM: CERVICAL SPINE - 2-3 VIEW COMPARISON:  None Available. FINDINGS: No fracture or bone lesion.  No malalignment. Minor loss of disc height at C5-C6. Remaining disc spaces are well preserved. There are endplate osteophytes anteriorly from C3 through C7. Soft tissues are unremarkable. IMPRESSION: 1. No fracture or acute finding.  No malalignment. 2. Mild degenerative changes as detailed. Electronically Signed   By: Alm Parkins M.D.   On: 05/11/2023 11:31   (Echo, Carotid, EGD, Colonoscopy, ERCP)    Subjective: Sitting by the side of the bed in no acute distress  Discharge Exam: Vitals:   05/24/23 0510 05/24/23 0745  BP: (!) 149/75 (!) 177/97  Pulse: 65 69  Resp: 18 18  Temp: (!) 97.5 F (36.4 C)   SpO2: 96% 97%   Vitals:   05/23/23 2019 05/23/23 2139 05/24/23 0510 05/24/23 0745  BP: (!) 161/82 (!) 161/82 (!) 149/75 (!) 177/97  Pulse: 74 74 65 69  Resp: 18  18 18   Temp: (!) 97.5 F (36.4 C)  (!) 97.5 F (36.4 C)   TempSrc: Oral     SpO2: 99%  96% 97%  Weight:      Height:        General: Pt is alert, awake, not in acute  distress  Cardiovascular: RRR, S1/S2 +, no rubs, no gallops Respiratory: CTA bilaterally, no wheezing, no rhonchi Abdominal: Soft, NT, ND, bowel sounds + Extremities: Foot covered by dressing   The results of significant diagnostics from this hospitalization (including imaging, microbiology, ancillary and laboratory) are listed below for reference.     Microbiology: Recent Results (from the past 240 hours)  Aerobic/Anaerobic Culture w Gram Stain (surgical/deep wound)     Status: None (Preliminary result)   Collection Time: 05/23/23  9:09 AM   Specimen: PATH Digit amputation; Tissue  Result Value Ref Range Status   Specimen Description TISSUE  Final   Special Requests right 2nd toe  Final   Gram Stain   Final    NO WBC SEEN NO ORGANISMS SEEN Performed at [facility], [address], [place], [place] [address]    Culture RARE STAPHYLOCOCCUS AUREUS  Final   Report Status PENDING  Incomplete     Labs: BNP (last 3 results) No results for input(s): BNP in the last 8760 hours. Basic Metabolic Panel: Recent Labs  Lab 05/19/23 1445 05/20/23 0806 05/21/23 0520 05/22/23 1204 05/22/23 2122  NA 137 134* 138 133* 133*  K 3.3* 3.3* 3.5 3.8 3.7  CL 103 101 103 99 98  CO2 26 26 27 25 22   GLUCOSE 151* 348* 216* 193* 488*  BUN 16 17 13 12 13   CREATININE 0.88 0.80 0.76 0.80 0.92  CALCIUM  8.9 8.1* 8.3* 8.4* 8.6*   Liver Function Tests: Recent Labs  Lab 05/19/23 1445 05/22/23 1204  AST 27 24  ALT 27 19  ALKPHOS 82 73  BILITOT 0.5 0.7  PROT 6.4* 6.5  ALBUMIN 3.0* 2.8*   No results for input(s): LIPASE, AMYLASE in the last 168 hours. No results for input(s): AMMONIA in the last 168 hours. CBC: Recent Labs  Lab 05/19/23 1445 05/20/23 0806 05/21/23 0520 05/22/23 1204  WBC 10.7* 8.9 8.3 10.5  NEUTROABS 6.6 5.2  --   --   HGB 13.0 13.1 13.0 13.9  HCT 40.2 39.3 39.3 41.1  MCV 92.6 91.2 91.0 89.5  PLT 263 218 234 253   Cardiac Enzymes: No results for  input(s): CKTOTAL, CKMB, CKMBINDEX, TROPONINI in the last 168 hours. BNP: Invalid input(s): POCBNP CBG: Recent Labs  Lab 05/23/23 1601 05/23/23 2023 05/23/23 2314 05/24/23 0514 05/24/23 0746  GLUCAP 386* 345* 332* 348* 224*   D-Dimer No results for input(s): DDIMER in the last 72 hours. Hgb A1c No results for input(s): HGBA1C in the last 72 hours. Lipid Profile No results for input(s): CHOL, HDL, LDLCALC, TRIG, CHOLHDL, LDLDIRECT in the last 72 hours. Thyroid function studies No results for input(s): TSH, T4TOTAL, T3FREE, THYROIDAB in the last 72 hours.  Invalid input(s): FREET3 Anemia work up No results for input(s): VITAMINB12, FOLATE, FERRITIN, TIBC, IRON, RETICCTPCT in the last 72 hours. Urinalysis    Component Value Date/Time   COLORURINE YELLOW 01/21/2016 2015   APPEARANCEUR CLEAR 01/21/2016 2015   LABSPEC 1.029 01/21/2016 2015   PHURINE 5.0 01/21/2016 2015   GLUCOSEU >1000 (A) 01/21/2016 2015   HGBUR NEGATIVE 01/21/2016 2015   BILIRUBINUR negative 12/29/2017 1632   KETONESUR negative 12/29/2017 1632   KETONESUR NEGATIVE 01/21/2016 2015   PROTEINUR =30 (A) 12/29/2017 1632   PROTEINUR NEGATIVE 01/21/2016 2015   UROBILINOGEN 0.2 12/29/2017 1632   UROBILINOGEN 0.2 05/02/2014 2046   NITRITE Negative 12/29/2017 1632   NITRITE NEGATIVE 01/21/2016 2015   LEUKOCYTESUR Negative 12/29/2017 1632   Sepsis Labs Recent Labs  Lab  05/19/23 1445 05/20/23 0806 05/21/23 0520 05/22/23 1204  WBC 10.7* 8.9 8.3 10.5   Microbiology Recent Results (from the past 240 hours)  Aerobic/Anaerobic Culture w Gram Stain (surgical/deep wound)     Status: None (Preliminary result)   Collection Time: 05/23/23  9:09 AM   Specimen: PATH Digit amputation; Tissue  Result Value Ref Range Status   Specimen Description TISSUE  Final   Special Requests right 2nd toe  Final   Gram Stain   Final    NO WBC SEEN NO ORGANISMS SEEN Performed at  [facility], [address] [place], [place] [address]    Culture RARE STAPHYLOCOCCUS AUREUS  Final   Report Status PENDING  Incomplete     Time coordinating discharge: 38 minutes  SIGNED:   Almarie KANDICE Hoots, MD  [facility] 05/24/2023, 4:16 PM

## 2023-05-24 NOTE — Progress Notes (Signed)
Podiatry recommended 7d Augmentin post amputation since clean margins. Ok to transition per Dr. Ashley Royalty.  Ulyses Southward, PharmD, BCIDP, AAHIVP, CPP Infectious Disease Pharmacist 05/24/2023 9:10 AM

## 2023-05-24 NOTE — Inpatient Diabetes Management (Addendum)
 Inpatient Diabetes Program Recommendations  AACE/ADA: New Consensus Statement on Inpatient Glycemic Control (2015)  Target Ranges:  Prepandial:   less than 140 mg/dL      Peak postprandial:   less than 180 mg/dL (1-2 hours)      Critically ill patients:  140 - 180 mg/dL   Lab Results  Component Value Date   GLUCAP 224 (H) 05/24/2023   HGBA1C 9.6 (H) 05/20/2023    Review of Glycemic Control  Latest Reference Range & Units 05/23/23 11:39 05/23/23 16:01 05/23/23 20:23 05/23/23 23:14 05/24/23 05:14 05/24/23 07:46  Glucose-Capillary 70 - 99 mg/dL 797 (H) 613 (H) 654 (H) 332 (H) 348 (H) 224 (H)  (H): Data is abnormally high  Diabetes history: DM2 Outpatient Diabetes medications: Basaglar  30 units every day, Novolin R 16-25 units TID, Metformin  1000 mg BID, Ozempic  0.5 mg weekly Current orders for Inpatient glycemic control: Semglee  35 units every day, Novolog  0-20 units TID and 0-5 units QHS  Inpatient Diabetes Program Recommendations:    Semglee  40 units every day Novolog  4 units TID with meals if he consumes at least 50% Novolog  0-15 units TID and 0-5 units at bedtime   Received referral for A1C > 10%.  Current A1C is 9.6% which is down slightly from 10.6% in September 2024.  He is current with endocrinologist, Dr. Trixie.  Ordered the Living Well with Diabetes  booklet.   Addendum@[id number] :  Met with patient and spouse at bedside.  He has cut out regular Coke since last A1C of 10.6%. We discussed importance of good glucose control including healing, decreased risks of chronic conditions, and short and long term complications.  Educated on The Plate Method, CHO's, portion control, CBGs at home fasting and mid afternoon, F/U with PCP every 3 months, bring meter to PCP office, long and short term complications of uncontrolled BG, and importance of exercise.  He has cut back on the amount of foods he eats.  He has not taken Ozempic  for 2 months because it upsets his stomach.  Encouraged him  to talk to Dr. Trixie about switching to Mounjaro  as there may be less side effects and maybe he can tolerate it better.  He wears the Dexcom G6 and monitors his glucose with is phone.  He does have a back up glucometer at home.  He confirms above home DM medications and denies difficulty obtaining them.  He seems very motivated to get his glucose under control.  All questions answered.    Will continue to follow while inpatient.  Thank you, Wyvonna Pinal, MSN, CDCES Diabetes Coordinator Inpatient Diabetes Program [phone] (team pager from 8a-5p)

## 2023-05-25 ENCOUNTER — Encounter: Payer: Self-pay | Admitting: Sports Medicine

## 2023-05-26 ENCOUNTER — Other Ambulatory Visit: Payer: Self-pay | Admitting: Sports Medicine

## 2023-05-26 DIAGNOSIS — M5030 Other cervical disc degeneration, unspecified cervical region: Secondary | ICD-10-CM

## 2023-05-26 DIAGNOSIS — M542 Cervicalgia: Secondary | ICD-10-CM

## 2023-05-26 NOTE — Progress Notes (Signed)
 New referral placed.

## 2023-05-27 ENCOUNTER — Encounter: Payer: Self-pay | Admitting: Internal Medicine

## 2023-05-28 LAB — AEROBIC/ANAEROBIC CULTURE W GRAM STAIN (SURGICAL/DEEP WOUND): Gram Stain: NONE SEEN

## 2023-05-30 ENCOUNTER — Other Ambulatory Visit: Payer: Self-pay | Admitting: Internal Medicine

## 2023-05-30 ENCOUNTER — Ambulatory Visit (INDEPENDENT_AMBULATORY_CARE_PROVIDER_SITE_OTHER): Payer: 59

## 2023-05-30 ENCOUNTER — Encounter: Payer: Self-pay | Admitting: Podiatry

## 2023-05-30 ENCOUNTER — Ambulatory Visit (INDEPENDENT_AMBULATORY_CARE_PROVIDER_SITE_OTHER): Payer: 59 | Admitting: Podiatry

## 2023-05-30 DIAGNOSIS — S98131A Complete traumatic amputation of one right lesser toe, initial encounter: Secondary | ICD-10-CM

## 2023-05-30 MED ORDER — TIRZEPATIDE 2.5 MG/0.5ML ~~LOC~~ SOAJ: 2.5000 mg | SUBCUTANEOUS | 1 refills | Status: DC

## 2023-05-31 NOTE — Progress Notes (Signed)
 Subjective:   Patient ID: Raymond Pugh, male   DOB: 63 y.o.   MRN: [id number]   HPI Patient presents stating that he is doing very well and feeling much better   ROS      Objective:  Physical Exam  Neurovascular status intact negative Toula' sign noted incision site right second toe healing well wound edges well coapted no proximal edema erythema     Assessment:  Doing well post digital amputation digit to right     Plan:  Reviewed condition reapplied sterile dressing continue elevation and reappoint 2 weeks suture removal with strict instructions of any changes or systemic indications of infection to go to the emergency room and contact us  immediately  X-rays indicate no indication of proximal soft tissue gas formation or lysis of metatarsal

## 2023-05-31 NOTE — Progress Notes (Deleted)
 Ben Jackson D[facility] [address] [place] [address] Phone: [phone]   Assessment and Plan:     There are no diagnoses linked to this encounter.  ***   Pertinent previous records reviewed include ***    Follow Up: ***     Subjective:   I, Mikalah Skyles, am serving as a neurosurgeon for Doctor Morene Mace   Chief Complaint: neck pain    HPI:    05/03/2023 Patient is a 63 year old male with neck pain. Patient states he went to place called jump and he went down a slide. This was back in march, he had a form of whiplash. Decreased ROM. Has been using tiger balm and all kinds of rubs none of that worked. Meds dont work for the pain for long periods of time. He isnt able to turn his neck when driving   12/22/7972 Patient states   Relevant Historical Information:   DM type II, Barrett's esophagus  Additional pertinent review of systems negative.   Current Outpatient Medications:    acetaminophen  (TYLENOL ) 325 MG tablet, Take 2 tablets (650 mg total) by mouth every 6 (six) hours as needed for mild pain (pain score 1-3) (or Fever >/= 101)., Disp: , Rfl:    ALPRAZolam  (XANAX ) 0.5 MG tablet, Take 1 tablet (0.5 mg total) by mouth 2 (two) times daily as needed for anxiety., Disp: 20 tablet, Rfl: 0   amoxicillin -clavulanate (AUGMENTIN ) 875-125 MG tablet, Take 1 tablet by mouth 2 (two) times daily., Disp: 14 tablet, Rfl: 0   ascorbic acid  (VITAMIN C ) 500 MG tablet, Take 1 tablet (500 mg total) by mouth daily., Disp: , Rfl:    Continuous Glucose Sensor (DEXCOM G6 SENSOR) MISC, Use as instructed to check blood sugar change every 10 days, Disp: 9 each, Rfl: 3   Continuous Glucose Transmitter (DEXCOM G6 TRANSMITTER) MISC, Change every 90 days, Disp: 1 each, Rfl: 3   gabapentin  (NEURONTIN ) 100 MG capsule, TAKE 1 CAPSULE(100 MG) BY MOUTH TWICE DAILY (Patient taking differently: Take 100 mg by mouth daily as needed.), Disp: 180 capsule, Rfl: 1    hydrochlorothiazide  (HYDRODIURIL ) 25 MG tablet, TAKE 1 TABLET(25 MG) BY MOUTH DAILY, Disp: 90 tablet, Rfl: 1   insulin  glargine (LANTUS ) 100 unit/mL SOPN, Inject 40 Units into the skin daily., Disp: 15 mL, Rfl: 11   insulin  regular (NOVOLIN R RELION) 100 units/mL injection, Inject 0.16-0.2 mLs (16-20 Units total) into the skin 3 (three) times daily before meals. NO FURTHER REFILLS WITHOUT APPOINTMENT, Disp: 20 mL, Rfl: 0   Insulin  Syringe-Needle U-100 (RELION INSULIN  SYRINGE) 31G X 15/64 0.5 ML MISC, Use to inject insulin  3 times a day. NO REFILLS WITHOUT APPOINTMENT, Disp: 300 each, Rfl: 0   Lancets (ONETOUCH ULTRASOFT) lancets, Use to check blood sugar 3 times a day, Disp: 100 each, Rfl: 0   lovastatin  (MEVACOR ) 20 MG tablet, Take 1 tablet (20 mg total) by mouth at bedtime. TAKE 1 TABLET(20 MG) BY MOUTH AT BEDTIME, Disp: 90 tablet, Rfl: 1   meloxicam  (MOBIC ) 15 MG tablet, Take 1 tablet (15 mg total) by mouth daily., Disp: 30 tablet, Rfl: 0   metFORMIN  (GLUCOPHAGE ) 1000 MG tablet, TAKE 1 TABLET BY MOUTH TWICE DAILY, Disp: 180 tablet, Rfl: 3   metoprolol  tartrate (LOPRESSOR ) 100 MG tablet, TAKE 1 TABLET(100 MG) BY MOUTH TWICE DAILY, Disp: 180 tablet, Rfl: 1   omeprazole  (PRILOSEC) 20 MG capsule, TAKE 1 CAPSULE BY MOUTH IN THE MORNING, Disp: 90 capsule,  Rfl: 1   ramipril  (ALTACE ) 10 MG capsule, TAKE 1 CAPSULE(10 MG) BY MOUTH TWICE DAILY, Disp: 180 capsule, Rfl: 1   tirzepatide  (MOUNJARO ) 2.5 MG/0.5ML Pen, Inject 2.5 mg into the skin once a week., Disp: 2 mL, Rfl: 1   traMADol  (ULTRAM ) 50 MG tablet, Take 1 tablet (50 mg total) by mouth every 6 (six) hours as needed for severe pain (pain score 7-10)., Disp: 30 tablet, Rfl: 0   triamcinolone  (KENALOG ) 0.1 % paste, Use as directed 1 Application in the mouth or throat 2 (two) times daily. To tongue., Disp: 5 g, Rfl: 0   zinc  sulfate, 50mg  elemental zinc , 220 (50 Zn) MG capsule, Take 1 capsule (220 mg total) by mouth daily., Disp: , Rfl:    Objective:      There were no vitals filed for this visit.    There is no height or weight on file to calculate BMI.    Physical Exam:    ***   Electronically signed by:  Odis Mace D[facility] 7:34 AM 05/31/23

## 2023-06-01 ENCOUNTER — Ambulatory Visit: Payer: Medicaid Other | Admitting: Sports Medicine

## 2023-06-01 ENCOUNTER — Other Ambulatory Visit: Payer: Self-pay | Admitting: Internal Medicine

## 2023-06-01 MED ORDER — ONDANSETRON HCL 4 MG PO TABS: 4.0000 mg | ORAL_TABLET | Freq: Three times a day (TID) | ORAL | 0 refills | Status: AC | PRN

## 2023-06-03 ENCOUNTER — Ambulatory Visit: Payer: 59 | Admitting: Podiatry

## 2023-06-10 ENCOUNTER — Other Ambulatory Visit: Payer: Self-pay

## 2023-06-10 MED ORDER — INSULIN REGULAR HUMAN 100 UNIT/ML IJ SOLN: 16.0000 [IU] | Freq: Three times a day (TID) | INTRAMUSCULAR | 0 refills | Status: DC

## 2023-06-16 ENCOUNTER — Ambulatory Visit: Payer: Medicaid Other | Admitting: Podiatry

## 2023-06-16 DIAGNOSIS — Z89421 Acquired absence of other right toe(s): Secondary | ICD-10-CM | POA: Diagnosis not present

## 2023-06-16 NOTE — Progress Notes (Signed)
Aleen Sells D[facility] [address] [place] [address] Phone: [phone]   Assessment and Plan:     1. Neck pain 2. DDD (degenerative disc disease), cervical -Chronic with exacerbation, subsequent visit - Overall improvement while taking meloxicam, however patient's pain returned after discontinuing medication.  Still most consistent with flare of mild degenerative changes at C-spine originally flared in March 2024 with intermittent flares since that time leading to reduced ROM - Start Tylenol 500 to 1000 mg tablets 2-3 times a day for day-to-day pain relief - Discontinue meloxicam 15 mg daily.  Start meloxicam 15 mg daily as needed for breakthrough pain.  Recommend limiting chronic NSAIDs to 1-2 doses per week.  Refill provided - Continue HEP and start physical therapy.  Repeat physical therapy referral has been sent.  Patient was not able to establish with PT due to recent toe amputation   Pertinent previous records reviewed include  Hospital note 05/19/2023    Follow Up: 4 to 6 weeks for reevaluation.  Could consider trigger point injections versus discussing OMT versus advanced imaging   Subjective:   I, Moenique Parris, am serving as a Neurosurgeon for Doctor Richardean Sale   Chief Complaint: neck pain    HPI:    05/03/2023 Patient is a 63 year old male with neck pain. Patient states he went to place called jump and he went down a slide. This was back in march, he had a form of whiplash. Decreased ROM. Has been using tiger balm and all kinds of rubs none of that worked. Meds dont work for the pain for long periods of time. He isnt able to turn his neck when driving   4/69/6295 Patient states meloxciam helped , if stops the medicine the pain comes back    Relevant Historical Information:   DM type II, Barrett's esophagus  Additional pertinent review of systems negative.   Current Outpatient Medications:    acetaminophen  (TYLENOL) 325 MG tablet, Take 2 tablets (650 mg total) by mouth every 6 (six) hours as needed for mild pain (pain score 1-3) (or Fever >/= 101)., Disp: , Rfl:    ALPRAZolam (XANAX) 0.5 MG tablet, Take 1 tablet (0.5 mg total) by mouth 2 (two) times daily as needed for anxiety., Disp: 20 tablet, Rfl: 0   amoxicillin (AMOXIL) 500 MG tablet, Take 500 mg by mouth every 6 (six) hours., Disp: , Rfl:    ascorbic acid (VITAMIN C) 500 MG tablet, Take 1 tablet (500 mg total) by mouth daily., Disp: , Rfl:    Continuous Glucose Transmitter (DEXCOM G6 TRANSMITTER) MISC, Change every 90 days, Disp: 1 each, Rfl: 3   gabapentin (NEURONTIN) 100 MG capsule, TAKE 1 CAPSULE(100 MG) BY MOUTH TWICE DAILY (Patient taking differently: Take 100 mg by mouth daily as needed.), Disp: 180 capsule, Rfl: 1   hydrochlorothiazide (HYDRODIURIL) 25 MG tablet, TAKE 1 TABLET(25 MG) BY MOUTH DAILY, Disp: 90 tablet, Rfl: 1   insulin glargine (LANTUS) 100 unit/mL SOPN, Inject 40 Units into the skin daily., Disp: 15 mL, Rfl: 11   insulin regular (NOVOLIN R RELION) 100 units/mL injection, Inject 0.16-0.2 mLs (16-20 Units total) into the skin 3 (three) times daily before meals. NO FURTHER REFILLS WITHOUT APPOINTMENT, Disp: 20 mL, Rfl: 0   Insulin Syringe-Needle U-100 (RELION INSULIN SYRINGE) 31G X 15/64" 0.5 ML MISC, Use to inject insulin 3 times a day. NO REFILLS WITHOUT APPOINTMENT, Disp: 300 each, Rfl: 0   Lancets (ONETOUCH ULTRASOFT) lancets,  Use to check blood sugar 3 times a day, Disp: 100 each, Rfl: 0   lovastatin (MEVACOR) 20 MG tablet, Take 1 tablet (20 mg total) by mouth at bedtime. TAKE 1 TABLET(20 MG) BY MOUTH AT BEDTIME, Disp: 90 tablet, Rfl: 1   meloxicam (MOBIC) 15 MG tablet, Take 1 tablet (15 mg total) by mouth daily., Disp: 30 tablet, Rfl: 0   metFORMIN (GLUCOPHAGE) 1000 MG tablet, TAKE 1 TABLET BY MOUTH TWICE DAILY, Disp: 180 tablet, Rfl: 3   metoprolol tartrate (LOPRESSOR) 100 MG tablet, TAKE 1 TABLET(100 MG) BY MOUTH TWICE  DAILY, Disp: 180 tablet, Rfl: 1   omeprazole (PRILOSEC) 20 MG capsule, TAKE 1 CAPSULE BY MOUTH IN THE MORNING, Disp: 90 capsule, Rfl: 1   ondansetron (ZOFRAN) 4 MG tablet, Take 1 tablet (4 mg total) by mouth every 8 (eight) hours as needed for nausea or vomiting., Disp: 16 tablet, Rfl: 0   ramipril (ALTACE) 10 MG capsule, TAKE 1 CAPSULE(10 MG) BY MOUTH TWICE DAILY, Disp: 180 capsule, Rfl: 1   tirzepatide (MOUNJARO) 2.5 MG/0.5ML Pen, Inject 2.5 mg into the skin once a week., Disp: 2 mL, Rfl: 1   traMADol (ULTRAM) 50 MG tablet, Take 1 tablet (50 mg total) by mouth every 6 (six) hours as needed for severe pain (pain score 7-10)., Disp: 30 tablet, Rfl: 0   triamcinolone (KENALOG) 0.1 % paste, Use as directed 1 Application in the mouth or throat 2 (two) times daily. To tongue., Disp: 5 g, Rfl: 0   zinc sulfate, 50mg  elemental zinc, 220 (50 Zn) MG capsule, Take 1 capsule (220 mg total) by mouth daily., Disp: , Rfl:    Objective:     Vitals:   06/17/23 0842  BP: 132/80  Pulse: 84  SpO2: 99%  Weight: 226 lb (102.5 kg)  Height: 5\' 7"  (1.702 m)      Body mass index is 35.4 kg/m.    Physical Exam:    Neck Exam: Cervical Spine- Posture normal Skin- normal, intact   Neuro:  Strength-   Right Left  Deltoid (C5) 5/5 5/5 Bicep/Brachioradialis (C5/6) 5/5  5/5 Wrist Extension (C6) 5/5 5/5 Tricep (C7) 5/5 5/5 Wrist Flexion (C7) 5/5 5/5 Grip (C8) 5/5 5/5 Finger Abduction (T1) 5/5 5/5   Sensation: intact to light touch in upper extremities bilaterally   Spurling's:  negative bilaterally Neck ROM: Significantly reduced left rotation and sidebending, mildly reduced right rotation, right sidebending, flexion and extension TTP: Right cervical paraspinal, cervical spinous processes, right trapezius NTTP: Left cervical paraspinal, thoracic paraspinal, trapezius       Electronically signed by:  Aleen Sells [facility] 9:00 AM 06/17/23

## 2023-06-16 NOTE — Progress Notes (Signed)
  Subjective:  Patient ID: Raymond Pugh, male    DOB: 1961/03/08,  MRN: [id number]  DOS: 05/23/2023 Procedure: Amputation of right third toe MPJ level  63 y.o. male seen for post op check.  Patient doing well at this time.  Denies any pain.  He does have some phantom sensations where the toe was amputated.  Review of Systems: Negative except as noted in the HPI. Denies N/V/F/Ch.   Objective:  There were no vitals filed for this visit. There is no height or weight on file to calculate BMI. Constitutional Well developed. Well nourished.  Vascular Foot warm and well perfused. Capillary refill normal to all digits.   No calf pain with palpation  Neurologic Normal speech. Oriented to person, place, and time. Epicritic sensation diminished to forefoot  Dermatologic Fully healed amputation site mild eschar present at the amputation site that when peels off there is no wound underlying.  Orthopedic: Status post right third toe amputation at MPJ level   Radiographs: XR 05/30/2023 demonstrate amputation of third toe disarticulated at the MPJ  Pathology: Third toe erosion with inflammation and granulation tissue focal periosteal fibrosis, negative for inflammation at the proximal margin  Micro: Rare Staph aureus  Assessment:   1. S/P amputation of lesser toe, right (HCC)    Plan:  Patient was evaluated and treated and all questions answered.  Postop week 3.5 s/p right third toe amputation MPJ level -Progressing very well. -XR: Deferred at this visit -WB Status: Weightbearing as tolerated in regular shoes -Sutures: Removed in total at this visit. -Medications/ABX: None required no antibiotics indicated -Foot redressed with Band-Aid dressing.  Would continue with bandage dressing for 5-7 more days and then can leave it open - Fine to wash the foot with warm soapy water and gently wash amputation site - Return as needed if any further issues arise with the amputation site         Corinna Gab, DPM [facility] / [facility]

## 2023-06-17 ENCOUNTER — Ambulatory Visit (INDEPENDENT_AMBULATORY_CARE_PROVIDER_SITE_OTHER): Payer: Medicaid Other | Admitting: Sports Medicine

## 2023-06-17 VITALS — BP 132/80 | HR 84 | Ht 67.0 in | Wt 226.0 lb

## 2023-06-17 DIAGNOSIS — M5030 Other cervical disc degeneration, unspecified cervical region: Secondary | ICD-10-CM | POA: Diagnosis not present

## 2023-06-17 DIAGNOSIS — M542 Cervicalgia: Secondary | ICD-10-CM

## 2023-06-17 MED ORDER — MELOXICAM 15 MG PO TABS: 15.0000 mg | ORAL_TABLET | Freq: Every day | ORAL | 0 refills | Status: DC | PRN

## 2023-06-17 NOTE — Patient Instructions (Addendum)
PT [phone] call them to schedule  Tylenol [phone] mg 2-3 times a day for pain relief  Can use remaining meloxicam 15 mg  as needed limiting to 1-2 times per week  4-6 week follow up

## 2023-07-04 ENCOUNTER — Encounter: Payer: Self-pay | Admitting: Internal Medicine

## 2023-07-04 ENCOUNTER — Telehealth: Payer: Self-pay

## 2023-07-04 ENCOUNTER — Other Ambulatory Visit (HOSPITAL_COMMUNITY): Payer: Self-pay

## 2023-07-04 NOTE — Telephone Encounter (Signed)
 Pharmacy Patient Advocate Encounter   Received notification from CoverMyMeds that prior authorization for Mounjaro is required/requested.   Insurance verification completed.   The patient is insured through [facility] .   Per test claim: PA required; PA submitted to above mentioned insurance via CoverMyMeds Key/confirmation #/EOC [id number] Status is pending

## 2023-07-04 NOTE — Telephone Encounter (Signed)
 Pt needs PA for Dexcom G6  (sensors and transmitters) and Novolin R Currently out, needs this expedited.

## 2023-07-04 NOTE — Telephone Encounter (Signed)
 Pharmacy Patient Advocate Encounter   Received notification from Pt Calls Messages that prior authorization for Novolin R is required/requested.   Insurance verification completed.   The patient is insured through [facility] .   Per test claim:  Humulin R  is preferred by the insurance.  If suggested medication is appropriate, Please send in a new RX and discontinue this one. If not, please advise as to why it's not appropriate so that we may request a Prior Authorization. Please note, some preferred medications may still require a PA.  If the suggested medications have not been trialed and there are no contraindications to their use, the PA will not be submitted, as it will not be approved.

## 2023-07-04 NOTE — Telephone Encounter (Signed)
 OK

## 2023-07-04 NOTE — Telephone Encounter (Signed)
 Pharmacy Patient Advocate Encounter   Received notification from Pt Calls Messages that prior authorization for Dexcom G6 sensor is required/requested.   Insurance verification completed.   The patient is insured through [employer] .   Per test claim: PA required; PA submitted to above mentioned insurance via CoverMyMeds Key/confirmation #/EOC [id number] Status is pending

## 2023-07-04 NOTE — Telephone Encounter (Signed)
 Ok to switch to Humulin R ?

## 2023-07-06 ENCOUNTER — Other Ambulatory Visit: Payer: Self-pay | Admitting: Internal Medicine

## 2023-07-08 MED ORDER — INSULIN REGULAR HUMAN 100 UNIT/ML IJ SOLN: 16.0000 [IU] | Freq: Three times a day (TID) | INTRAMUSCULAR | 11 refills | Status: DC

## 2023-07-08 NOTE — Telephone Encounter (Signed)
Requested Prescriptions   Signed Prescriptions Disp Refills   insulin regular (HUMULIN R) 100 units/mL injection 10 mL 11    Sig: Inject 0.16-0.2 mLs (16-20 Units total) into the skin 3 (three) times daily before meals. Inject 0.16-0.2 mLs (16-20 Units total) into the skin 3 (three) times daily before meals.    Authorizing Provider: Carlus Pavlov    Ordering User: Pollie Meyer

## 2023-07-08 NOTE — Addendum Note (Signed)
Addended by: Pollie Meyer on: 07/08/2023 10:25 AM   Modules accepted: Orders

## 2023-07-14 NOTE — Progress Notes (Unsigned)
 Raymond Pugh D[facility] [address] [place] [address] Phone: [phone]   Assessment and Plan:     There are no diagnoses linked to this encounter.  ***   Pertinent previous records reviewed include ***    Follow Up: ***     Subjective:   I, Raymond Pugh, am serving as a Neurosurgeon for Doctor Richardean Sale   Chief Complaint: neck pain    HPI:    05/03/2023 Patient is a 63 year old male with neck pain. Patient states he went to place called jump and he went down a slide. This was back in march, he had a form of whiplash. Decreased ROM. Has been using tiger balm and all kinds of rubs none of that worked. Meds dont work for the pain for long periods of time. He isnt able to turn his neck when driving    5/62/1308 Patient states meloxciam helped , if stops the medicine the pain comes back   07/15/2023 Patient states   Relevant Historical Information:   DM type II, Barrett's esophagus  Additional pertinent review of systems negative.   Current Outpatient Medications:    acetaminophen (TYLENOL) 325 MG tablet, Take 2 tablets (650 mg total) by mouth every 6 (six) hours as needed for mild pain (pain score 1-3) (or Fever >/= 101)., Disp: , Rfl:    ALPRAZolam (XANAX) 0.5 MG tablet, Take 1 tablet (0.5 mg total) by mouth 2 (two) times daily as needed for anxiety., Disp: 20 tablet, Rfl: 0   amoxicillin (AMOXIL) 500 MG tablet, Take 500 mg by mouth every 6 (six) hours., Disp: , Rfl:    ascorbic acid (VITAMIN C) 500 MG tablet, Take 1 tablet (500 mg total) by mouth daily., Disp: , Rfl:    Continuous Glucose Transmitter (DEXCOM G6 TRANSMITTER) MISC, Change every 90 days, Disp: 1 each, Rfl: 3   gabapentin (NEURONTIN) 100 MG capsule, TAKE 1 CAPSULE(100 MG) BY MOUTH TWICE DAILY (Patient taking differently: Take 100 mg by mouth daily as needed.), Disp: 180 capsule, Rfl: 1   hydrochlorothiazide (HYDRODIURIL) 25 MG tablet, TAKE 1 TABLET(25 MG) BY  MOUTH DAILY, Disp: 90 tablet, Rfl: 1   insulin glargine (LANTUS) 100 unit/mL SOPN, Inject 40 Units into the skin daily., Disp: 15 mL, Rfl: 11   insulin regular (HUMULIN R) 100 units/mL injection, Inject 0.16-0.2 mLs (16-20 Units total) into the skin 3 (three) times daily before meals. Inject 0.16-0.2 mLs (16-20 Units total) into the skin 3 (three) times daily before meals., Disp: 10 mL, Rfl: 11   Insulin Syringe-Needle U-100 (RELION INSULIN SYRINGE) 31G X 15/64" 0.5 ML MISC, Use to inject insulin 3 times a day. NO REFILLS WITHOUT APPOINTMENT, Disp: 300 each, Rfl: 0   Lancets (ONETOUCH ULTRASOFT) lancets, Use to check blood sugar 3 times a day, Disp: 100 each, Rfl: 0   lovastatin (MEVACOR) 20 MG tablet, Take 1 tablet (20 mg total) by mouth at bedtime. TAKE 1 TABLET(20 MG) BY MOUTH AT BEDTIME, Disp: 90 tablet, Rfl: 1   meloxicam (MOBIC) 15 MG tablet, Take 1 tablet (15 mg total) by mouth daily as needed for pain., Disp: 30 tablet, Rfl: 0   metFORMIN (GLUCOPHAGE) 1000 MG tablet, TAKE 1 TABLET BY MOUTH TWICE DAILY, Disp: 180 tablet, Rfl: 3   metoprolol tartrate (LOPRESSOR) 100 MG tablet, TAKE 1 TABLET(100 MG) BY MOUTH TWICE DAILY, Disp: 180 tablet, Rfl: 1   omeprazole (PRILOSEC) 20 MG capsule, TAKE 1 CAPSULE BY MOUTH IN THE  MORNING, Disp: 90 capsule, Rfl: 1   ondansetron (ZOFRAN) 4 MG tablet, Take 1 tablet (4 mg total) by mouth every 8 (eight) hours as needed for nausea or vomiting., Disp: 16 tablet, Rfl: 0   ramipril (ALTACE) 10 MG capsule, TAKE 1 CAPSULE(10 MG) BY MOUTH TWICE DAILY, Disp: 180 capsule, Rfl: 1   tirzepatide (MOUNJARO) 2.5 MG/0.5ML Pen, Inject 2.5 mg into the skin once a week., Disp: 2 mL, Rfl: 1   traMADol (ULTRAM) 50 MG tablet, Take 1 tablet (50 mg total) by mouth every 6 (six) hours as needed for severe pain (pain score 7-10)., Disp: 30 tablet, Rfl: 0   triamcinolone (KENALOG) 0.1 % paste, Use as directed 1 Application in the mouth or throat 2 (two) times daily. To tongue., Disp: 5 g,  Rfl: 0   zinc sulfate, 50mg  elemental zinc, 220 (50 Zn) MG capsule, Take 1 capsule (220 mg total) by mouth daily., Disp: , Rfl:    Objective:     There were no vitals filed for this visit.    There is no height or weight on file to calculate BMI.    Physical Exam:    ***   Electronically signed by:  Raymond Pugh [facility] 9:01 AM 07/14/23

## 2023-07-15 ENCOUNTER — Ambulatory Visit: Payer: Medicaid Other | Admitting: Sports Medicine

## 2023-07-15 ENCOUNTER — Ambulatory Visit (INDEPENDENT_AMBULATORY_CARE_PROVIDER_SITE_OTHER): Payer: Medicaid Other

## 2023-07-15 ENCOUNTER — Telehealth: Payer: Self-pay | Admitting: Family Medicine

## 2023-07-15 VITALS — BP 138/86 | HR 122 | Ht 67.0 in | Wt 226.0 lb

## 2023-07-15 DIAGNOSIS — G8929 Other chronic pain: Secondary | ICD-10-CM

## 2023-07-15 DIAGNOSIS — R296 Repeated falls: Secondary | ICD-10-CM | POA: Diagnosis not present

## 2023-07-15 DIAGNOSIS — M25552 Pain in left hip: Secondary | ICD-10-CM | POA: Diagnosis not present

## 2023-07-15 DIAGNOSIS — M5030 Other cervical disc degeneration, unspecified cervical region: Secondary | ICD-10-CM

## 2023-07-15 DIAGNOSIS — M545 Low back pain, unspecified: Secondary | ICD-10-CM

## 2023-07-15 DIAGNOSIS — M542 Cervicalgia: Secondary | ICD-10-CM

## 2023-07-15 NOTE — Telephone Encounter (Signed)
 Copied from CRM [id number]. Topic: Appointments - Transfer of Care >> Jul 15, 2023 12:40 PM [place] wrote: Pt is requesting to transfer FROM: Raymond Pugh Pt is requesting to transfer TO: [name] Reason for requested transfer: Closer to home It is the responsibility of the team the patient would like to transfer to (Dr. Veto Kemps) to reach out to the patient if for any reason this transfer is not acceptable.

## 2023-07-15 NOTE — Patient Instructions (Signed)
 Low back HEP  Tylenol [phone] mg 2-3 times a day for pain relief  Meloxicam for breakthrough pain no more than 1-2 times per week  Recommend using heating pads 3-4 week follow up

## 2023-07-15 NOTE — Telephone Encounter (Signed)
 Pt can schedule a toc with Dr Veto Kemps, [email or web address] and sent mychart message.

## 2023-07-16 ENCOUNTER — Ambulatory Visit: Payer: Medicaid Other | Attending: Sports Medicine

## 2023-07-16 ENCOUNTER — Other Ambulatory Visit: Payer: Self-pay

## 2023-07-16 DIAGNOSIS — M542 Cervicalgia: Secondary | ICD-10-CM | POA: Insufficient documentation

## 2023-07-16 DIAGNOSIS — M5030 Other cervical disc degeneration, unspecified cervical region: Secondary | ICD-10-CM | POA: Diagnosis not present
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
## 2023-07-18 ENCOUNTER — Encounter: Payer: Self-pay | Admitting: Sports Medicine

## 2023-07-18 ENCOUNTER — Other Ambulatory Visit: Payer: Self-pay | Admitting: Sports Medicine

## 2023-07-18 DIAGNOSIS — R296 Repeated falls: Secondary | ICD-10-CM

## 2023-07-18 DIAGNOSIS — G8929 Other chronic pain: Secondary | ICD-10-CM

## 2023-07-18 DIAGNOSIS — M542 Cervicalgia: Secondary | ICD-10-CM

## 2023-07-18 DIAGNOSIS — M5030 Other cervical disc degeneration, unspecified cervical region: Secondary | ICD-10-CM

## 2023-07-18 DIAGNOSIS — M25552 Pain in left hip: Secondary | ICD-10-CM

## 2023-07-18 MED ORDER — DICLOFENAC SODIUM 1 % EX GEL: 2.0000 g | Freq: Two times a day (BID) | CUTANEOUS | Status: AC | PRN

## 2023-07-18 NOTE — Progress Notes (Unsigned)
Diclofenac Sodium

## 2023-07-18 NOTE — Telephone Encounter (Signed)
Scheduled 2/21

## 2023-07-19 ENCOUNTER — Other Ambulatory Visit: Payer: Self-pay | Admitting: Sports Medicine

## 2023-07-19 MED ORDER — TIZANIDINE HCL 4 MG PO TABS
4.0000 mg | ORAL_TABLET | Freq: Every evening | ORAL | 0 refills | Status: AC | PRN
Start: 2023-07-19 — End: ?

## 2023-07-19 NOTE — Progress Notes (Unsigned)
 Tizanidine 4 mg 30 0 placed

## 2023-07-19 NOTE — Telephone Encounter (Signed)
 Called and spoke with patient.

## 2023-07-20 NOTE — Telephone Encounter (Signed)
 Pharmacy Patient Advocate Encounter  Received notification from [facility] that Prior Authorization for Raymond Pugh has been DENIED.  See denial reason below. No denial letter attached in CMM. Will attach denial letter to Media tab once received.    PA #/Case ID/Reference #: [id number]

## 2023-07-20 NOTE — Telephone Encounter (Signed)
 Pharmacy Patient Advocate Encounter  Received notification from [facility] that Prior Authorization for Dexcom G6 sensor has been APPROVED through 07/03/24    PA #/Case ID/Reference #: [id number]

## 2023-07-20 NOTE — Telephone Encounter (Signed)
 J, We can switch to Trulicity 1.5 mg weekly. Ty! C

## 2023-07-22 MED ORDER — TRULICITY 1.5 MG/0.5ML ~~LOC~~ SOAJ: 1.5000 mg | SUBCUTANEOUS | 2 refills | Status: DC

## 2023-07-22 NOTE — Addendum Note (Signed)
 Addended by: Pollie Meyer on: 07/22/2023 04:29 PM   Modules accepted: Orders

## 2023-07-23 ENCOUNTER — Ambulatory Visit: Payer: Medicaid Other | Attending: Sports Medicine

## 2023-07-23 ENCOUNTER — Telehealth: Payer: Self-pay

## 2023-07-23 DIAGNOSIS — M542 Cervicalgia: Secondary | ICD-10-CM | POA: Insufficient documentation

## 2023-07-23 DIAGNOSIS — M5459 Other low back pain: Secondary | ICD-10-CM | POA: Insufficient documentation

## 2023-07-23 NOTE — Telephone Encounter (Signed)
 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 "I had no way of calling you all, but I'm going to save the number right now." Confirmed next scheduled appt - MJ

## 2023-07-27 ENCOUNTER — Ambulatory Visit: Admitting: Physical Therapy

## 2023-07-27 ENCOUNTER — Ambulatory Visit: Payer: Medicaid Other

## 2023-07-27 ENCOUNTER — Encounter: Payer: Self-pay | Admitting: Physical Therapy

## 2023-07-27 DIAGNOSIS — M5459 Other low back pain: Secondary | ICD-10-CM

## 2023-07-27 DIAGNOSIS — M542 Cervicalgia: Secondary | ICD-10-CM
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
## 2023-07-30 ENCOUNTER — Ambulatory Visit: Payer: Medicaid Other | Admitting: Physical Therapy

## 2023-08-02 ENCOUNTER — Ambulatory Visit: Payer: Medicaid Other | Admitting: Physical Therapy

## 2023-08-02 ENCOUNTER — Encounter: Payer: Self-pay | Admitting: Sports Medicine

## 2023-08-02 ENCOUNTER — Encounter: Payer: Self-pay | Admitting: Physical Therapy

## 2023-08-02 DIAGNOSIS — M5459 Other low back pain: Secondary | ICD-10-CM

## 2023-08-02 DIAGNOSIS — M542 Cervicalgia: Secondary | ICD-10-CM
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
## 2023-08-05 ENCOUNTER — Ambulatory Visit (INDEPENDENT_AMBULATORY_CARE_PROVIDER_SITE_OTHER): Payer: Medicaid Other | Admitting: Sports Medicine

## 2023-08-05 VITALS — HR 79 | Ht 67.0 in | Wt 223.0 lb

## 2023-08-05 DIAGNOSIS — M545 Low back pain, unspecified: Secondary | ICD-10-CM | POA: Diagnosis not present

## 2023-08-05 DIAGNOSIS — M51362 Other intervertebral disc degeneration, lumbar region with discogenic back pain and lower extremity pain: Secondary | ICD-10-CM

## 2023-08-05 DIAGNOSIS — R296 Repeated falls: Secondary | ICD-10-CM | POA: Diagnosis not present

## 2023-08-05 DIAGNOSIS — G8929 Other chronic pain: Secondary | ICD-10-CM

## 2023-08-05 MED ORDER — DICLOFENAC SODIUM 1 % EX GEL: 2.0000 g | Freq: Every day | CUTANEOUS | Status: AC | PRN

## 2023-08-05 NOTE — Patient Instructions (Addendum)
 Lumbar MRI  Follow up 5 days after Voltaren gel  Continue HEP and PT

## 2023-08-06 ENCOUNTER — Ambulatory Visit: Payer: Medicaid Other

## 2023-08-06 ENCOUNTER — Ambulatory Visit: Payer: Self-pay

## 2023-08-06 DIAGNOSIS — M542 Cervicalgia: Secondary | ICD-10-CM

## 2023-08-06 DIAGNOSIS — M5459 Other low back pain: Secondary | ICD-10-CM
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
## 2023-08-10 ENCOUNTER — Ambulatory Visit: Payer: Medicaid Other

## 2023-08-11 ENCOUNTER — Ambulatory Visit

## 2023-08-13 ENCOUNTER — Ambulatory Visit: Admitting: Physical Therapy

## 2023-08-13 ENCOUNTER — Encounter: Payer: Self-pay | Admitting: Physical Therapy

## 2023-08-13 DIAGNOSIS — M542 Cervicalgia: Secondary | ICD-10-CM | POA: Diagnosis not present

## 2023-08-13 DIAGNOSIS — M5459 Other low back pain: Secondary | ICD-10-CM

## 2023-08-13 NOTE — Patient Instructions (Signed)
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
## 2023-08-14 ENCOUNTER — Other Ambulatory Visit: Payer: Self-pay | Admitting: Sports Medicine

## 2023-08-15 ENCOUNTER — Ambulatory Visit (INDEPENDENT_AMBULATORY_CARE_PROVIDER_SITE_OTHER)

## 2023-08-15 DIAGNOSIS — G8929 Other chronic pain: Secondary | ICD-10-CM | POA: Diagnosis not present

## 2023-08-15 DIAGNOSIS — M545 Low back pain, unspecified: Secondary | ICD-10-CM | POA: Diagnosis not present

## 2023-08-16 ENCOUNTER — Encounter: Payer: Medicaid Other | Admitting: Physical Therapy

## 2023-08-17 ENCOUNTER — Ambulatory Visit: Admitting: Sports Medicine

## 2023-08-17 VITALS — BP 142/70 | HR 81 | Ht 67.0 in

## 2023-08-17 DIAGNOSIS — G8929 Other chronic pain: Secondary | ICD-10-CM | POA: Diagnosis not present

## 2023-08-17 DIAGNOSIS — R296 Repeated falls: Secondary | ICD-10-CM

## 2023-08-17 DIAGNOSIS — M51362 Other intervertebral disc degeneration, lumbar region with discogenic back pain and lower extremity pain: Secondary | ICD-10-CM | POA: Diagnosis not present

## 2023-08-17 DIAGNOSIS — M545 Low back pain, unspecified: Secondary | ICD-10-CM

## 2023-08-17 MED ORDER — CYCLOBENZAPRINE HCL 5 MG PO TABS: 5.0000 mg | ORAL_TABLET | Freq: Every day | ORAL | 0 refills | Status: DC

## 2023-08-17 MED ORDER — KETOROLAC TROMETHAMINE 60 MG/2ML IM SOLN
60.0000 mg | Freq: Once | INTRAMUSCULAR | Status: AC
  Administered 2023-08-17: 60 mg via INTRAMUSCULAR

## 2023-08-17 MED ORDER — DICLOFENAC SODIUM 1 % EX GEL: 2.0000 g | Freq: Four times a day (QID) | CUTANEOUS | 5 refills | Status: DC

## 2023-08-17 MED ORDER — METHYLPREDNISOLONE ACETATE 80 MG/ML IJ SUSP
80.0000 mg | Freq: Once | INTRAMUSCULAR | Status: AC
  Administered 2023-08-17: 80 mg via INTRAMUSCULAR

## 2023-08-17 NOTE — Addendum Note (Signed)
 Addended by: Rolland Bimler D on: 08/17/2023 04:40 PM   Modules accepted: Orders
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
## 2023-08-17 NOTE — Patient Instructions (Addendum)
 Epidural left sided L4/L5 Flexerell 5 to 10 mg nightly as needed for muscle spasms. Do not take this the same time as tizanadine. - Start Tylenol 500 to 1000 mg tablets 2-3 times a day for day-to-day pain relief Ibu 400 mg every 6 hours. Follow up 2 weeks after injection.

## 2023-08-18 ENCOUNTER — Encounter: Payer: Self-pay | Admitting: Sports Medicine

## 2023-08-19 NOTE — Discharge Instructions (Signed)

## 2023-08-20 ENCOUNTER — Ambulatory Visit: Payer: Self-pay

## 2023-08-22 ENCOUNTER — Other Ambulatory Visit: Payer: Self-pay

## 2023-08-22 ENCOUNTER — Ambulatory Visit: Admitting: Sports Medicine

## 2023-08-22 ENCOUNTER — Telehealth: Payer: Self-pay | Admitting: Sports Medicine

## 2023-08-22 ENCOUNTER — Emergency Department (HOSPITAL_COMMUNITY)
Admission: EM | Admit: 2023-08-22 | Discharge: 2023-08-22 | Disposition: A | Discharge status: Home / Self Care | Attending: Emergency Medicine | Admitting: Emergency Medicine

## 2023-08-22 ENCOUNTER — Inpatient Hospital Stay
Admission: RE | Admit: 2023-08-22 | Discharge: 2023-08-22 | Disposition: A | Discharge status: Home / Self Care | Source: Ambulatory Visit | Attending: Sports Medicine | Admitting: Sports Medicine

## 2023-08-22 ENCOUNTER — Encounter (HOSPITAL_COMMUNITY): Payer: Self-pay

## 2023-08-22 ENCOUNTER — Other Ambulatory Visit: Payer: Self-pay | Admitting: Sports Medicine

## 2023-08-22 DIAGNOSIS — M5442 Lumbago with sciatica, left side: Secondary | ICD-10-CM | POA: Insufficient documentation

## 2023-08-22 DIAGNOSIS — M51362 Other intervertebral disc degeneration, lumbar region with discogenic back pain and lower extremity pain: Secondary | ICD-10-CM

## 2023-08-22 DIAGNOSIS — Z7984 Long term (current) use of oral hypoglycemic drugs: Secondary | ICD-10-CM | POA: Diagnosis not present

## 2023-08-22 DIAGNOSIS — Z794 Long term (current) use of insulin: Secondary | ICD-10-CM | POA: Diagnosis not present

## 2023-08-22 DIAGNOSIS — E119 Type 2 diabetes mellitus without complications: Secondary | ICD-10-CM | POA: Diagnosis not present

## 2023-08-22 DIAGNOSIS — G8929 Other chronic pain: Secondary | ICD-10-CM

## 2023-08-22 DIAGNOSIS — R296 Repeated falls: Secondary | ICD-10-CM

## 2023-08-22 DIAGNOSIS — M545 Low back pain, unspecified: Secondary | ICD-10-CM

## 2023-08-22 MED ORDER — OXYCODONE-ACETAMINOPHEN 5-325 MG PO TABS
1.0000 | ORAL_TABLET | Freq: Once | ORAL | Status: AC
  Administered 2023-08-22: 1 via ORAL
  Filled 2023-08-22: qty 1

## 2023-08-22 MED ORDER — TRAMADOL HCL 50 MG PO TABS: 50.0000 mg | ORAL_TABLET | Freq: Four times a day (QID) | ORAL | 0 refills | Status: AC | PRN

## 2023-08-22 NOTE — Telephone Encounter (Signed)
 Patient's wife called back and spoke to [facility].

## 2023-08-22 NOTE — Telephone Encounter (Signed)
 Can you send this to the patient in MyChart as well?

## 2023-08-22 NOTE — Progress Notes (Signed)
 We sent in a prescription of Flexeril 5 mg tablets that can be used 5 to 10 mg nightly as needed for muscle spasms, 40 tablets, 0 refills.  This was sent in 5 days ago on 08/22/2023.  Patient should have plenty of this medication left and I will not provide a refill at this time.  We will provide short course of tramadol 50 mg every 6 hours as needed to use for acute flare of back pain.  Will not provide long-term tramadol prescription.  Current pain medication plan should consist of the following: -Continue Tylenol 500 to 1000 mg tablets 2-3 times a day for day-to-day pain relief  -Continue ibuprofen 400 mg every 6 hours as needed for breakthrough pain - Continue Flexeril 5 to 10 mg nightly as needed for muscle spasms - Start tramadol 50 mg every 6 hours as needed for severe breakthrough pain  Per my previous office note, recommend proceeding with epidural CSI.  Patient was seen in the emergency department today, 08/22/2023, for uncontrolled pain.  They elected to not refill chronic pain medication, and I agree with their decision as this should be within the scope of outpatient treatment, is not the appropriate use of our emergency department, and we already have an outpatient treatment plan in place.  I agree with emergency departments workup and plan.
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
## 2023-08-22 NOTE — ED Provider Notes (Cosign Needed Addendum)
 [place] EMERGENCY DEPARTMENT AT [facility] Provider Note   CSN: [id number] Arrival date & time: 08/22/23  4034     History  Chief Complaint  Patient presents with   Back Pain    Raymond Pugh is a 63 y.o. male history of uncontrolled type 2 diabetes, L4-L5 disc herniation has been chronic for the past few months presented for his chronic low back pain.  Patient sees sports medicine for his back pain and was post to have an epidural injection today however unfortunately there was a billing error in which the code was incorrectly placed and now that the wait 14 days.  Patient be able to walk without issue and denies saddle anesthesia urinary or bowel incontinence or fevers.  Patient did receive Toradol along with steroids 2 days prior by sports medicine which did seem to help.  Patient is here wondering if we can do an epidural injection.  Patient does have tramadol at home.  Patient denies any new traumas.    Home Medications Prior to Admission medications   Medication Sig Start Date End Date Taking? Authorizing Provider  acetaminophen (TYLENOL) 325 MG tablet Take 2 tablets (650 mg total) by mouth every 6 (six) hours as needed for mild pain (pain score 1-3) (or Fever >/= 101). 05/24/23   Alwyn Ren, MD  ALPRAZolam Prudy Feeler) 0.5 MG tablet Take 1 tablet (0.5 mg total) by mouth 2 (two) times daily as needed for anxiety. 04/05/23   Shade Flood, MD  amoxicillin (AMOXIL) 500 MG tablet Take 500 mg by mouth every 6 (six) hours. 06/09/23   [provider]  ascorbic acid (VITAMIN C) 500 MG tablet Take 1 tablet (500 mg total) by mouth daily. 05/25/23   Alwyn Ren, MD  Continuous Glucose Transmitter (DEXCOM G6 TRANSMITTER) MISC Change every 90 days 10/05/22   Carlus Pavlov, MD  cyclobenzaprine (FLEXERIL) 5 MG tablet Take 1 tablet (5 mg total) by mouth at bedtime. 08/17/23   Richardean Sale, DO  diclofenac Sodium (VOLTAREN) 1 % GEL Apply 2 g topically 4  (four) times daily. To affected joint. 08/17/23   Richardean Sale, DO  Dulaglutide (TRULICITY) 1.5 MG/0.5ML SOAJ Inject 1.5 mg into the skin once a week. 07/22/23   Carlus Pavlov, MD  gabapentin (NEURONTIN) 100 MG capsule TAKE 1 CAPSULE(100 MG) BY MOUTH TWICE DAILY Patient taking differently: Take 100 mg by mouth daily as needed. 04/05/23   Shade Flood, MD  hydrochlorothiazide (HYDRODIURIL) 25 MG tablet TAKE 1 TABLET(25 MG) BY MOUTH DAILY 04/05/23   Shade Flood, MD  insulin glargine (LANTUS) 100 unit/mL SOPN Inject 40 Units into the skin daily. 05/24/23   Alwyn Ren, MD  insulin regular (HUMULIN R) 100 units/mL injection Inject 0.16-0.2 mLs (16-20 Units total) into the skin 3 (three) times daily before meals. Inject 0.16-0.2 mLs (16-20 Units total) into the skin 3 (three) times daily before meals. 07/08/23   Carlus Pavlov, MD  Insulin Syringe-Needle U-100 (RELION INSULIN SYRINGE) 31G X 15/64" 0.5 ML MISC Use to inject insulin 3 times a day. NO REFILLS WITHOUT APPOINTMENT 03/29/19   Carlus Pavlov, MD  Lancets [facility] ULTRASOFT) lancets Use to check blood sugar 3 times a day 07/29/20   Carlus Pavlov, MD  lovastatin (MEVACOR) 20 MG tablet Take 1 tablet (20 mg total) by mouth at bedtime. TAKE 1 TABLET(20 MG) BY MOUTH AT BEDTIME 04/05/23   Shade Flood, MD  meloxicam (MOBIC) 15 MG tablet Take 1 tablet (15 mg  total) by mouth daily as needed for pain. 06/17/23   Richardean Sale, DO  metFORMIN (GLUCOPHAGE) 1000 MG tablet TAKE 1 TABLET BY MOUTH TWICE DAILY 09/28/22   Carlus Pavlov, MD  metoprolol tartrate (LOPRESSOR) 100 MG tablet TAKE 1 TABLET(100 MG) BY MOUTH TWICE DAILY 04/05/23   Shade Flood, MD  omeprazole (PRILOSEC) 20 MG capsule TAKE 1 CAPSULE BY MOUTH IN THE MORNING 12/07/22   Iva Boop, MD  ondansetron (ZOFRAN) 4 MG tablet Take 1 tablet (4 mg total) by mouth every 8 (eight) hours as needed for nausea or vomiting. 06/01/23   Carlus Pavlov, MD   ramipril (ALTACE) 10 MG capsule TAKE 1 CAPSULE(10 MG) BY MOUTH TWICE DAILY 04/05/23   Shade Flood, MD  tiZANidine (ZANAFLEX) 4 MG tablet Take 1 tablet (4 mg total) by mouth at bedtime as needed. 07/19/23   Richardean Sale, DO  traMADol (ULTRAM) 50 MG tablet Take 1 tablet (50 mg total) by mouth every 6 (six) hours as needed for severe pain (pain score 7-10). 05/24/23   Alwyn Ren, MD  triamcinolone (KENALOG) 0.1 % paste Use as directed 1 Application in the mouth or throat 2 (two) times daily. To tongue. 12/18/21   Shade Flood, MD  zinc sulfate, 50mg  elemental zinc, 220 (50 Zn) MG capsule Take 1 capsule (220 mg total) by mouth daily. 05/25/23   Alwyn Ren, MD      Allergies    Hydrocodone and Yellow dyes (non-tartrazine)    Review of Systems   Review of Systems  Musculoskeletal:  Positive for back pain.    Physical Exam Updated Vital Signs BP (!) 132/103 (BP Location: Left Arm)   Pulse 87   Temp 97.6 F (36.4 C)   Resp 18   Ht 5' 10.5" (1.791 m)   Wt 98.9 kg   SpO2 98%   BMI 30.84 kg/m  Physical Exam Constitutional:      Comments: Uncomfortable  Cardiovascular:     Rate and Rhythm: Normal rate.     Pulses: Normal pulses.  Abdominal:     Palpations: Abdomen is soft.     Tenderness: There is no abdominal tenderness. There is no guarding or rebound.  Musculoskeletal:     Comments: Midline and paralumbar musculature tenderness without bony abnormalities Positive straight leg test on the left side Able to bear weight 5 out of 5 bilateral hip flexion No midline tenderness or abnormalities palpated  Skin:    General: Skin is warm and dry.     Capillary Refill: Capillary refill takes less than 2 seconds.  Neurological:     Mental Status: He is alert.     Comments: Sensation intact distally Able to ambulate without difficulty does endorse pain when ambulating  Psychiatric:        Mood and Affect: Mood normal.     ED Results / Procedures /  Treatments   Labs (all labs ordered are listed, but only abnormal results are displayed) Labs Reviewed - No data to display  EKG None  Radiology No results found.  Procedures Procedures    Medications Ordered in ED Medications  oxyCODONE-acetaminophen (PERCOCET/ROXICET) 5-325 MG per tablet 1 tablet (1 tablet Oral Given 08/22/23 1058)    ED Course/ Medical Decision Making/ A&P                                 Medical Decision Making Risk Prescription drug management.  Rafal Archuleta Colleran 63 y.o. presented today for back pain. Working DDx that I considered at this time includes, but not limited to, herniation, MSK, underlying fracture, epidural hematoma/abscess, cauda equina syndrome, spinal stenosis, spinal malignancy, discitis, spinal infection, spondylitises/ spondylosis, conus medullaris, DDD of the back.  R/o DDx: MSK, underlying fracture, epidural hematoma/abscess, cauda equina syndrome, spinal stenosis, spinal malignancy, discitis, spinal infection, spondylitises/ spondylosis, conus medullaris : less likely due to history of present illness, physical exam, labs/imaging findings.  Review of prior external notes: 07/20/2023 office visit  Unique Tests and My Interpretation: None  Social Determinants of Health: none  Discussion with Independent Historian:  Wife  Discussion of Management of Tests: None  Risk: Medium: prescription drug management  Risk Stratification Score: none  Plan: On exam patient was uncomfortable on exam but is stable vitals. No neurological deficits and normal neuro exam.  Patient can walk but states is painful.  No loss of bowel or bladder control.  No concern for cauda equina.  No fever, night sweats, weight loss, h/o cancer, IVDU.  Patient did have MRI about a week ago that does show disc herniation which I suspect is causing his symptoms.  Patient currently sees sports medicine and was post to have an epidural injection today however  unfortunately someone put in a procedure code wrong and now they have the wait 14 days.  There have been no new changes in patient's back pain and that it did get better slightly with the steroid and Toradol injection a few days ago.  I do suspect this is patient's chronic back pain and have low suspicion of any life-threatening pathology.  Will give 1 dose of Percocet here and recommend they follow-up with sports medicine but will also give a neurosurgeon from follow-up with his they do not see one for his back.  Patient states that they do have tramadol at home so we will refrain from prescribing any pain meds as patient has these at home and it is against [facility] policy to prescribe opiates for chronic back pain.  Patient does have Voltaren gel at home along with muscle relaxers.  Patient's wife did ask about [place] doing an epidural injection however I informed her and her husband that that is outside the scope of the emergency department and that they will need to follow-up for an epidural injection for chronic back pain.  Patient was given return precautions. Patient stable for discharge at this time.  Patient verbalized understanding of plan.  This chart was dictated using voice recognition software.  Despite best efforts to proofread,  errors can occur which can change the documentation meaning.        Final Clinical Impression(s) / ED Diagnoses Final diagnoses:  Chronic midline low back pain with left-sided sciatica    Rx / DC Orders ED Discharge Orders     None         Remi Deter 08/22/23 1107    Netta Corrigan, PA-C 08/22/23 1107    Arby Barrette, MD 08/23/23 [phone]

## 2023-08-22 NOTE — Telephone Encounter (Signed)
 I received a call from patient's wife. She said that they went to have his epidural this morning and were told that the appointment has to be canceled because the authorization was not done. They told them that the person that does them is not in the office and that they should contact us?  She would like to know what to do from here?  Patient is still in a lot of pain.   [phone] (Wife - Misty Stanley)

## 2023-08-22 NOTE — Telephone Encounter (Signed)
 Patients wife called stating that [facility] had to cancel epidural. She was upset that the order wast put in stat, the order was put in stat. She was notified that we have nothing to do with [facility] scheduling. She was upset. They are currently in the ED and she is requesting we send in Toradol and flexeril. I told her would ask for a refill for her but other than that I don't know what she wants from [place].

## 2023-08-22 NOTE — ED Notes (Signed)
 Pt does not want pain meds in triage, requests to wait till he gets a room.

## 2023-08-22 NOTE — Telephone Encounter (Signed)
 Dr. Jean Rosenthal recommendation sent via [facility]

## 2023-08-22 NOTE — Telephone Encounter (Signed)
 Patient's wife called back, they are currently going into the ER.

## 2023-08-22 NOTE — Telephone Encounter (Signed)
 Noted, probably best for patient with his amount of pain

## 2023-08-22 NOTE — Discharge Instructions (Signed)
 Please follow-up with your sports medicine specialist or the neurosurgeon I have attached here for you today in regards to recent symptoms and ER visit.  As we discussed your MRI does show disc herniation on the left side at the L4-L5 region which is causing your symptoms.  Please continue your medications as prescribed at home and has agreed I will give you a neurosurgeon to follow-up with if you are unable to follow-up with your sports medicine specialist.  Please return to the ER if your pain becomes excruciating or you start developing new numbness, weakness, urinary incontinence (peeing on self without warning), urinary retention (not able to pee despite bladder feeling full), inability to defecate, pins and needles sensation by your ano-genital area.

## 2023-08-22 NOTE — ED Notes (Signed)
 Patient verbalized understanding of discharge instructions stated he will be taking the medications at home as prescribed and will follow up with neuro. Patient does not have any other concerns at this time, Patient was wheel chaired out to ED lobby.

## 2023-08-23 ENCOUNTER — Other Ambulatory Visit: Payer: Self-pay | Admitting: Sports Medicine

## 2023-08-23 ENCOUNTER — Encounter: Payer: Medicaid Other | Admitting: Physical Therapy

## 2023-08-23 NOTE — Telephone Encounter (Signed)
 Joni Reining from [facility] contacted insurance. Will upload ED visit notes from yesterday and stated they will expedite request for a response with 72 hours.

## 2023-08-23 NOTE — Telephone Encounter (Signed)
 Spoke with pt wife, informed again that [facility] handles the auth for this service. Informed her I would reach out to [facility] for an update and call back. Left VM for [facility] and sent Secure Chat to obtain status.

## 2023-08-24 ENCOUNTER — Ambulatory Visit: Payer: Self-pay | Admitting: Physical Therapy

## 2023-08-25 ENCOUNTER — Encounter: Payer: Self-pay | Admitting: Family Medicine

## 2023-08-25 ENCOUNTER — Ambulatory Visit (INDEPENDENT_AMBULATORY_CARE_PROVIDER_SITE_OTHER): Payer: Medicaid Other | Admitting: Family Medicine

## 2023-08-25 ENCOUNTER — Telehealth: Payer: Self-pay | Admitting: Internal Medicine

## 2023-08-25 VITALS — BP 138/82 | HR 72 | Temp 98.0°F | Ht 70.5 in | Wt 217.8 lb

## 2023-08-25 DIAGNOSIS — Z7985 Long-term (current) use of injectable non-insulin antidiabetic drugs: Secondary | ICD-10-CM

## 2023-08-25 DIAGNOSIS — M5136 Other intervertebral disc degeneration, lumbar region with discogenic back pain only: Secondary | ICD-10-CM | POA: Insufficient documentation

## 2023-08-25 DIAGNOSIS — I10 Essential (primary) hypertension: Secondary | ICD-10-CM

## 2023-08-25 DIAGNOSIS — Z7984 Long term (current) use of oral hypoglycemic drugs: Secondary | ICD-10-CM

## 2023-08-25 DIAGNOSIS — R911 Solitary pulmonary nodule: Secondary | ICD-10-CM

## 2023-08-25 DIAGNOSIS — E782 Mixed hyperlipidemia: Secondary | ICD-10-CM

## 2023-08-25 DIAGNOSIS — K2270 Barrett's esophagus without dysplasia: Secondary | ICD-10-CM | POA: Diagnosis not present

## 2023-08-25 DIAGNOSIS — J302 Other seasonal allergic rhinitis: Secondary | ICD-10-CM | POA: Insufficient documentation

## 2023-08-25 DIAGNOSIS — E1142 Type 2 diabetes mellitus with diabetic polyneuropathy: Secondary | ICD-10-CM | POA: Diagnosis not present

## 2023-08-25 DIAGNOSIS — E1165 Type 2 diabetes mellitus with hyperglycemia: Secondary | ICD-10-CM

## 2023-08-25 DIAGNOSIS — Z794 Long term (current) use of insulin: Secondary | ICD-10-CM

## 2023-08-25 DIAGNOSIS — B353 Tinea pedis: Secondary | ICD-10-CM

## 2023-08-25 DIAGNOSIS — Z89421 Acquired absence of other right toe(s): Secondary | ICD-10-CM | POA: Insufficient documentation

## 2023-08-25 MED ORDER — OMEPRAZOLE 20 MG PO CPDR: DELAYED_RELEASE_CAPSULE | ORAL | 3 refills | Status: AC

## 2023-08-25 NOTE — Progress Notes (Signed)
 [facility]IMARY CARE [facility] [address] [place] [address] Dept: [phone] Dept Fax: [phone]  Transfer of Care Office Visit  Subjective:    Patient ID: Raymond Pugh, male    DOB: Oct 23, 1960, 63 y.o..   MRN: [id number]  Chief Complaint  Patient presents with   Establish Care    [facility]- establish care.   No concerns.     History of Present Illness:  Patient is in today to establish care. Raymond Pugh was born in [place]. He attended [employer] in music, but did not complete a degree. he attended [employer] and received associates degrees in Therapist, sports, Designer, fashion/clothing, and alternative power. 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 Mr. Hallahan is married for the 2nd time, now for 23 years. He has two children, twin step children, and three grandchildren. He denies tobacco or drug use. He occasionally drinks alcohol.  Raymond Pugh has a history of Type 2 diabetes. He is followed by Dr. Elvera Lennox. His diabetes has bee complicated by peripheral neuropathy and he had a right 2nd toe amputation in December. He is currently managed on tirzepatide [facility]) 2.5 mg weekly, metformin 1,000 mg bid, insulin glargine (Lantus) 16 units at bedtime, and Novulin R on a sliding scale.  Raymond Pugh has a history of hypertension. He is managed on HCTZ 25 mg daily, metoprolol tartrate 100 mg bid, and ramipril 10 mg bid.  Raymond Pugh has hyperlipidemia. He is managed on lovastatin 20 mg daily.  Raymond Pugh is currently having significant low back pain . He is seeing Dr. Jean Rosenthal (sports medicine). They have plans for an ESI next week. He is also taking meloxicam 15 mg daily and tramadol 50 mg as needed.  Raymond Pugh has a history of a left upper lung nodule. He had biopsy of this in 2023. Dr. Tonia Brooms recommended a noncontrast CT scan in 6 months, which has never been done.  Past Medical History: Patient Active Problem List   Diagnosis  Date Noted   History of amputation of right second toe (HCC) 08/25/2023   Diabetic peripheral neuropathy (HCC) 08/25/2023   Seasonal allergic rhinitis 08/25/2023   Degeneration of intervertebral disc of lumbar region with discogenic back pain 08/25/2023   Type 2 diabetes mellitus with peripheral neuropathy (HCC) 05/20/2023   Nodule of upper lobe of left lung 05/11/2021   Overweight (BMI 25.0-29.9) 05/29/2019   Liver abscess 06/24/2014   Class 1 obesity due to excess calories with serious comorbidity and body mass index (BMI) of 30.0 to 30.9 in adult 08/25/2010   Barrett's esophagus 07/03/2010   Iron deficiency anemia, unspecified 11/16/2009   Hyperlipidemia 11/04/2008   Attention deficit hyperactivity disorder (ADHD) 08/14/2007   Essential hypertension 11/03/2006   Past Surgical History:  Procedure Laterality Date   ABSCESS DRAIN LIVER PERC ([facility] HX)     klebsiella on liver pa states    AMPUTATION TOE Right 05/23/2023   Procedure: AMPUTATION TOE;  Surgeon: Pilar Plate, DPM;  Location: [facility] OR;  Service: Orthopedics/Podiatry;  Laterality: Right;  Right 2nd toe amputation at MPJ level   BRONCHIAL BIOPSY  06/09/2021   Procedure: BRONCHIAL BIOPSIES;  Surgeon: Josephine Igo, DO;  Location: [facility] ENDOSCOPY;  Service: Pulmonary;;   BRONCHIAL NEEDLE ASPIRATION BIOPSY  06/09/2021   Procedure: BRONCHIAL NEEDLE ASPIRATION BIOPSIES;  Surgeon: Josephine Igo, DO;  Location: [facility] ENDOSCOPY;  Service: Pulmonary;;   BRONCHIAL WASHINGS  06/09/2021   Procedure: BRONCHIAL WASHINGS;  Surgeon: Audie Box  L, DO;  Location: [facility] ENDOSCOPY;  Service: Pulmonary;;   CARDIAC CATHETERIZATION     CATARACT EXTRACTION W/PHACO Left 03/09/2022   Procedure: CATARACT EXTRACTION PHACO AND INTRAOCULAR LENS PLACEMENT (IOC) LEFT DIABETIC 5.16 00:36.6;  Surgeon: Galen Manila, MD;  Location: [facility];  Service: Ophthalmology;  Laterality: Left;  Diabetic   HYDROCELE EXCISION / REPAIR     RADIAL  KERATOTOMY     RETINAL DETACHMENT SURGERY     VIDEO BRONCHOSCOPY WITH RADIAL ENDOBRONCHIAL ULTRASOUND  06/09/2021   Procedure: VIDEO BRONCHOSCOPY WITH RADIAL ENDOBRONCHIAL ULTRASOUND;  Surgeon: Josephine Igo, DO;  Location: [facility] ENDOSCOPY;  Service: Pulmonary;;   Family History  Problem Relation Age of Onset   Hypertension Mother    Asthma Mother    Cancer Father        Bone   Diabetes Father    Hypertension Father    Colon polyps Father    Diabetes Paternal Grandfather    Prostate cancer Neg Hx    Outpatient Medications Prior to Visit  Medication Sig Dispense Refill   acetaminophen (TYLENOL) 325 MG tablet Take 2 tablets (650 mg total) by mouth every 6 (six) hours as needed for mild pain (pain score 1-3) (or Fever >/= 101).     ALPRAZolam (XANAX) 0.5 MG tablet Take 1 tablet (0.5 mg total) by mouth 2 (two) times daily as needed for anxiety. 20 tablet 0   ascorbic acid (VITAMIN C) 500 MG tablet Take 1 tablet (500 mg total) by mouth daily.     Continuous Glucose Transmitter (DEXCOM G6 TRANSMITTER) MISC Change every 90 days 1 each 3   cyclobenzaprine (FLEXERIL) 5 MG tablet TAKE 1 TABLET BY MOUTH EVERYDAY AT BEDTIME 40 tablet 0   diclofenac Sodium (VOLTAREN) 1 % GEL Apply 2 g topically 4 (four) times daily. To affected joint. 100 g 5   gabapentin (NEURONTIN) 100 MG capsule TAKE 1 CAPSULE(100 MG) BY MOUTH TWICE DAILY (Patient taking differently: Take 100 mg by mouth daily as needed.) 180 capsule 1   hydrochlorothiazide (HYDRODIURIL) 25 MG tablet TAKE 1 TABLET(25 MG) BY MOUTH DAILY 90 tablet 1   insulin glargine (LANTUS) 100 unit/mL SOPN Inject 40 Units into the skin daily. 15 mL 11   insulin regular (HUMULIN R) 100 units/mL injection Inject 0.16-0.2 mLs (16-20 Units total) into the skin 3 (three) times daily before meals. Inject 0.16-0.2 mLs (16-20 Units total) into the skin 3 (three) times daily before meals. 10 mL 11   Insulin Syringe-Needle U-100 (RELION INSULIN SYRINGE) 31G X 15/64" 0.5  ML MISC Use to inject insulin 3 times a day. NO REFILLS WITHOUT APPOINTMENT 300 each 0   Lancets (ONETOUCH ULTRASOFT) lancets Use to check blood sugar 3 times a day 100 each 0   lovastatin (MEVACOR) 20 MG tablet Take 1 tablet (20 mg total) by mouth at bedtime. TAKE 1 TABLET(20 MG) BY MOUTH AT BEDTIME 90 tablet 1   meloxicam (MOBIC) 15 MG tablet Take 1 tablet (15 mg total) by mouth daily as needed for pain. 30 tablet 0   metFORMIN (GLUCOPHAGE) 1000 MG tablet TAKE 1 TABLET BY MOUTH TWICE DAILY 180 tablet 3   metoprolol tartrate (LOPRESSOR) 100 MG tablet TAKE 1 TABLET(100 MG) BY MOUTH TWICE DAILY 180 tablet 1   omeprazole (PRILOSEC) 20 MG capsule TAKE 1 CAPSULE BY MOUTH IN THE MORNING 90 capsule 1   ondansetron (ZOFRAN) 4 MG tablet Take 1 tablet (4 mg total) by mouth every 8 (eight) hours as needed for nausea or  vomiting. 16 tablet 0   ramipril (ALTACE) 10 MG capsule TAKE 1 CAPSULE(10 MG) BY MOUTH TWICE DAILY 180 capsule 1   tirzepatide (MOUNJARO) 2.5 MG/0.5ML Pen Inject 2.5 mg into the skin once a week.     tiZANidine (ZANAFLEX) 4 MG tablet Take 1 tablet (4 mg total) by mouth at bedtime as needed. 30 tablet 0   traMADol (ULTRAM) 50 MG tablet Take 1 tablet (50 mg total) by mouth every 6 (six) hours as needed for up to 5 days for severe pain (pain score 7-10). 20 tablet 0   zinc sulfate, 50mg  elemental zinc, 220 (50 Zn) MG capsule Take 1 capsule (220 mg total) by mouth daily.     amoxicillin (AMOXIL) 500 MG tablet Take 500 mg by mouth every 6 (six) hours.     Dulaglutide (TRULICITY) 1.5 MG/0.5ML SOAJ Inject 1.5 mg into the skin once a week. (Patient not taking: Reported on 08/25/2023) 6 mL 2   triamcinolone (KENALOG) 0.1 % paste Use as directed 1 Application in the mouth or throat 2 (two) times daily. To tongue. 5 g 0   Facility-Administered Medications Prior to Visit  Medication Dose Route Frequency Provider Last Rate Last Admin   diclofenac Sodium (VOLTAREN) 1 % topical gel 2 g  2 g Topical BID PRN  Richardean Sale, DO       diclofenac Sodium (VOLTAREN) 1 % topical gel 2 g  2 g Topical Daily PRN Richardean Sale, DO       Allergies  Allergen Reactions   Hydrocodone Nausea Only   Yellow Dyes (Non-Tartrazine) Hives and Itching      Objective:   Today's Vitals   08/25/23 1006  BP: 138/82  Pulse: 72  Temp: 98 F (36.7 C)  TempSrc: Temporal  SpO2: 98%  Weight: 217 lb 12.8 oz (98.8 kg)  Height: 5' 10.5" (1.791 m)   Body mass index is 30.81 kg/m.   General: Well developed, well nourished. No acute distress. Feet- Skin intact. Maceration between the right 3rd-4th and 4th-5th toes. The right 2nd toe has been   amputated. The surgical wound is well healed. There are multiple calluses on the soles and medial   aspect of the great toes with some evidence of bleeding into the callus. The right great nail is thickened   and discolored. Dorsalis pedis and posterior tibial artery pulses are normal. 5.07 monofilament shows   poor sensation distally. Psych: Alert and oriented. Normal mood and affect.  Health Maintenance Due  Topic Date Due   Pneumococcal Vaccine 1-86 Years old (1 of 2 - PCV) Never done   Hepatitis C Screening  Never done   OPHTHALMOLOGY EXAM  06/30/2019     Assessment & Plan:   Problem List Items Addressed This Visit       Cardiovascular and Mediastinum   Essential hypertension - Primary   Blood pressure is elevated today. Continue  HCTZ 25 mg daily, metoprolol tartrate 100 mg bid, and ramipril 10 mg bid. I will reassess at next visit.        Respiratory   Nodule of upper lobe of left lung   Reviewed prior pulmonology notes. Had biopsy in 05/2021. Dr. Tonia Brooms had recommended a follow-up noncontrast CT scan in 6 months. This appears to have never been done.      Relevant Orders   CT Chest Wo Contrast     Digestive   Barrett's esophagus   Continue omeprazole. Past due for follow-up with GI. May need repeat EGD  for surveillance.      Relevant  Medications   omeprazole (PRILOSEC) 20 MG capsule   Other Relevant Orders   Ambulatory referral to Gastroenterology     Endocrine   Diabetic peripheral neuropathy (HCC)   Past amputation. Reviewed need for improved blood glucose control and discussed daily foot care.      Relevant Medications   tirzepatide [facility]) 2.5 MG/0.5ML Pen   Type 2 diabetes mellitus with peripheral neuropathy (HCC)   Diabetes has been uncontrolled. I will check annual DM labs. He will continue to follow with Dr. Elvera Lennox. Continue tirzepatide (Mounjaro) 2.5 mg weekly, metformin 1,000 mg bid, insulin glargine (Lantus) 16 units at bedtime, and Novulin R on a sliding scale.      Relevant Medications   tirzepatide (MOUNJARO) 2.5 MG/0.5ML Pen   Other Relevant Orders   Lipid panel   Microalbumin / creatinine urine ratio   Basic metabolic panel with GFR   Hemoglobin A1c   Urinalysis, Routine w reflex microscopic     Other   Hyperlipidemia   Continue lovastatin 20 mg daily. Check lipids.      Other Visit Diagnoses       Tinea pedis of right foot       Recommend use of OTC antifungal cream.       Return in about 3 months (around 11/24/2023) for Reassessment.   Loyola Mast, MD

## 2023-08-25 NOTE — Assessment & Plan Note (Signed)
 Diabetes has been uncontrolled. I will check annual DM labs. He will continue to follow with Dr. Elvera Lennox. Continue tirzepatide (Mounjaro) 2.5 mg weekly, metformin 1,000 mg bid, insulin glargine (Lantus) 16 units at bedtime, and Novulin R on a sliding scale.

## 2023-08-25 NOTE — Assessment & Plan Note (Signed)
 Blood pressure is elevated today. Continue  HCTZ 25 mg daily, metoprolol tartrate 100 mg bid, and ramipril 10 mg bid. I will reassess at next visit.

## 2023-08-25 NOTE — Assessment & Plan Note (Signed)
 Continue lovastatin 20 mg daily. Check lipids.

## 2023-08-25 NOTE — Assessment & Plan Note (Signed)
 Reviewed prior pulmonology notes. Had biopsy in 05/2021. Dr. Tonia Brooms had recommended a follow-up noncontrast CT scan in 6 months. This appears to have never been done.

## 2023-08-25 NOTE — Assessment & Plan Note (Signed)
 Continue omeprazole. Past due for follow-up with GI. May need repeat EGD for surveillance.

## 2023-08-25 NOTE — Discharge Instructions (Signed)

## 2023-08-25 NOTE — Assessment & Plan Note (Signed)
 Past amputation. Reviewed need for improved blood glucose control and discussed daily foot care.

## 2023-08-26 ENCOUNTER — Ambulatory Visit
Admission: RE | Admit: 2023-08-26 | Discharge: 2023-08-26 | Disposition: A | Discharge status: Home / Self Care | Source: Ambulatory Visit | Attending: Sports Medicine | Admitting: Sports Medicine

## 2023-08-26 ENCOUNTER — Telehealth: Payer: Self-pay | Admitting: Internal Medicine

## 2023-08-26 DIAGNOSIS — M51362 Other intervertebral disc degeneration, lumbar region with discogenic back pain and lower extremity pain: Secondary | ICD-10-CM

## 2023-08-26 DIAGNOSIS — M545 Low back pain, unspecified: Secondary | ICD-10-CM

## 2023-08-26 MED ORDER — IOPAMIDOL (ISOVUE-M 200) INJECTION 41%
1.0000 mL | Freq: Once | INTRAMUSCULAR | Status: AC
  Administered 2023-08-26: 1 mL via EPIDURAL

## 2023-08-26 MED ORDER — DEXCOM G6 SENSOR MISC: 1.0000 | 0 refills | Status: DC

## 2023-08-26 MED ORDER — METHYLPREDNISOLONE ACETATE 40 MG/ML INJ SUSP (RADIOLOG
80.0000 mg | Freq: Once | INTRAMUSCULAR | Status: AC
Start: 2023-08-26 — End: 2023-08-26
  Administered 2023-08-26: 80 mg via EPIDURAL

## 2023-08-26 NOTE — Telephone Encounter (Signed)
 Patient is scheduled for 09/01/2023 with Dr. Elvera Lennox.

## 2023-08-26 NOTE — Telephone Encounter (Signed)
 MEDICATION: Dexcom G6 sensor   PHARMACY:    [id number] - [place], [place] - [address] (Ph: [phone])    HAS THE PATIENT CONTACTED THEIR PHARMACY?    IS THIS A 90 DAY SUPPLY :No  IS PATIENT OUT OF MEDICATION: Yes  IF NOT; HOW MUCH IS LEFT:   LAST APPOINTMENT DATE: @9 /02/2024  NEXT APPOINTMENT DATE:@4 /02/2024  DO WE HAVE YOUR PERMISSION TO LEAVE A DETAILED MESSAGE?: Yes  OTHER COMMENTS: Patient states that due to having 2 different procedures on different days that he is now out of sensors & would like 1 today if possible.  Patient states that it is too soon as far as insurance to get a refill & wants to know what he can do to get approval for 1 sensor.   **Let patient know to contact pharmacy at the end of the day to make sure medication is ready. **  ** Please notify patient to allow 48-72 hours to process**  **Encourage patient to contact the pharmacy for refills or they can request refills through [facility]**

## 2023-08-26 NOTE — Telephone Encounter (Signed)
 Pt is requesting refills, he needs to be seen.

## 2023-08-26 NOTE — Telephone Encounter (Signed)
 Requested Prescriptions   Signed Prescriptions Disp Refills   Continuous Glucose Sensor (DEXCOM G6 SENSOR) MISC 3 each 0    Sig: Inject 1 Device into the skin continuous for 10 days.    Authorizing Provider: Carlus Pavlov    Ordering User: Pollie Meyer

## 2023-08-31 ENCOUNTER — Encounter: Payer: Self-pay | Admitting: Podiatry

## 2023-08-31 ENCOUNTER — Ambulatory Visit (INDEPENDENT_AMBULATORY_CARE_PROVIDER_SITE_OTHER): Admitting: Podiatry

## 2023-08-31 VITALS — Ht 70.5 in | Wt 217.0 lb

## 2023-08-31 DIAGNOSIS — S98131A Complete traumatic amputation of one right lesser toe, initial encounter: Secondary | ICD-10-CM | POA: Diagnosis not present

## 2023-08-31 DIAGNOSIS — E1140 Type 2 diabetes mellitus with diabetic neuropathy, unspecified: Secondary | ICD-10-CM | POA: Diagnosis not present

## 2023-08-31 DIAGNOSIS — L84 Corns and callosities: Secondary | ICD-10-CM

## 2023-08-31 DIAGNOSIS — E1149 Type 2 diabetes mellitus with other diabetic neurological complication: Secondary | ICD-10-CM

## 2023-08-31 NOTE — Progress Notes (Signed)
 Subjective:   Patient ID: Raymond Pugh, male   DOB: 63 y.o.   MRN: [id number]   HPI Patient presents with significant keratotic lesions on the hallux bilateral with history of amputation right second toe that is not been checked   ROS      Objective:  Physical Exam  Neurovascular status indicates moderate diminishment of sharp dull vibratory bilateral history of infections with lesion formations     Assessment:  High risk diabetic with history of amputation recent that has healed but having lesion formation pathology     Plan:  H&P reviewed debrided tissue courtesy and discussed at great length the diabetes Daily inspections of his feet and the importance of keeping his A1c under good control and probability for future amputations depending on how this does

## 2023-09-01 ENCOUNTER — Other Ambulatory Visit

## 2023-09-01 ENCOUNTER — Other Ambulatory Visit: Payer: Self-pay | Admitting: Internal Medicine

## 2023-09-01 ENCOUNTER — Encounter: Payer: Self-pay | Admitting: Internal Medicine

## 2023-09-01 ENCOUNTER — Ambulatory Visit: Admitting: Internal Medicine

## 2023-09-01 VITALS — BP 138/80 | HR 111 | Ht 70.5 in | Wt 216.8 lb

## 2023-09-01 DIAGNOSIS — E1165 Type 2 diabetes mellitus with hyperglycemia: Secondary | ICD-10-CM

## 2023-09-01 DIAGNOSIS — E785 Hyperlipidemia, unspecified: Secondary | ICD-10-CM | POA: Diagnosis not present

## 2023-09-01 DIAGNOSIS — E663 Overweight: Secondary | ICD-10-CM

## 2023-09-01 DIAGNOSIS — Z794 Long term (current) use of insulin: Secondary | ICD-10-CM | POA: Diagnosis not present

## 2023-09-01 LAB — POCT GLYCOSYLATED HEMOGLOBIN (HGB A1C): Hemoglobin A1C: 9.8 % — AB (ref 4.0–5.6)

## 2023-09-01 MED ORDER — TIRZEPATIDE 5 MG/0.5ML ~~LOC~~ SOAJ
5.0000 mg | SUBCUTANEOUS | 11 refills | Status: DC
Start: 2023-09-01 — End: 2023-12-06
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
## 2023-09-01 NOTE — Patient Instructions (Addendum)
 Please continue: - Metformin 1000 mg 2x a day - Lantus 30 units in am - R insulin 30 minutes before meals: 20-30 units, and 10 unit for a snack  Please increase: - Mounjaro 5 mg weekly  Please let me know if we can increase the Mounjaro dose in 1 month.  Please come back in 4-6 months.

## 2023-09-06 ENCOUNTER — Encounter: Payer: Self-pay | Admitting: Family Medicine

## 2023-09-06 ENCOUNTER — Other Ambulatory Visit (INDEPENDENT_AMBULATORY_CARE_PROVIDER_SITE_OTHER)

## 2023-09-06 DIAGNOSIS — E1142 Type 2 diabetes mellitus with diabetic polyneuropathy: Secondary | ICD-10-CM

## 2023-09-06 LAB — MICROALBUMIN / CREATININE URINE RATIO
Creatinine,U: 121.4 mg/dL
Microalb Creat Ratio: 39.2 mg/g — ABNORMAL HIGH (ref 0.0–30.0)
Microalb, Ur: 4.8 mg/dL — ABNORMAL HIGH (ref 0.0–1.9)

## 2023-09-06 LAB — LIPID PANEL
Cholesterol: 146 mg/dL (ref 0–200)
HDL: 48.8 mg/dL (ref >39.00)
LDL Cholesterol: 80 mg/dL (ref 0–99)
NonHDL: 97.54
Total CHOL/HDL Ratio: 3
Triglycerides: 88 mg/dL (ref 0.0–149.0)
VLDL: 17.6 mg/dL (ref 0.0–40.0)

## 2023-09-06 LAB — BASIC METABOLIC PANEL WITH GFR
BUN: 14 mg/dL (ref 6–23)
CO2: 32 meq/L (ref 19–32)
Calcium: 8.7 mg/dL (ref 8.4–10.5)
Chloride: 99 meq/L (ref 96–112)
Creatinine, Ser: 0.68 mg/dL (ref 0.40–1.50)
GFR: 99.58 mL/min (ref >60.00)
Glucose, Bld: 265 mg/dL — ABNORMAL HIGH (ref 70–99)
Potassium: 3.7 meq/L (ref 3.5–5.1)
Sodium: 139 meq/L (ref 135–145)

## 2023-09-06 LAB — HEMOGLOBIN A1C: Hgb A1c MFr Bld: 9.9 % — ABNORMAL HIGH (ref 4.6–6.5)

## 2023-09-07 ENCOUNTER — Other Ambulatory Visit

## 2023-09-20 ENCOUNTER — Other Ambulatory Visit (HOSPITAL_COMMUNITY): Payer: Self-pay

## 2023-10-01 ENCOUNTER — Other Ambulatory Visit: Payer: Self-pay | Admitting: Internal Medicine

## 2023-10-01 DIAGNOSIS — E119 Type 2 diabetes mellitus without complications: Secondary | ICD-10-CM

## 2023-10-03 ENCOUNTER — Encounter: Payer: Self-pay | Admitting: Internal Medicine

## 2023-10-04 ENCOUNTER — Other Ambulatory Visit (HOSPITAL_COMMUNITY): Payer: Self-pay

## 2023-10-04 ENCOUNTER — Telehealth: Payer: Self-pay

## 2023-10-04 NOTE — Telephone Encounter (Signed)
 Pt needs PA for Novolin R

## 2023-10-04 NOTE — Telephone Encounter (Signed)
 Pharmacy Patient Advocate Encounter   Received notification from Pt Calls Messages that prior authorization for Novolin is required/requested.   Insurance verification completed.   The patient is insured through [facility] .   Per test claim:  Humulin is preferred by the insurance.  If suggested medication is appropriate, Please send in a new RX and discontinue this one. If not, please advise as to why it's not appropriate so that we may request a Prior Authorization. Please note, some preferred medications may still require a PA.  If the suggested medications have not been trialed and there are no contraindications to their use, the PA will not be submitted, as it will not be approved.   Novolin was switched to Humulin in February due to insurance preference.

## 2023-10-04 NOTE — Telephone Encounter (Signed)
 Pt has been notified.

## 2023-11-10 ENCOUNTER — Ambulatory Visit: Admitting: Gastroenterology

## 2023-11-13 ENCOUNTER — Other Ambulatory Visit: Payer: Self-pay | Admitting: Internal Medicine

## 2023-11-14 ENCOUNTER — Other Ambulatory Visit: Payer: Self-pay | Admitting: Internal Medicine

## 2023-11-17 ENCOUNTER — Encounter: Payer: Self-pay | Admitting: Internal Medicine

## 2023-11-18 MED ORDER — GLUCOSE BLOOD VI STRP: ORAL_STRIP | 5 refills | Status: AC

## 2023-11-28 ENCOUNTER — Encounter: Payer: Self-pay | Admitting: Family Medicine

## 2023-11-28 ENCOUNTER — Ambulatory Visit (INDEPENDENT_AMBULATORY_CARE_PROVIDER_SITE_OTHER): Admitting: Family Medicine

## 2023-11-28 VITALS — BP 150/84 | HR 88 | Temp 98.3°F | Ht 70.5 in | Wt 226.8 lb

## 2023-11-28 DIAGNOSIS — Z23 Encounter for immunization: Secondary | ICD-10-CM

## 2023-11-28 DIAGNOSIS — E1142 Type 2 diabetes mellitus with diabetic polyneuropathy: Secondary | ICD-10-CM

## 2023-11-28 DIAGNOSIS — S90111A Contusion of right great toe without damage to nail, initial encounter: Secondary | ICD-10-CM | POA: Diagnosis not present

## 2023-11-28 DIAGNOSIS — Z794 Long term (current) use of insulin: Secondary | ICD-10-CM

## 2023-11-28 DIAGNOSIS — Z7985 Long-term (current) use of injectable non-insulin antidiabetic drugs: Secondary | ICD-10-CM

## 2023-11-28 DIAGNOSIS — Z7984 Long term (current) use of oral hypoglycemic drugs: Secondary | ICD-10-CM

## 2023-11-28 DIAGNOSIS — M48061 Spinal stenosis, lumbar region without neurogenic claudication: Secondary | ICD-10-CM | POA: Insufficient documentation

## 2023-11-28 DIAGNOSIS — E782 Mixed hyperlipidemia: Secondary | ICD-10-CM

## 2023-11-28 DIAGNOSIS — I10 Essential (primary) hypertension: Secondary | ICD-10-CM

## 2023-11-28 MED ORDER — RAMIPRIL 10 MG PO CAPS: 20.0000 mg | ORAL_CAPSULE | Freq: Every day | ORAL | 3 refills | Status: AC

## 2023-11-28 NOTE — Assessment & Plan Note (Signed)
 Lipids are at goal. Continue lovastatin  20 mg daily.

## 2023-11-28 NOTE — Assessment & Plan Note (Signed)
 Diabetes has been uncontrolled. I will check an A1c today. He will continue to follow with Dr. Trixie. Continue metformin  1,000 mg bid, insulin  glargine (Lantus ) 16 units at bedtime, and Novulin R on a sliding scale. I urged him to reach out to Dr. Ara office regarding his PA for his tirzepatide  (Mounjaro ) 2.5 mg weekly.

## 2023-11-28 NOTE — Assessment & Plan Note (Signed)
 Past amputation. Continue focus on blood glucose control and daily foot care.

## 2023-11-28 NOTE — Progress Notes (Signed)
 [facility] [address] [place] [address] Dept: [phone] Dept Fax: [phone]  Chronic Care Office Visit  Subjective:    Patient ID: Raymond Pugh, male    DOB: May 12, 1961, 63 y.o..   MRN: [id number]  Chief Complaint  Patient presents with   Hypertension    3 month f/u HTN.    History of Present Illness:  Patient is in today for reassessment of chronic medical issues.  Raymond Pugh has a history of Type 2 diabetes. He is followed by Dr. Trixie. His diabetes has been complicated by peripheral neuropathy and a right 2nd toe amputation in December. He is currently managed on metformin  1,000 mg bid, insulin  glargine (Lantus ) 16 units at bedtime, and Novulin R on a sliding scale. He has been prescribed tirzepatide  (Mounjaro ) 2.5 mg weekly. He was given an initial supply, but then his insurance was requiring a preauthorization, so he is no longer on this. He notes he has called Dr. Ara office to ask about this, but approval has not been resolved.   Raymond Pugh has a history of hypertension. He is managed on HCTZ 25 mg daily, metoprolol  tartrate 100 mg bid, and ramipril  10 mg bid. He notes that he did not take his heart meds this mornign. He often forgets to take his 2nd dose of ramipril  each day.   Raymond Pugh has hyperlipidemia. He is managed on lovastatin  20 mg daily.   Raymond Pugh notes that he stubbed his toe 2 days ago. He has seen a darkened area at the end of the toe. This is not painful.  Past Medical History: Patient Active Problem List   Diagnosis Date Noted   Spinal stenosis of lumbar region 11/28/2023   History of amputation of right second toe (HCC) 08/25/2023   Diabetic peripheral neuropathy (HCC) 08/25/2023   Seasonal allergic rhinitis 08/25/2023   Degeneration of intervertebral disc of lumbar region with discogenic back pain 08/25/2023   Type 2 diabetes mellitus with peripheral neuropathy (HCC)  05/20/2023   Nodule of upper lobe of left lung 05/11/2021   Overweight (BMI 25.0-29.9) 05/29/2019   Liver abscess 06/24/2014   Class 1 obesity due to excess calories with serious comorbidity and body mass index (BMI) of 30.0 to 30.9 in adult 08/25/2010   Barrett's esophagus 07/03/2010   Hyperlipidemia 11/04/2008   Attention deficit hyperactivity disorder (ADHD) 08/14/2007   Essential hypertension 11/03/2006   Past Surgical History:  Procedure Laterality Date   ABSCESS DRAIN LIVER PERC ([facility] HX)     klebsiella on liver pa states    AMPUTATION TOE Right 05/23/2023   Procedure: AMPUTATION TOE;  Surgeon: Malvin Marsa FALCON, DPM;  Location: [facility] OR;  Service: Orthopedics/Podiatry;  Laterality: Right;  Right 2nd toe amputation at MPJ level   BRONCHIAL BIOPSY  06/09/2021   Procedure: BRONCHIAL BIOPSIES;  Surgeon: Brenna Adine CROME, DO;  Location: [facility] ENDOSCOPY;  Service: Pulmonary;;   BRONCHIAL NEEDLE ASPIRATION BIOPSY  06/09/2021   Procedure: BRONCHIAL NEEDLE ASPIRATION BIOPSIES;  Surgeon: Brenna Adine CROME, DO;  Location: [facility] ENDOS[facility];  Service: Pulmonary;;   BRONCHIAL WASHINGS  06/09/2021   Procedure: BRONCHIAL WASHINGS;  Surgeon: Brenna Adine CROME, DO;  Location: [facility];  Service: Pulmonary;;   CARDIAC CATHETERIZATION     CATARACT EXTRACTION W/PHACO Left 03/09/2022   Procedure: CATARACT EXTRACTION PHACO AND INTRAOCULAR LENS PLACEMENT (IOC) LEFT DIABETIC 5.16 00:36.6;  Surgeon: Jaye Fallow, MD;  Location: [facility];  Service: Ophthalmology;  Laterality: Left;  Diabetic  EYE SURGERY     HYDROCELE EXCISION / REPAIR     RADIAL KERATOTOMY     RETINAL DETACHMENT SURGERY     VIDEO BRONCHOSCOPY WITH RADIAL ENDOBRONCHIAL ULTRASOUND  06/09/2021   Procedure: VIDEO BRONCHOSCOPY WITH RADIAL ENDOBRONCHIAL ULTRASOUND;  Surgeon: Brenna Adine CROME, DO;  Location: [facility];  Service: Pulmonary;;   Family History  Problem Relation Age of Onset   Hypertension Mother    Asthma  Mother    Cancer Father        Bone   Diabetes Father    Hypertension Father    Colon polyps Father    Diabetes Paternal Grandfather    ADD / ADHD Sister    ADD / ADHD Brother    Prostate cancer Neg Hx    Outpatient Medications Prior to Visit  Medication Sig Dispense Refill   acetaminophen  (TYLENOL ) 325 MG tablet Take 2 tablets (650 mg total) by mouth every 6 (six) hours as needed for mild pain (pain score 1-3) (or Fever >/= 101).     ALPRAZolam  (XANAX ) 0.5 MG tablet Take 1 tablet (0.5 mg total) by mouth 2 (two) times daily as needed for anxiety. 20 tablet 0   ascorbic acid  (VITAMIN C ) 500 MG tablet Take 1 tablet (500 mg total) by mouth daily.     Continuous Glucose Transmitter (DEXCOM G6 TRANSMITTER) MISC USE AS DIRECTED AND CHANGE EVERY 90 DAYS 1 each 0   cyclobenzaprine  (FLEXERIL ) 5 MG tablet TAKE 1 TABLET BY MOUTH EVERYDAY AT BEDTIME 40 tablet 0   diclofenac  Sodium (VOLTAREN ) 1 % GEL Apply 2 g topically 4 (four) times daily. To affected joint. 100 g 5   gabapentin  (NEURONTIN ) 100 MG capsule TAKE 1 CAPSULE(100 MG) BY MOUTH TWICE DAILY 180 capsule 1   glucose blood test strip Use to check blood sugar 3 times a day 300 each 5   hydrochlorothiazide  (HYDRODIURIL ) 25 MG tablet TAKE 1 TABLET(25 MG) BY MOUTH DAILY 90 tablet 1   insulin  glargine (LANTUS ) 100 unit/mL SOPN Inject 40 Units into the skin daily. 15 mL 11   insulin  regular (HUMULIN R ) 100 units/mL injection Inject 0.2-0.3 mLs (20-30 Units total) into the skin 3 (three) times daily before meals. 90 mL 2   Insulin  Syringe-Needle U-100 (RELION INSULIN  SYRINGE) 31G X 15/64 0.5 ML MISC Use to inject insulin  3 times a day. NO REFILLS WITHOUT APPOINTMENT 300 each 0   Lancets (ONETOUCH ULTRASOFT) lancets Use to check blood sugar 3 times a day 100 each 0   lovastatin  (MEVACOR ) 20 MG tablet Take 1 tablet (20 mg total) by mouth at bedtime. TAKE 1 TABLET(20 MG) BY MOUTH AT BEDTIME 90 tablet 1   meloxicam  (MOBIC ) 15 MG tablet Take 1 tablet (15  mg total) by mouth daily as needed for pain. 30 tablet 0   metFORMIN  (GLUCOPHAGE ) 1000 MG tablet TAKE 1 TABLET BY MOUTH TWICE A DAY 180 tablet 3   metoprolol  tartrate (LOPRESSOR ) 100 MG tablet TAKE 1 TABLET(100 MG) BY MOUTH TWICE DAILY 180 tablet 1   omeprazole  (PRILOSEC) 20 MG capsule TAKE 1 CAPSULE BY MOUTH IN THE MORNING 90 capsule 3   ondansetron  (ZOFRAN ) 4 MG tablet Take 1 tablet (4 mg total) by mouth every 8 (eight) hours as needed for nausea or vomiting. 16 tablet 0   tiZANidine  (ZANAFLEX ) 4 MG tablet Take 1 tablet (4 mg total) by mouth at bedtime as needed. 30 tablet 0   zinc  sulfate, 50mg  elemental zinc , 220 (50 Zn) MG capsule Take 1  capsule (220 mg total) by mouth daily.     ramipril  (ALTACE ) 10 MG capsule TAKE 1 CAPSULE(10 MG) BY MOUTH TWICE DAILY 180 capsule 1   tirzepatide  (MOUNJARO ) 5 MG/0.5ML Pen Inject 5 mg into the skin once a week. (Patient not taking: Reported on 11/28/2023) 2 mL 11   Facility-Administered Medications Prior to Visit  Medication Dose Route Frequency Provider Last Rate Last Admin   diclofenac  Sodium (VOLTAREN ) 1 % topical gel 2 g  2 g Topical BID PRN Leonce Katz, DO       diclofenac  Sodium (VOLTAREN ) 1 % topical gel 2 g  2 g Topical Daily PRN Leonce Katz, DO       Allergies  Allergen Reactions   Hydrocodone  Nausea Only   Yellow Dyes (Non-Tartrazine) Hives and Itching   Objective:   Today's Vitals   11/28/23 0804  Temp: 98.3 F (36.8 C)  TempSrc: Temporal  Weight: 226 lb 12.8 oz (102.9 kg)  Height: 5' 10.5 (1.791 m)   Body mass index is 32.08 kg/m.   General: Well developed, well nourished. No acute distress. Foot: There is an area of bruising over the distal right 1st toe with some old callus and peeling noted. Good ROM of that toe.  Psych: Alert and oriented. Normal mood and affect.  Health Maintenance Due  Topic Date Due   Hepatitis C Screening  Never done   Pneumococcal Vaccine 104-4 Years old (1 of 2 - PCV) Never done    OPHTHALMOLOGY EXAM  06/30/2019     Assessment & Plan:   Problem List Items Addressed This Visit       Cardiovascular and Mediastinum   Essential hypertension - Primary   Blood pressure has been elevated at both visits with me. Medication adherence is a problem. Continue  HCTZ 25 mg daily and metoprolol  tartrate 100 mg bid. I recommend he change his ramipril  to 10 mg, 2 tablets daily (rather than one bid). I will see if this will improve his adherence.      Relevant Medications   ramipril  (ALTACE ) 10 MG capsule     Endocrine   Diabetic peripheral neuropathy (HCC)   Past amputation. Continue focus on blood glucose control and daily foot care.      Relevant Medications   ramipril  (ALTACE ) 10 MG capsule   Type 2 diabetes mellitus with peripheral neuropathy (HCC)   Diabetes has been uncontrolled. I will check an A1c today. He will continue to follow with Dr. Trixie. Continue metformin  1,000 mg bid, insulin  glargine (Lantus ) 16 units at bedtime, and Novulin R on a sliding scale. I urged him to reach out to Dr. Ara office regarding his PA for his tirzepatide  (Mounjaro ) 2.5 mg weekly.      Relevant Medications   ramipril  (ALTACE ) 10 MG capsule   Other Relevant Orders   Glucose, random   Hemoglobin A1c     Other   Hyperlipidemia   Lipids are at goal. Continue lovastatin  20 mg daily.      Relevant Medications   ramipril  (ALTACE ) 10 MG capsule   Other Visit Diagnoses       Contusion of right great toe without damage to nail, initial encounter       Appears to be bruised. No sign of fracture. I recommend he follow-up with podiatry for ongoign surveillance.     Need for pneumococcal 20-valent conjugate vaccination       Relevant Orders   Pneumococcal conjugate vaccine 20-valent (Completed)  Return in about 3 months (around 02/28/2024) for Reassessment.   Garnette CHRISTELLA Simpler, MD

## 2023-11-28 NOTE — Assessment & Plan Note (Signed)
 Blood pressure has been elevated at both visits with me. Medication adherence is a problem. Continue  HCTZ 25 mg daily and metoprolol  tartrate 100 mg bid. I recommend he change his ramipril  to 10 mg, 2 tablets daily (rather than one bid). I will see if this will improve his adherence.

## 2023-12-01 ENCOUNTER — Other Ambulatory Visit

## 2023-12-01 ENCOUNTER — Ambulatory Visit: Payer: Self-pay | Admitting: Family Medicine

## 2023-12-01 DIAGNOSIS — E1142 Type 2 diabetes mellitus with diabetic polyneuropathy: Secondary | ICD-10-CM

## 2023-12-01 LAB — HEMOGLOBIN A1C: Hgb A1c MFr Bld: 9.8 % — ABNORMAL HIGH (ref 4.6–6.5)

## 2023-12-01 LAB — GLUCOSE, RANDOM: Glucose, Bld: 221 mg/dL — ABNORMAL HIGH (ref 70–99)

## 2023-12-02 ENCOUNTER — Telehealth: Payer: Self-pay

## 2023-12-02 ENCOUNTER — Other Ambulatory Visit

## 2023-12-02 NOTE — Progress Notes (Signed)
   12/02/2023  Patient ID: Raymond Pugh, male   DOB: 07-Oct-1960, 63 y.o.   MRN: [id number]  Received urgent referral from PCP to assist with patient's Mounjaro  needing prior authorization.  Attempted to complete PA through CoverMyMeds but it showed Mounjaro  had already been denied and that trulicity  or victoza would have to be tried and failed first before insurance would consider approving Mounjaro . Reference: [id number]  Chart review confirms this was completed back in Feb and Trulicity  was prescribed. However, trulicity  also needed a PA that was not completed.   Contacted patient for appt and confirmed he has not tried trulicity  or victoza. States he tried to pick up Trulicity  1.5mg  as prescribed back in Feb but it needed PA.  Submitted PA for trulicity  1.5mg  via CoverMyMeds today. Key: [id number]. Pending insurance response.  Forwarding to [place] for follow up.  Raymond Pugh, PharmD Clinical Pharmacist [phone]

## 2023-12-02 NOTE — Progress Notes (Signed)
 Care Guide Pharmacy Note  12/02/2023 Name: Raymond Pugh MRN: [id number] DOB: [birth year]  Referred By: Thedora Garnette HERO, MD Reason for referral: Complex Care Management (Outreach to schedule with Pharm d )   Raymond Pugh is a 63 y.o. year old male who is a primary care patient of Thedora Garnette HERO, MD.  Raymond Pugh was referred to the pharmacist for assistance related to: DMII  Successful contact was made with the patient to discuss pharmacy services including being ready for the pharmacist to call at least 5 minutes before the scheduled appointment time and to have medication bottles and any blood pressure readings ready for review. The patient agreed to meet with the pharmacist via telephone visit on (date/time).12/02/2023  Jeoffrey Buffalo , RMA     [place]  [facility], [facility] Guide  Direct Dial: [phone]  Website: [email or web address]

## 2023-12-05 ENCOUNTER — Telehealth: Payer: Self-pay

## 2023-12-05 ENCOUNTER — Encounter: Payer: Self-pay | Admitting: Podiatry

## 2023-12-05 ENCOUNTER — Ambulatory Visit (INDEPENDENT_AMBULATORY_CARE_PROVIDER_SITE_OTHER): Admitting: Podiatry

## 2023-12-05 VITALS — Ht 70.5 in | Wt 226.0 lb

## 2023-12-05 DIAGNOSIS — D492 Neoplasm of unspecified behavior of bone, soft tissue, and skin: Secondary | ICD-10-CM

## 2023-12-05 NOTE — Progress Notes (Signed)
   12/05/2023  Patient ID: Raymond Pugh, male   DOB: 1960-07-08, 63 y.o.   MRN: [id number]  Prior authorization for Trulicity  was approved, but Dr. Trixie does not believe this treatment would be effective in getting A1c to goal of <7%.  Patient has been prescribed both Trulicity  and Ozempic  in the past, and has not been abe to obtain A1c <8% since at least 2023.  Most recent A1c 9.8% on 7/10.  Submitting another PA request to insurance for Mounjaro ; if denied, will try for Ozempic  coverage.  CoverMyMeds Key:  [id number]  Raymond Pugh, PharmD, DPLA

## 2023-12-06 ENCOUNTER — Telehealth: Payer: Self-pay

## 2023-12-06 MED ORDER — OZEMPIC (0.25 OR 0.5 MG/DOSE) 2 MG/3ML ~~LOC~~ SOPN: PEN_INJECTOR | SUBCUTANEOUS | 3 refills | Status: DC

## 2023-12-06 NOTE — Progress Notes (Signed)
   12/06/2023  Patient ID: Lorrene JONETTA Minus, male   DOB: 06-05-1960, 63 y.o.   MRN: [id number]  Prior authorization for Ozempic  0.25/0.5mg  has been approved.  Patient has not filled Mounjaro  since and 1 month supply was filled in February, so I recommend starting Ozempic  at 0.25mg  weekly x4 weeks, then increasing to 0.5mg  weekly if tolerating medication well.  Mounjaro  was covered by patients previous insurance plan, but current Medicaid coverage prefers Ozempic  or Trulicity  for once weekly GLP1 injections.  Dr. Trixie prefers Ozempic  for this patient based on likelihood of increased efficacy.  Order pending for Ozempic  for Dr. Trixie to sign if in agreement, and I will check in with patient every 4 weeks if she would like to assess efficacy/tolerance and ability to increase dose.  Channing DELENA Mealing, PharmD, DPLA

## 2023-12-06 NOTE — Progress Notes (Addendum)
   12/06/2023  Patient ID: Raymond Pugh, male   DOB: 05-02-1961, 63 y.o.   MRN: [id number]  Mounjaro  PA denied; submitted PA for Ozempic  0.25/0.5mg  weekly via Covermymeds (KEY:  [id number])  Channing DELENA Mealing, PharmD, DPLA

## 2023-12-06 NOTE — Progress Notes (Signed)
 Subjective:   Patient ID: Raymond Pugh, male   DOB: 63 y.o.   MRN: [id number]   HPI Patient states that he has been having problems with both of his feet does have long-term diabetes and chronic keratotic lesion formation   ROS      Objective:  Physical Exam  Neurovascular status unchanged from previous visit with patient found to have thick keratotic tissue bilateral with slight irritation of the tissue itself but no proximal edema erythema drainage no indication of any form of systemic cellulitic event with history of amputation right second toe healing well     Assessment:  Patient who is a poorly controlled diabetic who developed significant lesions secondary to foot structure     Plan:  H&P reviewed and we discussed condition at this point deep debridement of benign neoplasm accomplished no breakdown of tissue noted and I discussed that this will need to be done on a periodic basis to try to keep symptoms under control but he is always at high risk of amputation given his poor control lesion formation and history of amputation.  At this time he stable will be seen back to recheck all questions answered today concerning diabetes importance of control and daily foot inspections

## 2023-12-07 ENCOUNTER — Telehealth: Payer: Self-pay

## 2023-12-07 NOTE — Progress Notes (Signed)
   12/07/2023  Patient ID: Raymond Pugh, male   DOB: November 13, 1960, 63 y.o.   MRN: [id number]  Subjective/Objective Patient outreach to discuss management of diabetes  Diabetes -Current medications:  Humulin R  TID before meals, Lantus , metformin  1000mg  BID -Patient recently instructed to take Humulin R  20-25 units before meals and 10 units before a snack, but it is unclear exactly how he is currently using this -He was also instructed to begin using Lantus  30 units daily, because he was taking as needed.  Fill history does not show any recent fills for long acting insulin . -Previously using Ozempic  but did not tolerate doses >1mg  well, so this was switched to Mounjaro .  Mounjaro  is not covered by his new insurance plan (Medicaid).  Plan prefers Trulicity  or Ozempic , and Dr. Trixie prefers patient resume Ozempic  based on likelihood this medication will be more effective than Trulicity . -Prior authorization for Ozempic  0.25/0.5mg  has been approved and [facility] is working on filling medication -Patient is using Dexcom G6 for CGM and OTC testing supplies from [facility] to spot check with fingersticks on occasion -Most recently, A1c elevated at 9.8% on 7/10, but patient has been without GLP1 medication for several months  Assessment/Plan  Diabetes -Resume Ozempic  0.25mg  weekly x4 weeks, increasing to 0.5mg  weekly thereafter if tolerating well -Use Humulin R  20-25 units before meals and 10 units before a snack and Lantus  30 units daily -Continue to use Dexcom G6 for CGM; educated patient that Medicaid will cover [employer] guide testing supplies if he would like to change to this in the future versus purchasing [facility] brand OTC -Sees Dr. Trixie with endocrinology again 8/11 and Dr. Thedora again in October (due for A1c again at this time)  Follow-up Plan:  I will check in with patient again in 6 weeks to see how he is tolerating Ozempic  and effect on home BG  Channing DELENA Mealing, PharmD, DPLA

## 2023-12-13 ENCOUNTER — Telehealth: Payer: Self-pay

## 2023-12-13 NOTE — Telephone Encounter (Signed)
-----   Message from Lela Fendt sent at 12/07/2023  2:47 PM EDT ----- Regarding: FW:  J, Please see below, can you please check with him if he is taking the insulin ? Ty! C ----- Message ----- From: Deanna Channing LABOR, [facility] Sent: 12/07/2023   1:48 PM EDT To: Lela Fendt, MD  Good afternoon,  I informed patient PA has been approved for Ozempic , and he will pick medication up from [facility] to resume therapy.  I do not see that he has filled any long acting insulin  in quite a while, so you may want to verify he is using this as prescribed and discussed at his last visit with you when you see him again next month- just FYI.  I will check in with him again in 5 weeks to see how things are going with Ozempic  and home BG readings.  Thank you!

## 2023-12-13 NOTE — Telephone Encounter (Signed)
Phone goes straight to voice mail. LMTRC

## 2024-01-02 ENCOUNTER — Encounter: Payer: Self-pay | Admitting: Internal Medicine

## 2024-01-02 ENCOUNTER — Ambulatory Visit (INDEPENDENT_AMBULATORY_CARE_PROVIDER_SITE_OTHER): Admitting: Internal Medicine

## 2024-01-02 VITALS — BP 124/70 | HR 76 | Ht 70.5 in | Wt 223.8 lb

## 2024-01-02 DIAGNOSIS — E663 Overweight: Secondary | ICD-10-CM

## 2024-01-02 DIAGNOSIS — E785 Hyperlipidemia, unspecified: Secondary | ICD-10-CM

## 2024-01-02 DIAGNOSIS — Z794 Long term (current) use of insulin: Secondary | ICD-10-CM

## 2024-01-02 DIAGNOSIS — E1165 Type 2 diabetes mellitus with hyperglycemia: Secondary | ICD-10-CM

## 2024-01-02 NOTE — Patient Instructions (Addendum)
 Please continue: - Metformin  1000 mg 2x a day  Take: - Lantus  30 units every day - R insulin  10-18 units before meals - Ozempic  0.5 mg weekly  Please come back in 3-4 months.
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
## 2024-01-05 ENCOUNTER — Ambulatory Visit: Payer: Self-pay | Admitting: Internal Medicine

## 2024-01-05 LAB — MICROALBUMIN / CREATININE URINE RATIO
Creatinine, Urine: 53 mg/dL (ref 20–320)
Microalb Creat Ratio: 100 mg/g{creat} — ABNORMAL HIGH (ref <30)
Microalb, Ur: 5.3 mg/dL

## 2024-01-05 MED ORDER — DAPAGLIFLOZIN PROPANEDIOL 10 MG PO TABS: 10.0000 mg | ORAL_TABLET | Freq: Every day | ORAL | 1 refills | Status: DC

## 2024-01-05 NOTE — Addendum Note (Signed)
 Addended by: TRIXIE FILE on: 01/05/2024 04:58 PM   Modules accepted: Orders

## 2024-01-27 ENCOUNTER — Encounter: Payer: Self-pay | Admitting: Family Medicine

## 2024-01-27 NOTE — Telephone Encounter (Signed)
 Spoke with patient's wife, Raymond Pugh, regarding the covid prescription in the lobby at the conclusion of her OV with Dr. Sebastian, her PCP. Pt wants to know why she was able to receive a script from her PCP, but her husband cannot receive one from Dr. Thedora.  I explained to Ms. Raymond Pugh that this guideline requiring a prescription for a covid vaccine is very new for us . Division leadership and the providers in the office are trying to determine a standardized workflow and process for handling this new guideline, while also aligning with the pharmacies and their requirements. I told her I will need to ask if another provider is willing to write the prescription.

## 2024-02-03 ENCOUNTER — Other Ambulatory Visit: Payer: Self-pay | Admitting: Internal Medicine

## 2024-02-03 DIAGNOSIS — Z794 Long term (current) use of insulin: Secondary | ICD-10-CM

## 2024-02-28 ENCOUNTER — Encounter: Payer: Self-pay | Admitting: Family Medicine

## 2024-02-28 ENCOUNTER — Ambulatory Visit: Payer: Self-pay | Admitting: Family Medicine

## 2024-02-28 ENCOUNTER — Ambulatory Visit (INDEPENDENT_AMBULATORY_CARE_PROVIDER_SITE_OTHER): Admitting: Family Medicine

## 2024-02-28 VITALS — BP 124/76 | HR 79 | Temp 97.0°F | Ht 70.5 in | Wt 213.0 lb

## 2024-02-28 DIAGNOSIS — E1142 Type 2 diabetes mellitus with diabetic polyneuropathy: Secondary | ICD-10-CM | POA: Diagnosis not present

## 2024-02-28 DIAGNOSIS — F418 Other specified anxiety disorders: Secondary | ICD-10-CM | POA: Diagnosis not present

## 2024-02-28 DIAGNOSIS — I10 Essential (primary) hypertension: Secondary | ICD-10-CM | POA: Diagnosis not present

## 2024-02-28 DIAGNOSIS — Z7985 Long-term (current) use of injectable non-insulin antidiabetic drugs: Secondary | ICD-10-CM

## 2024-02-28 DIAGNOSIS — Z1159 Encounter for screening for other viral diseases: Secondary | ICD-10-CM

## 2024-02-28 DIAGNOSIS — Z794 Long term (current) use of insulin: Secondary | ICD-10-CM

## 2024-02-28 DIAGNOSIS — E782 Mixed hyperlipidemia: Secondary | ICD-10-CM | POA: Diagnosis not present

## 2024-02-28 LAB — HEMOGLOBIN A1C: Hgb A1c MFr Bld: 9.6 % — ABNORMAL HIGH (ref 4.6–6.5)

## 2024-02-28 LAB — GLUCOSE, RANDOM: Glucose, Bld: 224 mg/dL — ABNORMAL HIGH (ref 70–99)

## 2024-02-28 MED ORDER — ALPRAZOLAM 0.5 MG PO TABS: 0.5000 mg | ORAL_TABLET | Freq: Two times a day (BID) | ORAL | 0 refills | Status: DC | PRN

## 2024-02-28 NOTE — Progress Notes (Signed)
 [facility] [address] [place] [address] Dept: [phone] Dept Fax: [phone]  Chronic Care Office Visit  Subjective:    Patient ID: Raymond Pugh, male    DOB: 1960/07/02, 63 y.o..   MRN: [id number]  Chief Complaint  Patient presents with   Hypertension    3 month f/u HTN.  No concerns.     History of Present Illness:  Patient is in today for reassessment of chronic medical issues.  Raymond Pugh has a history of Type 2 diabetes. He is followed by Dr. Trixie. His diabetes has been complicated by peripheral neuropathy and a right 2nd toe amputation in December 2024. He is currently managed on metformin  1,000 mg bid, insulin  glargine (Lantus ) 30 units at bedtime, Novulin R on a sliding scale, and semaglutide  (Ozempic ) 0.5 mg weekly. He had previously been on tirzepatide , but ran into an insurance issue. Our pharmacist was able to assist him in getting approval for Ozempic . He is on his 3rd week of the 0.5 mg dose. the plan is for him to step up to 1 mg after his next dose.   Raymond Pugh has a history of hypertension. He is managed on HCTZ 25 mg daily, metoprolol  tartrate 100 mg bid, and ramipril  10 mg 2 tabs daily.   Raymond Pugh has hyperlipidemia. He is managed on lovastatin  20 mg daily.  Past Medical History: Patient Active Problem List   Diagnosis Date Noted   Situational anxiety 02/28/2024   Spinal stenosis of lumbar region 11/28/2023   History of amputation of right second toe 08/25/2023   Diabetic peripheral neuropathy (HCC) 08/25/2023   Seasonal allergic rhinitis 08/25/2023   Degeneration of intervertebral disc of lumbar region with discogenic back pain 08/25/2023   Type 2 diabetes mellitus with peripheral neuropathy (HCC) 05/20/2023   Nodule of upper lobe of left lung 05/11/2021   Overweight (BMI 25.0-29.9) 05/29/2019   Liver abscess 06/24/2014   Class 1 obesity due to excess calories with serious  comorbidity and body mass index (BMI) of 30.0 to 30.9 in adult 08/25/2010   Barrett's esophagus 07/03/2010   Hyperlipidemia 11/04/2008   Attention deficit hyperactivity disorder (ADHD) 08/14/2007   Essential hypertension 11/03/2006   Past Surgical History:  Procedure Laterality Date   ABSCESS DRAIN LIVER PERC ([facility] HX)     klebsiella on liver pa states    AMPUTATION TOE Right 05/23/2023   Procedure: AMPUTATION TOE;  Surgeon: Malvin Marsa FALCON, DPM;  Location: [facility] OR;  Service: Orthopedics/Podiatry;  Laterality: Right;  Right 2nd toe amputation at MPJ level   BRONCHIAL BIOPSY  06/09/2021   Procedure: BRONCHIAL BIOPSIES;  Surgeon: Brenna Adine CROME, DO;  Location: [facility];  Service: Pulmonary;;   BRONCHIAL NEEDLE ASPIRATION BIOPSY  06/09/2021   Procedure: BRONCHIAL NEEDLE ASPIRATION BIOPSIES;  Surgeon: Brenna Adine CROME, DO;  Location: [facility];  Service: Pulmonary;;   BRONCHIAL WASHINGS  06/09/2021   Procedure: BRONCHIAL WASHINGS;  Surgeon: Brenna Adine CROME, DO;  Location: [facility];  Service: Pulmonary;;   CARDIAC CATHETERIZATION     CATARACT EXTRACTION W/PHACO Left 03/09/2022   Procedure: CATARACT EXTRACTION PHACO AND INTRAOCULAR LENS PLACEMENT (IOC) LEFT DIABETIC 5.16 00:36.6;  Surgeon: Jaye Fallow, MD;  Location: [facility];  Service: Ophthalmology;  Laterality: Left;  Diabetic   EYE SURGERY     HYDROCELE EXCISION / REPAIR     RADIAL KERATOTOMY     RETINAL DETACHMENT SURGERY     VIDEO BRONCHOSCOPY WITH RADIAL ENDOBRONCHIAL ULTRASOUND  06/09/2021   Procedure: VIDEO BRONCHOSCOPY WITH RADIAL ENDOBRONCHIAL ULTRASOUND;  Surgeon: Brenna Adine CROME, DO;  Location: [facility];  Service: Pulmonary;;   Family History  Problem Relation Age of Onset   Hypertension Mother    Asthma Mother    Cancer Father        Bone   Diabetes Father    Hypertension Father    Colon polyps Father    Diabetes Paternal Grandfather    ADD / ADHD Sister    ADD / ADHD Brother     Prostate cancer Neg Hx    Outpatient Medications Prior to Visit  Medication Sig Dispense Refill   acetaminophen  (TYLENOL ) 325 MG tablet Take 2 tablets (650 mg total) by mouth every 6 (six) hours as needed for mild pain (pain score 1-3) (or Fever >/= 101).     ascorbic acid  (VITAMIN C ) 500 MG tablet Take 1 tablet (500 mg total) by mouth daily.     Continuous Glucose Transmitter (DEXCOM G6 TRANSMITTER) MISC USE AS DIRECTED AND CHANGE EVERY 90 DAYS 1 each 0   cyclobenzaprine  (FLEXERIL ) 5 MG tablet TAKE 1 TABLET BY MOUTH EVERYDAY AT BEDTIME 40 tablet 0   diclofenac  Sodium (VOLTAREN ) 1 % GEL Apply 2 g topically 4 (four) times daily. To affected joint. 100 g 5   gabapentin  (NEURONTIN ) 100 MG capsule TAKE 1 CAPSULE(100 MG) BY MOUTH TWICE DAILY 180 capsule 1   glucose blood test strip Use to check blood sugar 3 times a day 300 each 5   hydrochlorothiazide  (HYDRODIURIL ) 25 MG tablet TAKE 1 TABLET(25 MG) BY MOUTH DAILY 90 tablet 1   insulin  glargine (LANTUS ) 100 unit/mL SOPN Inject 40 Units into the skin daily. 15 mL 11   insulin  regular (HUMULIN R ) 100 units/mL injection Inject 0.2-0.3 mLs (20-30 Units total) into the skin 3 (three) times daily before meals. 90 mL 2   Insulin  Syringe-Needle U-100 (RELION INSULIN  SYRINGE) 31G X 15/64 0.5 ML MISC Use to inject insulin  3 times a day. NO REFILLS WITHOUT APPOINTMENT 300 each 0   Lancets (ONETOUCH ULTRASOFT) lancets Use to check blood sugar 3 times a day 100 each 0   lovastatin  (MEVACOR ) 20 MG tablet Take 1 tablet (20 mg total) by mouth at bedtime. TAKE 1 TABLET(20 MG) BY MOUTH AT BEDTIME 90 tablet 1   meloxicam  (MOBIC ) 15 MG tablet Take 1 tablet (15 mg total) by mouth daily as needed for pain. 30 tablet 0   metFORMIN  (GLUCOPHAGE ) 1000 MG tablet TAKE 1 TABLET BY MOUTH TWICE A DAY 180 tablet 3   metoprolol  tartrate (LOPRESSOR ) 100 MG tablet TAKE 1 TABLET(100 MG) BY MOUTH TWICE DAILY 180 tablet 1   omeprazole  (PRILOSEC) 20 MG capsule TAKE 1 CAPSULE BY MOUTH  IN THE MORNING 90 capsule 3   ondansetron  (ZOFRAN ) 4 MG tablet Take 1 tablet (4 mg total) by mouth every 8 (eight) hours as needed for nausea or vomiting. 16 tablet 0   ramipril  (ALTACE ) 10 MG capsule Take 2 capsules (20 mg total) by mouth daily. 180 capsule 3   Semaglutide ,0.25 or 0.5MG /DOS, (OZEMPIC , 0.25 OR 0.5 MG/DOSE,) 2 MG/3ML SOPN Inject 0.25mg  into the skin once weekly for 4 weeks, then increase to 0.5mg  weekly 3 mL 3   tiZANidine  (ZANAFLEX ) 4 MG tablet Take 1 tablet (4 mg total) by mouth at bedtime as needed. 30 tablet 0   zinc  sulfate, 50mg  elemental zinc , 220 (50 Zn) MG capsule Take 1 capsule (220 mg total) by mouth daily.  ALPRAZolam  (XANAX ) 0.5 MG tablet Take 1 tablet (0.5 mg total) by mouth 2 (two) times daily as needed for anxiety. 20 tablet 0   dapagliflozin  propanediol (FARXIGA ) 10 MG TABS tablet Take 1 tablet (10 mg total) by mouth daily before breakfast. 90 tablet 1   Facility-Administered Medications Prior to Visit  Medication Dose Route Frequency Provider Last Rate Last Admin   diclofenac  Sodium (VOLTAREN ) 1 % topical gel 2 g  2 g Topical BID PRN Raymond Katz, DO       diclofenac  Sodium (VOLTAREN ) 1 % topical gel 2 g  2 g Topical Daily PRN Raymond Katz, DO       Allergies  Allergen Reactions   Hydrocodone  Nausea Only   Yellow Dyes (Non-Tartrazine) Hives and Itching   Objective:   Today's Vitals   02/28/24 0849  BP: 124/76  Pulse: 79  Temp: (!) 97 F (36.1 C)  TempSrc: Temporal  SpO2: 97%  Weight: 213 lb (96.6 kg)  Height: 5' 10.5 (1.791 m)   Body mass index is 30.13 kg/m.   General: Well developed, well nourished. No acute distress. Psych: Alert and oriented. Normal mood and affect.  Health Maintenance Due  Topic Date Due   Hepatitis C Screening  Never done   OPHTHALMOLOGY EXAM  06/30/2019     Assessment & Plan:   Problem List Items Addressed This Visit       Cardiovascular and Mediastinum   Essential hypertension - Primary   BP is  at goal today. Continue  HCTZ 25 mg daily, metoprolol  tartrate 100 mg bid, and ramipril  10 mg, 2 tablets daily.        Endocrine   Type 2 diabetes mellitus with peripheral neuropathy (HCC)   Diabetes has been uncontrolled. Weight is down 13 lbs since starting on Ozempic . I will check an A1c today. He will continue to follow with Dr. Trixie. Continue metformin  1,000 mg bid, insulin  glargine (Lantus ) 30 units at bedtime, Novulin R on a sliding scale, and semaglutide  (Ozempic ) 0.5 mg weekly. Strongly encouraged follow-up for ophthalmology exam for diabetes screening and monitoring of other eye disease issues he has had chronically.      Relevant Medications   ALPRAZolam  (XANAX ) 0.5 MG tablet   Other Relevant Orders   Glucose, random   Hemoglobin A1c     Other   Hyperlipidemia   Lipids are at goal. Continue lovastatin  20 mg daily.      Situational anxiety   Uses episodic alprazolam  0.5 mg 1/2 to 1 tablet for episodes of anxiety.  Takes this about once a month.      Relevant Medications   ALPRAZolam  (XANAX ) 0.5 MG tablet   Other Visit Diagnoses       Encounter for hepatitis C screening test for low risk patient       Relevant Orders   HCV Ab w Reflex to Quant PCR       Return in about 3 months (around 05/30/2024) for Reassessment.   Garnette CHRISTELLA Simpler, MD

## 2024-02-28 NOTE — Assessment & Plan Note (Signed)
 BP is at goal today. Continue  HCTZ 25 mg daily, metoprolol  tartrate 100 mg bid, and ramipril  10 mg, 2 tablets daily.

## 2024-02-28 NOTE — Assessment & Plan Note (Signed)
 Uses episodic alprazolam  0.5 mg 1/2 to 1 tablet for episodes of anxiety.  Takes this about once a month.

## 2024-02-28 NOTE — Assessment & Plan Note (Addendum)
 Diabetes has been uncontrolled. Weight is down 13 lbs since starting on Ozempic . I will check an A1c today. He will continue to follow with Dr. Trixie. Continue metformin  1,000 mg bid, insulin  glargine (Lantus ) 30 units at bedtime, Novulin R on a sliding scale, and semaglutide  (Ozempic ) 0.5 mg weekly. Strongly encouraged follow-up for ophthalmology exam for diabetes screening and monitoring of other eye disease issues he has had chronically.

## 2024-02-28 NOTE — Assessment & Plan Note (Signed)
 Lipids are at goal. Continue lovastatin  20 mg daily.

## 2024-02-29 LAB — HCV INTERPRETATION

## 2024-02-29 LAB — HCV AB W REFLEX TO QUANT PCR: HCV Ab: NONREACTIVE

## 2024-03-07 ENCOUNTER — Ambulatory Visit: Admitting: Podiatry

## 2024-03-07 DIAGNOSIS — S98131D Complete traumatic amputation of one right lesser toe, subsequent encounter: Secondary | ICD-10-CM

## 2024-03-07 DIAGNOSIS — M216X1 Other acquired deformities of right foot: Secondary | ICD-10-CM

## 2024-03-07 DIAGNOSIS — E1149 Type 2 diabetes mellitus with other diabetic neurological complication: Secondary | ICD-10-CM

## 2024-03-07 DIAGNOSIS — E1140 Type 2 diabetes mellitus with diabetic neuropathy, unspecified: Secondary | ICD-10-CM | POA: Diagnosis not present

## 2024-03-07 DIAGNOSIS — M216X2 Other acquired deformities of left foot: Secondary | ICD-10-CM | POA: Diagnosis not present

## 2024-03-07 NOTE — Progress Notes (Signed)
 Subjective:   Patient ID: Raymond Pugh, male   DOB: 63 y.o.   MRN: [id number]   HPI Patient presents stating that he has chronic diabetes neurological issues has had a loss of second toe has stress on his plantar feet bilateral with structural damage to both feet and histories of ulceration bilateral   ROS      Objective:  Physical Exam  Vascular status mildly diminished but intact neurologically significant diminishment of sharp dull vibratory with the patient noted to have significant structural damage to both feet with loss of second toe right keratotic tissue which caused ulceration     Assessment:  High risk patient with structural malalignment of both feet pressure points that become very painful with loss of second toe right     Plan:  H&P reviewed I did do courtesy debridement of tissue today I discussed structural abnormalities and patient is casted for functional orthotic devices to offload weight off the lesser MPJs and the feet and try to offload the structural deformity with his diabetic neuropathy.  Patient will be seen back when done

## 2024-03-07 NOTE — Progress Notes (Signed)
 Orthotics   Patient was present and evaluated for Custom molded foot orthotics. Patient will benefit from CFO's to provide total contact to BIL MLA's helping to balance and distribute body weight more evenly across BIL feet helping to reduce plantar pressure and pain. Orthotic will also encourage FF / RF alignment  Patient was scanned today and will return for fitting upon receipt  Spacer added right 2nd digit missing zote top cover to prevent friction sheer and pressure patient is diabetic

## 2024-03-29 ENCOUNTER — Other Ambulatory Visit: Payer: Self-pay

## 2024-03-29 ENCOUNTER — Other Ambulatory Visit: Payer: Self-pay | Admitting: Internal Medicine

## 2024-03-29 DIAGNOSIS — E119 Type 2 diabetes mellitus without complications: Secondary | ICD-10-CM

## 2024-03-29 DIAGNOSIS — F418 Other specified anxiety disorders: Secondary | ICD-10-CM

## 2024-03-29 MED ORDER — OZEMPIC (0.25 OR 0.5 MG/DOSE) 2 MG/3ML ~~LOC~~ SOPN: 0.5000 mg | PEN_INJECTOR | SUBCUTANEOUS | 3 refills | Status: AC

## 2024-03-29 MED ORDER — INSULIN GLARGINE 100 UNITS/ML SOLOSTAR PEN: 40.0000 [IU] | PEN_INJECTOR | Freq: Every day | SUBCUTANEOUS | 11 refills | Status: AC

## 2024-03-29 MED ORDER — DAPAGLIFLOZIN PROPANEDIOL 10 MG PO TABS: 10.0000 mg | ORAL_TABLET | Freq: Every day | ORAL | 1 refills | Status: AC

## 2024-03-29 MED ORDER — FREESTYLE LIBRE 3 PLUS SENSOR MISC: 1.0000 | 3 refills | Status: DC

## 2024-03-29 MED ORDER — FREESTYLE LIBRE 3 READER DEVI
0 refills | Status: AC
Start: 2024-03-29 — End: ?

## 2024-03-29 MED ORDER — METFORMIN HCL 1000 MG PO TABS: 1000.0000 mg | ORAL_TABLET | Freq: Two times a day (BID) | ORAL | 3 refills | Status: AC

## 2024-03-29 MED ORDER — INSULIN REGULAR HUMAN 100 UNIT/ML IJ SOLN: 20.0000 [IU] | Freq: Three times a day (TID) | INTRAMUSCULAR | 2 refills | Status: DC

## 2024-03-29 NOTE — Telephone Encounter (Signed)
 Called and spoke to patients wife, Olam.  He is gonna need a refill on his Alprazolam  0.5 mg.  LR  02/28/24, #20, 0 rf LOV  02/28/24 FOV 08/28/24  Please review and advise.  Thanks. Dm/cma

## 2024-03-29 NOTE — Telephone Encounter (Signed)
 Copied from CRM #[id number]. Topic: Clinical - Prescription Issue >> Mar 29, 2024  9:01 AM Roselie BROCKS wrote: Reason for CRM: Patient will be dropped from [facility] November 30th and is really needing a provider to approve 90 day supply of all medications so it will be covered but patient is dropped. And requests a call back to speak about this. Or mychart.

## 2024-03-30 NOTE — Telephone Encounter (Signed)
 Will call them on Monday.  Dm/cma

## 2024-04-04 ENCOUNTER — Ambulatory Visit

## 2024-04-04 NOTE — Progress Notes (Signed)
 Patient presents today to pick up custom molded foot orthotics, diagnosed with BIL foot deformitites by Dr. Magdalen.   Orthotics were dispensed and fit was satisfactory. Reviewed instructions for break-in and wear. Written instructions given to patient.  Patient will follow up as needed.  Lolita Schultze Cped

## 2024-04-20 ENCOUNTER — Other Ambulatory Visit: Payer: Self-pay | Admitting: Sports Medicine

## 2024-04-20 DIAGNOSIS — G8929 Other chronic pain: Secondary | ICD-10-CM

## 2024-04-20 DIAGNOSIS — M51362 Other intervertebral disc degeneration, lumbar region with discogenic back pain and lower extremity pain: Secondary | ICD-10-CM

## 2024-04-22 ENCOUNTER — Other Ambulatory Visit: Payer: Self-pay | Admitting: Internal Medicine

## 2024-04-22 DIAGNOSIS — E1165 Type 2 diabetes mellitus with hyperglycemia: Secondary | ICD-10-CM

## 2024-05-09 ENCOUNTER — Other Ambulatory Visit: Payer: Self-pay | Admitting: Sports Medicine

## 2024-05-09 ENCOUNTER — Telehealth: Payer: Self-pay | Admitting: Sports Medicine

## 2024-05-09 DIAGNOSIS — M51362 Other intervertebral disc degeneration, lumbar region with discogenic back pain and lower extremity pain: Secondary | ICD-10-CM

## 2024-05-09 DIAGNOSIS — G8929 Other chronic pain: Secondary | ICD-10-CM

## 2024-05-09 DIAGNOSIS — M25552 Pain in left hip: Secondary | ICD-10-CM

## 2024-05-09 MED ORDER — TRAMADOL HCL 50 MG PO TABS: 50.0000 mg | ORAL_TABLET | Freq: Three times a day (TID) | ORAL | 0 refills | Status: AC | PRN

## 2024-05-09 MED ORDER — CYCLOBENZAPRINE HCL 5 MG PO TABS: 5.0000 mg | ORAL_TABLET | Freq: Every evening | ORAL | 0 refills | Status: DC | PRN

## 2024-05-09 NOTE — Telephone Encounter (Signed)
 Patient will need a prescription sent in for Tramadol  and Flexeril .

## 2024-05-09 NOTE — Telephone Encounter (Addendum)
 Patient called and would like to see about getting that injection in his back expedited as he is in a lot of pain and can not hardly get up out of the floor. Patient does not think that he can wait two weeks. Patient states he may have to go to the hospital and can't move and may not be able to come tomorrow. He states that he does not have any type of medication and would like to go straight to getting the injection. Please advise.

## 2024-05-09 NOTE — Progress Notes (Signed)
 Refill of Flexeril  5 mg nightly as needed for muscle spasms, and tramadol  50 mg every 8 hours as needed for severe breakthrough pain provided due to patient's recurrent flare of back pain

## 2024-05-09 NOTE — Telephone Encounter (Signed)
 Epidural placed via mychart

## 2024-05-09 NOTE — Progress Notes (Unsigned)
 [facility] [address] [place] [address] Phone: [phone]   Assessment and Plan:     1. Chronic bilateral low back pain with right-sided sciatica (Primary) -Chronic with exacerbation, subsequent visit - Recurrent low back pain with now right sided radicular symptoms that progressed over the past 1 month - Patient had significant 100% improvement after epidural CSI on 08/26/23 with decreased need for pain medication and improved function.  That pain relief lasted for 7+ months till recurrence over the past 1 month - Recommend repeat epidural CSI, now at right sided L4-L5.  Order placed -Continue Tylenol  500 to 1000 mg tablets 2-3 times a day for day-to-day pain relief  -Continue ibuprofen 400 mg every 6 hours as needed for breakthrough pain  - Continue Flexeril  5 to 10 mg nightly as needed for muscle spasms  - Continue tramadol  50 mg every 8 hours as needed for severe breakthrough pain   2. Bilateral carpal tunnel syndrome -Chronic with exacerbation, initial visit - Patient presents with recurrent flare of bilateral numbness/tingling/pain primarily in digits 1 through 3, consistent with bilateral carpal tunnel syndrome - Patient elected for bilateral carpal tunnel CSI.  Tolerated well per note below - May use carpal tunnel wrist braces  Procedure: Ultrasound Guided Carpal Tunnel Injection Side: Bilateral Diagnosis: Carpal tunnel syndrome US  Indication:  - accuracy is paramount for diagnosis - to ensure therapeutic efficacy or procedural success - to reduce procedural risk  After explaining the procedure, viable alternatives, risks, and answering any questions, consent was given verbally. The site was cleaned with chlorhexidine  prep. An ultrasound transducer was placed on the volar aspect of the wrist.  The median nerve was identified.   An injection was performed under ultrasound guidance from the ulnar approach with care  used to avoid ulnar artery.  1ml of 1% lidocaine  without epinephrine  was used to hydrodissect the median nerve from the retinaculum.  40 mg of triamcinolone  (KENALOG ) 40mg /ml was then deposited adjacent to but not into the median nerve.  This was well tolerated and resulted in  relief.  Needle was removed and dressing placed and post injection instructions were given including  a discussion of likely return of pain today after the anesthetic wears off (with the possibility of worsened pain) until the steroid starts to work in 1-3 days.  Procedure was repeated on contralateral side.  Pt was advised to call or return to clinic if these symptoms worsen or fail to improve as anticipated. Images permanently stored.    Pertinent previous records reviewed include none   Follow Up: 2 weeks after epidural CSI to review benefit.  Could consider repeat epidural if necessary.  Could provide HEP for carpal tunnel at follow-up visit if necessary   Subjective:   I, Raymond Pugh am a scribe for Dr. Leonce.      Chief Complaint: neck pain    HPI:    05/03/2023 Patient is a 63 year old male with neck pain. Patient states he went to place called jump and he went down a slide. This was back in march, he had a form of whiplash. Decreased ROM. Has been using tiger balm and all kinds of rubs none of that worked. Meds dont work for the pain for long periods of time. He isnt able to turn his neck when driving    8/75/7974 Patient states meloxciam helped , when stops the medicine the pain comes back    07/15/2023 Patient states neck  is better. Hip and low back pain . He had a slip and the fall in the snow 2 weeks ago, the pain just started bothering him. States his hip popped out when he fell and just popped In yesterday.    08/05/2023 Patient states he is still walking with limp. Pain is still in the hip. States he is eating ibu    08/17/2023 Patient states it has gotten worse. It gotten worse. Hurting in the  hip area and lower back.   05/10/2024 Patient states back pain has come back we sent everything in yesterday. Pain is radiating down the right side now . Patient also notes he has bilat carpal tunnel now and has decreased grip strength   Relevant Historical Information:   DM type II, Barrett's esophagus    Additional pertinent review of systems negative.  Current Medications[1]   Objective:     Vitals:   05/10/24 0930  Pulse: 67  SpO2: 99%  Weight: 213 lb (96.6 kg)  Height: 5' 10 (1.778 m)      Body mass index is 30.56 kg/m.    Physical Exam:    General: Hunched over in acute pain.  Complaining of low back pain with right-sided radicular symptoms.  Difficulty staying in 1 position or sitting due to subjective complaints of pain HEENT: Normocephalic, atraumatic.   Neck: No gross abnormality.  Cardiovascular: No pallor or cyanosis. Resp: Comfortable WOB.   Abdomen: Non distended.   Skin: Warm and dry; no focal rashes identified on limited exam. Extremities: No cyanosis or edema.  Neuro: Gross motor and sensory intact. Gait antalgic.   Psychiatric: Mood and affect are appropriate.     Bilateral hand/Wrist:   No deformity or swelling appreciated. ROM  Ext 90, flexion 70, radial/ulnar deviation 30 nttp over the snuff box, dorsal carpals, volar carpals, radial styloid, ulnar styloid, 1st mcp, tfcc Positive Tinel's, Phalen's, Prayer Tests  No pain with resisted ext, flex or deviation   Electronically signed by:  Raymond Pugh [facility] 9:56 AM 05/10/2024     [1]  Current Outpatient Medications:    acetaminophen  (TYLENOL ) 325 MG tablet, Take 2 tablets (650 mg total) by mouth every 6 (six) hours as needed for mild pain (pain score 1-3) (or Fever >/= 101)., Disp: , Rfl:    ALPRAZolam  (XANAX ) 0.5 MG tablet, Take 1 tablet (0.5 mg total) by mouth 2 (two) times daily as needed for anxiety., Disp: 20 tablet, Rfl: 0   ascorbic acid  (VITAMIN C ) 500 MG  tablet, Take 1 tablet (500 mg total) by mouth daily., Disp: , Rfl:    Continuous Glucose Receiver (FREESTYLE LIBRE 3 READER) DEVI, Use to monitor glucose continuously, Disp: 1 each, Rfl: 0   Continuous Glucose Sensor (FREESTYLE LIBRE 3 PLUS SENSOR) MISC, Inject 1 Device into the skin continuous. Change every 15 days, Disp: 6 each, Rfl: 3   Continuous Glucose Transmitter (DEXCOM G6 TRANSMITTER) MISC, USE AS DIRECTED AND CHANGE EVERY 90 DAYS, Disp: 1 each, Rfl: 0   cyclobenzaprine  (FLEXERIL ) 5 MG tablet, Take 1 tablet (5 mg total) by mouth at bedtime as needed for muscle spasms., Disp: 30 tablet, Rfl: 0   dapagliflozin  propanediol (FARXIGA ) 10 MG TABS tablet, Take 1 tablet (10 mg total) by mouth daily before breakfast., Disp: 90 tablet, Rfl: 1   diclofenac  Sodium (VOLTAREN ) 1 % GEL, APPLY 2 G TOPICALLY 4 (FOUR) TIMES DAILY. TO AFFECTED JOINT., Disp: 100 g, Rfl: 5   gabapentin  (NEURONTIN ) 100 MG capsule,  TAKE 1 CAPSULE(100 MG) BY MOUTH TWICE DAILY, Disp: 180 capsule, Rfl: 1   glucose blood test strip, Use to check blood sugar 3 times a day, Disp: 300 each, Rfl: 5   hydrochlorothiazide  (HYDRODIURIL ) 25 MG tablet, TAKE 1 TABLET(25 MG) BY MOUTH DAILY, Disp: 90 tablet, Rfl: 1   insulin  glargine (LANTUS ) 100 unit/mL SOPN, Inject 40 Units into the skin daily., Disp: 15 mL, Rfl: 11   insulin  regular (HUMULIN R ) 100 units/mL injection, Inject 0.2-0.3 mLs (20-30 Units total) into the skin 3 (three) times daily before meals., Disp: 90 mL, Rfl: 2   Insulin  Syringe-Needle U-100 (RELION INSULIN  SYRINGE) 31G X 15/64 0.5 ML MISC, Use to inject insulin  3 times a day. NO REFILLS WITHOUT APPOINTMENT, Disp: 300 each, Rfl: 0   Lancets (ONETOUCH ULTRASOFT) lancets, Use to check blood sugar 3 times a day, Disp: 100 each, Rfl: 0   lovastatin  (MEVACOR ) 20 MG tablet, Take 1 tablet (20 mg total) by mouth at bedtime. TAKE 1 TABLET(20 MG) BY MOUTH AT BEDTIME, Disp: 90 tablet, Rfl: 1   meloxicam  (MOBIC ) 15 MG tablet, Take 1 tablet  (15 mg total) by mouth daily as needed for pain., Disp: 30 tablet, Rfl: 0   metFORMIN  (GLUCOPHAGE ) 1000 MG tablet, Take 1 tablet (1,000 mg total) by mouth 2 (two) times daily., Disp: 180 tablet, Rfl: 3   metoprolol  tartrate (LOPRESSOR ) 100 MG tablet, TAKE 1 TABLET(100 MG) BY MOUTH TWICE DAILY, Disp: 180 tablet, Rfl: 1   omeprazole  (PRILOSEC) 20 MG capsule, TAKE 1 CAPSULE BY MOUTH IN THE MORNING, Disp: 90 capsule, Rfl: 3   ondansetron  (ZOFRAN ) 4 MG tablet, Take 1 tablet (4 mg total) by mouth every 8 (eight) hours as needed for nausea or vomiting., Disp: 16 tablet, Rfl: 0   ramipril  (ALTACE ) 10 MG capsule, Take 2 capsules (20 mg total) by mouth daily., Disp: 180 capsule, Rfl: 3   Semaglutide ,0.25 or 0.5MG /DOS, (OZEMPIC , 0.25 OR 0.5 MG/DOSE,) 2 MG/3ML SOPN, Inject 0.5 mg into the skin once a week., Disp: 9 mL, Rfl: 3   tiZANidine  (ZANAFLEX ) 4 MG tablet, Take 1 tablet (4 mg total) by mouth at bedtime as needed., Disp: 30 tablet, Rfl: 0   traMADol  (ULTRAM ) 50 MG tablet, Take 1 tablet (50 mg total) by mouth every 8 (eight) hours as needed for up to 5 days., Disp: 15 tablet, Rfl: 0   zinc  sulfate, 50mg  elemental zinc , 220 (50 Zn) MG capsule, Take 1 capsule (220 mg total) by mouth daily., Disp: , Rfl:   Current Facility-Administered Medications:    diclofenac  Sodium (VOLTAREN ) 1 % topical gel 2 g, 2 g, Topical, BID PRN, Raymond Pugh Katz, DO   diclofenac  Sodium (VOLTAREN ) 1 % topical gel 2 g, 2 g, Topical, Daily PRN, Raymond Pugh Katz, DO

## 2024-05-10 ENCOUNTER — Other Ambulatory Visit: Payer: Self-pay

## 2024-05-10 ENCOUNTER — Ambulatory Visit: Admitting: Internal Medicine

## 2024-05-10 ENCOUNTER — Ambulatory Visit: Admitting: Sports Medicine

## 2024-05-10 VITALS — HR 67 | Ht 70.0 in | Wt 213.0 lb

## 2024-05-10 DIAGNOSIS — G8929 Other chronic pain: Secondary | ICD-10-CM | POA: Diagnosis not present

## 2024-05-10 DIAGNOSIS — G5603 Carpal tunnel syndrome, bilateral upper limbs: Secondary | ICD-10-CM

## 2024-05-10 DIAGNOSIS — M5441 Lumbago with sciatica, right side: Secondary | ICD-10-CM

## 2024-05-10 NOTE — Patient Instructions (Addendum)
 Thank you for coming in today  For pain relief use the following treatment plan: -Continue Tylenol  500 to 1000 mg tablets 2-3 times a day for day-to-day pain relief  -Continue ibuprofen 400 mg every 6 hours as needed for breakthrough pain  - Continue Flexeril  5 to 10 mg nightly as needed for muscle spasms  - Continue tramadol  50 mg every 8 hours as needed for severe breakthrough pain    We performed bilateral carpal tunnel corticosteroid injections today in clinic.  You may experience numbness and tingling for the remainder of today.  Steroid will kick in in 1 to 2 days and you should feel gradual improvement over the next 2 weeks.  We ordered an epidural steroid injection to be performed in your back.  [facility] will be reaching out to schedule this appointment.  As soon as you know your epidural injection date, please contact our clinic and schedule a follow-up for 2 weeks after the injection date to review the benefits.

## 2024-05-21 ENCOUNTER — Telehealth: Payer: Self-pay | Admitting: Sports Medicine

## 2024-05-21 NOTE — Telephone Encounter (Signed)
 Patient called stating that an epidural was ordered but [facility] is not in network.  He has [employer] and they state that [facility] is in network. Can the order be sent to another facility?  They were hoping to have this done before the end of the year due to insurance benefits.  Please advise.

## 2024-05-21 NOTE — Telephone Encounter (Signed)
 fax: [phone]  phone: [phone] Epidurals are scheduled at the main campus at the hospital through the IR department. They would need the order, demographics, insurance cards and auth if required. (They do not do auths for outside offices)

## 2024-05-21 NOTE — Telephone Encounter (Signed)
 Patient aware

## 2024-05-22 ENCOUNTER — Encounter: Payer: Self-pay | Admitting: Sports Medicine

## 2024-05-22 NOTE — Telephone Encounter (Signed)
 Left message informing patient.

## 2024-05-22 NOTE — Telephone Encounter (Signed)
 Insurance required more information, letter written and uploaded to [employer] for further review

## 2024-05-22 NOTE — Telephone Encounter (Signed)
 Received authorization of the epidural and faxed to the fax number that was given by the [facility] facility we could find that would do an epidural injection without seeing a pain management doctor first.   [phone]

## 2024-05-25 NOTE — Telephone Encounter (Signed)
 Faxed order to the new fax number

## 2024-05-25 NOTE — Telephone Encounter (Signed)
 Patient called stating that the order was not received at [facility]. They asked if it could be resent? Also, if it needs another auth, they still have [facility].  Patient gave me a different fax number. Fax # [phone]

## 2024-05-29 ENCOUNTER — Encounter: Payer: Self-pay | Admitting: Family Medicine

## 2024-05-29 ENCOUNTER — Other Ambulatory Visit: Payer: Self-pay | Admitting: Physical Medicine and Rehabilitation

## 2024-05-29 DIAGNOSIS — M545 Low back pain, unspecified: Secondary | ICD-10-CM

## 2024-05-29 DIAGNOSIS — M25552 Pain in left hip: Secondary | ICD-10-CM

## 2024-05-29 DIAGNOSIS — M5030 Other cervical disc degeneration, unspecified cervical region: Secondary | ICD-10-CM

## 2024-05-29 DIAGNOSIS — R296 Repeated falls: Secondary | ICD-10-CM

## 2024-05-29 DIAGNOSIS — M542 Cervicalgia: Secondary | ICD-10-CM

## 2024-05-29 DIAGNOSIS — G8929 Other chronic pain: Secondary | ICD-10-CM

## 2024-05-29 DIAGNOSIS — G5603 Carpal tunnel syndrome, bilateral upper limbs: Secondary | ICD-10-CM

## 2024-05-29 DIAGNOSIS — M51362 Other intervertebral disc degeneration, lumbar region with discogenic back pain and lower extremity pain: Secondary | ICD-10-CM

## 2024-05-29 NOTE — Telephone Encounter (Signed)
 Patient's wife called stating that [facility] is now telling them that [facility] is not in network. But [facility] would be. Can we refer him to someone in [facility]? I think Dr Eldonna?  Please advise.   Patient's wife can be reached at [phone]

## 2024-05-29 NOTE — Progress Notes (Signed)
"  Referral placed   "

## 2024-05-29 NOTE — Telephone Encounter (Signed)
 Patient's wife informed of referral and gave her their office number.

## 2024-05-29 NOTE — Telephone Encounter (Signed)
"  Referral placed   "

## 2024-05-30 ENCOUNTER — Telehealth: Payer: Self-pay | Admitting: Physical Medicine and Rehabilitation

## 2024-05-30 NOTE — Telephone Encounter (Signed)
Patient called. He would like an appointment with Dr. Newton.  

## 2024-06-01 NOTE — Telephone Encounter (Signed)
 Can you place the referral to the Audiologist for patient?  Pleas review and advise.  Thanks. Dm/cma

## 2024-06-05 ENCOUNTER — Encounter: Payer: Self-pay | Admitting: Internal Medicine

## 2024-06-06 ENCOUNTER — Telehealth: Payer: Self-pay

## 2024-06-06 ENCOUNTER — Ambulatory Visit: Payer: Self-pay | Admitting: Physical Medicine and Rehabilitation

## 2024-06-06 ENCOUNTER — Other Ambulatory Visit (HOSPITAL_COMMUNITY): Payer: Self-pay

## 2024-06-06 ENCOUNTER — Other Ambulatory Visit: Payer: Self-pay

## 2024-06-06 VITALS — BP 158/90 | HR 76

## 2024-06-06 DIAGNOSIS — M5416 Radiculopathy, lumbar region: Secondary | ICD-10-CM

## 2024-06-06 MED ORDER — METHYLPREDNISOLONE ACETATE 40 MG/ML IJ SUSP
40.0000 mg | Freq: Once | INTRAMUSCULAR | Status: AC
  Administered 2024-06-06: 40 mg

## 2024-06-06 NOTE — Telephone Encounter (Signed)
 Pt needs a Prior Auth for Dexcom G7

## 2024-06-06 NOTE — Telephone Encounter (Signed)
 Pharmacy Patient Advocate Encounter   Received notification from Pt Calls Messages that prior authorization for Dexcom G7 sensor is required/requested.   Insurance verification completed.   The patient is insured through [employer].   Per test claim: PA required; PA submitted to above mentioned insurance via Latent Key/confirmation #/EOC [id number] Status is pending

## 2024-06-06 NOTE — Progress Notes (Signed)
 "  Raymond Pugh - 64 y.o. male MRN [id number]  Date of birth: 14-Dec-1960  Office Visit Note: Visit Date: 06/06/2024 PCP: Thedora Garnette HERO, MD Referred by: Thedora Garnette HERO, MD  Subjective: Chief Complaint  Patient presents with   Lower Back - Pain   HPI:  Raymond Pugh is a 64 y.o. male who comes in today at the request of Dr. Morene Mace for planned Right L4-5 Lumbar Interlaminar epidural steroid injection with fluoroscopic guidance.  The patient has failed conservative care including home exercise, medications, time and activity modification.  This injection will be diagnostic and hopefully therapeutic.  Please see requesting physician notes for further details and justification.  Patient having pretty classic right L5 radicular symptoms with claudication and worse with standing and ambulating.  Prior left-sided L4-5 interlaminar epidural steroid injection at [facility] imaging was successful last year.  His hemoglobin A1c is quite high but it is below 10 which is our cutoff for doing steroid injections.  Discussed with him injection could make the blood sugar fluctuate.  Also mention the usefulness of lumbar laminectomy in 1 level stenosis.   ROS Otherwise per HPI.  Assessment & Plan: Visit Diagnoses:    ICD-10-CM   1. Lumbar radiculopathy  M54.16 XR C-ARM NO REPORT    Epidural Steroid injection    methylPREDNISolone  acetate (DEPO-MEDROL ) injection 40 mg      Plan: No additional findings.   Meds & Orders:  Meds ordered this encounter  Medications   methylPREDNISolone  acetate (DEPO-MEDROL ) injection 40 mg    Orders Placed This Encounter  Procedures   XR C-ARM NO REPORT   Epidural Steroid injection    Follow-up: Return for visit to requesting provider as needed.   Procedures: No procedures performed  Lumbar Epidural Steroid Injection - Interlaminar Approach with Fluoroscopic Guidance  Patient: Raymond Pugh      Date of Birth: September 18, 1960 MRN: [id number] PCP:  Thedora Garnette HERO, MD      Visit Date: 06/06/2024   Universal Protocol:     Consent Given By: the patient  Position: PRONE  Additional Comments: Vital signs were monitored before and after the procedure. Patient was prepped and draped in the usual sterile fashion. The correct patient, procedure, and site was verified.   Injection Procedure Details:   Procedure diagnoses: Lumbar radiculopathy [M54.16]   Meds Administered:  Meds ordered this encounter  Medications   methylPREDNISolone  acetate (DEPO-MEDROL ) injection 40 mg     Laterality: Right  Location/Site:  L4-5  Needle: 3.5 in., 20 ga. Tuohy  Needle Placement: Paramedian epidural  Findings:   -Comments: Excellent flow of contrast into the epidural space.  Procedure Details: Using a paramedian approach from the side mentioned above, the region overlying the inferior lamina was localized under fluoroscopic visualization and the soft tissues overlying this structure were infiltrated with 4 ml. of 1% Lidocaine  without Epinephrine . The Tuohy needle was inserted into the epidural space using a paramedian approach.   The epidural space was localized using loss of resistance along with counter oblique bi-planar fluoroscopic views.  After negative aspirate for air, blood, and CSF, a 2 ml. volume of Isovue -250 was injected into the epidural space and the flow of contrast was observed. Radiographs were obtained for documentation purposes.    The injectate was administered into the level noted above.   Additional Comments:  The patient tolerated the procedure well Dressing: 2 x 2 sterile gauze and Band-Aid    Post-procedure details: Patient was observed during  the procedure. Post-procedure instructions were reviewed.  Patient left the clinic in stable condition.   Clinical History: CLINICAL DATA:  Low back pain radiating down the left leg over the last few months. Fell on cyst no and ice   EXAM: MRI LUMBAR SPINE  WITHOUT CONTRAST   TECHNIQUE: Multiplanar, multisequence MR imaging of the lumbar spine was performed. No intravenous contrast was administered.   COMPARISON:  Radiography 07/15/2023   FINDINGS: Segmentation:  5 lumbar type vertebral bodies.   Alignment:  No malalignment.   Vertebrae: No regional fracture. 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 Benign appearing hemangioma within the T12 vertebral body.   Conus medullaris and cauda equina: Conus extends to the T12-L1 level. Conus and cauda equina appear normal.   Paraspinal and other soft tissues: Negative   Disc levels:   No significant disc level finding from T11-12 through L2-3.   L3-4: Mild bulging of the disc. Mild narrowing of the right lateral recess but no likely neural compression.   L4-5: Shallow broad-based disc herniation with caudal migration of a fragment in the left lateral recess. Facet and ligamentous hypertrophy. Stenosis of both lateral recesses which could cause neural compression on either or both sides. Particular potential for compression of the left L5 nerve because of the disc fragment.   L5-S1: Mild bulging of the disc.  No canal or foraminal stenosis.   IMPRESSION: 1. L4-5: Shallow broad-based disc herniation with caudal migration of a fragment in the left lateral recess. Facet and ligamentous hypertrophy. Stenosis of both lateral recesses which could cause neural compression on either or both sides. Particular potential for compression of the left L5 nerve because of the disc fragment. 2. L3-4: Mild bulging of the disc. Mild narrowing of the right lateral recess but no likely neural compression. 3. L5-S1: Mild bulging of the disc. No canal or foraminal stenosis.     Electronically Signed   By: Oneil Officer M.D.   On: 08/17/2023 09:21     Objective:  VS:  HT:    WT:   BMI:     BP:(!) 158/90  HR:76bpm  TEMP: ( )  RESP:  Physical Exam Vitals  and nursing note reviewed.  Constitutional:      General: He is not in acute distress.    Appearance: Normal appearance. He is not ill-appearing.  HENT:     Head: Normocephalic and atraumatic.     Right Ear: External ear normal.     Left Ear: External ear normal.     Nose: No congestion.  Eyes:     Extraocular Movements: Extraocular movements intact.  Cardiovascular:     Rate and Rhythm: Normal rate.     Pulses: Normal pulses.  Pulmonary:     Effort: Pulmonary effort is normal. No respiratory distress.  Abdominal:     General: There is no distension.     Palpations: Abdomen is soft.  Musculoskeletal:        General: No tenderness or signs of injury.     Cervical back: Neck supple.     Right lower leg: No edema.     Left lower leg: No edema.     Comments: Patient has good distal strength without clonus.  Skin:    Findings: No erythema or rash.  Neurological:     General: No focal deficit present.     Mental Status: He is alert and oriented to person, place, and time.  Sensory: No sensory deficit.     Motor: No weakness or abnormal muscle tone.     Coordination: Coordination normal.  Psychiatric:        Mood and Affect: Mood normal.        Behavior: Behavior normal.      Imaging: XR C-ARM NO REPORT Result Date: 06/06/2024 Please see Notes tab for imaging impression.  "

## 2024-06-06 NOTE — Progress Notes (Signed)
 Pain Scale   Average Pain 8-9    Right L4-L5 IL    +Driver, -BT, -Dye Allergies. Allergy to yellow dye

## 2024-06-06 NOTE — Telephone Encounter (Signed)
 Pharmacy Patient Advocate Encounter   Received notification from [employer] that prior authorization for Ozempic  (0.25 or 0.5 MG/DOSE) 2MG /3ML pen-injectors is required/requested.   Insurance verification completed.   The patient is insured through [employer].   Per test claim: PA required; PA submitted to above mentioned insurance via Latent Key/confirmation #/EOC [id number] Status is pending

## 2024-06-06 NOTE — Procedures (Signed)
 Lumbar Epidural Steroid Injection - Interlaminar Approach with Fluoroscopic Guidance  Patient: Raymond Pugh      Date of Birth: 02-18-1961 MRN: [id number] PCP: Thedora Garnette HERO, MD      Visit Date: 06/06/2024   Universal Protocol:     Consent Given By: the patient  Position: PRONE  Additional Comments: Vital signs were monitored before and after the procedure. Patient was prepped and draped in the usual sterile fashion. The correct patient, procedure, and site was verified.   Injection Procedure Details:   Procedure diagnoses: Lumbar radiculopathy [M54.16]   Meds Administered:  Meds ordered this encounter  Medications   methylPREDNISolone  acetate (DEPO-MEDROL ) injection 40 mg     Laterality: Right  Location/Site:  L4-5  Needle: 3.5 in., 20 ga. Tuohy  Needle Placement: Paramedian epidural  Findings:   -Comments: Excellent flow of contrast into the epidural space.  Procedure Details: Using a paramedian approach from the side mentioned above, the region overlying the inferior lamina was localized under fluoroscopic visualization and the soft tissues overlying this structure were infiltrated with 4 ml. of 1% Lidocaine  without Epinephrine . The Tuohy needle was inserted into the epidural space using a paramedian approach.   The epidural space was localized using loss of resistance along with counter oblique bi-planar fluoroscopic views.  After negative aspirate for air, blood, and CSF, a 2 ml. volume of Isovue -250 was injected into the epidural space and the flow of contrast was observed. Radiographs were obtained for documentation purposes.    The injectate was administered into the level noted above.   Additional Comments:  The patient tolerated the procedure well Dressing: 2 x 2 sterile gauze and Band-Aid    Post-procedure details: Patient was observed during the procedure. Post-procedure instructions were reviewed.  Patient left the clinic in stable  condition.

## 2024-06-11 ENCOUNTER — Other Ambulatory Visit (HOSPITAL_COMMUNITY): Payer: Self-pay

## 2024-06-11 NOTE — Telephone Encounter (Signed)
 Pharmacy Patient Advocate Encounter  Received notification from [employer] that Prior Authorization for Dexcom G7 sensor  has been APPROVED from 06/09/24 to 06/09/25. Ran test claim, Copay is $374.44. This test claim was processed through [facility]- copay amounts may vary at other pharmacies due to pharmacy/plan contracts, or as the patient moves through the different stages of their insurance plan.   PA #/Case ID/Reference #: [id number]

## 2024-06-13 ENCOUNTER — Telehealth: Payer: Self-pay | Admitting: Internal Medicine

## 2024-06-13 ENCOUNTER — Ambulatory Visit: Admitting: Internal Medicine

## 2024-06-13 MED ORDER — DEXCOM G7 SENSOR MISC: 3 refills | Status: AC

## 2024-06-13 NOTE — Telephone Encounter (Signed)
 MEDICATION: Dexcom G7 Sensor  PHARMACY:    [id number] - [place], [place] - [address] (Ph: [phone])    HAS THE PATIENT CONTACTED THEIR PHARMACY?  Yes  LAST REFILL:  @@LASTREFILL @  IS THIS A 90 DAY SUPPLY : Yes  IS PATIENT OUT OF MEDICATION: Yes  IF NOT; HOW MUCH IS LEFT:   LAST APPOINTMENT DATE: @8 /03/2024  NEXT APPOINTMENT DATE:@1 /23/2026  DO WE HAVE YOUR PERMISSION TO LEAVE A DETAILED MESSAGE?:Yes  OTHER COMMENTS:    **Let patient know to contact pharmacy at the end of the day to make sure medication is ready. **  ** Please notify patient to allow 48-72 hours to process**  **Encourage patient to contact the pharmacy for refills or they can request refills through [facility]**

## 2024-06-13 NOTE — Telephone Encounter (Signed)
 Requested Prescriptions   Signed Prescriptions Disp Refills   Continuous Glucose Sensor (DEXCOM G7 SENSOR) MISC 9 each 3    Sig: Use to check glucose continuously, change sensor every 10 days    Authorizing Provider: Carlus Pavlov    Ordering User: Pollie Meyer

## 2024-06-14 ENCOUNTER — Telehealth: Payer: Self-pay

## 2024-06-14 ENCOUNTER — Other Ambulatory Visit (HOSPITAL_COMMUNITY): Payer: Self-pay

## 2024-06-14 NOTE — Telephone Encounter (Signed)
 Pt needs a PA for Novolin R

## 2024-06-14 NOTE — Telephone Encounter (Signed)
 Pharmacy Patient Advocate Encounter  Received notification from [facility] that Prior Authorization for Ozempic  (0.25 or 0.5 MG/DOSE) 2MG /3ML pen-injectors  has been APPROVED from 06/12/24 to 06/12/25. Ran test claim, Copay is $952.03. This test claim was processed through [facility]- copay amounts may vary at other pharmacies due to pharmacy/plan contracts, or as the patient moves through the different stages of their insurance plan.   PA #/Case ID/Reference #: [id number]

## 2024-06-15 ENCOUNTER — Ambulatory Visit: Admitting: Internal Medicine

## 2024-06-15 ENCOUNTER — Other Ambulatory Visit: Payer: Self-pay | Admitting: Family Medicine

## 2024-06-15 ENCOUNTER — Ambulatory Visit: Payer: Self-pay

## 2024-06-15 DIAGNOSIS — F418 Other specified anxiety disorders: Secondary | ICD-10-CM
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
## 2024-06-15 NOTE — Telephone Encounter (Signed)
 Left vm to rtn call to patient's wife,  Olam.  Scheduled him for 06/20/24 @10 :00 am.   Will call them back to make sure they cn make that appointment.  Dm/cma

## 2024-06-15 NOTE — Telephone Encounter (Signed)
 FYI Only or Action Required?: Action required by provider: request for appointment, clinical question for provider, and update on patient condition.  Patient was last seen in primary care on 02/28/2024 by Thedora Garnette HERO, MD.  Called Nurse Triage reporting Depression.  Symptoms began several weeks ago.  Interventions attempted: Rest, hydration, or home remedies.  Symptoms are: gradually worsening.  Triage Disposition: See Physician Within 24 Hours  Patient/caregiver understands and will follow disposition?: No, wishes to speak with PCP       Reason for Disposition  [1] Depression AND [2] getting worse (e.g., sleeping poorly, less able to do activities of daily living)  Answer Assessment - Initial Assessment Questions Pt wife Olam calling on behalf of pt per his request, to make appt with Dr. Thedora specifically. This RN recommended pt be examined in next 24 hours, pt wife refusing disposition, requesting earlier appt with Dr. Thedora, preferably next week, previously scheduled for 2/12, this RN placed appt on wait list. Advised pt call back or seek immediate care in ED if any new or worsening symptoms. Advised look into counseling services within insurance website, which wife confirms they've been looking into but wife states that pt has to be ready to talk to do so. Sending message to PCP office for call back to pt wife with further recommendations.     1. CONCERN: What happened that made you call today?     Per wife, pt been crying a lot lately, having big mood swing issues, got a lot of anxiety, agitation, sometimes he'll lash out verbally and yell at everybody Wife thinking may have mood balance issues, think may have mild depression, thinks may need blood work and may need medication, taken xanax  a long time ago  3. RISK OF HARM - SUICIDAL IDEATION:  Do you ever have thoughts of hurting or killing yourself?  (e.g., yes, no, no but preoccupation with thoughts about death)      Denies  4. RISK OF HARM - HOMICIDAL IDEATION:  Do you ever have thoughts of hurting or killing someone else?  (e.g., yes, no, no but preoccupation with thoughts about death)     Denies  5. FUNCTIONAL IMPAIRMENT: How have things been going for you overall? Have you had more difficulty than usual doing your normal daily activities?  (e.g., better, same, worse; self-care, school, work, interactions)     Still doing his normal activities  6. SUPPORT: Who is with you now? Who do you live with? Do you have family or friends who you can talk to?      Family  7. THERAPIST: Do you have a counselor or therapist? If Yes, ask: What is their name?     Not at this time  8. STRESSORS: Has there been any new stress or recent changes in your life?     Lots of change lately - he's retired and wife just recently retired and that kicked them off of their insurance plan Wife thinking pt may be overwhelmed, some of it aging, losing friends, male menopause with potential hormone changes, short fuse issues, some changes in his body Sometimes agitation related to his blood sugar, sometimes not, not sure if related to hearing loss, may be testosterone  9. ALCOHOL USE OR SUBSTANCE USE (DRUG USE): Do you drink alcohol or use any illegal drugs?     Denies  10. OTHER: Do you have any other physical symptoms right now? (e.g., fever)       Back pain ongoing, went  and had epidural shot at beginning of January, not as effective this time  Denies: Suicidal thoughts or attempts Threats of harm to self or others Violent behavior Confusion/slurred speech Very strange/paranoid behavior Substance use  Protocols used: Depression-A-AH

## 2024-06-18 ENCOUNTER — Telehealth: Payer: Self-pay

## 2024-06-18 ENCOUNTER — Other Ambulatory Visit (HOSPITAL_COMMUNITY): Payer: Self-pay

## 2024-06-18 NOTE — Telephone Encounter (Signed)
 Pharmacy Patient Advocate Encounter   Received notification from Pt Calls Messages that prior authorization for Novolin R is required/requested.   Insurance verification completed.   The patient is insured through [employer].   Per test claim: The current 30 day co-pay is, $0.  No PA needed at this time. This test claim was processed through [facility]- copay amounts may vary at other pharmacies due to pharmacy/plan contracts, or as the patient moves through the different stages of their insurance plan.     Insurance covers a max 30 days per fill

## 2024-06-19 ENCOUNTER — Ambulatory Visit: Admitting: Internal Medicine

## 2024-06-19 MED ORDER — INSULIN REGULAR HUMAN 100 UNIT/ML IJ SOLN: 20.0000 [IU] | Freq: Three times a day (TID) | INTRAMUSCULAR | 1 refills | Status: AC

## 2024-06-20 ENCOUNTER — Other Ambulatory Visit: Payer: Self-pay | Admitting: Internal Medicine

## 2024-06-20 ENCOUNTER — Ambulatory Visit: Payer: Self-pay | Admitting: Family Medicine

## 2024-06-20 ENCOUNTER — Encounter: Payer: Self-pay | Admitting: Family Medicine

## 2024-06-20 VITALS — BP 130/72 | HR 82 | Temp 98.9°F | Ht 70.0 in | Wt 206.0 lb

## 2024-06-20 DIAGNOSIS — Z794 Long term (current) use of insulin: Secondary | ICD-10-CM

## 2024-06-20 DIAGNOSIS — E1142 Type 2 diabetes mellitus with diabetic polyneuropathy: Secondary | ICD-10-CM | POA: Diagnosis not present

## 2024-06-20 DIAGNOSIS — Z6836 Body mass index (BMI) 36.0-36.9, adult: Secondary | ICD-10-CM | POA: Diagnosis not present

## 2024-06-20 DIAGNOSIS — E66812 Obesity, class 2: Secondary | ICD-10-CM | POA: Diagnosis not present

## 2024-06-20 DIAGNOSIS — N529 Male erectile dysfunction, unspecified: Secondary | ICD-10-CM | POA: Diagnosis not present

## 2024-06-20 DIAGNOSIS — Z7985 Long-term (current) use of injectable non-insulin antidiabetic drugs: Secondary | ICD-10-CM

## 2024-06-20 DIAGNOSIS — I10 Essential (primary) hypertension: Secondary | ICD-10-CM | POA: Diagnosis not present

## 2024-06-20 DIAGNOSIS — F320 Major depressive disorder, single episode, mild: Secondary | ICD-10-CM | POA: Insufficient documentation

## 2024-06-20 DIAGNOSIS — L821 Other seborrheic keratosis: Secondary | ICD-10-CM | POA: Diagnosis not present

## 2024-06-20 DIAGNOSIS — E782 Mixed hyperlipidemia: Secondary | ICD-10-CM | POA: Diagnosis not present

## 2024-06-20 LAB — GLUCOSE, RANDOM: Glucose, Bld: 215 mg/dL — ABNORMAL HIGH (ref 70–99)

## 2024-06-20 LAB — HEMOGLOBIN A1C: Hgb A1c MFr Bld: 10.3 % — ABNORMAL HIGH (ref 4.6–6.5)

## 2024-06-20 MED ORDER — SILDENAFIL CITRATE 100 MG PO TABS: 50.0000 mg | ORAL_TABLET | Freq: Every day | ORAL | 11 refills | Status: AC | PRN

## 2024-06-20 MED ORDER — SERTRALINE HCL 50 MG PO TABS: 50.0000 mg | ORAL_TABLET | Freq: Every day | ORAL | 3 refills | Status: AC

## 2024-06-20 NOTE — Assessment & Plan Note (Signed)
 Maximum weight: 230 lbs (04/2023) Current weight: 206 lbs Weight change since last visit: - 7 lbs Total weight loss: - 24 lbs (10.4 %)  Continue semaglutide  (Ozempic ). I agree with the step up to the 1 mg weekly dose.

## 2024-06-20 NOTE — Assessment & Plan Note (Signed)
 Recommend we start an SSRI for 6 months, as this may be more of an adjustment reaction. I will start sertraline  50 mg daily.

## 2024-06-20 NOTE — Assessment & Plan Note (Signed)
 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 I advised Raymond Pugh that these are benign. He could see a dermatologist for cryotherapy if desired.

## 2024-06-20 NOTE — Progress Notes (Signed)
 " [facility] [address] [place] [address] Dept: [phone] Dept Fax: [phone]  Chronic Care Office Visit  Subjective:    Patient ID: Raymond Pugh, male    DOB: [birth year], 64 y.o..   MRN: [id number]  Chief Complaint  Patient presents with   Hypertension    3 month f/u HTN.    C/o being anxious/irritable.      History of Present Illness:  Patient is in today for reassessment of chronic medical conditions.   Raymond Pugh has a history of Type 2 diabetes. He is followed by Dr. Trixie. His diabetes has been complicated by peripheral neuropathy and a right 2nd toe amputation in December 2024. He is currently managed on metformin  1,000 mg bid, insulin  glargine (Lantus ) 40 units at bedtime, Novulin R 20-30 units before meals, semaglutide  (Ozempic ) 0.5 mg weekly, and dapaglilflozin 10 mg daily. Although the dapagliflozin  was prescribed in August, Raymond Pugh is not sure he has been taking this.  He notes he will be stepping up to 1 mg weekly of the Ozempic  with his next dose.   Raymond Pugh has a history of hypertension. He is managed on HCTZ 25 mg daily, metoprolol  tartrate 100 mg bid, and ramipril  10 mg 2 tabs daily.   Raymond Pugh has hyperlipidemia. He is managed on lovastatin  20 mg daily.  Raymond Pugh notes he has been having increased irritability.  He notes his wife has been concerned about this. He does have a past history of situational anxiety and he occasionally takes alprazolam  for this. He feels he may be depressed due tot he life change that comes with retiring from work. 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  Raymond Pugh notes he has some dry skin patches on his back. These are not necessarily itchy or irritating.  Raymond Pugh notes a progressive issue with poor erections. He asks about a trial on Viagra , but is concerned about the cost of this.  Past  Medical History: Patient Active Problem List   Diagnosis Date Noted   Situational anxiety 02/28/2024   Spinal stenosis of lumbar region 11/28/2023   History of amputation of right second toe 08/25/2023   Diabetic peripheral neuropathy (HCC) 08/25/2023   Seasonal allergic rhinitis 08/25/2023   Degeneration of intervertebral disc of lumbar region with discogenic back pain 08/25/2023   Type 2 diabetes mellitus with peripheral neuropathy (HCC) 05/20/2023   Nodule of upper lobe of left lung 05/11/2021   Overweight (BMI 25.0-29.9) 05/29/2019   Liver abscess 06/24/2014   Class 1 obesity due to excess calories with serious comorbidity and body mass index (BMI) of 30.0 to 30.9 in adult 08/25/2010   Barrett's esophagus 07/03/2010   Hyperlipidemia 11/04/2008   Attention deficit hyperactivity disorder (ADHD) 08/14/2007   Essential hypertension 11/03/2006   Past Surgical History:  Procedure Laterality Date   ABSCESS DRAIN LIVER PERC ([facility] HX)     klebsiella on liver pa states    AMPUTATION TOE Right 05/23/2023   Procedure: AMPUTATION TOE;  Surgeon: Malvin Marsa FALCON, DPM;  Location: [facility] OR;  Service: Orthopedics/Podiatry;  Laterality: Right;  Right 2nd toe amputation at MPJ level   BRONCHIAL BIOPSY  06/09/2021   Procedure: BRONCHIAL BIOPSIES;  Surgeon: Brenna Adine CROME, DO;  Location: [facility] ENDOSCOPY;  Service: Pulmonary;;   BRONCHIAL NEEDLE ASPIRATION BIOPSY  06/09/2021   Procedure: BRONCHIAL NEEDLE ASPIRATION BIOPSIES;  Surgeon: Brenna Adine CROME, DO;  Location:  [facility];  Service: Pulmonary;;   BRONCHIAL WASHINGS  06/09/2021   Procedure: BRONCHIAL WASHINGS;  Surgeon: Brenna Adine CROME, DO;  Location: [facility];  Service: Pulmonary;;   CARDIAC CATHETERIZATION     CATARACT EXTRACTION W/PHACO Left 03/09/2022   Procedure: CATARACT EXTRACTION PHACO AND INTRAOCULAR LENS PLACEMENT (IOC) LEFT DIABETIC 5.16 00:36.6;  Surgeon: Jaye Fallow, MD;  Location[facility];  Service:  Ophthalmology;  Laterality: Left;  Diabetic   EYE SURGERY     HYDROCELE EXCISION / REPAIR     RADIAL KERATOTOMY     RETINAL DETACHMENT SURGERY     VIDEO BRONCHOSCOPY WITH RADIAL ENDOBRONCHIAL ULTRASOUND  06/09/2021   Procedure: VIDEO BRONCHOSCOPY WITH RADIAL ENDOBRONCHIAL ULTRASOUND;  Surgeon: Brenna Adine CROME, DO;  Location: [facility];  Service: Pulmonary;;   Family History  Problem Relation Age of Onset   Hypertension Mother    Asthma Mother    Cancer Father        Bone   Diabetes Father    Hypertension Father    Colon polyps Father    Diabetes Paternal Grandfather    ADD / ADHD Sister    ADD / ADHD Brother    Prostate cancer Neg Hx    Outpatient Medications Prior to Visit  Medication Sig Dispense Refill   acetaminophen  (TYLENOL ) 325 MG tablet Take 2 tablets (650 mg total) by mouth every 6 (six) hours as needed for mild pain (pain score 1-3) (or Fever >/= 101).     ALPRAZolam  (XANAX ) 0.5 MG tablet TAKE 1 TABLET BY MOUTH 2 TIMES DAILY AS NEEDED FOR ANXIETY. 20 tablet 2   ascorbic acid  (VITAMIN C ) 500 MG tablet Take 1 tablet (500 mg total) by mouth daily.     Continuous Glucose Receiver (FREESTYLE LIBRE 3 READER) DEVI Use to monitor glucose continuously 1 each 0   Continuous Glucose Sensor (DEXCOM G7 SENSOR) MISC Use to check glucose continuously, change sensor every 10 days 9 each 3   Continuous Glucose Transmitter (DEXCOM G6 TRANSMITTER) MISC USE AS DIRECTED AND CHANGE EVERY 90 DAYS 1 each 0   cyclobenzaprine  (FLEXERIL ) 5 MG tablet Take 1 tablet (5 mg total) by mouth at bedtime as needed for muscle spasms. 30 tablet 0   dapagliflozin  propanediol (FARXIGA ) 10 MG TABS tablet Take 1 tablet (10 mg total) by mouth daily before breakfast. 90 tablet 1   diclofenac  Sodium (VOLTAREN ) 1 % GEL APPLY 2 G TOPICALLY 4 (FOUR) TIMES DAILY. TO AFFECTED JOINT. 100 g 5   gabapentin  (NEURONTIN ) 100 MG capsule TAKE 1 CAPSULE(100 MG) BY MOUTH TWICE DAILY 180 capsule 1   glucose blood test strip Use  to check blood sugar 3 times a day 300 each 5   hydrochlorothiazide  (HYDRODIURIL ) 25 MG tablet TAKE 1 TABLET(25 MG) BY MOUTH DAILY 90 tablet 1   insulin  glargine (LANTUS ) 100 unit/mL SOPN Inject 40 Units into the skin daily. 15 mL 11   insulin  regular (NOVOLIN R) 100 units/mL injection Inject 0.2-0.3 mLs (20-30 Units total) into the skin 3 (three) times daily before meals. 30 mL 1   Insulin  Syringe-Needle U-100 (RELION INSULIN  SYRINGE) 31G X 15/64 0.5 ML MISC Use to inject insulin  3 times a day. NO REFILLS WITHOUT APPOINTMENT 300 each 0   Lancets (ONETOUCH ULTRASOFT) lancets Use to check blood sugar 3 times a day 100 each 0   lovastatin  (MEVACOR ) 20 MG tablet Take 1 tablet (20 mg total) by mouth at bedtime. TAKE 1 TABLET(20 MG) BY MOUTH AT BEDTIME 90 tablet  1   meloxicam  (MOBIC ) 15 MG tablet Take 1 tablet (15 mg total) by mouth daily as needed for pain. 30 tablet 0   metFORMIN  (GLUCOPHAGE ) 1000 MG tablet Take 1 tablet (1,000 mg total) by mouth 2 (two) times daily. 180 tablet 3   metoprolol  tartrate (LOPRESSOR ) 100 MG tablet TAKE 1 TABLET(100 MG) BY MOUTH TWICE DAILY 180 tablet 1   omeprazole  (PRILOSEC) 20 MG capsule TAKE 1 CAPSULE BY MOUTH IN THE MORNING 90 capsule 3   ondansetron  (ZOFRAN ) 4 MG tablet Take 1 tablet (4 mg total) by mouth every 8 (eight) hours as needed for nausea or vomiting. 16 tablet 0   ramipril  (ALTACE ) 10 MG capsule Take 2 capsules (20 mg total) by mouth daily. 180 capsule 3   Semaglutide ,0.25 or 0.5MG /DOS, (OZEMPIC , 0.25 OR 0.5 MG/DOSE,) 2 MG/3ML SOPN Inject 0.5 mg into the skin once a week. 9 mL 3   tiZANidine  (ZANAFLEX ) 4 MG tablet Take 1 tablet (4 mg total) by mouth at bedtime as needed. 30 tablet 0   zinc  sulfate, 50mg  elemental zinc , 220 (50 Zn) MG capsule Take 1 capsule (220 mg total) by mouth daily.     Facility-Administered Medications Prior to Visit  Medication Dose Route Frequency Provider Last Rate Last Admin   diclofenac  Sodium (VOLTAREN ) 1 % topical gel 2 g  2 g  Topical BID PRN Leonce Katz, DO       diclofenac  Sodium (VOLTAREN ) 1 % topical gel 2 g  2 g Topical Daily PRN Leonce Katz, DO       Allergies[1]   Objective:   Today's Vitals   06/20/24 0953  BP: 130/72  Pulse: 82  Temp: 98.9 F (37.2 C)  TempSrc: Temporal  SpO2: 98%  Weight: 206 lb (93.4 kg)  Height: 5' 10 (1.778 m)   Body mass index is 29.56 kg/m.   General: Well developed, well nourished. No acute distress. Skin: Warm and dry. There are multiple flesh colored oval plaques on the back. Some of these are slightly scaly. Psych: Alert and oriented. Normal mood and affect.  Health Maintenance Due  Topic Date Due   OPHTHALMOLOGY EXAM  06/30/2019   Lab Results  Lab Results  Component Value Date   HGBA1C 9.6 (H) 02/28/2024   HGBA1C 9.8 (H) 12/01/2023   HGBA1C 9.9 (H) 09/06/2023   Assessment & Plan:   Problem List Items Addressed This Visit       Cardiovascular and Mediastinum   Essential hypertension - Primary   BP is at goal today. Continue  HCTZ 25 mg daily, metoprolol  tartrate 100 mg bid, and ramipril  10 mg, 2 tablets daily.      Relevant Medications   sildenafil  (VIAGRA ) 100 MG tablet     Endocrine   Diabetic peripheral neuropathy (HCC)   Continue focus on blood glucose control and daily foot care.      Relevant Medications   sertraline  (ZOLOFT ) 50 MG tablet   Type 2 diabetes mellitus with peripheral neuropathy (HCC)   Diabetes has been uncontrolled. Weight is down 17 lbs since August, likely due to Ozempic  use. Last A1c was 9.6%. I will check an A1c today. He will continue to follow with Dr. Trixie. He was to have had an appointment yesterday, but this was rescheduled due to weather issues. Continue metformin  1,000 mg bid, insulin  glargine (Lantus ) 40 units at bedtime, Novulin R on a sliding scale, and semaglutide  (Ozempic ) 0.5 mg weekly (increasing to 1 mg weekly with his next dose).  Relevant Medications   sertraline  (ZOLOFT ) 50 MG  tablet   Other Relevant Orders   Glucose, random   Hemoglobin A1c     Musculoskeletal and Integument   Seborrheic keratoses   Lesions ont he back are consistent with SKs. I advised Raymond Pugh that these are benign. He could see a dermatologist for cryotherapy if desired.        Other   Class 2 obesity- Max BMI= 36.01   Maximum weight: 230 lbs (04/2023) Current weight: 206 lbs Weight change since last visit: - 7 lbs Total weight loss: - 24 lbs (10.4 %)  Continue semaglutide  (Ozempic ). I agree with the step up to the 1 mg weekly dose.      Depression, major, single episode, mild   Recommend we start an SSRI for 6 months, as this may be more of an adjustment reaction. I will start sertraline  50 mg daily.      Relevant Medications   sertraline  (ZOLOFT ) 50 MG tablet   Erectile dysfunction   We will give a trial of sildenafil  50-100 mg as needed. I provided Raymond Pugh with a Good Rx card, which should keep his monthly cost reasonable.      Relevant Medications   sildenafil  (VIAGRA ) 100 MG tablet   Hyperlipidemia   Lipids are at goal. Continue lovastatin  20 mg daily.      Relevant Medications   sildenafil  (VIAGRA ) 100 MG tablet   Other Visit Diagnoses       Long-term current use of injectable noninsulin antidiabetic medication         Encounter for long-term (current) insulin  use (HCC)           Return in about 3 months (around 09/18/2024) for Reassessment.   Garnette CHRISTELLA Simpler, MD  I,Emily Lagle,acting as a scribe for Garnette CHRISTELLA Simpler, MD.,have documented all relevant documentation on the behalf of Garnette CHRISTELLA Simpler, MD.  I, Garnette CHRISTELLA Simpler, MD, have reviewed all documentation for this visit. The documentation on 06/20/2024 for the exam, diagnosis, procedures, and orders are all accurate and complete.      [1]  Allergies Allergen Reactions   Hydrocodone  Nausea Only   Yellow Dyes 6, 10, And 11 Hives and Itching   "

## 2024-06-20 NOTE — Assessment & Plan Note (Addendum)
 Diabetes has been uncontrolled. Weight is down 17 lbs since August, likely due to Ozempic  use. Last A1c was 9.6%. I will check an A1c today. He will continue to follow with Dr. Trixie. He was to have had an appointment yesterday, but this was rescheduled due to weather issues. Continue metformin  1,000 mg bid, insulin  glargine (Lantus ) 40 units at bedtime, Novulin R on a sliding scale, and semaglutide  (Ozempic ) 0.5 mg weekly (increasing to 1 mg weekly with his next dose).

## 2024-06-20 NOTE — Assessment & Plan Note (Signed)
 BP is at goal today. Continue  HCTZ 25 mg daily, metoprolol  tartrate 100 mg bid, and ramipril  10 mg, 2 tablets daily.

## 2024-06-20 NOTE — Assessment & Plan Note (Addendum)
 Continue focus on blood glucose control and daily foot care.

## 2024-06-20 NOTE — Assessment & Plan Note (Signed)
 We will give a trial of sildenafil  50-100 mg as needed. I provided Raymond Pugh with a Good Rx card, which should keep his monthly cost reasonable.

## 2024-06-20 NOTE — Assessment & Plan Note (Signed)
 Lipids are at goal. Continue lovastatin  20 mg daily.

## 2024-06-21 ENCOUNTER — Other Ambulatory Visit: Payer: Self-pay | Admitting: Sports Medicine

## 2024-06-21 DIAGNOSIS — M51362 Other intervertebral disc degeneration, lumbar region with discogenic back pain and lower extremity pain: Secondary | ICD-10-CM

## 2024-06-21 DIAGNOSIS — M25552 Pain in left hip: Secondary | ICD-10-CM

## 2024-06-21 DIAGNOSIS — G8929 Other chronic pain: Secondary | ICD-10-CM

## 2024-06-21 MED ORDER — MELOXICAM 15 MG PO TABS: 15.0000 mg | ORAL_TABLET | Freq: Every day | ORAL | 0 refills | Status: AC | PRN

## 2024-06-21 MED ORDER — CYCLOBENZAPRINE HCL 5 MG PO TABS: 5.0000 mg | ORAL_TABLET | Freq: Every evening | ORAL | 0 refills | Status: AC | PRN

## 2024-06-21 NOTE — Telephone Encounter (Signed)
 Refilled medication and placed referral. Messaged patient via mychart

## 2024-06-21 NOTE — Progress Notes (Signed)
 Okay to refill meloxicam  and Flexeril  medications. Okay to refer to neurosurgery. Patient may follow-up with us  as needed.

## 2024-06-22 NOTE — Telephone Encounter (Signed)
 Messaged patient via mychart

## 2024-06-29 ENCOUNTER — Ambulatory Visit: Admitting: Internal Medicine

## 2024-06-29 ENCOUNTER — Encounter: Payer: Self-pay | Admitting: Internal Medicine

## 2024-07-02 ENCOUNTER — Ambulatory Visit: Admitting: Internal Medicine

## 2024-07-05 ENCOUNTER — Ambulatory Visit: Payer: Self-pay | Admitting: Family Medicine

## 2024-08-28 ENCOUNTER — Ambulatory Visit: Admitting: Family Medicine

## 2024-09-18 ENCOUNTER — Ambulatory Visit: Admitting: Family Medicine
